# Patient Record
Sex: Female | Born: 1970 | Race: Black or African American | Hispanic: No | Smoking: Never smoker
Health system: Southern US, Community
[De-identification: ages and names within clinical notes are randomized; demographics above are authoritative.]

## PROBLEM LIST (undated history)

## (undated) DIAGNOSIS — Z889 Allergy status to unspecified drugs, medicaments and biological substances status: Secondary | ICD-10-CM

## (undated) DIAGNOSIS — F4310 Post-traumatic stress disorder, unspecified: Secondary | ICD-10-CM

## (undated) DIAGNOSIS — G709 Myoneural disorder, unspecified: Secondary | ICD-10-CM

## (undated) DIAGNOSIS — E785 Hyperlipidemia, unspecified: Secondary | ICD-10-CM

## (undated) DIAGNOSIS — D259 Leiomyoma of uterus, unspecified: Secondary | ICD-10-CM

## (undated) DIAGNOSIS — R011 Cardiac murmur, unspecified: Secondary | ICD-10-CM

## (undated) DIAGNOSIS — G43909 Migraine, unspecified, not intractable, without status migrainosus: Secondary | ICD-10-CM

## (undated) DIAGNOSIS — R569 Unspecified convulsions: Secondary | ICD-10-CM

## (undated) DIAGNOSIS — S322XXA Fracture of coccyx, initial encounter for closed fracture: Secondary | ICD-10-CM

## (undated) DIAGNOSIS — F329 Major depressive disorder, single episode, unspecified: Secondary | ICD-10-CM

## (undated) DIAGNOSIS — F419 Anxiety disorder, unspecified: Secondary | ICD-10-CM

## (undated) DIAGNOSIS — D649 Anemia, unspecified: Secondary | ICD-10-CM

## (undated) DIAGNOSIS — F32A Depression, unspecified: Secondary | ICD-10-CM

## (undated) DIAGNOSIS — N938 Other specified abnormal uterine and vaginal bleeding: Secondary | ICD-10-CM

## (undated) DIAGNOSIS — M549 Dorsalgia, unspecified: Secondary | ICD-10-CM

## (undated) HISTORY — DX: Cardiac murmur, unspecified: R01.1

## (undated) HISTORY — PX: OTHER SURGICAL HISTORY: SHX169

## (undated) HISTORY — DX: Other specified abnormal uterine and vaginal bleeding: N93.8

## (undated) HISTORY — PX: TUBAL LIGATION: SHX77

## (undated) HISTORY — PX: LAPAROTOMY: SHX154

## (undated) HISTORY — DX: Migraine, unspecified, not intractable, without status migrainosus: G43.909

## (undated) HISTORY — DX: Hyperlipidemia, unspecified: E78.5

## (undated) HISTORY — PX: WISDOM TOOTH EXTRACTION: SHX21

## (undated) HISTORY — PX: HERNIA REPAIR: SHX51

## (undated) HISTORY — DX: Leiomyoma of uterus, unspecified: D25.9

## (undated) HISTORY — DX: Depression, unspecified: F32.A

## (undated) HISTORY — PX: CHOLECYSTECTOMY: SHX55

## (undated) HISTORY — DX: Anemia, unspecified: D64.9

## (undated) HISTORY — DX: Major depressive disorder, single episode, unspecified: F32.9

---

## 2002-04-16 DIAGNOSIS — R569 Unspecified convulsions: Secondary | ICD-10-CM

## 2002-04-16 HISTORY — DX: Unspecified convulsions: R56.9

## 2006-10-25 ENCOUNTER — Emergency Department (HOSPITAL_COMMUNITY): Admission: EM | Admit: 2006-10-25 | Discharge: 2006-10-25 | Payer: Self-pay | Admitting: Emergency Medicine

## 2007-01-07 ENCOUNTER — Emergency Department (HOSPITAL_COMMUNITY): Admission: EM | Admit: 2007-01-07 | Discharge: 2007-01-07 | Payer: Self-pay | Admitting: Emergency Medicine

## 2007-01-20 ENCOUNTER — Emergency Department (HOSPITAL_COMMUNITY): Admission: EM | Admit: 2007-01-20 | Discharge: 2007-01-20 | Payer: Self-pay | Admitting: Emergency Medicine

## 2007-02-01 ENCOUNTER — Emergency Department (HOSPITAL_COMMUNITY): Admission: EM | Admit: 2007-02-01 | Discharge: 2007-02-01 | Payer: Self-pay | Admitting: Emergency Medicine

## 2007-05-24 ENCOUNTER — Emergency Department (HOSPITAL_COMMUNITY): Admission: EM | Admit: 2007-05-24 | Discharge: 2007-05-25 | Payer: Self-pay | Admitting: Emergency Medicine

## 2007-05-25 ENCOUNTER — Emergency Department (HOSPITAL_COMMUNITY): Admission: EM | Admit: 2007-05-25 | Discharge: 2007-05-25 | Payer: Self-pay | Admitting: Family Medicine

## 2007-05-31 ENCOUNTER — Emergency Department (HOSPITAL_COMMUNITY): Admission: EM | Admit: 2007-05-31 | Discharge: 2007-05-31 | Payer: Self-pay | Admitting: Emergency Medicine

## 2008-10-22 ENCOUNTER — Emergency Department (HOSPITAL_COMMUNITY): Admission: EM | Admit: 2008-10-22 | Discharge: 2008-10-22 | Payer: Self-pay | Admitting: Emergency Medicine

## 2009-01-17 ENCOUNTER — Emergency Department (HOSPITAL_COMMUNITY): Admission: EM | Admit: 2009-01-17 | Discharge: 2009-01-17 | Payer: Self-pay | Admitting: Family Medicine

## 2009-02-27 ENCOUNTER — Emergency Department (HOSPITAL_COMMUNITY): Admission: EM | Admit: 2009-02-27 | Discharge: 2009-02-27 | Payer: Self-pay | Admitting: Emergency Medicine

## 2009-03-02 ENCOUNTER — Encounter: Admission: RE | Admit: 2009-03-02 | Discharge: 2009-03-02 | Payer: Self-pay | Admitting: Emergency Medicine

## 2009-05-25 ENCOUNTER — Encounter (INDEPENDENT_AMBULATORY_CARE_PROVIDER_SITE_OTHER): Payer: Self-pay | Admitting: Family Medicine

## 2009-05-25 ENCOUNTER — Ambulatory Visit: Payer: Self-pay | Admitting: Internal Medicine

## 2009-05-25 LAB — CONVERTED CEMR LAB
ALT: 11 units/L (ref 0–35)
AST: 14 units/L (ref 0–37)
Albumin: 4.5 g/dL (ref 3.5–5.2)
Alkaline Phosphatase: 51 units/L (ref 39–117)
BUN: 5 mg/dL — ABNORMAL LOW (ref 6–23)
Basophils Absolute: 0 10*3/uL (ref 0.0–0.1)
Basophils Relative: 0 % (ref 0–1)
CO2: 20 meq/L (ref 19–32)
Calcium: 9.4 mg/dL (ref 8.4–10.5)
Chloride: 104 meq/L (ref 96–112)
Cholesterol: 221 mg/dL — ABNORMAL HIGH (ref 0–200)
Creatinine, Ser: 0.72 mg/dL (ref 0.40–1.20)
Eosinophils Absolute: 0 10*3/uL (ref 0.0–0.7)
Eosinophils Relative: 1 % (ref 0–5)
Glucose, Bld: 87 mg/dL (ref 70–99)
HCT: 36.3 % (ref 36.0–46.0)
HDL: 45 mg/dL (ref >39)
Hemoglobin: 11.8 g/dL — ABNORMAL LOW (ref 12.0–15.0)
LDL Cholesterol: 158 mg/dL — ABNORMAL HIGH (ref 0–99)
Lymphocytes Relative: 39 % (ref 12–46)
Lymphs Abs: 1.6 10*3/uL (ref 0.7–4.0)
MCHC: 32.5 g/dL (ref 30.0–36.0)
MCV: 85.8 fL (ref 78.0–100.0)
Monocytes Absolute: 0.3 10*3/uL (ref 0.1–1.0)
Monocytes Relative: 6 % (ref 3–12)
Neutro Abs: 2.1 10*3/uL (ref 1.7–7.7)
Neutrophils Relative %: 54 % (ref 43–77)
Platelets: 293 10*3/uL (ref 150–400)
Potassium: 4.2 meq/L (ref 3.5–5.3)
RBC: 4.23 M/uL (ref 3.87–5.11)
RDW: 13.5 % (ref 11.5–15.5)
Sodium: 139 meq/L (ref 135–145)
Total Bilirubin: 0.4 mg/dL (ref 0.3–1.2)
Total CHOL/HDL Ratio: 4.9
Total Protein: 7.3 g/dL (ref 6.0–8.3)
Triglycerides: 92 mg/dL (ref <150)
VLDL: 18 mg/dL (ref 0–40)
Vit D, 25-Hydroxy: 12 ng/mL — ABNORMAL LOW (ref 30–89)
WBC: 4 10*3/uL (ref 4.0–10.5)

## 2009-06-14 ENCOUNTER — Ambulatory Visit: Payer: Self-pay | Admitting: Internal Medicine

## 2009-07-29 ENCOUNTER — Encounter (INDEPENDENT_AMBULATORY_CARE_PROVIDER_SITE_OTHER): Payer: Self-pay | Admitting: Family Medicine

## 2009-07-29 ENCOUNTER — Other Ambulatory Visit: Admission: RE | Admit: 2009-07-29 | Discharge: 2009-07-29 | Payer: Self-pay | Admitting: Family Medicine

## 2009-07-29 ENCOUNTER — Ambulatory Visit: Payer: Self-pay | Admitting: Internal Medicine

## 2009-07-29 LAB — CONVERTED CEMR LAB
Ferritin: 46 ng/mL (ref 10–291)
Iron: 121 ug/dL (ref 42–145)
Saturation Ratios: 27 % (ref 20–55)
TIBC: 441 ug/dL (ref 250–470)
TSH: 2.104 microintl units/mL (ref 0.350–4.500)
UIBC: 320 ug/dL

## 2009-11-11 ENCOUNTER — Ambulatory Visit: Payer: Self-pay | Admitting: Internal Medicine

## 2010-02-12 ENCOUNTER — Emergency Department (HOSPITAL_COMMUNITY): Admission: EM | Admit: 2010-02-12 | Discharge: 2010-02-12 | Payer: Self-pay | Admitting: Family Medicine

## 2010-03-15 ENCOUNTER — Emergency Department (HOSPITAL_COMMUNITY): Admission: EM | Admit: 2010-03-15 | Discharge: 2010-03-15 | Payer: Self-pay | Admitting: Family Medicine

## 2010-04-05 ENCOUNTER — Emergency Department (HOSPITAL_COMMUNITY)
Admission: EM | Admit: 2010-04-05 | Discharge: 2010-04-05 | Payer: Self-pay | Source: Home / Self Care | Admitting: Emergency Medicine

## 2010-05-02 ENCOUNTER — Emergency Department (HOSPITAL_COMMUNITY)
Admission: EM | Admit: 2010-05-02 | Discharge: 2010-05-02 | Payer: Self-pay | Source: Home / Self Care | Admitting: Emergency Medicine

## 2010-05-03 LAB — POCT URINALYSIS DIPSTICK
Bilirubin Urine: NEGATIVE
Hgb urine dipstick: NEGATIVE
Nitrite: NEGATIVE
Protein, ur: NEGATIVE mg/dL
Specific Gravity, Urine: 1.02 (ref 1.005–1.030)
Urine Glucose, Fasting: NEGATIVE mg/dL
Urobilinogen, UA: 1 mg/dL (ref 0.0–1.0)
pH: 6.5 (ref 5.0–8.0)

## 2010-05-03 LAB — WET PREP, GENITAL
Trich, Wet Prep: NONE SEEN
Yeast Wet Prep HPF POC: NONE SEEN

## 2010-05-03 LAB — POCT PREGNANCY, URINE: Preg Test, Ur: NEGATIVE

## 2010-05-07 ENCOUNTER — Encounter: Payer: Self-pay | Admitting: Emergency Medicine

## 2010-05-08 LAB — GC/CHLAMYDIA PROBE AMP, GENITAL
Chlamydia, DNA Probe: NEGATIVE
GC Probe Amp, Genital: NEGATIVE

## 2010-06-13 ENCOUNTER — Inpatient Hospital Stay (HOSPITAL_COMMUNITY)
Admission: AD | Admit: 2010-06-13 | Discharge: 2010-06-13 | Disposition: A | Discharge status: Home / Self Care | Payer: Medicaid Other | Source: Ambulatory Visit | Attending: Obstetrics & Gynecology | Admitting: Obstetrics & Gynecology

## 2010-06-13 DIAGNOSIS — R109 Unspecified abdominal pain: Secondary | ICD-10-CM | POA: Insufficient documentation

## 2010-06-13 DIAGNOSIS — M545 Low back pain, unspecified: Secondary | ICD-10-CM | POA: Insufficient documentation

## 2010-06-13 LAB — CBC
HCT: 39.1 % (ref 36.0–46.0)
Hemoglobin: 12.6 g/dL (ref 12.0–15.0)
MCH: 27.9 pg (ref 26.0–34.0)
MCHC: 32.2 g/dL (ref 30.0–36.0)
MCV: 86.7 fL (ref 78.0–100.0)
Platelets: 248 10*3/uL (ref 150–400)
RBC: 4.51 MIL/uL (ref 3.87–5.11)
RDW: 13.5 % (ref 11.5–15.5)
WBC: 6.2 10*3/uL (ref 4.0–10.5)

## 2010-06-13 LAB — WET PREP, GENITAL
Clue Cells Wet Prep HPF POC: NONE SEEN
Trich, Wet Prep: NONE SEEN
Yeast Wet Prep HPF POC: NONE SEEN

## 2010-06-13 LAB — ABO/RH: ABO/RH(D): O POS

## 2010-06-13 LAB — URINALYSIS, ROUTINE W REFLEX MICROSCOPIC
Bilirubin Urine: NEGATIVE
Hgb urine dipstick: NEGATIVE
Ketones, ur: NEGATIVE mg/dL
Nitrite: NEGATIVE
Protein, ur: NEGATIVE mg/dL
Specific Gravity, Urine: 1.02 (ref 1.005–1.030)
Urine Glucose, Fasting: NEGATIVE mg/dL
Urobilinogen, UA: 0.2 mg/dL (ref 0.0–1.0)
pH: 8 (ref 5.0–8.0)

## 2010-06-13 LAB — HCG, QUANTITATIVE, PREGNANCY: hCG, Beta Chain, Quant, S: <2 m[IU]/mL (ref <5)

## 2010-06-13 LAB — POCT PREGNANCY, URINE: Preg Test, Ur: NEGATIVE

## 2010-06-14 LAB — GC/CHLAMYDIA PROBE AMP, GENITAL
Chlamydia, DNA Probe: NEGATIVE
GC Probe Amp, Genital: NEGATIVE

## 2010-06-26 ENCOUNTER — Inpatient Hospital Stay (HOSPITAL_COMMUNITY): Payer: Medicaid Other

## 2010-06-26 ENCOUNTER — Inpatient Hospital Stay (HOSPITAL_COMMUNITY)
Admission: AD | Admit: 2010-06-26 | Discharge: 2010-06-26 | Disposition: A | Discharge status: Home / Self Care | Payer: Medicaid Other | Source: Ambulatory Visit | Attending: Family Medicine | Admitting: Family Medicine

## 2010-06-26 DIAGNOSIS — N390 Urinary tract infection, site not specified: Secondary | ICD-10-CM | POA: Insufficient documentation

## 2010-06-26 DIAGNOSIS — N938 Other specified abnormal uterine and vaginal bleeding: Secondary | ICD-10-CM

## 2010-06-26 DIAGNOSIS — N838 Other noninflammatory disorders of ovary, fallopian tube and broad ligament: Secondary | ICD-10-CM | POA: Insufficient documentation

## 2010-06-26 DIAGNOSIS — N949 Unspecified condition associated with female genital organs and menstrual cycle: Secondary | ICD-10-CM

## 2010-06-26 LAB — URINALYSIS, ROUTINE W REFLEX MICROSCOPIC
Bilirubin Urine: NEGATIVE
Bilirubin Urine: NEGATIVE
Glucose, UA: 100 mg/dL — AB
Glucose, UA: NEGATIVE mg/dL
Ketones, ur: 15 mg/dL — AB
Ketones, ur: NEGATIVE mg/dL
Leukocytes, UA: NEGATIVE
Nitrite: NEGATIVE
Nitrite: POSITIVE — AB
Protein, ur: >300 mg/dL — AB
Protein, ur: NEGATIVE mg/dL
Specific Gravity, Urine: 1.011 (ref 1.005–1.030)
Specific Gravity, Urine: 1.015 (ref 1.005–1.030)
Urobilinogen, UA: 0.2 mg/dL (ref 0.0–1.0)
Urobilinogen, UA: 4 mg/dL — ABNORMAL HIGH (ref 0.0–1.0)
pH: 7 (ref 5.0–8.0)
pH: 7.5 (ref 5.0–8.0)

## 2010-06-26 LAB — URINE MICROSCOPIC-ADD ON

## 2010-06-26 LAB — HCG, QUANTITATIVE, PREGNANCY: hCG, Beta Chain, Quant, S: <2 m[IU]/mL (ref <5)

## 2010-06-26 LAB — POCT PREGNANCY, URINE
Preg Test, Ur: NEGATIVE
Preg Test, Ur: NEGATIVE

## 2010-06-27 LAB — POCT URINALYSIS DIPSTICK
Bilirubin Urine: NEGATIVE
Glucose, UA: NEGATIVE mg/dL
Ketones, ur: NEGATIVE mg/dL
Nitrite: NEGATIVE
Protein, ur: NEGATIVE mg/dL
Specific Gravity, Urine: 1.02 (ref 1.005–1.030)
Urobilinogen, UA: 0.2 mg/dL (ref 0.0–1.0)
pH: 5.5 (ref 5.0–8.0)

## 2010-06-27 LAB — GC/CHLAMYDIA PROBE AMP, GENITAL
Chlamydia, DNA Probe: NEGATIVE
GC Probe Amp, Genital: NEGATIVE

## 2010-06-27 LAB — WET PREP, GENITAL
Clue Cells Wet Prep HPF POC: NONE SEEN
Trich, Wet Prep: NONE SEEN
Yeast Wet Prep HPF POC: NONE SEEN

## 2010-06-27 LAB — POCT PREGNANCY, URINE: Preg Test, Ur: NEGATIVE

## 2010-07-19 ENCOUNTER — Encounter: Payer: Medicaid Other | Admitting: Obstetrics and Gynecology

## 2010-07-19 LAB — POCT PREGNANCY, URINE: Preg Test, Ur: NEGATIVE

## 2010-07-23 LAB — POCT URINALYSIS DIP (DEVICE)
Bilirubin Urine: NEGATIVE
Glucose, UA: NEGATIVE mg/dL
Ketones, ur: NEGATIVE mg/dL
Nitrite: NEGATIVE
Protein, ur: NEGATIVE mg/dL
Specific Gravity, Urine: 1.01 (ref 1.005–1.030)
Urobilinogen, UA: 0.2 mg/dL (ref 0.0–1.0)
pH: 6 (ref 5.0–8.0)

## 2010-07-23 LAB — URINE CULTURE: Colony Count: >=100000

## 2010-08-03 ENCOUNTER — Encounter: Payer: Medicaid Other | Admitting: Obstetrics and Gynecology

## 2010-09-21 ENCOUNTER — Encounter: Payer: Medicaid Other | Admitting: Physician Assistant

## 2010-10-30 ENCOUNTER — Inpatient Hospital Stay (INDEPENDENT_AMBULATORY_CARE_PROVIDER_SITE_OTHER)
Admission: RE | Admit: 2010-10-30 | Discharge: 2010-10-30 | Disposition: A | Discharge status: Home / Self Care | Payer: Medicaid Other | Source: Ambulatory Visit | Attending: Family Medicine | Admitting: Family Medicine

## 2010-10-30 DIAGNOSIS — N83209 Unspecified ovarian cyst, unspecified side: Secondary | ICD-10-CM

## 2010-10-30 LAB — POCT URINALYSIS DIP (DEVICE)
Bilirubin Urine: NEGATIVE
Glucose, UA: NEGATIVE mg/dL
Hgb urine dipstick: NEGATIVE
Ketones, ur: NEGATIVE mg/dL
Leukocytes, UA: NEGATIVE
Nitrite: NEGATIVE
Protein, ur: NEGATIVE mg/dL
Specific Gravity, Urine: 1.02 (ref 1.005–1.030)
Urobilinogen, UA: 1 mg/dL (ref 0.0–1.0)
pH: 6.5 (ref 5.0–8.0)

## 2010-10-30 LAB — POCT PREGNANCY, URINE: Preg Test, Ur: NEGATIVE

## 2010-12-01 ENCOUNTER — Encounter: Payer: Self-pay | Admitting: Obstetrics & Gynecology

## 2010-12-01 ENCOUNTER — Ambulatory Visit (INDEPENDENT_AMBULATORY_CARE_PROVIDER_SITE_OTHER): Payer: Medicaid Other | Admitting: Obstetrics & Gynecology

## 2010-12-01 VITALS — BP 124/80 | HR 91 | Temp 98.4°F | Ht 71.5 in | Wt 186.2 lb

## 2010-12-01 DIAGNOSIS — N949 Unspecified condition associated with female genital organs and menstrual cycle: Secondary | ICD-10-CM

## 2010-12-01 DIAGNOSIS — N83299 Other ovarian cyst, unspecified side: Secondary | ICD-10-CM

## 2010-12-01 DIAGNOSIS — N83209 Unspecified ovarian cyst, unspecified side: Secondary | ICD-10-CM

## 2010-12-01 DIAGNOSIS — N938 Other specified abnormal uterine and vaginal bleeding: Secondary | ICD-10-CM

## 2010-12-01 DIAGNOSIS — R102 Pelvic and perineal pain: Secondary | ICD-10-CM | POA: Insufficient documentation

## 2010-12-01 HISTORY — DX: Other specified abnormal uterine and vaginal bleeding: N93.8

## 2010-12-01 LAB — CBC
HCT: 37.5 % (ref 36.0–46.0)
Hemoglobin: 12.6 g/dL (ref 12.0–15.0)
MCH: 28.7 pg (ref 26.0–34.0)
MCHC: 33.6 g/dL (ref 30.0–36.0)
MCV: 85.4 fL (ref 78.0–100.0)
Platelets: 265 10*3/uL (ref 150–400)
RBC: 4.39 MIL/uL (ref 3.87–5.11)
RDW: 13.7 % (ref 11.5–15.5)
WBC: 6.2 10*3/uL (ref 4.0–10.5)

## 2010-12-01 MED ORDER — IBUPROFEN 600 MG PO TABS: 600.0000 mg | ORAL_TABLET | Freq: Four times a day (QID) | ORAL | Status: AC | PRN

## 2010-12-01 NOTE — Patient Instructions (Signed)
It was nice to meet you.  I have sent a prescription strength ibuprofen to your pharmacy to help with your pain.  We will schedule you for an [place], and I want you to make an appointment for three weeks for follow up and an endometrial biopsy.

## 2010-12-01 NOTE — Progress Notes (Signed)
Pt does not have list of meds with her. Will call back with name of meds.

## 2010-12-01 NOTE — Progress Notes (Signed)
Subjective:     Christine Vega is an 40 y.o. woman who presents for pelvic pain and irregular menses. She had been bleeding regularly, but with spotting in between her periods. She is now bleeding every 28 days and menses are lasting 5 days.  Dysmenorrhea:severe, occurring throughout menses and throughout cycle.  Pain is worst during cycle. Cyclic symptoms include: bloating, breast tenderness and depression. Current contraception: tubal ligation. History of infertility: no. History of abnormal Pap smear: no.  Patient seen in [facility] in March with pain, had [place] that showed complex cyst.  Pain has continued and she was seen again in [facility].   Patient has history of endometriosis, had endometrial biopsy in 2010, she says it showed no sign of cancer.  She had to vaginal deliveries, the first NSVD (22 years ago), second one was forceps assisted vaginal delivery (9 years ago).   Menstrual History: OB History    Grav Para Term Preterm Abortions TAB SAB Ect Mult Living   2 2 2  0 0 0 0 0 0 2       Patient's last menstrual period was 11/30/2010.    The following portions of the patient's history were reviewed and updated as appropriate: allergies, current medications, past family history, past medical history, past social history, past surgical history and problem list.  Review of Systems Pertinent items are noted in HPI.    Objective:    BP 124/80  Pulse 91  Temp(Src) 98.4 F (36.9 C) (Oral)  Ht 5' 11.5" (1.816 m)  Wt 186 lb 3.2 oz (84.46 kg)  BMI 25.61 kg/m2  LMP 11/30/2010  General:   alert, cooperative and no distress  Skin:    normal  Neck:  no adenopathy, supple, symmetrical, trachea midline and thyroid not enlarged, symmetric, no tenderness/mass/nodules  Abdomen:  soft, non-tender; bowel sounds normal; no masses,  no organomegaly  Pelvic:   cervix normal in appearance, external genitalia normal, no adnexal masses or tenderness, no cervical motion tenderness, rectovaginal septum normal,  uterus normal size, shape, and consistency, vagina normal without discharge and moderate blood in vaginal vault, coming from os.      Assessment:    dysfunctional uterine bleeding    Plan:    Pelvic ultrasound.  Will check CBC and CA 125.  Follow up in 3 weeks for endometrial biopsy. Will attempt to get records from [place], hospital where patient had tubal ligation and endometrial biopsy.

## 2010-12-02 LAB — CA 125: CA 125: 23.6 U/mL (ref 0.0–30.2)

## 2010-12-04 ENCOUNTER — Telehealth: Payer: Self-pay | Admitting: *Deleted

## 2010-12-04 NOTE — Telephone Encounter (Signed)
[facility] called to get PA # for [place]: [id number] .Marland KitchenLoralee Pacas [place]

## 2010-12-05 ENCOUNTER — Ambulatory Visit (HOSPITAL_COMMUNITY): Admission: RE | Admit: 2010-12-05 | Payer: Medicaid Other | Source: Ambulatory Visit

## 2010-12-11 ENCOUNTER — Ambulatory Visit (HOSPITAL_COMMUNITY): Admission: RE | Admit: 2010-12-11 | Payer: Medicaid Other | Source: Ambulatory Visit

## 2010-12-14 ENCOUNTER — Ambulatory Visit (HOSPITAL_COMMUNITY)
Admission: RE | Admit: 2010-12-14 | Discharge: 2010-12-14 | Disposition: A | Discharge status: Home / Self Care | Payer: Medicaid Other | Source: Ambulatory Visit | Attending: Obstetrics & Gynecology | Admitting: Obstetrics & Gynecology

## 2010-12-14 DIAGNOSIS — R102 Pelvic and perineal pain: Secondary | ICD-10-CM

## 2010-12-14 DIAGNOSIS — N949 Unspecified condition associated with female genital organs and menstrual cycle: Secondary | ICD-10-CM | POA: Insufficient documentation

## 2010-12-14 DIAGNOSIS — N938 Other specified abnormal uterine and vaginal bleeding: Secondary | ICD-10-CM | POA: Insufficient documentation

## 2010-12-27 ENCOUNTER — Other Ambulatory Visit: Payer: Self-pay | Admitting: Family Medicine

## 2010-12-27 ENCOUNTER — Ambulatory Visit
Admission: RE | Admit: 2010-12-27 | Discharge: 2010-12-27 | Disposition: A | Discharge status: Home / Self Care | Payer: Medicaid Other | Source: Ambulatory Visit | Attending: Family Medicine | Admitting: Family Medicine

## 2010-12-27 DIAGNOSIS — M549 Dorsalgia, unspecified: Secondary | ICD-10-CM

## 2011-01-05 LAB — POCT PREGNANCY, URINE
Operator id: 239701
Preg Test, Ur: NEGATIVE

## 2011-01-05 LAB — WET PREP, GENITAL
Clue Cells Wet Prep HPF POC: NONE SEEN
Trich, Wet Prep: NONE SEEN
WBC, Wet Prep HPF POC: NONE SEEN
Yeast Wet Prep HPF POC: NONE SEEN

## 2011-01-05 LAB — POCT URINALYSIS DIP (DEVICE)
Bilirubin Urine: NEGATIVE
Glucose, UA: NEGATIVE
Hgb urine dipstick: NEGATIVE
Ketones, ur: NEGATIVE
Nitrite: NEGATIVE
Operator id: 239701
Protein, ur: NEGATIVE
Specific Gravity, Urine: 1.025
Urobilinogen, UA: 1
pH: 6

## 2011-01-05 LAB — GC/CHLAMYDIA PROBE AMP, GENITAL
Chlamydia, DNA Probe: NEGATIVE
GC Probe Amp, Genital: NEGATIVE

## 2011-01-24 ENCOUNTER — Ambulatory Visit: Payer: Medicaid Other | Admitting: Obstetrics & Gynecology

## 2011-01-24 ENCOUNTER — Telehealth: Payer: Self-pay | Admitting: Obstetrics and Gynecology

## 2011-01-24 ENCOUNTER — Telehealth: Payer: Self-pay | Admitting: *Deleted

## 2011-01-24 NOTE — Telephone Encounter (Signed)
Patient called to cancel appointment because of no ride. Request if she can get result over the phone instead. Per Dr. Natale Milch reported to patient that ultrasound found cyst and that she needs further testing (endometrial biopsy and MRI). Patient states she will try again to find a ride to come here today for her appointment, and if not she will call back to re-schedule.

## 2011-01-24 NOTE — Telephone Encounter (Signed)
Pt left message stating that she wanted to come for her appt today but was unable. She has re-scheduled to 03/15/11. She is requesting for the manager to see if she can get an appt sooner than 11/29. She states she really needs to come in sooner. Message will be routed to Mayra Neer- clinic manager.

## 2011-01-30 LAB — WET PREP, GENITAL
Clue Cells Wet Prep HPF POC: NONE SEEN
Trich, Wet Prep: NONE SEEN
WBC, Wet Prep HPF POC: NONE SEEN
Yeast Wet Prep HPF POC: NONE SEEN

## 2011-01-30 LAB — GC/CHLAMYDIA PROBE AMP, GENITAL
Chlamydia, DNA Probe: NEGATIVE
GC Probe Amp, Genital: NEGATIVE

## 2011-01-30 LAB — URINALYSIS, ROUTINE W REFLEX MICROSCOPIC
Glucose, UA: NEGATIVE
Ketones, ur: 15 — AB
Nitrite: NEGATIVE
Protein, ur: >300 — AB
Specific Gravity, Urine: 1.028
Urobilinogen, UA: 1
pH: 6

## 2011-01-30 LAB — URINE MICROSCOPIC-ADD ON

## 2011-01-30 LAB — POCT PREGNANCY, URINE
Operator id: 161631
Preg Test, Ur: NEGATIVE

## 2011-01-30 LAB — RPR: RPR Ser Ql: NONREACTIVE

## 2011-02-02 ENCOUNTER — Ambulatory Visit: Payer: Self-pay | Admitting: Obstetrics and Gynecology

## 2011-02-06 ENCOUNTER — Other Ambulatory Visit: Payer: Self-pay | Admitting: *Deleted

## 2011-02-06 ENCOUNTER — Other Ambulatory Visit: Payer: Self-pay | Admitting: Family Medicine

## 2011-02-06 DIAGNOSIS — Z1231 Encounter for screening mammogram for malignant neoplasm of breast: Secondary | ICD-10-CM

## 2011-02-26 ENCOUNTER — Ambulatory Visit
Admission: RE | Admit: 2011-02-26 | Discharge: 2011-02-26 | Disposition: A | Discharge status: Home / Self Care | Payer: Medicaid Other | Source: Ambulatory Visit | Attending: *Deleted | Admitting: *Deleted

## 2011-02-26 DIAGNOSIS — Z1231 Encounter for screening mammogram for malignant neoplasm of breast: Secondary | ICD-10-CM

## 2011-03-13 ENCOUNTER — Ambulatory Visit: Payer: Medicaid Other | Attending: Anesthesiology

## 2011-03-13 DIAGNOSIS — R5381 Other malaise: Secondary | ICD-10-CM | POA: Insufficient documentation

## 2011-03-13 DIAGNOSIS — M2560 Stiffness of unspecified joint, not elsewhere classified: Secondary | ICD-10-CM | POA: Insufficient documentation

## 2011-03-13 DIAGNOSIS — M2550 Pain in unspecified joint: Secondary | ICD-10-CM | POA: Insufficient documentation

## 2011-03-13 DIAGNOSIS — IMO0001 Reserved for inherently not codable concepts without codable children: Secondary | ICD-10-CM | POA: Insufficient documentation

## 2011-03-15 ENCOUNTER — Ambulatory Visit: Payer: Medicaid Other | Admitting: Obstetrics & Gynecology

## 2011-03-15 ENCOUNTER — Ambulatory Visit: Payer: Self-pay | Admitting: Obstetrics & Gynecology

## 2011-03-21 ENCOUNTER — Ambulatory Visit: Payer: Medicaid Other | Attending: Anesthesiology | Admitting: Physical Therapy

## 2011-03-21 DIAGNOSIS — R5381 Other malaise: Secondary | ICD-10-CM | POA: Insufficient documentation

## 2011-03-21 DIAGNOSIS — M2560 Stiffness of unspecified joint, not elsewhere classified: Secondary | ICD-10-CM | POA: Insufficient documentation

## 2011-03-21 DIAGNOSIS — IMO0001 Reserved for inherently not codable concepts without codable children: Secondary | ICD-10-CM | POA: Insufficient documentation

## 2011-03-21 DIAGNOSIS — M2550 Pain in unspecified joint: Secondary | ICD-10-CM | POA: Insufficient documentation

## 2011-03-26 ENCOUNTER — Ambulatory Visit: Payer: Medicaid Other | Admitting: Physical Therapy

## 2011-03-28 ENCOUNTER — Encounter: Payer: Medicaid Other | Admitting: Physical Therapy

## 2011-04-04 ENCOUNTER — Ambulatory Visit: Payer: Medicaid Other | Admitting: Rehabilitative and Restorative Service Providers"

## 2011-04-09 ENCOUNTER — Ambulatory Visit: Payer: Medicaid Other | Admitting: Physical Therapy

## 2011-04-13 ENCOUNTER — Ambulatory Visit (INDEPENDENT_AMBULATORY_CARE_PROVIDER_SITE_OTHER): Payer: Medicaid Other | Admitting: Obstetrics & Gynecology

## 2011-04-13 ENCOUNTER — Encounter: Payer: Self-pay | Admitting: Obstetrics & Gynecology

## 2011-04-13 VITALS — BP 120/84 | HR 99 | Temp 98.9°F | Ht 71.5 in | Wt 198.5 lb

## 2011-04-13 DIAGNOSIS — Z23 Encounter for immunization: Secondary | ICD-10-CM

## 2011-04-13 DIAGNOSIS — B373 Candidiasis of vulva and vagina: Secondary | ICD-10-CM

## 2011-04-13 DIAGNOSIS — B3731 Acute candidiasis of vulva and vagina: Secondary | ICD-10-CM

## 2011-04-13 MED ORDER — INFLUENZA VIRUS VACC SPLIT PF IM SUSP
0.5000 mL | Freq: Once | INTRAMUSCULAR | Status: AC
  Administered 2011-04-13: 0.5 mL via INTRAMUSCULAR

## 2011-04-13 MED ORDER — FLUCONAZOLE 100 MG PO TABS: 150.0000 mg | ORAL_TABLET | Freq: Once | ORAL | Status: AC

## 2011-04-13 NOTE — Progress Notes (Signed)
Subjective:    Patient ID: Christine Vega, female    DOB: 05-02-1970, 40 y.o.   MRN: [id number]  HPI Patient's last menstrual period was 03/23/2011. [id number] The patient comes today in followup after an ultrasound was repeated in August. She had an ultrasound in March that showed a right ovarian cystic lesion about 5 cm. Ultrasound in August 30 confirmed a complex right ovarian cyst 6.1 x 5.2 cm which is suspicious for a dermoid. Patient has occasional pain but her menstrual cycles are regular and not heavy or bothersome. Ultrasound showed a normal trilaminar endometrium. Offered laparoscopy and removal of the right ovarian cyst. She understands this might require removal of the ovary. The risk of a laparotomy was also discussed. Anesthesia complications and the risks of bleeding infection and abdominal organ damage was also discussed her questions were answered. She would like to have this procedure scheduled. Past Medical History  Diagnosis Date  . Migraines   . Anemia   . Depression   . Endometriosis   . Hyperlipidemia    Past Surgical History  Procedure Date  . Cholecystectomy   . Hernia repair   . Tubal ligation    laparoscopy performed and it was the city [place] in 2010 with a diagnosis of endometriosis. She states that she received no additional therapy after surgery. Do not have records of that procedure. Current Outpatient Prescriptions on File Prior to Visit  Medication Sig Dispense Refill  . divalproex (DEPAKOTE) 500 MG 24 hr tablet Take 500 mg by mouth 2 (two) times daily.         No current facility-administered medications on file prior to visit.   Allergies  Allergen Reactions  . Zithromax (Azithromycin) Itching   History   Social History  . Marital Status: Legally Separated    Spouse Name: N/A    Number of Children: N/A  . Years of Education: N/A   Occupational History  . Not on file.   Social History Main Topics  . Smoking status: Never Smoker   .  Smokeless tobacco: Never Used  . Alcohol Use: No  . Drug Use: No  . Sexually Active: Yes    Birth Control/ Protection: Surgical   Other Topics Concern  . Not on file   Social History Narrative  . No narrative on file   No family history on file.       Review of Systems No vaginal bleeding vaginal discharge or pain reported today.    Objective:   Physical Exam  Filed Vitals:   04/13/11 1024  Height: 5' 11.5" (1.816 m)  Weight: 198 lb 8 oz (90.039 kg)   Filed Vitals:   04/13/11 1024  BP: 120/84  Pulse: 99  Temp: 98.9 F (37.2 C)  TempSrc: Oral  Height: 5' 11.5" (1.816 m)  Weight: 198 lb 8 oz (90.039 kg)   No acute distress pleasant affect Physical exam was deferred today.   Pap smear in April 2011 was normal     *RADIOLOGY REPORT*  Clinical Data: Dysfunctional uterine bleeding and pelvic pain. LMP  11/29/2010  TRANSVAGINAL ULTRASOUND OF PELVIS  Technique: Transvaginal ultrasound examination of the pelvis was  performed including evaluation of the uterus, ovaries, adnexal  regions, and pelvic cul-de-sac.  Comparison: 06/26/2010  Findings:  Uterus: The uterus demonstrates a sagittal length of 10.9 cm, an  AP depth of 5.5 cm and a transverse width of 6.1 cm. A homogeneous  myometrium is noted.  Endometrium: Measures 1.1 cm in diameter and  has a tri- layered  appearance compatible with a periovulatory endometrial stripe and  corresponding with the patient's given LMP of 11/29/2010  Right ovary: The right ovary measures 3.1 x 2.4 by 3.5 cm and  contains a dominant follicle. Directly adjacent to and contiguous  with the right ovary is a complex cystic lesion measuring 6.1 x 5.2  x 6.0 cm. This contains echogenic soft tissue and linear internal  echoes. No evidence for associated intralesional flow is seen.  This is similar in appearance to the prior exam but the overall  size is slightly larger. The appearance is suspicious for but not  confirmatory for an  ovarian dermoid as it is not clearly ovarian  and may be extraovarian.  Left ovary: Has a normal appearance measuring 3.5 x 2.1 x 2.5 cm.  Other Findings: A small amount of simple free fluid is identified  adjacent to the right ovary  IMPRESSION:  Persistent complex right adnexal mass adjacentto the right ovary.  This may be separate from the right ovary or exophytic from the  right ovary. Imaging characteristics are suspicious for a dermoid  but there has been some interval increase in size and as this  appearance is not confirmatory for that diagnosis, further  evaluation with pelvic MRI with contrast is recommended.  Original Report Authenticated By: Bertha Stakes, M.D.       Assessment & Plan:  Complex right ovarian cyst consistent with a possible dermoid cyst. CA 125 performed in August was normal. She'll be scheduled for diagnostic laparoscopy and removal of right ovarian cyst possible right oophorectomy. Scheryl Darter MD 04/05/2011

## 2011-04-13 NOTE — Patient Instructions (Signed)
Ovarian Cyst The ovaries are small organs that are on each side of the uterus. The ovaries are the organs that produce the female hormones, estrogen and progesterone. An ovarian cyst is a sac filled with fluid that can vary in its size. It is normal for a small cyst to form in women who are in the childbearing age and who have menstrual periods. This type of cyst is called a follicle cyst that becomes an ovulation cyst (corpus luteum cyst) after it produces the women's egg. It later goes away on its own if the woman does not become pregnant. There are other kinds of ovarian cysts that may cause problems and may need to be treated. The most serious problem is a cyst with cancer. It should be noted that menopausal women who have an ovarian cyst are at a higher risk of it being a cancer cyst. They should be evaluated very quickly, thoroughly and followed closely. This is especially true in menopausal women because of the high rate of ovarian cancer in women in menopause. CAUSES AND TYPES OF OVARIAN CYSTS:  FUNCTIONAL CYST: The follicle/corpus luteum cyst is a functional cyst that occurs every month during ovulation with the menstrual cycle. They go away with the next menstrual cycle if the woman does not get pregnant. Usually, there are no symptoms with a functional cyst.   ENDOMETRIOMA CYST: This cyst develops from the lining of the uterus tissue. This cyst gets in or on the ovary. It grows every month from the bleeding during the menstrual period. It is also called a "chocolate cyst" because it becomes filled with blood that turns brown. This cyst can cause pain in the lower abdomen during intercourse and with your menstrual period.   CYSTADENOMA CYST: This cyst develops from the cells on the outside of the ovary. They usually are not cancerous. They can get very big and cause lower abdomen pain and pain with intercourse. This type of cyst can twist on itself, cut off its blood supply and cause severe pain.  It also can easily rupture and cause a lot of pain.   DERMOID CYST: This type of cyst is sometimes found in both ovaries. They are found to have different kinds of body tissue in the cyst. The tissue includes skin, teeth, hair, and/or cartilage. They usually do not have symptoms unless they get very big. Dermoid cysts are rarely cancerous.   POLYCYSTIC OVARY: This is a rare condition with hormone problems that produces many small cysts on both ovaries. The cysts are follicle-like cysts that never produce an egg and become a corpus luteum. It can cause an increase in body weight, infertility, acne, increase in body and facial hair and lack of menstrual periods or rare menstrual periods. Many women with this problem develop type 2 diabetes. The exact cause of this problem is unknown. A polycystic ovary is rarely cancerous.   THECA LUTEIN CYST: Occurs when too much hormone (human chorionic gonadotropin) is produced and over-stimulates the ovaries to produce an egg. They are frequently seen when doctors stimulate the ovaries for invitro-fertilization (test tube babies).   LUTEOMA CYST: This cyst is seen during pregnancy. Rarely it can cause an obstruction to the birth canal during labor and delivery. They usually go away after delivery.  SYMPTOMS   Pelvic pain or pressure.   Pain during sexual intercourse.   Increasing girth (swelling) of the abdomen.   Abnormal menstrual periods.   Increasing pain with menstrual periods.   You stop having   menstrual periods and you are not pregnant.  DIAGNOSIS  The diagnosis can be made during:  Routine or annual pelvic examination (common).   Ultrasound.   X-ray of the pelvis.   CT Scan.   MRI.   Blood tests.  TREATMENT   Treatment may only be to follow the cyst monthly for 2 to 3 months with your caregiver. Many go away on their own, especially functional cysts.   May be aspirated (drained) with a long needle with ultrasound, or by laparoscopy  (inserting a tube into the pelvis through a small incision).   The whole cyst can be removed by laparoscopy.   Sometimes the cyst may need to be removed through an incision in the lower abdomen.   Hormone treatment is sometimes used to help dissolve certain cysts.   Birth control pills are sometimes used to help dissolve certain cysts.  HOME CARE INSTRUCTIONS  Follow your caregiver's advice regarding:  Medicine.   Follow up visits to evaluate and treat the cyst.   You may need to come back or make an appointment with another caregiver, to find the exact cause of your cyst, if your caregiver is not a gynecologist.   Get your yearly and recommended pelvic examinations and Pap tests.   Let your caregiver know if you have had an ovarian cyst in the past.  SEEK MEDICAL CARE IF:   Your periods are late, irregular, they stop, or are painful.   Your stomach (abdomen) or pelvic pain does not go away.   Your stomach becomes larger or swollen.   You have pressure on your bladder or trouble emptying your bladder completely.   You have painful sexual intercourse.   You have feelings of fullness, pressure, or discomfort in your stomach.   You lose weight for no apparent reason.   You feel generally ill.   You become constipated.   You lose your appetite.   You develop acne.   You have an increase in body and facial hair.   You are gaining weight, without changing your exercise and eating habits.   You think you are pregnant.  SEEK IMMEDIATE MEDICAL CARE IF:   You have increasing abdominal pain.   You feel sick to your stomach (nausea) and/or vomit.   You develop a fever that comes on suddenly.   You develop abdominal pain during a bowel movement.   Your menstrual periods become heavier than usual.  Document Released: 04/02/2005 Document Revised: 12/13/2010 Document Reviewed: 02/03/2009 [employer] Patient Information 2012 [employer]. 

## 2011-04-13 NOTE — Progress Notes (Signed)
Addended by: Lynnell Dike on: 04/13/2011 11:47 AM   Modules accepted: Orders

## 2011-04-13 NOTE — Progress Notes (Signed)
Flu info given to pt

## 2011-04-18 ENCOUNTER — Other Ambulatory Visit: Payer: Self-pay | Admitting: Obstetrics & Gynecology

## 2011-05-04 NOTE — Patient Instructions (Signed)
   Your procedure is scheduled on: Wednesday, Jan 30th  Enter through the [employer] of [facility] at: 8am Pick up the phone at the desk and dial [phone] and inform us of your arrival.  Please call this number if you have any problems the morning of surgery: [phone]  Remember: Do not eat food after midnight: Tuesday Do not drink clear liquids after: Tuesday Take these medicines the morning of surgery with a SIP OF WATER:  Do not wear jewelry, make-up, or FINGER nail polish Do not wear lotions, powders, perfumes or deodorant. Do not shave 48 hours prior to surgery. Do not bring valuables to the hospital.  Leave suitcase in the car. After Surgery it may be brought to your room. For patients being admitted to the hospital, checkout time is 11:00am the day of discharge.  Patients discharged on the day of surgery will not be allowed to drive home.     Remember to use your hibiclens as instructed.Please shower with 1/2 bottle the evening before your surgery and the other 1/2 bottle the morning of surgery.

## 2011-05-06 ENCOUNTER — Encounter (HOSPITAL_COMMUNITY): Payer: Self-pay | Admitting: Pharmacist

## 2011-05-07 ENCOUNTER — Inpatient Hospital Stay (HOSPITAL_COMMUNITY): Admission: RE | Admit: 2011-05-07 | Discharge: 2011-05-07 | Payer: Medicaid Other | Source: Ambulatory Visit

## 2011-05-07 ENCOUNTER — Other Ambulatory Visit: Payer: Self-pay | Admitting: Obstetrics & Gynecology

## 2011-05-14 ENCOUNTER — Encounter (HOSPITAL_COMMUNITY): Payer: Self-pay

## 2011-05-14 ENCOUNTER — Inpatient Hospital Stay (HOSPITAL_COMMUNITY): Admission: RE | Admit: 2011-05-14 | Discharge: 2011-05-14 | Payer: Medicaid Other | Source: Ambulatory Visit

## 2011-05-14 ENCOUNTER — Encounter (HOSPITAL_COMMUNITY)
Admission: RE | Admit: 2011-05-14 | Discharge: 2011-05-14 | Disposition: A | Discharge status: Home / Self Care | Payer: Medicaid Other | Source: Ambulatory Visit | Attending: Obstetrics & Gynecology | Admitting: Obstetrics & Gynecology

## 2011-05-14 HISTORY — DX: Myoneural disorder, unspecified: G70.9

## 2011-05-14 HISTORY — DX: Unspecified convulsions: R56.9

## 2011-05-14 HISTORY — DX: Anxiety disorder, unspecified: F41.9

## 2011-05-14 HISTORY — DX: Cardiac murmur, unspecified: R01.1

## 2011-05-14 HISTORY — DX: Dorsalgia, unspecified: M54.9

## 2011-05-14 HISTORY — DX: Allergy status to unspecified drugs, medicaments and biological substances: Z88.9

## 2011-05-14 LAB — CBC
HCT: 36.2 % (ref 36.0–46.0)
Hemoglobin: 11.6 g/dL — ABNORMAL LOW (ref 12.0–15.0)
MCH: 27.6 pg (ref 26.0–34.0)
MCHC: 32 g/dL (ref 30.0–36.0)
MCV: 86.2 fL (ref 78.0–100.0)
Platelets: 240 10*3/uL (ref 150–400)
RBC: 4.2 MIL/uL (ref 3.87–5.11)
RDW: 13.4 % (ref 11.5–15.5)
WBC: 6.7 10*3/uL (ref 4.0–10.5)

## 2011-05-14 LAB — SURGICAL PCR SCREEN
MRSA, PCR: NEGATIVE
Staphylococcus aureus: NEGATIVE

## 2011-05-14 NOTE — Patient Instructions (Signed)
   Your procedure is scheduled on: Wednesday, Jan 30th  Enter through the Main Entrance of [facility] at: 8am Pick up the phone at the desk and dial [phone] and inform us of your arrival.  Please call this number if you have any problems the morning of surgery: [phone]  Remember: Do not eat food after midnight: Tuesday Do not drink clear liquids after: Tuesday Take these medicines the morning of surgery with a SIP OF WATER:  Do not wear jewelry, make-up, or FINGER nail polish Do not wear lotions, powders, perfumes or deodorant. Do not shave 48 hours prior to surgery. Do not bring valuables to the hospital.  Leave suitcase in the car. After Surgery it may be brought to your room. For patients being admitted to the hospital, checkout time is 11:00am the day of discharge.  Patients discharged on the day of surgery will not be allowed to drive home.     Remember to use your hibiclens as instructed.Please shower with 1/2 bottle the evening before your surgery and the other 1/2 bottle the morning of surgery.   

## 2011-05-14 NOTE — Patient Instructions (Signed)
   Your procedure is scheduled on: Wednesday, Jan 30th   Enter through the [employer] of [facility] at: 8am Pick up the phone at the desk and dial [phone] and inform us of your arrival.  Please call this number if you have any problems the morning of surgery: [phone]  Remember: Do not eat food after midnight: Tuesday Do not drink clear liquids after:  Tuesday Take these medicines the morning of surgery with a SIP OF WATER: All meds except Vitamin E  Do not wear jewelry, make-up, or FINGER nail polish Do not wear lotions, powders, perfumes or deodorant. Do not shave 48 hours prior to surgery. Do not bring valuables to the hospital.  Patients discharged on the day of surgery will not be allowed to drive home.   Home with Boyfriend Dion Body    Remember to use your hibiclens as instructed.Please shower with 1/2 bottle the evening before your surgery and the other 1/2 bottle the morning of surgery.

## 2011-05-16 ENCOUNTER — Encounter (HOSPITAL_COMMUNITY)
Admission: RE | Disposition: A | Discharge status: Home / Self Care | Payer: Self-pay | Source: Ambulatory Visit | Attending: Obstetrics & Gynecology

## 2011-05-16 ENCOUNTER — Encounter (HOSPITAL_COMMUNITY): Payer: Self-pay | Admitting: *Deleted

## 2011-05-16 ENCOUNTER — Encounter (HOSPITAL_COMMUNITY): Payer: Self-pay | Admitting: Anesthesiology

## 2011-05-16 ENCOUNTER — Ambulatory Visit (HOSPITAL_COMMUNITY)
Admission: RE | Admit: 2011-05-16 | Discharge: 2011-05-16 | Disposition: A | Discharge status: Home / Self Care | Payer: Medicaid Other | Source: Ambulatory Visit | Attending: Obstetrics & Gynecology | Admitting: Obstetrics & Gynecology

## 2011-05-16 ENCOUNTER — Ambulatory Visit (HOSPITAL_COMMUNITY): Payer: Medicaid Other | Admitting: Anesthesiology

## 2011-05-16 DIAGNOSIS — Z01812 Encounter for preprocedural laboratory examination: Secondary | ICD-10-CM | POA: Insufficient documentation

## 2011-05-16 DIAGNOSIS — N83209 Unspecified ovarian cyst, unspecified side: Secondary | ICD-10-CM | POA: Insufficient documentation

## 2011-05-16 DIAGNOSIS — Z5309 Procedure and treatment not carried out because of other contraindication: Secondary | ICD-10-CM | POA: Insufficient documentation

## 2011-05-16 DIAGNOSIS — Z01818 Encounter for other preprocedural examination: Secondary | ICD-10-CM | POA: Insufficient documentation

## 2011-05-16 HISTORY — PX: LAPAROSCOPY: SHX197

## 2011-05-16 LAB — PREGNANCY, URINE: Preg Test, Ur: NEGATIVE

## 2011-05-16 SURGERY — Surgical Case
Anesthesia: *Unknown

## 2011-05-16 SURGERY — LAPAROSCOPY OPERATIVE
Anesthesia: General | Site: Abdomen | Laterality: Right

## 2011-05-16 MED ORDER — LIDOCAINE HCL (CARDIAC) 20 MG/ML IV SOLN
INTRAVENOUS | Status: AC
  Filled 2011-05-16: qty 5

## 2011-05-16 MED ORDER — ONDANSETRON HCL 4 MG/2ML IJ SOLN
INTRAMUSCULAR | Status: AC
  Filled 2011-05-16: qty 2

## 2011-05-16 MED ORDER — MIDAZOLAM HCL 2 MG/2ML IJ SOLN
INTRAMUSCULAR | Status: AC
  Filled 2011-05-16: qty 2

## 2011-05-16 MED ORDER — FENTANYL CITRATE 0.05 MG/ML IJ SOLN
INTRAMUSCULAR | Status: AC
  Filled 2011-05-16: qty 5

## 2011-05-16 MED ORDER — LACTATED RINGERS IV SOLN: INTRAVENOUS | Status: DC

## 2011-05-16 MED ORDER — BUPIVACAINE HCL (PF) 0.25 % IJ SOLN
INTRAMUSCULAR | Status: AC
  Filled 2011-05-16: qty 30

## 2011-05-16 MED ORDER — DEXAMETHASONE SODIUM PHOSPHATE 10 MG/ML IJ SOLN
INTRAMUSCULAR | Status: AC
  Filled 2011-05-16: qty 1

## 2011-05-16 MED ORDER — ROCURONIUM BROMIDE 50 MG/5ML IV SOLN
INTRAVENOUS | Status: AC
  Filled 2011-05-16: qty 1

## 2011-05-16 MED ORDER — PROPOFOL 10 MG/ML IV EMUL
INTRAVENOUS | Status: AC
  Filled 2011-05-16: qty 20

## 2011-05-16 MED ORDER — LACTATED RINGERS IV SOLN
INTRAVENOUS | Status: DC
  Administered 2011-05-16: 09:00:00 via INTRAVENOUS

## 2011-05-16 SURGICAL SUPPLY — 26 items
CABLE HIGH FREQUENCY MONO STRZ (ELECTRODE) IMPLANT
CATH ROBINSON RED A/P 16FR (CATHETERS) IMPLANT
CHLORAPREP W/TINT 26ML (MISCELLANEOUS) IMPLANT
CLOTH BEACON ORANGE TIMEOUT ST (SAFETY) IMPLANT
DERMABOND ADVANCED (GAUZE/BANDAGES/DRESSINGS)
DERMABOND ADVANCED .7 DNX12 (GAUZE/BANDAGES/DRESSINGS) IMPLANT
GLOVE BIO SURGEON STRL SZ 6.5 (GLOVE) IMPLANT
GLOVE BIO SURGEON STRL SZ7 (GLOVE) IMPLANT
GLOVE BIOGEL PI IND STRL 7.0 (GLOVE) IMPLANT
GLOVE BIOGEL PI INDICATOR 7.0 (GLOVE)
GOWN PREVENTION PLUS LG XLONG (DISPOSABLE) IMPLANT
NEEDLE INSUFFLATION 14GA 120MM (NEEDLE) IMPLANT
NS IRRIG 1000ML POUR BTL (IV SOLUTION) IMPLANT
PACK LAPAROSCOPY BASIN (CUSTOM PROCEDURE TRAY) IMPLANT
SCALPEL HARMONIC ACE (MISCELLANEOUS) IMPLANT
SET IRRIG TUBING LAPAROSCOPIC (IRRIGATION / IRRIGATOR) IMPLANT
STRIP CLOSURE SKIN 1/2X4 (GAUZE/BANDAGES/DRESSINGS) IMPLANT
SUT VICRYL 0 UR6 27IN ABS (SUTURE) IMPLANT
SUT VICRYL 4-0 PS2 18IN ABS (SUTURE) IMPLANT
TOWEL OR 17X24 6PK STRL BLUE (TOWEL DISPOSABLE) IMPLANT
TRAY FOLEY CATH 14FR (SET/KITS/TRAYS/PACK) IMPLANT
TROCAR BALLN 12MMX100 BLUNT (TROCAR) IMPLANT
TROCAR XCEL NON-BLD 5MMX100MML (ENDOMECHANICALS) IMPLANT
TROCAR Z-THREAD BLADED 11X100M (TROCAR) IMPLANT
WARMER LAPAROSCOPE (MISCELLANEOUS) IMPLANT
WATER STERILE IRR 1000ML POUR (IV SOLUTION) IMPLANT

## 2011-05-16 NOTE — Anesthesia Preprocedure Evaluation (Addendum)
Anesthesia Evaluation  Patient identified by MRN, date of birth, ID band Patient awake    Reviewed: Allergy & Precautions, H&P , Patient's Chart, lab work & pertinent test results, reviewed documented beta blocker date and time   History of Anesthesia Complications Negative for: history of anesthetic complications  Airway Mallampati: III TM Distance: >3 FB Neck ROM: full    Dental No notable dental hx.    Pulmonary neg pulmonary ROS,  clear to auscultation  Pulmonary exam normal       Cardiovascular Exercise Tolerance: Good neg cardio ROS + Valvular Problems/Murmurs regular Normal    Neuro/Psych  Headaches, Seizures -,  PSYCHIATRIC DISORDERS  Neuromuscular disease Negative Neurological ROS  Negative Psych ROS   GI/Hepatic negative GI ROS, Neg liver ROS,   Endo/Other  Negative Endocrine ROS  Renal/GU negative Renal ROS     Musculoskeletal   Abdominal   Peds  Hematology negative hematology ROS (+)   Anesthesia Other Findings Stress test potentially on Feb 8th Exercise intolerance... Potentially neuromuscular but could be Cardiovascular  Reproductive/Obstetrics negative OB ROS                         Anesthesia Physical Anesthesia Plan  ASA: III  Anesthesia Plan: General ETT   Post-op Pain Management:    Induction:   Airway Management Planned:   Additional Equipment:   Intra-op Plan:   Post-operative Plan:   Informed Consent: I have reviewed the patients History and Physical, chart, labs and discussed the procedure including the risks, benefits and alternatives for the proposed anesthesia with the patient or authorized representative who has indicated his/her understanding and acceptance.   Dental Advisory Given  Plan Discussed with: CRNA and Surgeon  Anesthesia Plan Comments:         Anesthesia Quick Evaluation

## 2011-05-16 NOTE — H&P (Addendum)
Patient ID: Christine Vega, female DOB: [birth year], 41 y.o. MRN: [id number]  HPI Patient's last menstrual period was 04/20/2011.  [id number]  The patient comes today in followup after an ultrasound was repeated in August. She had an ultrasound in March that showed a right ovarian cystic lesion about 5 cm. Ultrasound in August 30 confirmed a complex right ovarian cyst 6.1 x 5.2 cm which is suspicious for a dermoid. Patient has occasional pain but her menstrual cycles are regular and not heavy or bothersome. Ultrasound showed a normal trilaminar endometrium. Offered laparoscopy and removal of the right ovarian cyst. She understands this might require removal of the ovary. The risk of a laparotomy was also discussed. Anesthesia complications and the risks of bleeding infection and abdominal organ damage was also discussed her questions were answered. She would like to have this procedure scheduled.  Past Medical History   Diagnosis  Date   .  Migraines    .  Anemia    .  Depression    .  Endometriosis    .  Hyperlipidemia     Past Surgical History   Procedure  Date   .  Cholecystectomy    .  Hernia repair    .  Tubal ligation    laparoscopy performed in [place], [place] in 2010 with a diagnosis of endometriosis. She states that she received no additional therapy after surgery. Do not have records of that procedure.  Current Outpatient Prescriptions on File Prior to Visit   Medication  Sig  Dispense  Refill   .  divalproex (DEPAKOTE) 500 MG 24 hr tablet  Take 500 mg by mouth 2 (two) times daily.      No current facility-administered medications on file prior to visit.    Allergies   Allergen  Reactions   .  Zithromax (Azithromycin)  Itching    History    Social History   .  Marital Status:  Legally Separated     Spouse Name:  N/A     Number of Children:  N/A   .  Years of Education:  N/A    Occupational History   .  Not on file.    Social History Main Topics   .   Smoking status:  Never Smoker   .  Smokeless tobacco:  Never Used   .  Alcohol Use:  No   .  Drug Use:  No   .  Sexually Active:  Yes     Birth Control/ Protection:  Surgical    Other Topics  Concern   .  Not on file    Social History Narrative   .  No narrative on file    No family history on file.  Review of Systems  No vaginal bleeding vaginal discharge or pain reported today.  Filed Vitals:   05/16/11 0845  BP: 117/76  Pulse: 95  Temp: 98.2 F (36.8 C)  TempSrc: Oral  Resp: 16  Height: 5' 11.5" (1.816 m)  Weight: 94.348 kg (208 lb)  SpO2: 99%    No acute distress pleasant affect  Chest clear Heart RRR Abdomen not tender, non distended, no mass, transverse keloid scar at umbilicus from hernia repair Pap smear in April 2011 was normal  Extremity no swelling, tenderness  *RADIOLOGY REPORT*  Clinical Data: Dysfunctional uterine bleeding and pelvic pain. LMP  11/29/2010  TRANSVAGINAL ULTRASOUND OF PELVIS  Technique: Transvaginal ultrasound examination of the pelvis was  performed including evaluation of the uterus, ovaries,  adnexal  regions, and pelvic cul-de-sac.  Comparison: 06/26/2010  Findings:  Uterus: The uterus demonstrates a sagittal length of 10.9 cm, an  AP depth of 5.5 cm and a transverse width of 6.1 cm. A homogeneous  myometrium is noted.  Endometrium: Measures 1.1 cm in diameter and has a tri- layered  appearance compatible with a periovulatory endometrial stripe and  corresponding with the patient's given LMP of 11/29/2010  Right ovary: The right ovary measures 3.1 x 2.4 by 3.5 cm and  contains a dominant follicle. Directly adjacent to and contiguous  with the right ovary is a complex cystic lesion measuring 6.1 x 5.2  x 6.0 cm. This contains echogenic soft tissue and linear internal  echoes. No evidence for associated intralesional flow is seen.  This is similar in appearance to the prior exam but the overall  size is slightly larger. The  appearance is suspicious for but not  confirmatory for an ovarian dermoid as it is not clearly ovarian  and may be extraovarian.  Left ovary: Has a normal appearance measuring 3.5 x 2.1 x 2.5 cm.  Other Findings: A small amount of simple free fluid is identified  adjacent to the right ovary  IMPRESSION:  Persistent complex right adnexal mass adjacentto the right ovary.  This may be separate from the right ovary or exophytic from the  right ovary. Imaging characteristics are suspicious for a dermoid  but there has been some interval increase in size and as this  appearance is not confirmatory for that diagnosis, further  evaluation with pelvic MRI with contrast is recommended.  Original Report Authenticated By: Bertha Stakes, M.D.       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 CA 125 performed in August was normal. She'll be scheduled for diagnostic laparoscopy and removal of right ovarian cyst possible right oophorectomy.  Scheryl Darter MD  1/302013  The patient relates that she is being evaluated for exercise intolerance by a cardiologist, and she is to have a nuclear medicine stress test on 05/25/11. We cannot find any information on her current state of evaluation. We will cancel today awaiting the completion of her cardiac testing. The patient understands and agrees. Dr. Sheral Apley agrees. Angeleena Dueitt 05/16/11

## 2011-05-17 ENCOUNTER — Encounter (HOSPITAL_COMMUNITY): Payer: Self-pay | Admitting: Obstetrics & Gynecology

## 2011-05-17 NOTE — Anesthesia Postprocedure Evaluation (Signed)
Case was cancelled to allow for further cardiac testing.

## 2011-05-18 ENCOUNTER — Emergency Department (HOSPITAL_COMMUNITY)
Admission: EM | Admit: 2011-05-18 | Discharge: 2011-05-18 | Disposition: A | Discharge status: Home / Self Care | Payer: Medicaid Other | Attending: Emergency Medicine | Admitting: Emergency Medicine

## 2011-05-18 ENCOUNTER — Emergency Department (HOSPITAL_COMMUNITY): Payer: Medicaid Other

## 2011-05-18 DIAGNOSIS — N83209 Unspecified ovarian cyst, unspecified side: Secondary | ICD-10-CM | POA: Insufficient documentation

## 2011-05-18 DIAGNOSIS — R102 Pelvic and perineal pain: Secondary | ICD-10-CM

## 2011-05-18 DIAGNOSIS — R011 Cardiac murmur, unspecified: Secondary | ICD-10-CM | POA: Insufficient documentation

## 2011-05-18 DIAGNOSIS — R10814 Left lower quadrant abdominal tenderness: Secondary | ICD-10-CM | POA: Insufficient documentation

## 2011-05-18 DIAGNOSIS — N949 Unspecified condition associated with female genital organs and menstrual cycle: Secondary | ICD-10-CM | POA: Insufficient documentation

## 2011-05-18 DIAGNOSIS — N83299 Other ovarian cyst, unspecified side: Secondary | ICD-10-CM

## 2011-05-18 DIAGNOSIS — N938 Other specified abnormal uterine and vaginal bleeding: Secondary | ICD-10-CM | POA: Insufficient documentation

## 2011-05-18 LAB — CBC
HCT: 36.4 % (ref 36.0–46.0)
Hemoglobin: 12.5 g/dL (ref 12.0–15.0)
MCH: 28.9 pg (ref 26.0–34.0)
MCHC: 34.3 g/dL (ref 30.0–36.0)
MCV: 84.3 fL (ref 78.0–100.0)
Platelets: 278 10*3/uL (ref 150–400)
RBC: 4.32 MIL/uL (ref 3.87–5.11)
RDW: 13 % (ref 11.5–15.5)
WBC: 6.9 10*3/uL (ref 4.0–10.5)

## 2011-05-18 LAB — COMPREHENSIVE METABOLIC PANEL
ALT: 11 U/L (ref 0–35)
AST: 13 U/L (ref 0–37)
Albumin: 3.9 g/dL (ref 3.5–5.2)
Alkaline Phosphatase: 62 U/L (ref 39–117)
BUN: 7 mg/dL (ref 6–23)
CO2: 25 mEq/L (ref 19–32)
Calcium: 9.4 mg/dL (ref 8.4–10.5)
Chloride: 101 mEq/L (ref 96–112)
Creatinine, Ser: 0.67 mg/dL (ref 0.50–1.10)
GFR calc Af Amer: >90 mL/min (ref >90)
GFR calc non Af Amer: >90 mL/min (ref >90)
Glucose, Bld: 101 mg/dL — ABNORMAL HIGH (ref 70–99)
Potassium: 4.1 mEq/L (ref 3.5–5.1)
Sodium: 134 mEq/L — ABNORMAL LOW (ref 135–145)
Total Bilirubin: 0.2 mg/dL — ABNORMAL LOW (ref 0.3–1.2)
Total Protein: 7.7 g/dL (ref 6.0–8.3)

## 2011-05-18 LAB — URINALYSIS, ROUTINE W REFLEX MICROSCOPIC
Bilirubin Urine: NEGATIVE
Glucose, UA: NEGATIVE mg/dL
Ketones, ur: NEGATIVE mg/dL
Leukocytes, UA: NEGATIVE
Nitrite: NEGATIVE
Protein, ur: NEGATIVE mg/dL
Specific Gravity, Urine: 1.017 (ref 1.005–1.030)
Urobilinogen, UA: 0.2 mg/dL (ref 0.0–1.0)
pH: 7.5 (ref 5.0–8.0)

## 2011-05-18 LAB — DIFFERENTIAL
Basophils Absolute: 0 10*3/uL (ref 0.0–0.1)
Basophils Relative: 0 % (ref 0–1)
Eosinophils Absolute: 0.1 10*3/uL (ref 0.0–0.7)
Eosinophils Relative: 1 % (ref 0–5)
Lymphocytes Relative: 14 % (ref 12–46)
Lymphs Abs: 1 10*3/uL (ref 0.7–4.0)
Monocytes Absolute: 0.3 10*3/uL (ref 0.1–1.0)
Monocytes Relative: 5 % (ref 3–12)
Neutro Abs: 5.5 10*3/uL (ref 1.7–7.7)
Neutrophils Relative %: 80 % — ABNORMAL HIGH (ref 43–77)

## 2011-05-18 LAB — WET PREP, GENITAL
Clue Cells Wet Prep HPF POC: NONE SEEN
Trich, Wet Prep: NONE SEEN
WBC, Wet Prep HPF POC: NONE SEEN
Yeast Wet Prep HPF POC: NONE SEEN

## 2011-05-18 LAB — LIPASE, BLOOD: Lipase: 17 U/L (ref 11–59)

## 2011-05-18 LAB — PREGNANCY, URINE: Preg Test, Ur: NEGATIVE

## 2011-05-18 LAB — URINE MICROSCOPIC-ADD ON

## 2011-05-18 MED ORDER — KETOROLAC TROMETHAMINE 30 MG/ML IJ SOLN
30.0000 mg | Freq: Once | INTRAMUSCULAR | Status: AC
  Administered 2011-05-18: 30 mg via INTRAVENOUS
  Filled 2011-05-18: qty 1

## 2011-05-18 MED ORDER — ONDANSETRON HCL 4 MG/2ML IJ SOLN
4.0000 mg | Freq: Once | INTRAMUSCULAR | Status: AC
  Administered 2011-05-18: 4 mg via INTRAVENOUS
  Filled 2011-05-18: qty 2

## 2011-05-18 MED ORDER — ONDANSETRON HCL 4 MG PO TABS: 4.0000 mg | ORAL_TABLET | Freq: Four times a day (QID) | ORAL | Status: AC

## 2011-05-18 MED ORDER — SODIUM CHLORIDE 0.9 % IV BOLUS (SEPSIS)
1000.0000 mL | Freq: Once | INTRAVENOUS | Status: AC
  Administered 2011-05-18: 1000 mL via INTRAVENOUS

## 2011-05-18 MED ORDER — OXYCODONE-ACETAMINOPHEN 5-325 MG PO TABS: 2.0000 | ORAL_TABLET | ORAL | Status: AC | PRN

## 2011-05-18 MED ORDER — HYDROMORPHONE HCL PF 1 MG/ML IJ SOLN
1.0000 mg | Freq: Once | INTRAMUSCULAR | Status: AC
  Administered 2011-05-18: 1 mg via INTRAVENOUS
  Filled 2011-05-18: qty 1

## 2011-05-18 NOTE — ED Notes (Signed)
Pelvic cart set up 

## 2011-05-18 NOTE — ED Notes (Signed)
Abd pain w/ n/v x 24 hrs.  Pain constant to LLQ, worse w/movement and palaption w/o radiation.  Abd soft, tender. EMS VS 142/82, 96.  Family w/her at b/s.

## 2011-05-18 NOTE — ED Notes (Signed)
[id number]<AV> Expected date:<BR> Expected time:12:19 PM<BR> Means of arrival:<BR> Comments:<BR> M41 - 40yoF NV abd pain 24 hrs

## 2011-05-18 NOTE — ED Notes (Signed)
Patient transported to Ultrasound via stretcher 

## 2011-05-18 NOTE — ED Provider Notes (Signed)
History     CSN: [id number]  Arrival date & time 05/18/11  1224   First MD Initiated Contact with Patient 05/18/11 1232      Chief Complaint  Patient presents with  . Abdominal Pain  . Emesis    "yellow stuff x 24 hrs"    (Consider location/radiation/quality/duration/timing/severity/associated sxs/prior treatment) HPI  41 year old female with history of endometriosis and history of anxiety is presenting to the ED with chief complaints of abdominal pain. Patient states yesterday she experiencing pain to the left lower quadrant that lasted for a few hours. The pain is described as a stabbing sensation, nonradiating with no other associated symptoms. She woke up this morning around 5 AM with the same pain and now has been constant. Patient state pain felt different from anything she felt in the past. She also complained of bouts of yellow emesis. She denies fever, chest pain, shortness of breath, back pain, dysuria, vaginal discharge. She denies loss of appetite. She denies blood per rectum. Her last bowel movement was this morning and it was normal. Patient mentioned that she just started her menstruation this morning as well, however this pain is different from her normal menstrual cramping. She describes over-the-counter ibuprofen without relief. She denies any recent trauma. Patient has history of cholecystectomy.   Past Medical History  Diagnosis Date  . Depression   . Endometriosis   . Hyperlipidemia   . Heart murmur     no problems  . Anxiety   . History of seasonal allergies   . Anemia     history   . Seizures 2004    x 1; unknown source  . Back pain     rotates to left hip and tailbone  . 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 Migraines     tx w/depakote/verapamil per pt    Past Surgical History  Procedure Date  . Cholecystectomy   . Hernia repair   . Tubal ligation   . Svd     x 2  . Wisdom tooth extraction   . Laparotomy    fro endometriosis/adhesions  . Laparoscopy 05/16/2011    Procedure: LAPAROSCOPY OPERATIVE;  Surgeon: Scheryl Darter, MD;  Location: [facility];  Service: Gynecology;  Laterality: N/A;  poss oophorectomy  . Ovarian cyst removal 05/16/2011    Procedure: OVARIAN CYSTECTOMY;  Surgeon: Scheryl Darter, MD;  Location: [facility];  Service: Gynecology;  Laterality: Right;    No family history on file.  History  Substance Use Topics  . Smoking status: Never Smoker   . Smokeless tobacco: Never Used  . Alcohol Use: No    OB History    Grav Para Term Preterm Abortions TAB SAB Ect Mult Living   2 2 2  0 0 0 0 0 0 2      Review of Systems  All other systems reviewed and are negative.    Allergies  Zithromax  Home Medications   Current Outpatient Rx  Name Route Sig Dispense Refill  . BUPROPION HCL ER (XL) 300 MG PO TB24 Oral Take 300 mg by mouth daily.    Marland Kitchen DIVALPROEX SODIUM ER 500 MG PO TB24 Oral Take 500 mg by mouth 2 (two) times daily.      Marland Kitchen LORATADINE 10 MG PO TABS Oral Take 10 mg by mouth daily.    Marland Kitchen LORAZEPAM 1 MG PO TABS Oral Take 0.5 mg by mouth 4 (four) times daily. For anxiety.    Marland Kitchen  OXYCODONE HCL 5 MG PO TABS Oral Take 10 mg by mouth 4 (four) times daily.    . SERTRALINE HCL 100 MG PO TABS Oral Take 150 mg by mouth daily.    Marland Kitchen TIZANIDINE HCL 4 MG PO TABS Oral Take 4 mg by mouth 2 (two) times daily.    Marland Kitchen VERAPAMIL HCL ER 120 MG PO TBCR Oral Take 120 mg by mouth at bedtime.    Marland Kitchen VITAMIN E 100 UNITS PO CAPS Oral Take 100 Units by mouth daily.      BP 132/77  Pulse 86  Temp(Src) 98.5 F (36.9 C) (Oral)  Resp 20  SpO2 100%  LMP 05/18/2011  Physical Exam  Nursing note and vitals reviewed. Constitutional: She appears well-developed and well-nourished. No distress.  HENT:  Head: Normocephalic and atraumatic.  Eyes: Conjunctivae are normal.  Neck: Normal range of motion. Neck supple.  Cardiovascular: Normal rate and regular rhythm.   Murmur heard. Pulmonary/Chest: Effort normal and  breath sounds normal. No respiratory distress. She has no wheezes. She has no rales. She exhibits no tenderness.  Abdominal: Soft. She exhibits no mass. There is tenderness in the left lower quadrant. There is no rebound and no guarding.    Genitourinary: Uterus normal. There is no rash, tenderness or lesion on the right labia. There is no rash, tenderness or lesion on the left labia. Cervix exhibits no motion tenderness and no discharge. Right adnexum displays no mass and no tenderness. Left adnexum displays no mass and no tenderness. There is bleeding around the vagina. No erythema or tenderness around the vagina. No foreign body around the vagina. No vaginal discharge found.  Lymphadenopathy:       Right: No inguinal adenopathy present.       Left: No inguinal adenopathy present.    ED Course  Procedures (including critical care time)  Labs Reviewed - No data to display No results found.   No diagnosis found.  Results for orders placed during the hospital encounter of 05/18/11  CBC      Component Value Range   WBC 6.9  4.0 - 10.5 (K/uL)   RBC 4.32  3.87 - 5.11 (MIL/uL)   Hemoglobin 12.5  12.0 - 15.0 (g/dL)   HCT 16.1  09.6 - 04.5 (%)   MCV 84.3  78.0 - 100.0 (fL)   MCH 28.9  26.0 - 34.0 (pg)   MCHC 34.3  30.0 - 36.0 (g/dL)   RDW 40.9  81.1 - 91.4 (%)   Platelets 278  150 - 400 (K/uL)  DIFFERENTIAL      Component Value Range   Neutrophils Relative 80 (*) 43 - 77 (%)   Lymphocytes Relative 14  12 - 46 (%)   Monocytes Relative 5  3 - 12 (%)   Eosinophils Relative 1  0 - 5 (%)   Basophils Relative 0  0 - 1 (%)   Neutro Abs 5.5  1.7 - 7.7 (K/uL)   Lymphs Abs 1.0  0.7 - 4.0 (K/uL)   Monocytes Absolute 0.3  0.1 - 1.0 (K/uL)   Eosinophils Absolute 0.1  0.0 - 0.7 (K/uL)   Basophils Absolute 0.0  0.0 - 0.1 (K/uL)   WBC Morphology ATYPICAL LYMPHOCYTES     Smear Review PLATELET COUNT CONFIRMED BY SMEAR    COMPREHENSIVE METABOLIC PANEL      Component Value Range   Sodium 134 (*)  135 - 145 (mEq/L)   Potassium 4.1  3.5 - 5.1 (mEq/L)   Chloride 101  96 - 112 (mEq/L)   CO2 25  19 - 32 (mEq/L)   Glucose, Bld 101 (*) 70 - 99 (mg/dL)   BUN 7  6 - 23 (mg/dL)   Creatinine, Ser 5.28  0.50 - 1.10 (mg/dL)   Calcium 9.4  8.4 - 41.3 (mg/dL)   Total Protein 7.7  6.0 - 8.3 (g/dL)   Albumin 3.9  3.5 - 5.2 (g/dL)   AST 13  0 - 37 (U/L)   ALT 11  0 - 35 (U/L)   Alkaline Phosphatase 62  39 - 117 (U/L)   Total Bilirubin 0.2 (*) 0.3 - 1.2 (mg/dL)   GFR calc non Af Amer >90  >90 (mL/min)   GFR calc Af Amer >90  >90 (mL/min)  LIPASE, BLOOD      Component Value Range   Lipase 17  11 - 59 (U/L)  URINALYSIS, ROUTINE W REFLEX MICROSCOPIC      Component Value Range   Color, Urine YELLOW  YELLOW    APPearance CLEAR  CLEAR    Specific Gravity, Urine 1.017  1.005 - 1.030    pH 7.5  5.0 - 8.0    Glucose, UA NEGATIVE  NEGATIVE (mg/dL)   Hgb urine dipstick SMALL (*) NEGATIVE    Bilirubin Urine NEGATIVE  NEGATIVE    Ketones, ur NEGATIVE  NEGATIVE (mg/dL)   Protein, ur NEGATIVE  NEGATIVE (mg/dL)   Urobilinogen, UA 0.2  0.0 - 1.0 (mg/dL)   Nitrite NEGATIVE  NEGATIVE    Leukocytes, UA NEGATIVE  NEGATIVE   PREGNANCY, URINE      Component Value Range   Preg Test, Ur NEGATIVE  NEGATIVE   WET PREP, GENITAL      Component Value Range   Yeast Wet Prep HPF POC NONE SEEN  NONE SEEN    Trich, Wet Prep NONE SEEN  NONE SEEN    Clue Cells Wet Prep HPF POC NONE SEEN  NONE SEEN    WBC, Wet Prep HPF POC NONE SEEN  NONE SEEN   URINE MICROSCOPIC-ADD ON      Component Value Range   Squamous Epithelial / LPF FEW (*) RARE    RBC / HPF 0-2  <3 (RBC/hpf)   US Transvaginal Non-ob  05/18/2011  *RADIOLOGY REPORT*  Clinical Data:  Left pelvic pain.  Clinical suspicion for ovarian torsion. Known right ovarian mass.  TRANSABDOMINAL AND TRANSVAGINAL ULTRASOUND OF PELVIS DOPPLER ULTRASOUND OF OVARIES  Technique:  Both transabdominal and transvaginal ultrasound examinations of the pelvis were performed.  Transabdominal technique was performed for global imaging of the pelvis including uterus, ovaries, adnexal regions, and pelvic cul-de-sac.  It was necessary to proceed with endovaginal exam following the transabdominal exam to visualize the endometrium, ovaries, and right adnexal mass.  Color and duplex Doppler ultrasound was utilized to evaluate blood flow to the ovaries.  Comparison:  12/14/2010  Findings:  Uterus:  9.8 x 6.0 x 5.6 cm.  No fibroids or other uterine masses are identified.  Endometrium:  8 mm double layer thickness measured transvaginally. No focal lesion visualized.  Right ovary: A complex cystic mass is again seen in the right adnexa.  This contains numerous internal fine septations and hyperechoic areas, although no internal blood flow is seen on color Doppler ultrasound.  This measures 7.2 x 6.1 by 7.5 cm, and is mildly increased in size compared to prior exam.  Color Doppler ultrasound does show blood flow in the peripheral wall of this effusion, which may represent ovarian tissue.  Left  ovary:   Normal appearance/no adnexal mass  Pulsed Doppler evaluation demonstrates normal low-resistance arterial and venous waveforms in the left ovary and the peripheral wall of the right adnexal cystic mass.  Other:  No free fluid.  IMPRESSION:  1.  Mild increase in size of 7 cm complex cystic right adnexal mass, suspicious for cystic ovarian neoplasm.  Recommend surgical evaluation, or further characterization by pelvic MRI without and with contrast. 2.  No sonographic evidence for ovarian torsion. 3.  Normal appearance of uterus and left ovary.  No free fluid.  Original Report Authenticated By: Danae Orleans, M.D.   US Pelvis Complete  05/18/2011  *RADIOLOGY REPORT*  Clinical Data:  Left pelvic pain.  Clinical suspicion for ovarian torsion. Known right ovarian mass.  TRANSABDOMINAL AND TRANSVAGINAL ULTRASOUND OF PELVIS DOPPLER ULTRASOUND OF OVARIES  Technique:  Both transabdominal and transvaginal  ultrasound examinations of the pelvis were performed. Transabdominal technique was performed for global imaging of the pelvis including uterus, ovaries, adnexal regions, and pelvic cul-de-sac.  It was necessary to proceed with endovaginal exam following the transabdominal exam to visualize the endometrium, ovaries, and right adnexal mass.  Color and duplex Doppler ultrasound was utilized to evaluate blood flow to the ovaries.  Comparison:  12/14/2010  Findings:  Uterus:  9.8 x 6.0 x 5.6 cm.  No fibroids or other uterine masses are identified.  Endometrium:  8 mm double layer thickness measured transvaginally. No focal lesion visualized.  Right ovary: A complex cystic mass is again seen in the right adnexa.  This contains numerous internal fine septations and hyperechoic areas, although no internal blood flow is seen on color Doppler ultrasound.  This measures 7.2 x 6.1 by 7.5 cm, and is mildly increased in size compared to prior exam.  Color Doppler ultrasound does show blood flow in the peripheral wall of this effusion, which may represent ovarian tissue.  Left ovary:   Normal appearance/no adnexal mass  Pulsed Doppler evaluation demonstrates normal low-resistance arterial and venous waveforms in the left ovary and the peripheral wall of the right adnexal cystic mass.  Other:  No free fluid.  IMPRESSION:  1.  Mild increase in size of 7 cm complex cystic right adnexal mass, suspicious for cystic ovarian neoplasm.  Recommend surgical evaluation, or further characterization by pelvic MRI without and with contrast. 2.  No sonographic evidence for ovarian torsion. 3.  Normal appearance of uterus and left ovary.  No free fluid.  Original Report Authenticated By: Danae Orleans, M.D.   [place] Art/ven Flow Abd Pelv Doppler  05/18/2011  *RADIOLOGY REPORT*  Clinical Data:  Left pelvic pain.  Clinical suspicion for ovarian torsion. Known right ovarian mass.  TRANSABDOMINAL AND TRANSVAGINAL ULTRASOUND OF PELVIS DOPPLER ULTRASOUND  OF OVARIES  Technique:  Both transabdominal and transvaginal ultrasound examinations of the pelvis were performed. Transabdominal technique was performed for global imaging of the pelvis including uterus, ovaries, adnexal regions, and pelvic cul-de-sac.  It was necessary to proceed with endovaginal exam following the transabdominal exam to visualize the endometrium, ovaries, and right adnexal mass.  Color and duplex Doppler ultrasound was utilized to evaluate blood flow to the ovaries.  Comparison:  12/14/2010  Findings:  Uterus:  9.8 x 6.0 x 5.6 cm.  No fibroids or other uterine masses are identified.  Endometrium:  8 mm double layer thickness measured transvaginally. No focal lesion visualized.  Right ovary: A complex cystic mass is again seen in the right adnexa.  This contains numerous internal fine septations  and hyperechoic areas, although no internal blood flow is seen on color Doppler ultrasound.  This measures 7.2 x 6.1 by 7.5 cm, and is mildly increased in size compared to prior exam.  Color Doppler ultrasound does show blood flow in the peripheral wall of this effusion, which may represent ovarian tissue.  Left ovary:   Normal appearance/no adnexal mass  Pulsed Doppler evaluation demonstrates normal low-resistance arterial and venous waveforms in the left ovary and the peripheral wall of the right adnexal cystic mass.  Other:  No free fluid.  IMPRESSION:  1.  Mild increase in size of 7 cm complex cystic right adnexal mass, suspicious for cystic ovarian neoplasm.  Recommend surgical evaluation, or further characterization by pelvic MRI without and with contrast. 2.  No sonographic evidence for ovarian torsion. 3.  Normal appearance of uterus and left ovary.  No free fluid.  Original Report Authenticated By: Danae Orleans, M.D.      MDM  Patient states the pain is improved significantly after pain medication. She only has mild nausea without vomiting. CBC, CMP, lipase, urine pregnancy, urinalysis all  within normal limits. Ultrasounds of the pelvis reveal a mild increase in size of 7 cm complex cystic right adnexal mass which is suspicious for cystic ovarian neoplasm. I explained the results of the patient. Patient is aware, and states she is scheduled to have surgery with Dr. Brayton Layman. She is currently undergoing preoperative preparation and anticipate to have the procedure done soon. I will discharge patient with pain medication and antinausea medication, along with strict followup instructions if the symptoms worsen. I do not believe a CT scan is warranted at this time, as patient has no blood per rectum and no evidence of infection.  Patient agrees with plan.        Fayrene Helper, PA-C 05/18/11 1700

## 2011-05-18 NOTE — ED Notes (Signed)
Pt refused in/out cath due to pain.  Pt to obtain clean catch urine.  PA Laveda Norman notified of pt discomfort.

## 2011-05-19 LAB — GC/CHLAMYDIA PROBE AMP, GENITAL
Chlamydia, DNA Probe: NEGATIVE
GC Probe Amp, Genital: NEGATIVE

## 2011-05-24 ENCOUNTER — Encounter (HOSPITAL_COMMUNITY): Payer: Self-pay | Admitting: *Deleted

## 2011-05-25 NOTE — ED Provider Notes (Signed)
Medical screening examination/treatment/procedure(s) were performed by non-physician practitioner and as supervising physician I was immediately available for consultation/collaboration.  Aldrich Lloyd, MD 05/25/11 0106 

## 2011-06-13 ENCOUNTER — Telehealth: Payer: Self-pay | Admitting: *Deleted

## 2011-06-13 NOTE — Telephone Encounter (Signed)
Spoke w/pt and informed her that I have received her message. I will speak to Dr. Debroah Loop later today and inform him that she is ready for surgery as soon as it can be scheduled.  Pt voiced understanding.

## 2011-06-13 NOTE — Telephone Encounter (Signed)
Pt left message stating that she has been cleared for surgery by her cardiologist. She would like the surgery re-scheduled.

## 2011-06-24 ENCOUNTER — Emergency Department (INDEPENDENT_AMBULATORY_CARE_PROVIDER_SITE_OTHER)
Admission: EM | Admit: 2011-06-24 | Discharge: 2011-06-24 | Disposition: A | Discharge status: Home / Self Care | Payer: Medicaid Other | Source: Home / Self Care | Attending: Family Medicine | Admitting: Family Medicine

## 2011-06-24 ENCOUNTER — Encounter (HOSPITAL_COMMUNITY): Payer: Self-pay | Admitting: *Deleted

## 2011-06-24 DIAGNOSIS — S161XXA Strain of muscle, fascia and tendon at neck level, initial encounter: Secondary | ICD-10-CM

## 2011-06-24 DIAGNOSIS — S139XXA Sprain of joints and ligaments of unspecified parts of neck, initial encounter: Secondary | ICD-10-CM

## 2011-06-24 NOTE — ED Notes (Signed)
CO BEING KNOCKED ONTO BACK YESTERDAY EVENING, HAS STARTED HAVING NECK PAIN STARTING THIS AM, HX OF 3RD VERTEBRAE BREAK, CAN TURN HEAD BUT STATES BACK OF NECK IS PAINFUL

## 2011-06-24 NOTE — Discharge Instructions (Signed)
Heat to neck for soreness , use your medicine as needed, see your doctor if further problems

## 2011-06-24 NOTE — ED Provider Notes (Signed)
History     CSN: [id number]  Arrival date & time 06/24/11  1402   First MD Initiated Contact with Patient 06/24/11 1411      Chief Complaint  Patient presents with  . Neck Pain    (Consider location/radiation/quality/duration/timing/severity/associated sxs/prior treatment) Patient is a 41 y.o. female presenting with neck injury. The history is provided by the patient.  Neck Injury This is a new problem. The current episode started yesterday (knocked down yest by boys playing football onto back concerned about neck.). The problem has not changed since onset.Pertinent negatives include no chest pain and no abdominal pain.    Past Medical History  Diagnosis Date  . Depression   . Endometriosis   . Hyperlipidemia   . Heart murmur     no problems  . Anxiety   . History of seasonal allergies   . Anemia     history   . Seizures 2004    x 1; unknown source  . Back pain     rotates to left hip and tailbone  . 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 Migraines     tx w/depakote/verapamil per pt    Past Surgical History  Procedure Date  . Cholecystectomy   . Hernia repair   . Tubal ligation   . Svd     x 2  . Wisdom tooth extraction   . Laparotomy     fro endometriosis/adhesions  . Laparoscopy 05/16/2011    Procedure: LAPAROSCOPY OPERATIVE;  Surgeon: Scheryl Darter, MD;  Location: [facility];  Service: Gynecology;  Laterality: N/A;  poss oophorectomy  . Ovarian cyst removal 05/16/2011    Procedure: OVARIAN CYSTECTOMY;  Surgeon: Scheryl Darter, MD;  Location: [facility];  Service: Gynecology;  Laterality: Right;    History reviewed. No pertinent family history.  History  Substance Use Topics  . Smoking status: Never Smoker   . Smokeless tobacco: Never Used  . Alcohol Use: No    OB History    Grav Para Term Preterm Abortions TAB SAB Ect Mult Living   2 2 2  0 0 0 0 0 0 2      Review of Systems  Constitutional: Negative.   HENT: Positive  for neck pain.   Cardiovascular: Negative for chest pain.  Gastrointestinal: Negative for abdominal pain.  Musculoskeletal: Negative for back pain and gait problem.  Neurological: Negative.     Allergies  Zithromax  Home Medications   Current Outpatient Rx  Name Route Sig Dispense Refill  . BUPROPION HCL ER (XL) 300 MG PO TB24 Oral Take 300 mg by mouth daily.    Marland Kitchen DIVALPROEX SODIUM ER 500 MG PO TB24 Oral Take 500 mg by mouth 2 (two) times daily.      . IBUPROFEN 200 MG PO TABS Oral Take 400 mg by mouth every 8 (eight) hours as needed. For pain.    Marland Kitchen LORATADINE 10 MG PO TABS Oral Take 10 mg by mouth daily.    Marland Kitchen LORAZEPAM 1 MG PO TABS Oral Take 1.5 mg by mouth 4 (four) times daily. For anxiety.    . SERTRALINE HCL 100 MG PO TABS Oral Take 150 mg by mouth daily.    Marland Kitchen TIZANIDINE HCL 4 MG PO TABS Oral Take 4 mg by mouth 2 (two) times daily.    Marland Kitchen VERAPAMIL HCL ER 120 MG PO TBCR Oral Take 120 mg by mouth at bedtime.    Marland Kitchen VITAMIN E 100  UNITS PO CAPS Oral Take 100 Units by mouth daily.      BP 128/69  Pulse 88  Temp(Src) 98.6 F (37 C) (Oral)  Resp 16  SpO2 100%  LMP 06/19/2011  Physical Exam  Nursing note and vitals reviewed. Constitutional: She is oriented to person, place, and time. She appears well-developed and well-nourished.  HENT:  Head: Normocephalic and atraumatic.  Eyes: Pupils are equal, round, and reactive to light.  Neck: Normal range of motion. Neck supple.  Musculoskeletal: Normal range of motion.  Lymphadenopathy:    She has no cervical adenopathy.  Neurological: She is alert and oriented to person, place, and time.  Skin: Skin is warm and dry.    ED Course  Procedures (including critical care time)  Labs Reviewed - No data to display No results found.   1. Neck muscle strain       MDM          Linna Hoff, MD 06/24/11 1525

## 2011-07-10 ENCOUNTER — Other Ambulatory Visit: Payer: Self-pay | Admitting: Obstetrics & Gynecology

## 2011-08-02 ENCOUNTER — Encounter (HOSPITAL_COMMUNITY): Payer: Self-pay

## 2011-08-02 ENCOUNTER — Encounter (HOSPITAL_COMMUNITY)
Admission: RE | Admit: 2011-08-02 | Discharge: 2011-08-02 | Disposition: A | Discharge status: Home / Self Care | Payer: Medicaid Other | Source: Ambulatory Visit | Attending: Obstetrics & Gynecology | Admitting: Obstetrics & Gynecology

## 2011-08-02 ENCOUNTER — Other Ambulatory Visit (HOSPITAL_COMMUNITY): Payer: Medicaid Other

## 2011-08-02 HISTORY — DX: Fracture of coccyx, initial encounter for closed fracture: S32.2XXA

## 2011-08-02 HISTORY — DX: Post-traumatic stress disorder, unspecified: F43.10

## 2011-08-02 LAB — SURGICAL PCR SCREEN
MRSA, PCR: NEGATIVE
Staphylococcus aureus: NEGATIVE

## 2011-08-02 LAB — CBC
HCT: 39.5 % (ref 36.0–46.0)
Hemoglobin: 12.8 g/dL (ref 12.0–15.0)
MCH: 28.1 pg (ref 26.0–34.0)
MCHC: 32.4 g/dL (ref 30.0–36.0)
MCV: 86.6 fL (ref 78.0–100.0)
Platelets: 255 10*3/uL (ref 150–400)
RBC: 4.56 MIL/uL (ref 3.87–5.11)
RDW: 14 % (ref 11.5–15.5)
WBC: 5.4 10*3/uL (ref 4.0–10.5)

## 2011-08-02 NOTE — Patient Instructions (Addendum)
20 Christine Vega  08/02/2011   Your procedure is scheduled on:  08/06/11  Enter through the Main Entrance of [facility] at 830 AM.  Pick up the phone at the desk and dial [phone].   Call this number if you have problems the morning of surgery: [phone]   Remember:   Do not eat food:After Midnight.  Do not drink clear liquids: After Midnight.  Take these medicines the morning of surgery with A SIP OF WATER: None per patient request   Do not wear jewelry, make-up or nail polish.  Do not wear lotions, powders, or perfumes. You may wear deodorant.  Do not shave 48 hours prior to surgery.  Do not bring valuables to the hospital.  Contacts, dentures or bridgework may not be worn into surgery.  Leave suitcase in the car. After surgery it may be brought to your room.  For patients admitted to the hospital, checkout time is 11:00 AM the day of discharge.   Patients discharged the day of surgery will not be allowed to drive home.  Name and phone number of your driver:   Special Instructions: CHG Shower Use Special Wash: 1/2 bottle night before surgery and 1/2 bottle morning of surgery.   Please read over the following fact sheets that you were given: MRSA Information

## 2011-08-02 NOTE — Pre-Procedure Instructions (Signed)
Patient refuses to take any medications the AM of surgery, thinks it would be "over medicating"  Patton Salles RN

## 2011-08-02 NOTE — Pre-Procedure Instructions (Signed)
Patients cardiac clearance reviewed and accepted by Dr. Arby Barrette. No orders given.  Cameron, R.N.

## 2011-08-06 ENCOUNTER — Encounter (HOSPITAL_COMMUNITY)
Admission: RE | Disposition: A | Discharge status: Home / Self Care | Payer: Self-pay | Source: Ambulatory Visit | Attending: Obstetrics & Gynecology

## 2011-08-06 ENCOUNTER — Encounter (HOSPITAL_COMMUNITY): Payer: Self-pay | Admitting: Anesthesiology

## 2011-08-06 ENCOUNTER — Inpatient Hospital Stay (HOSPITAL_COMMUNITY): Payer: Medicaid Other | Admitting: Anesthesiology

## 2011-08-06 ENCOUNTER — Ambulatory Visit (HOSPITAL_COMMUNITY)
Admission: RE | Admit: 2011-08-06 | Discharge: 2011-08-06 | Disposition: A | Discharge status: Home / Self Care | Payer: Medicaid Other | Source: Ambulatory Visit | Attending: Obstetrics & Gynecology | Admitting: Obstetrics & Gynecology

## 2011-08-06 ENCOUNTER — Encounter (HOSPITAL_COMMUNITY): Payer: Self-pay | Admitting: *Deleted

## 2011-08-06 DIAGNOSIS — D3910 Neoplasm of uncertain behavior of unspecified ovary: Secondary | ICD-10-CM

## 2011-08-06 DIAGNOSIS — D279 Benign neoplasm of unspecified ovary: Secondary | ICD-10-CM | POA: Insufficient documentation

## 2011-08-06 DIAGNOSIS — N83209 Unspecified ovarian cyst, unspecified side: Secondary | ICD-10-CM

## 2011-08-06 DIAGNOSIS — Z01818 Encounter for other preprocedural examination: Secondary | ICD-10-CM | POA: Insufficient documentation

## 2011-08-06 DIAGNOSIS — Z01812 Encounter for preprocedural laboratory examination: Secondary | ICD-10-CM | POA: Insufficient documentation

## 2011-08-06 HISTORY — PX: LAPAROSCOPY: SHX197

## 2011-08-06 HISTORY — PX: SALPINGOOPHORECTOMY: SHX82

## 2011-08-06 LAB — PREGNANCY, URINE: Preg Test, Ur: NEGATIVE

## 2011-08-06 SURGERY — LAPAROSCOPY OPERATIVE
Anesthesia: General | Site: Abdomen | Laterality: Right | Wound class: Clean Contaminated

## 2011-08-06 MED ORDER — LACTATED RINGERS IV SOLN
INTRAVENOUS | Status: DC
  Administered 2011-08-06 (×4): via INTRAVENOUS

## 2011-08-06 MED ORDER — METOCLOPRAMIDE HCL 5 MG/ML IJ SOLN: 10.0000 mg | Freq: Once | INTRAMUSCULAR | Status: DC | PRN

## 2011-08-06 MED ORDER — LACTATED RINGERS IV SOLN: INTRAVENOUS | Status: DC

## 2011-08-06 MED ORDER — KETOROLAC TROMETHAMINE 30 MG/ML IJ SOLN
INTRAMUSCULAR | Status: DC | PRN
  Administered 2011-08-06: 30 mg via INTRAVENOUS

## 2011-08-06 MED ORDER — LACTATED RINGERS IR SOLN
Status: DC | PRN
  Administered 2011-08-06: 3000 mL

## 2011-08-06 MED ORDER — KETOROLAC TROMETHAMINE 30 MG/ML IJ SOLN
INTRAMUSCULAR | Status: AC
  Filled 2011-08-06: qty 1

## 2011-08-06 MED ORDER — FENTANYL CITRATE 0.05 MG/ML IJ SOLN
INTRAMUSCULAR | Status: AC
  Filled 2011-08-06: qty 2

## 2011-08-06 MED ORDER — FENTANYL CITRATE 0.05 MG/ML IJ SOLN
INTRAMUSCULAR | Status: AC
Start: 2011-08-06 — End: 2011-08-06
  Filled 2011-08-06: qty 5

## 2011-08-06 MED ORDER — DEXAMETHASONE SODIUM PHOSPHATE 4 MG/ML IJ SOLN
INTRAMUSCULAR | Status: DC | PRN
  Administered 2011-08-06: 8 mg via INTRAVENOUS

## 2011-08-06 MED ORDER — GLYCOPYRROLATE 0.2 MG/ML IJ SOLN
INTRAMUSCULAR | Status: DC | PRN
  Administered 2011-08-06: .8 mg via INTRAVENOUS

## 2011-08-06 MED ORDER — HYDROMORPHONE HCL PF 1 MG/ML IJ SOLN: 0.2500 mg | INTRAMUSCULAR | Status: DC | PRN

## 2011-08-06 MED ORDER — MIDAZOLAM HCL 5 MG/5ML IJ SOLN
INTRAMUSCULAR | Status: DC | PRN
  Administered 2011-08-06: 2 mg via INTRAVENOUS

## 2011-08-06 MED ORDER — LIDOCAINE HCL (CARDIAC) 20 MG/ML IV SOLN
INTRAVENOUS | Status: AC
  Filled 2011-08-06: qty 5

## 2011-08-06 MED ORDER — ROCURONIUM BROMIDE 50 MG/5ML IV SOLN
INTRAVENOUS | Status: AC
  Filled 2011-08-06: qty 1

## 2011-08-06 MED ORDER — ONDANSETRON HCL 4 MG/2ML IJ SOLN
INTRAMUSCULAR | Status: DC | PRN
  Administered 2011-08-06: 4 mg via INTRAVENOUS

## 2011-08-06 MED ORDER — FENTANYL CITRATE 0.05 MG/ML IJ SOLN
INTRAMUSCULAR | Status: AC
  Filled 2011-08-06: qty 5

## 2011-08-06 MED ORDER — BUPIVACAINE HCL (PF) 0.25 % IJ SOLN
INTRAMUSCULAR | Status: DC | PRN
  Administered 2011-08-06: 10 mL

## 2011-08-06 MED ORDER — NEOSTIGMINE METHYLSULFATE 1 MG/ML IJ SOLN
INTRAMUSCULAR | Status: AC
  Filled 2011-08-06: qty 10

## 2011-08-06 MED ORDER — PROPOFOL 10 MG/ML IV EMUL
INTRAVENOUS | Status: DC | PRN
  Administered 2011-08-06: 200 mg via INTRAVENOUS

## 2011-08-06 MED ORDER — GLYCOPYRROLATE 0.2 MG/ML IJ SOLN
INTRAMUSCULAR | Status: AC
  Filled 2011-08-06: qty 2

## 2011-08-06 MED ORDER — DEXAMETHASONE SODIUM PHOSPHATE 10 MG/ML IJ SOLN
INTRAMUSCULAR | Status: AC
  Filled 2011-08-06: qty 1

## 2011-08-06 MED ORDER — OXYCODONE-ACETAMINOPHEN 5-325 MG PO TABS: 1.0000 | ORAL_TABLET | ORAL | Status: AC | PRN

## 2011-08-06 MED ORDER — ONDANSETRON HCL 4 MG/2ML IJ SOLN
INTRAMUSCULAR | Status: AC
  Filled 2011-08-06: qty 2

## 2011-08-06 MED ORDER — MIDAZOLAM HCL 2 MG/2ML IJ SOLN
INTRAMUSCULAR | Status: AC
  Filled 2011-08-06: qty 2

## 2011-08-06 MED ORDER — MEPERIDINE HCL 25 MG/ML IJ SOLN: 6.2500 mg | INTRAMUSCULAR | Status: DC | PRN

## 2011-08-06 MED ORDER — BUPIVACAINE HCL (PF) 0.25 % IJ SOLN
INTRAMUSCULAR | Status: AC
  Filled 2011-08-06: qty 30

## 2011-08-06 MED ORDER — PROPOFOL 10 MG/ML IV EMUL
INTRAVENOUS | Status: AC
  Filled 2011-08-06: qty 20

## 2011-08-06 MED ORDER — FENTANYL CITRATE 0.05 MG/ML IJ SOLN
INTRAMUSCULAR | Status: DC | PRN
  Administered 2011-08-06 (×2): 50 ug via INTRAVENOUS
  Administered 2011-08-06 (×3): 100 ug via INTRAVENOUS
  Administered 2011-08-06: 50 ug via INTRAVENOUS
  Administered 2011-08-06: 100 ug via INTRAVENOUS

## 2011-08-06 MED ORDER — LIDOCAINE HCL (CARDIAC) 20 MG/ML IV SOLN
INTRAVENOUS | Status: DC | PRN
  Administered 2011-08-06: 80 mg via INTRAVENOUS

## 2011-08-06 MED ORDER — NEOSTIGMINE METHYLSULFATE 1 MG/ML IJ SOLN
INTRAMUSCULAR | Status: DC | PRN
  Administered 2011-08-06: 2 mg via INTRAVENOUS

## 2011-08-06 MED ORDER — ROCURONIUM BROMIDE 100 MG/10ML IV SOLN
INTRAVENOUS | Status: DC | PRN
  Administered 2011-08-06: 50 mg via INTRAVENOUS

## 2011-08-06 SURGICAL SUPPLY — 28 items
CABLE HIGH FREQUENCY MONO STRZ (ELECTRODE) IMPLANT
CATH ROBINSON RED A/P 16FR (CATHETERS) IMPLANT
CHLORAPREP W/TINT 26ML (MISCELLANEOUS) ×3 IMPLANT
CLOTH BEACON ORANGE TIMEOUT ST (SAFETY) ×3 IMPLANT
DERMABOND ADVANCED (GAUZE/BANDAGES/DRESSINGS) ×1
DERMABOND ADVANCED .7 DNX12 (GAUZE/BANDAGES/DRESSINGS) ×2 IMPLANT
GLOVE BIO SURGEON STRL SZ 6.5 (GLOVE) ×3 IMPLANT
GLOVE BIO SURGEON STRL SZ7 (GLOVE) ×3 IMPLANT
GLOVE BIOGEL PI IND STRL 7.0 (GLOVE) ×4 IMPLANT
GLOVE BIOGEL PI INDICATOR 7.0 (GLOVE) ×2
GOWN PREVENTION PLUS LG XLONG (DISPOSABLE) ×6 IMPLANT
NEEDLE INSUFFLATION 14GA 120MM (NEEDLE) ×3 IMPLANT
NS IRRIG 1000ML POUR BTL (IV SOLUTION) ×3 IMPLANT
PACK LAPAROSCOPY BASIN (CUSTOM PROCEDURE TRAY) ×3 IMPLANT
POUCH SPECIMEN RETRIEVAL 10MM (ENDOMECHANICALS) ×6 IMPLANT
PROTECTOR NERVE ULNAR (MISCELLANEOUS) ×3 IMPLANT
SCALPEL HARMONIC ACE (MISCELLANEOUS) ×3 IMPLANT
SET IRRIG TUBING LAPAROSCOPIC (IRRIGATION / IRRIGATOR) ×3 IMPLANT
STRIP CLOSURE SKIN 1/2X4 (GAUZE/BANDAGES/DRESSINGS) ×3 IMPLANT
SUT VICRYL 0 UR6 27IN ABS (SUTURE) ×3 IMPLANT
SUT VICRYL 4-0 PS2 18IN ABS (SUTURE) ×6 IMPLANT
TOWEL OR 17X24 6PK STRL BLUE (TOWEL DISPOSABLE) ×6 IMPLANT
TRAY FOLEY CATH 14FR (SET/KITS/TRAYS/PACK) ×3 IMPLANT
TROCAR BALLN 12MMX100 BLUNT (TROCAR) ×3 IMPLANT
TROCAR XCEL NON-BLD 5MMX100MML (ENDOMECHANICALS) ×3 IMPLANT
TROCAR Z-THREAD BLADED 11X100M (TROCAR) ×3 IMPLANT
WARMER LAPAROSCOPE (MISCELLANEOUS) ×3 IMPLANT
WATER STERILE IRR 1000ML POUR (IV SOLUTION) ×3 IMPLANT

## 2011-08-06 NOTE — Anesthesia Procedure Notes (Signed)
Procedure Name: Intubation Date/Time: 08/06/2011 10:18 AM Performed by: Kendal Hymen Pre-anesthesia Checklist: Emergency Drugs available, Suction available, Timeout performed, Patient identified and Patient being monitored Patient Re-evaluated:Patient Re-evaluated prior to inductionOxygen Delivery Method: Circle system utilized Preoxygenation: Pre-oxygenation with 100% oxygen Intubation Type: IV induction Ventilation: Mask ventilation without difficulty Laryngoscope Size: Miller and 3 Grade View: Grade I Tube type: Oral Tube size: 7.0 mm Number of attempts: 1 Airway Equipment and Method: Stylet Placement Confirmation: ETT inserted through vocal cords under direct vision,  positive ETCO2 and breath sounds checked- equal and bilateral Secured at: 22 cm Tube secured with: Tape Dental Injury: Teeth and Oropharynx as per pre-operative assessment  Comments: upper front gold cap intact after intubation

## 2011-08-06 NOTE — Anesthesia Preprocedure Evaluation (Addendum)
Anesthesia Evaluation  Patient identified by MRN, date of birth, ID band Patient awake    Reviewed: Allergy & Precautions, H&P , NPO status , Patient's Chart, lab work & pertinent test results  Airway Mallampati: III TM Distance: >3 FB Neck ROM: Full    Dental No notable dental hx. (+) Teeth Intact and Caps,    Pulmonary  breath sounds clear to auscultation  Pulmonary exam normal       Cardiovascular hypertension, Pt. on medications + Valvular Problems/Murmurs MR Rhythm:Regular Rate:Normal     Neuro/Psych  Headaches, Seizures -, Well Controlled,  Anxiety Depression PTSD Neuromuscular disease    GI/Hepatic negative GI ROS, Neg liver ROS,   Endo/Other  Hyperlipidemia  Renal/GU negative Renal ROS     Musculoskeletal negative musculoskeletal ROS (+)   Abdominal   Peds  Hematology  (+) Blood dyscrasia, anemia ,   Anesthesia Other Findings   Reproductive/Obstetrics Complex Right Ovarian Cyst                         Anesthesia Physical Anesthesia Plan  ASA: II  Anesthesia Plan: General   Post-op Pain Management:    Induction: Intravenous  Airway Management Planned: Oral ETT  Additional Equipment:   Intra-op Plan:   Post-operative Plan: Extubation in OR  Informed Consent: I have reviewed the patients History and Physical, chart, labs and discussed the procedure including the risks, benefits and alternatives for the proposed anesthesia with the patient or authorized representative who has indicated his/her understanding and acceptance.   Dental advisory given  Plan Discussed with: Anesthesiologist, CRNA and Surgeon  Anesthesia Plan Comments:         Anesthesia Quick Evaluation

## 2011-08-06 NOTE — H&P (Signed)
Patient ID: Christine Vega, female DOB: 10-Mar-1971, 41 y.o. MRN: [id number]   HPI Patient's last menstrual period was 07/16/2011.    [id number]   The patient comes today in followup after an ultrasound was repeated in August. She had an ultrasound in March that showed a right ovarian cystic lesion about 5 cm. Ultrasound in August 30 confirmed a complex right ovarian cyst 6.1 x 5.2 cm which is suspicious for a dermoid. [place] 05/18/11 showed mild increase in size to 7 cm. Patient has occasional pain but her menstrual cycles are regular and not heavy or bothersome. Ultrasound showed a normal trilaminar endometrium. Offered laparoscopy and removal of the right ovarian cyst. She understands this might require removal of the ovary. The risk of a laparotomy was also discussed. Anesthesia complications and the risks of bleeding infection and abdominal organ damage was also discussed her questions were answered. This procedure is scheduled today. She was cleared by cardiology after evaluation for exercise intolerance. Past Medical History    Diagnosis   Date    .   Migraines      .   Anemia      .   Depression      .   Endometriosis      .   Hyperlipidemia          Past Surgical History    Procedure   Date    .   Cholecystectomy      .   Hernia repair      .   Tubal ligation       laparoscopy performed in [place], [place] in 2010 with a diagnosis of endometriosis. She states that she received no additional therapy after surgery. Do not have records of that procedure.   Current Outpatient Prescriptions on File Prior to Visit    Medication   Sig   Dispense   Refill    .   divalproex (DEPAKOTE) 500 MG 24 hr tablet   Take 500 mg by mouth 2 (two) times daily.            No current facility-administered medications on file prior to visit.        Allergies    Allergen   Reactions    .   Zithromax (Azithromycin)   Itching        History        Social History    .   Marital Status:    Legally Separated        Spouse Name:   N/A        Number of Children:   N/A    .   Years of Education:   N/A        Occupational History    .   Not on file.        Social History Main Topics    .   Smoking status:   Never Smoker    .   Smokeless tobacco:   Never Used    .   Alcohol Use:   No    .   Drug Use:   No    .   Sexually Active:   Yes        Birth Control/ Protection:   Surgical        Other Topics   Concern    .   Not on file        Social History Narrative    .  No narrative on file       No family history on file.   Review of Systems   No vaginal bleeding vaginal discharge or pain reported today.    Filed Vitals:   08/06/11 0834  BP: 129/83  Pulse: 104  Temp: 98.2 F (36.8 C)  TempSrc: Oral  Resp: 18  SpO2: 100%     No acute distress pleasant affect   Chest clear Heart RRR Abdomen not tender, non distended, no mass, transverse keloid scar at umbilicus from hernia repair Pap smear in April 2011 was normal   Extremity no swelling, tenderness   *RADIOLOGY REPORT*    Clinical Data: Dysfunctional uterine bleeding and pelvic pain. LMP   11/29/2010   TRANSVAGINAL ULTRASOUND OF PELVIS   Technique: Transvaginal ultrasound examination of the pelvis was   performed including evaluation of the uterus, ovaries, adnexal   regions, and pelvic cul-de-sac.   Comparison: 06/26/2010   Findings:   Uterus: The uterus demonstrates a sagittal length of 10.9 cm, an   AP depth of 5.5 cm and a transverse width of 6.1 cm. A homogeneous   myometrium is noted.   Endometrium: Measures 1.1 cm in diameter and has a tri- layered   appearance compatible with a periovulatory endometrial stripe and   corresponding with the patient's given LMP of 11/29/2010   Right ovary: The right ovary measures 3.1 x 2.4 by 3.5 cm and   contains a dominant follicle. Directly adjacent to and contiguous   with the right ovary is a complex cystic lesion measuring 6.1 x 5.2   x 6.0 cm. This  contains echogenic soft tissue and linear internal   echoes. No evidence for associated intralesional flow is seen.   This is similar in appearance to the prior exam but the overall   size is slightly larger. The appearance is suspicious for but not   confirmatory for an ovarian dermoid as it is not clearly ovarian   and may be extraovarian.   Left ovary: Has a normal appearance measuring 3.5 x 2.1 x 2.5 cm.   Other Findings: A small amount of simple free fluid is identified   adjacent to the right ovary   IMPRESSION:   Persistent complex right adnexal mass adjacentto the right ovary.   This may be separate from the right ovary or exophytic from the   right ovary. Imaging characteristics are suspicious for a dermoid   but there has been some interval increase in size and as this   appearance is not confirmatory for that diagnosis, further   evaluation with pelvic MRI with contrast is recommended.   Original Report Authenticated By: Bertha Stakes, M.D.    *RADIOLOGY REPORT*  Clinical Data: Left pelvic pain. Clinical suspicion for ovarian  torsion. Known right ovarian mass.  TRANSABDOMINAL AND TRANSVAGINAL ULTRASOUND OF PELVIS  DOPPLER ULTRASOUND OF OVARIES  Technique: Both transabdominal and transvaginal ultrasound  examinations of the pelvis were performed. Transabdominal technique  was performed for global imaging of the pelvis including uterus,  ovaries, adnexal regions, and pelvic cul-de-sac.  It was necessary to proceed with endovaginal exam following the  transabdominal exam to visualize the endometrium, ovaries, and  right adnexal mass.  Color and duplex Doppler ultrasound was utilized to evaluate blood  flow to the ovaries.  Comparison: 12/14/2010  Findings:  Uterus: 9.8 x 6.0 x 5.6 cm. No fibroids or other uterine masses  are identified.  Endometrium: 8 mm double layer thickness measured transvaginally.  No focal  lesion visualized.  Right ovary: A complex cystic mass  is again seen in the right  adnexa. This contains numerous internal fine septations and  hyperechoic areas, although no internal blood flow is seen on color  Doppler ultrasound. This measures 7.2 x 6.1 by 7.5 cm, and is  mildly increased in size compared to prior exam. Color Doppler  ultrasound does show blood flow in the peripheral wall of this  effusion, which may represent ovarian tissue.  Left ovary: Normal appearance/no adnexal mass  Pulsed Doppler evaluation demonstrates normal low-resistance  arterial and venous waveforms in the left ovary and the peripheral  wall of the right adnexal cystic mass.  Other: No free fluid.  IMPRESSION:  1. Mild increase in size of 7 cm complex cystic right adnexal  mass, suspicious for cystic ovarian neoplasm. Recommend surgical  evaluation, or further characterization by pelvic MRI without and  with contrast.  2. No sonographic evidence for ovarian torsion.  3. Normal appearance of uterus and left ovary. No free fluid.  Original Report Authenticated By: Danae Orleans, M.D.       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 CA 125 performed in August was normal. She is scheduled for diagnostic laparoscopy and removal of right ovarian cyst possible right oophorectomy.   Scheryl Darter MD   08/06/2011

## 2011-08-06 NOTE — Anesthesia Postprocedure Evaluation (Signed)
Anesthesia Post Note  Patient: Christine Vega  Procedure(s) Performed: Procedure(s) (LRB): LAPAROSCOPY OPERATIVE (N/A) SALPINGO OOPHERECTOMY (Right)  Anesthesia type: General  Patient location: PACU  Post pain: Pain level controlled  Post assessment: Post-op Vital signs reviewed  Last Vitals:  Filed Vitals:   08/06/11 1310  BP: 116/60  Pulse: 113  Temp: 36.9 C  Resp: 17    Post vital signs: Reviewed  Level of consciousness: sedated  Complications: No apparent anesthesia complicationsfj

## 2011-08-06 NOTE — Transfer of Care (Signed)
Immediate Anesthesia Transfer of Care Note  Patient: Christine Vega  Procedure(s) Performed: Procedure(s) (LRB): LAPAROSCOPY OPERATIVE (N/A) SALPINGO OOPHERECTOMY (Right)  Patient Location: PACU  Anesthesia Type: General  Level of Consciousness: awake, sedated and patient cooperative  Airway & Oxygen Therapy: Patient Spontanous Breathing and Patient connected to nasal cannula oxygen  Post-op Assessment: Report given to PACU RN and Post -op Vital signs reviewed and stable  Post vital signs: Reviewed and stable  Complications: No apparent anesthesia complications

## 2011-08-06 NOTE — Op Note (Signed)
Procedure: Laparoscopy, right salpingo-oophorectomy Preoperative diagnosis: Complex right ovarian cyst, possible dermoid Postoperative diagnosis: Same Surgeon: Dr. Scheryl Darter Assistant: Myrene Galas Anesthesia: Gen. endotracheal Blood loss: Less than 100 mL Specimen: Right ovary and fallopian tube with large dermoid cyst Complications: None Drains: None Counts: Correct  Patient gave written consent for laparoscopy and right ovarian cystectomy with possible oophorectomy due to a large complex ovarian cyst. Patient identification was confirmed and she was brought to the operating room and general anesthesia was induced. She was placed in dorsal lithotomy position. The perineum and vagina and abdomen were sterilely prepped and draped. A Hulka tenaculum was placed on the cervix. A Foley catheter was placed. #11 blade was used to make a Vertical midline incision about 1/2 cm long above the site of the previous umbilical hernia repair. The fascia and peritoneum were entered sharply. 0 Vicryl sutures were placed in the fascia and the Hassan trocar was placed. CO2 was insufflated. Laparoscope was inserted with video camera and use. There were adhesions of small bowel to anterior abdominal wall. There were also some omental adhesions in the right upper quadrant. Adhesions were rather dense and still had a clear window to the the pelvis we did not do lysis of adhesions area patient was placed in Trendelenburg position. Uterus appeared normal. Left tube and ovary appeared normal. There right ovary appeared to be about 10 x 6 cm in total. The wall was smooth. In were no adhesions of the ovary. Second punctures were placed with a 5 mm port and left lower quadrant and a 10 mm port in the right lower quadrant. The harmonic scalpel was used to cut across the utero-ovarian ligament proximal fallopian tube and the round ligament on the right side. The ovarian mass was dissected away from the sidewall. This is size  the mass was initially difficult to visualize the infundibulopelvic ligament well. The right ureter was seen in a normal course and not in the operative field. Once the infundibulopelvic ligament was visualized this was opened and cauterized in cut. Specimen was detached from all pedicles. Good hemostasis was seen. The long laparoscopic needle was used to drain the 200 mL of cloudy L. of fluid. The mass was then opened which revealed a cavity containing hair and sebaceous material. The Endopouch was placed through the port in the right lower quadrant and the specimen was placed. The specimen was removed through the port in the right lower quadrant. It was necessary to take the specimen out in pieces to allow to pass through the opening. The specimen was sent to pathology. The pedicles right sidewall were inspected and were hemostatic. The pelvis was copiously irrigated with approximately 3 L of saline and this was suctioned. The port in the right lower quadrant was removed and the port was closed 0 Vicryl suture in the fascia. This was done under direct laparoscopic visualization. Pneumoperitoneum was released and the others was removed. The fascia the umbilicus was closed by tying the Vicryl sutures that were present. 4-0 Vicryl was used to close the skin at the umbilicus and in the right lower quadrant and Dermabond was applied. The Hulka tenaculum was removed. Patient tolerated the procedure well without complications. She was brought in stable condition to recover. A Foley catheter was removed in the OR.  Scheryl Darter 08/06/2011 1:07 PM

## 2011-08-06 NOTE — Discharge Instructions (Signed)
Diagnostic Laparoscopy Laparoscopy is a surgical procedure. It is used to diagnose and treat diseases inside the belly(abdomen). It is usually a brief, common, and relatively simple procedure. The laparoscopeis a thin, lighted, pencil-sized instrument. It is like a telescope. It is inserted into your abdomen through a small cut (incision). Your caregiver can look at the organs inside your body through this instrument. He or she can see if there is anything abnormal. Laparoscopy can be done either in a hospital or outpatient clinic. You may be given a mild sedative to help you relax before the procedure. Once in the operating room, you will be given a drug to make you sleep (general anesthesia). Laparoscopy usually lasts less than 1 hour. After the procedure, you will be monitored in a recovery area until you are stable and doing well. Once you are home, it will take 2 to 3 days to fully recover. RISKS AND COMPLICATIONS  Laparoscopy has relatively few risks. Your caregiver will discuss the risks with you before the procedure. Some problems that can occur include:  Infection.   Bleeding.   Damage to other organs.   Anesthetic side effects.  PROCEDURE Once you receive anesthesia, your surgeon inflates the abdomen with a harmless gas (carbon dioxide). This makes the organs easier to see. The laparoscope is inserted into the abdomen through a small incision. This allows your surgeon to see into the abdomen. Other small instruments are also inserted into the abdomen through other small openings. Many surgeons attach a video camera to the laparoscope to enlarge the view. During a diagnostic laparoscopy, the surgeon may be looking for inflammation, infection, or cancer. Your surgeon may take tissue samples(biopsies). The samples are sent to a specialist in looking at cells and tissue samples (pathologist). The pathologist examines them under a microscope. Biopsies can help to diagnose or confirm a  disease. AFTER THE PROCEDURE   The gas is released from inside the abdomen.   The incisions are closed with stitches (sutures). Because these incisions are small (usually less than 1/2 inch), there is usually minimal discomfort after the procedure. There may be some mild discomfort in the throat. This is from the tube placed in the throat while you were sleeping. You may have some mild abdominal discomfort. There may also be discomfort from the instrument placement incisions in the abdomen.   The recovery time is shortened as long as there are no complications.   You will rest in a recovery room until stable and doing well. As long as there are no complications, you may be allowed to go home.  FINDING OUT THE RESULTS OF YOUR TEST Not all test results are available during your visit. If your test results are not back during the visit, make an appointment with your caregiver to find out the results. Do not assume everything is normal if you have not heard from your caregiver or the medical facility. It is important for you to follow up on all of your test results. HOME CARE INSTRUCTIONS   Take all medicines as directed.   Only take over-the-counter or prescription medicines for pain, discomfort, or fever as directed by your caregiver.   Resume daily activities as directed.   Showers are preferred over baths.   You may resume sexual activities in 1 week or as directed.   Do not drive while taking narcotics.  SEEK MEDICAL CARE IF:   There is increasing abdominal pain.   There is new pain in the shoulders (shoulder strap areas).     You feel lightheaded or faint.   You have the chills.   You or your child has an oral temperature above 102 F (38.9 C).   There is pus-like (purulent) drainage from any of the wounds.   You are unable to pass gas or have a bowel movement.   You feel sick to your stomach (nauseous) or throw up (vomit).  MAKE SURE YOU:   Understand these instructions.    Will watch your condition.   Will get help right away if you are not doing well or get worse.  Document Released: 07/09/2000 Document Revised: 03/22/2011 Document Reviewed: 04/02/2007 [facility] Patient Information 2012 [place], [place].DISCHARGE INSTRUCTIONS: Laparoscopy  The following instructions have been prepared to help you care for yourself upon your return home today.  Wound care: Marland Kitchen Do not get the incision wet for the first 24 hours. The incision should be kept clean and dry. . The Band-Aids or dressings may be removed the day after surgery. . Should the incision become sore, red, and swollen after the first week, check with your doctor.  Personal hygiene: . Shower the day after your procedure.  Activity and limitations: . Do NOT drive or operate any equipment today. . Do NOT lift anything more than 15 pounds for 2-3 weeks after surgery. . Do NOT rest in bed all day. . Walking is encouraged. Walk each day, starting slowly with 5-minute walks 3 or 4 times a day. Slowly increase the length of your walks. . Walk up and down stairs slowly. . Do NOT do strenuous activities, such as golfing, playing tennis, bowling, running, biking, weight lifting, gardening, mowing, or vacuuming for 2-4 weeks. Ask your doctor when it is okay to start.  Diet: Eat a light meal as desired this evening. You may resume your usual diet tomorrow.  Return to work: This is dependent on the type of work you do. For the most part you can return to a desk job within a week of surgery. If you are more active at work, please discuss this with your doctor.  What to expect after your surgery: You may have a slight burning sensation when you urinate on the first day. You may have a very small amount of blood in the urine. Expect to have a small amount of vaginal discharge/light bleeding for 1-2 weeks. It is not unusual to have abdominal soreness and bruising for up to 2 weeks. You may be tired and need more rest for  about 1 week. You may experience shoulder pain for 24-72 hours. Lying flat in bed may relieve it.  Call your doctor for any of the following: . Develop a fever of 100.4 or greater . Inability to urinate 6 hours after discharge from hospital . Severe pain not relieved by pain medications . Persistent of heavy bleeding at incision site . Redness or swelling around incision site after a week . Increasing nausea or vomiting  Patient Signature________________________________________ Nurse Signature_________________________________________ DISCHARGE INSTRUCTIONS: Laparoscopy  The following instructions have been prepared to help you care for yourself upon your return home today.  Wound care: Marland Kitchen Do not get the incision wet for the first 24 hours. The incision should be kept clean and dry. . The Band-Aids or dressings may be removed the day after surgery. . Should the incision become sore, red, and swollen after the first week, check with your doctor.  Personal hygiene: . Shower the day after your procedure.  Activity and limitations: . Do NOT drive or operate any equipment  today. . Do NOT lift anything more than 15 pounds for 2-3 weeks after surgery. . Do NOT rest in bed all day. . Walking is encouraged. Walk each day, starting slowly with 5-minute walks 3 or 4 times a day. Slowly increase the length of your walks. . Walk up and down stairs slowly. . Do NOT do strenuous activities, such as golfing, playing tennis, bowling, running, biking, weight lifting, gardening, mowing, or vacuuming for 2-4 weeks. Ask your doctor when it is okay to start.  Diet: Eat a light meal as desired this evening. You may resume your usual diet tomorrow.  Return to work: This is dependent on the type of work you do. For the most part you can return to a desk job within a week of surgery. If you are more active at work, please discuss this with your doctor.  What to expect after your surgery: You may have a slight  burning sensation when you urinate on the first day. You may have a very small amount of blood in the urine. Expect to have a small amount of vaginal discharge/light bleeding for 1-2 weeks. It is not unusual to have abdominal soreness and bruising for up to 2 weeks. You may be tired and need more rest for about 1 week. You may experience shoulder pain for 24-72 hours. Lying flat in bed may relieve it.  Call your doctor for any of the following: . Develop a fever of 100.4 or greater . Inability to urinate 6 hours after discharge from hospital . Severe pain not relieved by pain medications . Persistent of heavy bleeding at incision site . Redness or swelling around incision site after a week . Increasing nausea or vomiting  Patient Signature________________________________________ Nurse Signature_________________________________________

## 2011-08-07 ENCOUNTER — Encounter (HOSPITAL_COMMUNITY): Payer: Self-pay | Admitting: Obstetrics & Gynecology

## 2011-08-20 ENCOUNTER — Encounter: Payer: Self-pay | Admitting: Obstetrics & Gynecology

## 2011-08-30 ENCOUNTER — Ambulatory Visit: Payer: Medicaid Other | Admitting: Obstetrics & Gynecology

## 2011-09-20 ENCOUNTER — Ambulatory Visit: Payer: Medicaid Other | Admitting: Obstetrics & Gynecology

## 2011-09-24 ENCOUNTER — Ambulatory Visit: Payer: Medicaid Other | Admitting: Obstetrics & Gynecology

## 2011-10-21 IMAGING — US US PELV - US TRANSVAGINAL
1 series · 13 of 25 positions shown · non-contrast
Comparison: None

CLINICAL DATA: Bleeding, pelvic pain.  Negative urine pregnancy
test.  History of endometriosis, tubal ligation, endometrial
resection.  LMP 06/24/2010.

TRANSABDOMINAL AND TRANSVAGINAL ULTRASOUND OF PELVIS
TECHNIQUE: Both transabdominal and transvaginal ultrasound
examinations of the pelvis were performed. Transabdominal technique
was performed for global imaging of the pelvis including uterus,
ovaries, adnexal regions, and pelvic cul-de-sac.
It was necessary to proceed with endovaginal exam following the
transabdominal exam to visualize the uterine and adnexal mass
detail.

[Series 1: us pelvis complete + us transvaginal non-ob · 74 acquisitions, 13 frames shown]
[im 1/74]
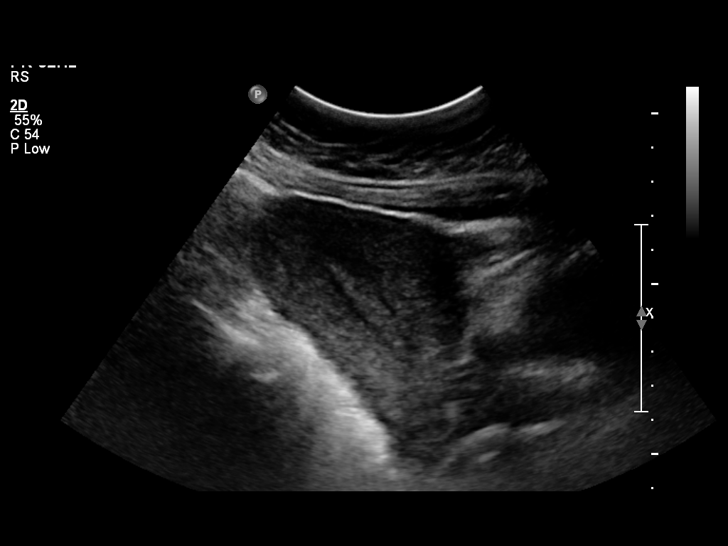
[im 7/74]
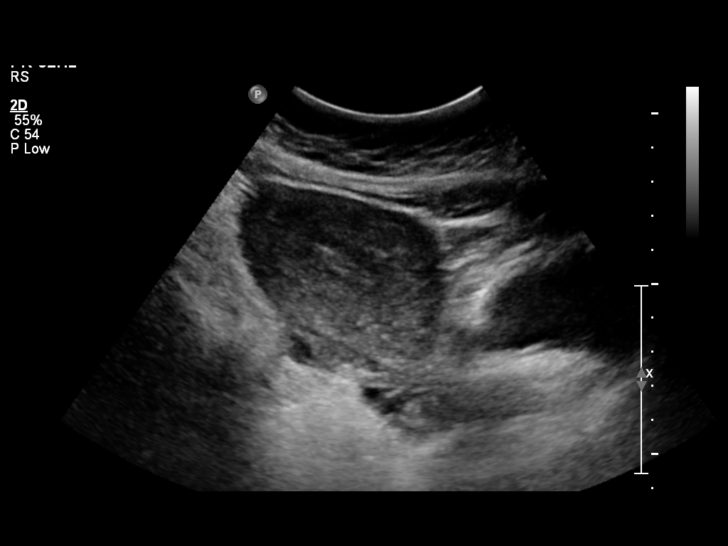
[im 13/74]
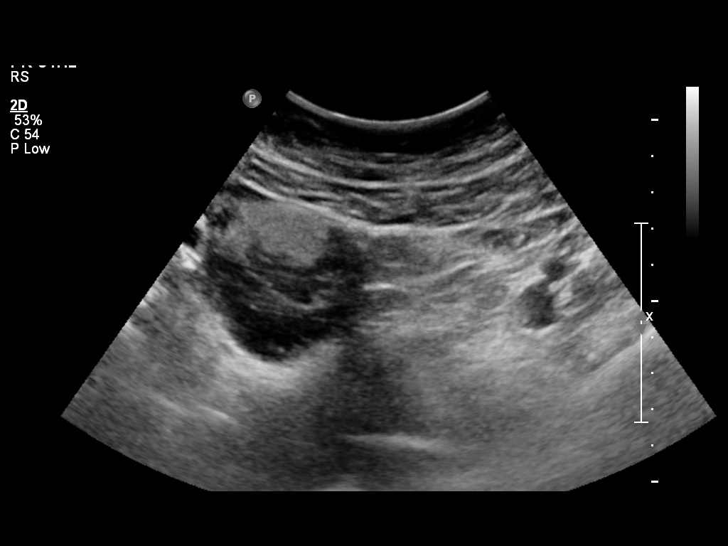
[im 19/74]
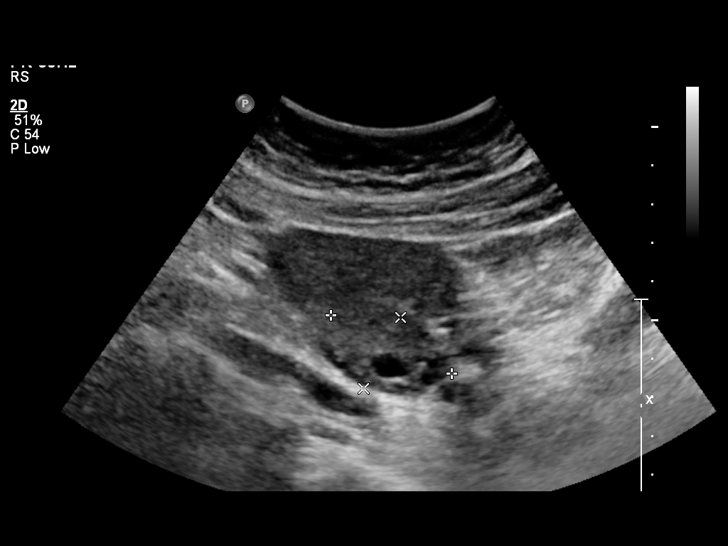
[im 25/74]
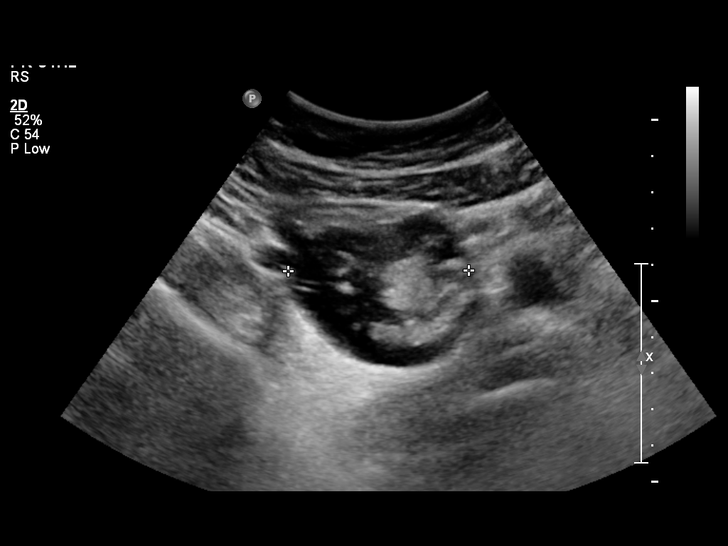
[im 31/74]
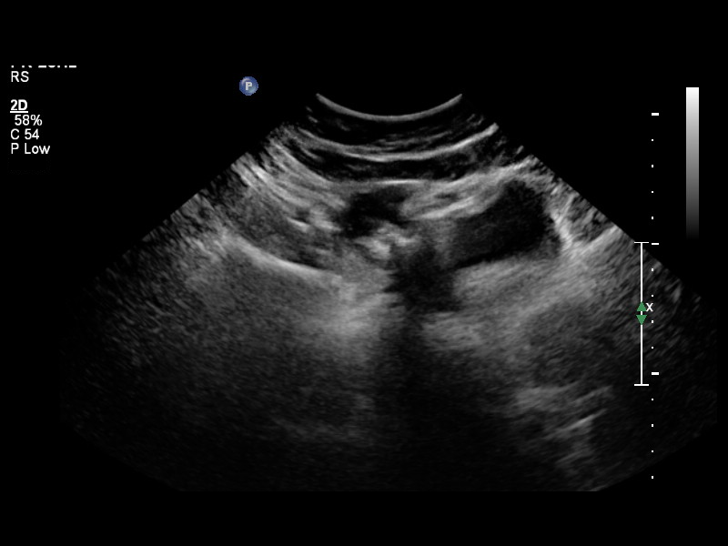
[im 37/74]
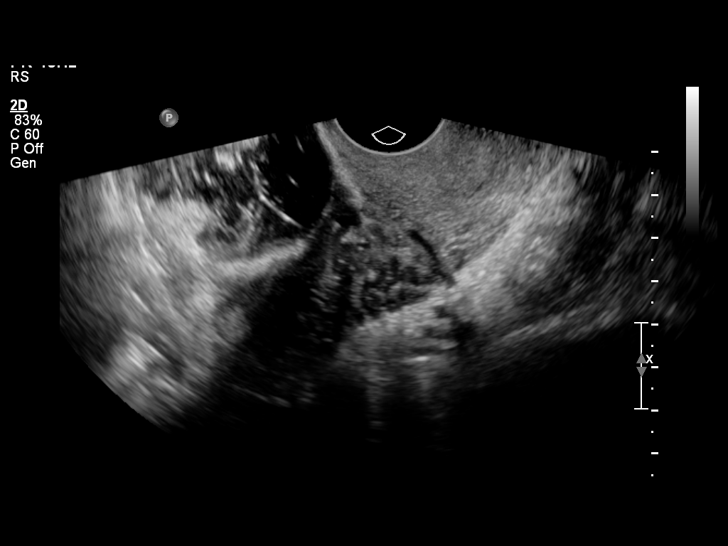
[im 43/74]
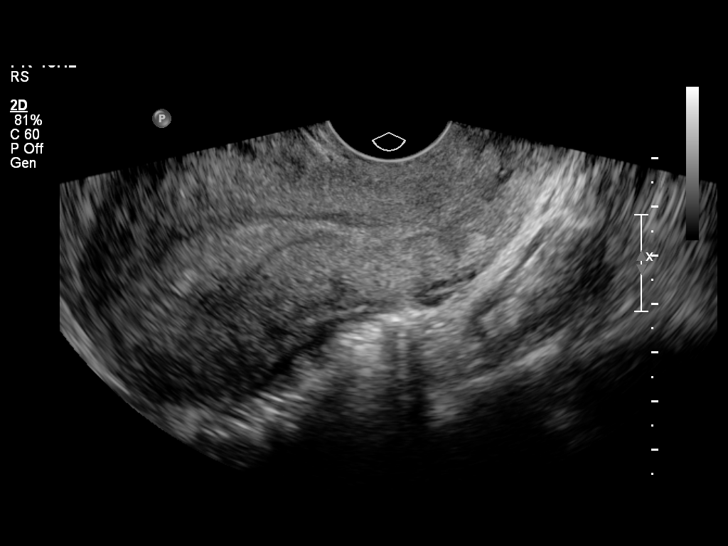
[im 49/74]
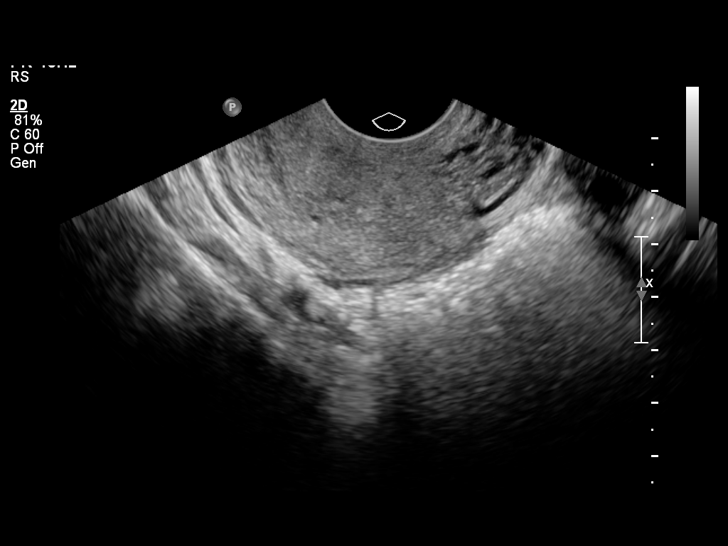
[im 55/74]
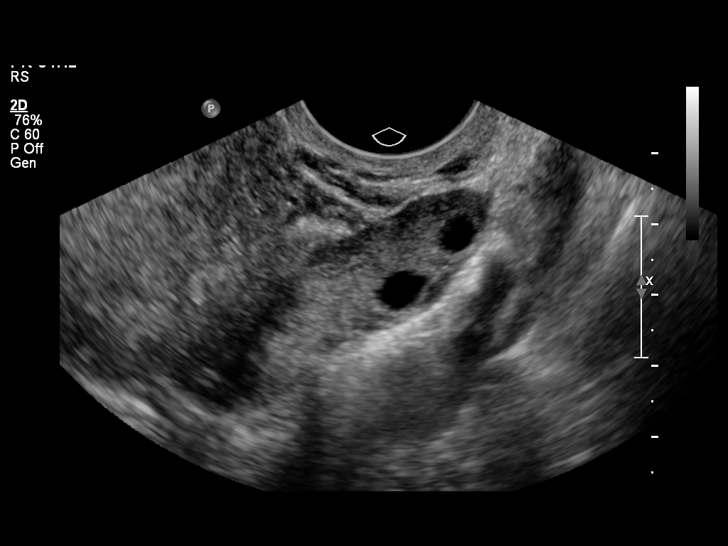
[im 61/74]
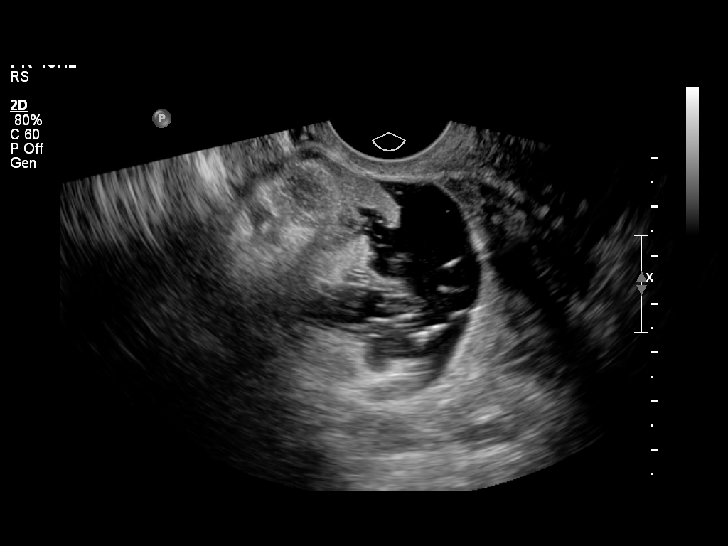
[im 67/74]
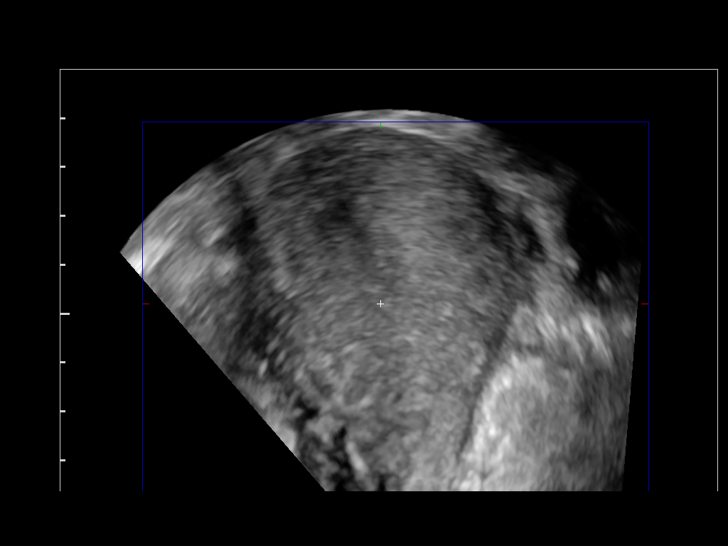
[im 74/74]
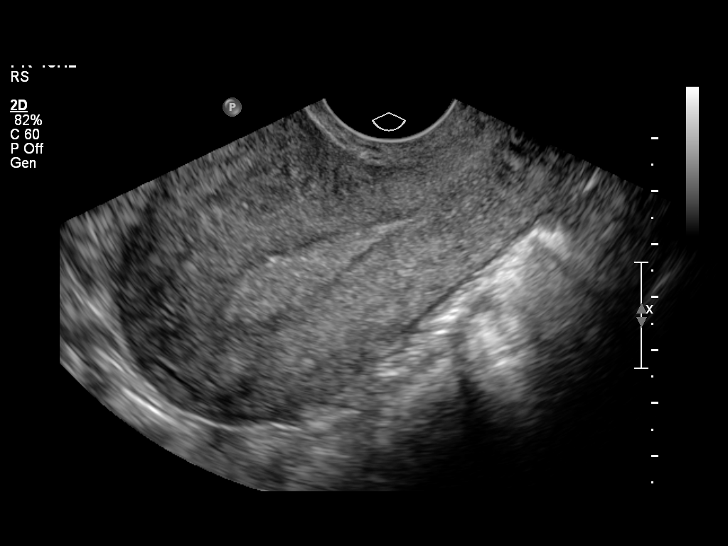

[13 of 25 positions shown; findings below may reference images not displayed]

FINDINGS: Uterus:  Measures 10.0 x 5.6 x 6.4.  No fibroids or other uterine
masses identified.

Endometrium:  Measures 11 mm, within normal limits.

Right ovary:   Within the right adnexa, there is a complex mass not
distinguishable from the ovary measuring 5.4 x 4.6 x 5 cm.  This
contains internal echoes and hyperechoic material.

Left ovary:  4.2 x 1.4 x 2.1 cm.  Normal appearance/no adnexal
mass.

Other findings:  There is no free pelvic fluid.
IMPRESSION: 1.  Normal-appearing uterus and left ovary.
2.  Complex right adnexal cystic mass.  Differential diagnosis
includes dermoid, benign or malignant ovarian neoplasm.  Tubal
ovarian abscess or endometrioma felt to be much less likely.Further
evaluation with ovarian protocol pelvic MRI with and without
contrastis recommended.

## 2011-10-22 ENCOUNTER — Ambulatory Visit: Payer: Medicaid Other | Admitting: Obstetrics & Gynecology

## 2011-11-08 ENCOUNTER — Ambulatory Visit (INDEPENDENT_AMBULATORY_CARE_PROVIDER_SITE_OTHER): Payer: Self-pay | Admitting: Obstetrics & Gynecology

## 2011-11-08 VITALS — BP 115/76 | HR 93 | Temp 98.3°F | Ht 71.5 in | Wt 203.0 lb

## 2011-11-08 DIAGNOSIS — Z09 Encounter for follow-up examination after completed treatment for conditions other than malignant neoplasm: Secondary | ICD-10-CM

## 2011-11-08 NOTE — Patient Instructions (Addendum)
Mammography Mammography is an X-ray of the breasts to look for changes that are not normal. The X-ray image is called a mammogram. This procedure can screen for breast cancer, can detect cancer early, and can diagnose cancer.  LET YOUR CAREGIVER KNOW ABOUT:  Breast implants.   Previous breast disease, biopsy, or surgery.   If you are breastfeeding.   Medicines taken, including vitamins, herbs, eyedrops, over-the-counter medicines, and creams.   Use of steroids (by mouth or creams).   Possibility of pregnancy, if this applies.  RISKS AND COMPLICATIONS  Exposure to radiation, but at very low levels.   The results may be misinterpreted.   The results may not be accurate.   Mammography may lead to further tests.   Mammography may not catch certain cancers.  BEFORE THE PROCEDURE  Schedule your test about 7 days after your menstrual period. This is when your breasts are the least tender and have signs of hormone changes.   If you have had a mammography done at a different facility in the past, get the mammogram X-rays or have them sent to your current exam facility in order to compare them.   Wash your breasts and under your arms the day of the test.   Do not wear deodorants, perfumes, or powders anywhere on your body.   Wear clothes that you can change in and out of easily.  PROCEDURE Relax as much as possible during the test. Any discomfort during the test will be very brief. The test should take less than 30 minutes. The following will happen:  You will undress from the waist up and put on a gown.   You will stand in front of the X-ray machine.   Each breast will be placed between 2 plastic or glass plates. The plates will compress your breast for a few seconds.   X-rays will be taken from different angles of the breast.  AFTER THE PROCEDURE  The mammogram will be examined.   Depending on the quality of the images, you may need to repeat certain parts of the test.    Ask when your test results will be ready. Make sure you get your test results.   You may resume normal activities.  Document Released: 03/30/2000 Document Revised: 03/22/2011 Document Reviewed: 01/21/2011 [employer] Patient Information 2012 [employer]. 

## 2011-11-08 NOTE — Progress Notes (Signed)
Subjective:    Patient ID: Christine Vega, female    DOB: 08/03/1970, 41 y.o.   MRN: [id number]  HPI Patient's last menstrual period was 10/26/2011. [id number] Patient comes for followup after laparoscopic left salpingo-oophorectomy for a dermoid cyst. She has regular mammograms. Last Pap test was April 2011 and was normal and she's never had any abnormal Pap test. She sees a cardiologist and she has a primary care physician. Past Medical History  Diagnosis Date  . Depression   . Endometriosis   . Hyperlipidemia   . Heart murmur     no problems  . Anxiety   . History of seasonal allergies   . Anemia     history   . Seizures 2004    x 1; unknown source  . Back pain     rotates to left hip and tailbone  . 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 Migraines     tx w/depakote/verapamil per pt  . Post traumatic stress disorder (PTSD)   . Fractured coccyx    Past Surgical History  Procedure Date  . Cholecystectomy   . Hernia repair   . Tubal ligation   . Svd     x 2  . Wisdom tooth extraction   . Laparotomy     fro endometriosis/adhesions  . Laparoscopy 05/16/2011    Procedure: LAPAROSCOPY OPERATIVE;  Surgeon: Scheryl Darter, MD;  Location: [facility];  Service: Gynecology;  Laterality: N/A;  poss oophorectomy  . Laparoscopy 08/06/2011    Procedure: LAPAROSCOPY OPERATIVE;  Surgeon: Adam Phenix, MD;  Location: [facility];  Service: Gynecology;  Laterality: N/A;  . Salpingoophorectomy 08/06/2011    Procedure: SALPINGO OOPHERECTOMY;  Surgeon: Adam Phenix, MD;  Location: [facility];  Service: Gynecology;  Laterality: Right;   Current Outpatient Prescriptions on File Prior to Visit  Medication Sig Dispense Refill  . buPROPion (WELLBUTRIN XL) 300 MG 24 hr tablet Take 300 mg by mouth daily.      . Butalbital-APAP-Caffeine (DOLGIC PLUS PO) Take 2 tablets by mouth daily as needed. For migraine      . diclofenac (VOLTAREN) 75 MG EC tablet Take 75 mg by mouth 2  (two) times daily.      . divalproex (DEPAKOTE) 500 MG 24 hr tablet Take 500 mg by mouth 2 (two) times daily.        . fluticasone (FLONASE) 50 MCG/ACT nasal spray Place 2 sprays into the nose daily. Each nostril      . loratadine (CLARITIN) 10 MG tablet Take 10 mg by mouth daily.      Marland Kitchen LORazepam (ATIVAN) 1 MG tablet Take 1.5 mg by mouth 4 (four) times daily. For anxiety.      . sertraline (ZOLOFT) 100 MG tablet Take 150 mg by mouth daily.      Marland Kitchen tiZANidine (ZANAFLEX) 4 MG tablet Take 4 mg by mouth 2 (two) times daily.      . verapamil (CALAN-SR) 120 MG CR tablet Take 120 mg by mouth at bedtime.      . Vitamin D, Ergocalciferol, (DRISDOL) 50000 UNITS CAPS Take 50,000 Units by mouth every 7 (seven) days. Takes on Monday      . vitamin E 100 UNIT capsule Take 100 Units by mouth daily.       Allergies  Allergen Reactions  . Zithromax (Azithromycin) Itching   Reflux medication recently prescribed   Review of Systems  Cardiovascular: Negative for chest pain.  Gastrointestinal:  Positive for nausea (heartburn).  Genitourinary: Negative for vaginal bleeding and pelvic pain.       Objective:   Physical Exam Filed Vitals:   11/08/11 1614  BP: 115/76  Pulse: 93  Temp: 98.3 F (36.8 C)   No acute distress Abdomen: Incisions are well-healed, soft nontender no masses. Pelvic exam was deferred.       Assessment & Plan:  Patient is doing well postoperatively from her laparoscopic surgery. Her recommend she follow up with her primary care physician and consider referral to gastroenterologist do to her reflux and nausea symptoms. She has had peptic ulcers in the past. She'll return next brain for her yearly exam Pap test. She could have yearly mammograms.   Dr. Scheryl Darter 11/08/2011 4:55 PM

## 2012-02-15 ENCOUNTER — Emergency Department (HOSPITAL_COMMUNITY)
Admission: EM | Admit: 2012-02-15 | Discharge: 2012-02-15 | Disposition: A | Discharge status: Home / Self Care | Payer: Medicaid Other | Attending: Emergency Medicine | Admitting: Emergency Medicine

## 2012-02-15 ENCOUNTER — Encounter (HOSPITAL_COMMUNITY): Payer: Self-pay | Admitting: *Deleted

## 2012-02-15 DIAGNOSIS — Z791 Long term (current) use of non-steroidal anti-inflammatories (NSAID): Secondary | ICD-10-CM | POA: Insufficient documentation

## 2012-02-15 DIAGNOSIS — Z862 Personal history of diseases of the blood and blood-forming organs and certain disorders involving the immune mechanism: Secondary | ICD-10-CM | POA: Insufficient documentation

## 2012-02-15 DIAGNOSIS — N809 Endometriosis, unspecified: Secondary | ICD-10-CM | POA: Insufficient documentation

## 2012-02-15 DIAGNOSIS — F411 Generalized anxiety disorder: Secondary | ICD-10-CM | POA: Insufficient documentation

## 2012-02-15 DIAGNOSIS — E785 Hyperlipidemia, unspecified: Secondary | ICD-10-CM | POA: Insufficient documentation

## 2012-02-15 DIAGNOSIS — F4310 Post-traumatic stress disorder, unspecified: Secondary | ICD-10-CM | POA: Insufficient documentation

## 2012-02-15 DIAGNOSIS — Y9289 Other specified places as the place of occurrence of the external cause: Secondary | ICD-10-CM | POA: Insufficient documentation

## 2012-02-15 DIAGNOSIS — G709 Myoneural disorder, unspecified: Secondary | ICD-10-CM | POA: Insufficient documentation

## 2012-02-15 DIAGNOSIS — F329 Major depressive disorder, single episode, unspecified: Secondary | ICD-10-CM | POA: Insufficient documentation

## 2012-02-15 DIAGNOSIS — X19XXXA Contact with other heat and hot substances, initial encounter: Secondary | ICD-10-CM | POA: Insufficient documentation

## 2012-02-15 DIAGNOSIS — F3289 Other specified depressive episodes: Secondary | ICD-10-CM | POA: Insufficient documentation

## 2012-02-15 DIAGNOSIS — Y939 Activity, unspecified: Secondary | ICD-10-CM | POA: Insufficient documentation

## 2012-02-15 DIAGNOSIS — T22199A Burn of first degree of multiple sites of unspecified shoulder and upper limb, except wrist and hand, initial encounter: Secondary | ICD-10-CM | POA: Insufficient documentation

## 2012-02-15 DIAGNOSIS — Z8781 Personal history of (healed) traumatic fracture: Secondary | ICD-10-CM | POA: Insufficient documentation
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
## 2012-02-17 NOTE — ED Provider Notes (Signed)
Medical screening examination/treatment/procedure(s) were performed by non-physician practitioner and as supervising physician I was immediately available for consultation/collaboration.  Hurman Horn, MD 02/17/12 [phone]

## 2012-02-26 ENCOUNTER — Other Ambulatory Visit: Payer: Self-pay | Admitting: Internal Medicine

## 2012-02-26 DIAGNOSIS — Z1231 Encounter for screening mammogram for malignant neoplasm of breast: Secondary | ICD-10-CM

## 2012-03-12 ENCOUNTER — Ambulatory Visit: Payer: Medicaid Other

## 2012-03-18 ENCOUNTER — Ambulatory Visit: Payer: Medicaid Other

## 2012-03-21 ENCOUNTER — Ambulatory Visit
Admission: RE | Admit: 2012-03-21 | Discharge: 2012-03-21 | Disposition: A | Discharge status: Home / Self Care | Payer: Medicaid Other | Source: Ambulatory Visit | Attending: Internal Medicine | Admitting: Internal Medicine

## 2012-03-21 DIAGNOSIS — Z1231 Encounter for screening mammogram for malignant neoplasm of breast: Secondary | ICD-10-CM

## 2012-06-03 ENCOUNTER — Other Ambulatory Visit: Payer: Self-pay | Admitting: Orthopedic Surgery

## 2012-06-24 ENCOUNTER — Ambulatory Visit (HOSPITAL_COMMUNITY)
Admission: RE | Admit: 2012-06-24 | Payer: No Typology Code available for payment source | Source: Ambulatory Visit | Admitting: Orthopedic Surgery

## 2012-06-24 ENCOUNTER — Encounter (HOSPITAL_COMMUNITY): Admission: RE | Payer: Self-pay | Source: Ambulatory Visit

## 2012-06-24 SURGERY — CARPAL TUNNEL RELEASE
Anesthesia: General | Laterality: Right

## 2013-03-06 ENCOUNTER — Other Ambulatory Visit: Payer: Self-pay

## 2013-03-06 DIAGNOSIS — Z1231 Encounter for screening mammogram for malignant neoplasm of breast: Secondary | ICD-10-CM

## 2013-04-07 ENCOUNTER — Ambulatory Visit: Payer: Medicaid Other

## 2013-04-14 ENCOUNTER — Ambulatory Visit
Admission: RE | Admit: 2013-04-14 | Discharge: 2013-04-14 | Disposition: A | Discharge status: Home / Self Care | Payer: Medicaid Other | Source: Ambulatory Visit

## 2013-04-14 DIAGNOSIS — Z1231 Encounter for screening mammogram for malignant neoplasm of breast: Secondary | ICD-10-CM

## 2013-06-24 DIAGNOSIS — G43009 Migraine without aura, not intractable, without status migrainosus: Secondary | ICD-10-CM | POA: Insufficient documentation

## 2013-06-24 DIAGNOSIS — G43711 Chronic migraine without aura, intractable, with status migrainosus: Secondary | ICD-10-CM | POA: Insufficient documentation

## 2013-10-01 ENCOUNTER — Ambulatory Visit: Payer: Medicaid Other | Admitting: Obstetrics & Gynecology

## 2013-10-31 ENCOUNTER — Encounter (HOSPITAL_COMMUNITY): Payer: Self-pay | Admitting: Emergency Medicine

## 2013-10-31 ENCOUNTER — Emergency Department (INDEPENDENT_AMBULATORY_CARE_PROVIDER_SITE_OTHER): Payer: Medicaid Other

## 2013-10-31 ENCOUNTER — Emergency Department (INDEPENDENT_AMBULATORY_CARE_PROVIDER_SITE_OTHER)
Admission: EM | Admit: 2013-10-31 | Discharge: 2013-10-31 | Disposition: A | Discharge status: Home / Self Care | Payer: Medicaid Other | Source: Home / Self Care | Attending: Family Medicine | Admitting: Family Medicine

## 2013-10-31 DIAGNOSIS — J40 Bronchitis, not specified as acute or chronic: Secondary | ICD-10-CM

## 2013-10-31 MED ORDER — PREDNISONE 10 MG PO TABS: 30.0000 mg | ORAL_TABLET | Freq: Every day | ORAL | Status: DC

## 2013-10-31 MED ORDER — IPRATROPIUM BROMIDE 0.06 % NA SOLN: 2.0000 | Freq: Four times a day (QID) | NASAL | Status: DC

## 2013-10-31 NOTE — Discharge Instructions (Signed)
Thank you for coming in today. Call or go to the emergency room if you get worse, have trouble breathing, have chest pains, or palpitations.    Bronchitis Bronchitis is inflammation of the airways that extend from the windpipe into the lungs (bronchi). The inflammation often causes mucus to develop, which leads to a cough. If the inflammation becomes severe, it may cause shortness of breath. CAUSES  Bronchitis may be caused by:   Viral infections.   Bacteria.   Cigarette smoke.   Allergens, pollutants, and other irritants.  SIGNS AND SYMPTOMS  The most common symptom of bronchitis is a frequent cough that produces mucus. Other symptoms include:  Fever.   Body aches.   Chest congestion.   Chills.   Shortness of breath.   Sore throat.  DIAGNOSIS  Bronchitis is usually diagnosed through a medical history and physical exam. Tests, such as chest X-rays, are sometimes done to rule out other conditions.  TREATMENT  You may need to avoid contact with whatever caused the problem (smoking, for example). Medicines are sometimes needed. These may include:  Antibiotics. These may be prescribed if the condition is caused by bacteria.  Cough suppressants. These may be prescribed for relief of cough symptoms.   Inhaled medicines. These may be prescribed to help open your airways and make it easier for you to breathe.   Steroid medicines. These may be prescribed for those with recurrent (chronic) bronchitis. HOME CARE INSTRUCTIONS  Get plenty of rest.   Drink enough fluids to keep your urine clear or pale yellow (unless you have a medical condition that requires fluid restriction). Increasing fluids may help thin your secretions and will prevent dehydration.   Only take over-the-counter or prescription medicines as directed by your health care provider.  Only take antibiotics as directed. Make sure you finish them even if you start to feel better.  Avoid secondhand  smoke, irritating chemicals, and strong fumes. These will make bronchitis worse. If you are a smoker, quit smoking. Consider using nicotine gum or skin patches to help control withdrawal symptoms. Quitting smoking will help your lungs heal faster.   Put a cool-mist humidifier in your bedroom at night to moisten the air. This may help loosen mucus. Change the water in the humidifier daily. You can also run the hot water in your shower and sit in the bathroom with the door closed for 5-10 minutes.   Follow up with your health care provider as directed.   Wash your hands frequently to avoid catching bronchitis again or spreading an infection to others.  SEEK MEDICAL CARE IF: Your symptoms do not improve after 1 week of treatment.  SEEK IMMEDIATE MEDICAL CARE IF:  Your fever increases.  You have chills.   You have chest pain.   You have worsening shortness of breath.   You have bloody sputum.  You faint.  You have lightheadedness.  You have a severe headache.   You vomit repeatedly. MAKE SURE YOU:   Understand these instructions.  Will watch your condition.  Will get help right away if you are not doing well or get worse. Document Released: 04/02/2005 Document Revised: 01/21/2013 Document Reviewed: 11/25/2012 [facility] Patient Information 2015 [place], [place]. This information is not intended to replace advice given to you by your health care provider. Make sure you discuss any questions you have with your health care provider.

## 2013-10-31 NOTE — ED Notes (Signed)
Pt  Reports  Symptoms  Of  Cough  /  Congested   sorethroat       With  Symptoms     X  6  Days            Body  Aches  Present  As  Well  -

## 2013-10-31 NOTE — ED Provider Notes (Signed)
Christine Vega is a 43 y.o. female who presents to Urgent Care today for cough. Patient is a 6 day history of cough congestion chills or sores headache and nasal discharge. Patient notes a subjective fever but denies any nausea vomiting or diarrhea. She has not tried any medications yet. She additionally notes body aches. She is quite bothered by her symptoms.  Past Medical History  Diagnosis Date  . Depression   . Endometriosis   . Hyperlipidemia   . Heart murmur     no problems  . Anxiety   . History of seasonal allergies   . Anemia     history   . Seizures 2004    x 1; unknown source  . Back pain     rotates to left hip and tailbone  . 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 Migraines     tx w/depakote/verapamil per pt  . Post traumatic stress disorder (PTSD)   . Fractured coccyx    History  Substance Use Topics  . Smoking status: Never Smoker   . Smokeless tobacco: Never Used  . Alcohol Use: No   ROS as above Medications: No current facility-administered medications for this encounter.   Current Outpatient Prescriptions  Medication Sig Dispense Refill  . buPROPion (WELLBUTRIN XL) 300 MG 24 hr tablet Take 300 mg by mouth daily.      . Butalbital-APAP-Caffeine (DOLGIC PLUS PO) Take 2 tablets by mouth daily as needed. For migraine      . diclofenac (VOLTAREN) 75 MG EC tablet Take 75 mg by mouth 2 (two) times daily.      . divalproex (DEPAKOTE) 500 MG 24 hr tablet Take 500 mg by mouth 2 (two) times daily.        . fluticasone (FLONASE) 50 MCG/ACT nasal spray Place 2 sprays into the nose daily. Each nostril      . ipratropium (ATROVENT) 0.06 % nasal spray Place 2 sprays into both nostrils 4 (four) times daily.  15 mL  1  . loratadine (CLARITIN) 10 MG tablet Take 10 mg by mouth daily.      Marland Kitchen LORazepam (ATIVAN) 1 MG tablet Take 1.5 mg by mouth 4 (four) times daily. For anxiety.      Marland Kitchen oxyCODONE (OXY IR/ROXICODONE) 5 MG immediate release  tablet Take 5 mg by mouth every 4 (four) hours as needed. For pain      . predniSONE (DELTASONE) 10 MG tablet Take 3 tablets (30 mg total) by mouth daily.  15 tablet  0  . sertraline (ZOLOFT) 100 MG tablet Take 150 mg by mouth daily.      Marland Kitchen tiZANidine (ZANAFLEX) 4 MG tablet Take 4 mg by mouth 2 (two) times daily.      . verapamil (CALAN-SR) 120 MG CR tablet Take 120 mg by mouth at bedtime.      . Vitamin D, Ergocalciferol, (DRISDOL) 50000 UNITS CAPS Take 50,000 Units by mouth every 7 (seven) days. Takes on Monday      . vitamin E 100 UNIT capsule Take 100 Units by mouth daily.        Exam:  BP 118/77  Pulse 87  Temp(Src) 98.5 F (36.9 C) (Oral)  Resp 20  SpO2 99%  LMP 10/14/2013 Gen: Well NAD HEENT: EOMI,  MMM posterior pharynx with cobblestoning. Tympanic membranes are normal appearing bilaterally. Lungs: Normal work of breathing. Slight crackles right lung. Heart: RRR no MRG Abd: NABS, Soft. Nontender, Nondistended  Exts: Brisk capillary refill, warm and well perfused.   No results found for this or any previous visit (from the past 24 hour(s)). Dg Chest 2 View  10/31/2013   CLINICAL DATA:  Cough for 1 week.  Crackles of right lung base.  EXAM: CHEST  2 VIEW  COMPARISON:  None.  FINDINGS: Right hemidiaphragm elevation. Midline trachea. Normal heart size and mediastinal contours. No pleural effusion or pneumothorax. Minimal exclusion of the left costophrenic angle from the frontal radiograph. Clear lungs.  IMPRESSION: No acute cardiopulmonary disease.   Electronically Signed   By: Abigail Miyamoto M.D.   On: 10/31/2013 17:15    Assessment and Plan: 43 y.o. female with bronchitis. Likely viral source. Plan to treat with prednisone Atrovent nasal spray. Use home oxycodone for cough as needed.  Discussed warning signs or symptoms. Please see discharge instructions. Patient expresses understanding.   This note was created using Systems analyst. Any transcription errors  are unintended.    Gregor Hams, MD 10/31/13 [phone]

## 2013-11-05 ENCOUNTER — Ambulatory Visit: Payer: Medicaid Other | Admitting: Obstetrics & Gynecology

## 2013-12-01 ENCOUNTER — Ambulatory Visit: Payer: Medicaid Other | Admitting: Obstetrics & Gynecology

## 2013-12-08 ENCOUNTER — Ambulatory Visit: Payer: Medicaid Other | Admitting: Obstetrics & Gynecology

## 2013-12-22 ENCOUNTER — Ambulatory Visit: Payer: Medicaid Other | Admitting: Obstetrics & Gynecology

## 2013-12-28 ENCOUNTER — Ambulatory Visit: Payer: Medicaid Other | Admitting: Obstetrics and Gynecology

## 2014-01-18 ENCOUNTER — Ambulatory Visit: Payer: Medicaid Other | Admitting: Family Medicine

## 2014-01-27 ENCOUNTER — Ambulatory Visit: Payer: Medicaid Other | Admitting: Advanced Practice Midwife

## 2014-02-02 ENCOUNTER — Ambulatory Visit: Payer: Medicaid Other | Admitting: Family Medicine

## 2014-02-15 ENCOUNTER — Ambulatory Visit: Payer: Medicaid Other | Admitting: Obstetrics & Gynecology

## 2014-02-15 ENCOUNTER — Encounter (HOSPITAL_COMMUNITY): Payer: Self-pay | Admitting: Emergency Medicine

## 2014-02-23 ENCOUNTER — Ambulatory Visit: Payer: Medicaid Other | Admitting: Obstetrics and Gynecology

## 2014-02-24 ENCOUNTER — Other Ambulatory Visit (HOSPITAL_COMMUNITY)
Admission: RE | Admit: 2014-02-24 | Discharge: 2014-02-24 | Disposition: A | Discharge status: Home / Self Care | Payer: Medicaid Other | Source: Ambulatory Visit | Attending: Obstetrics & Gynecology | Admitting: Obstetrics & Gynecology

## 2014-02-24 ENCOUNTER — Ambulatory Visit (INDEPENDENT_AMBULATORY_CARE_PROVIDER_SITE_OTHER): Payer: Medicaid Other | Admitting: Obstetrics & Gynecology

## 2014-02-24 ENCOUNTER — Encounter: Payer: Self-pay | Admitting: Obstetrics & Gynecology

## 2014-02-24 VITALS — BP 131/90 | HR 118 | Ht 71.5 in | Wt 197.6 lb

## 2014-02-24 DIAGNOSIS — Z1151 Encounter for screening for human papillomavirus (HPV): Secondary | ICD-10-CM | POA: Insufficient documentation

## 2014-02-24 DIAGNOSIS — R87810 Cervical high risk human papillomavirus (HPV) DNA test positive: Secondary | ICD-10-CM | POA: Diagnosis not present

## 2014-02-24 DIAGNOSIS — Z1231 Encounter for screening mammogram for malignant neoplasm of breast: Secondary | ICD-10-CM

## 2014-02-24 DIAGNOSIS — Z01419 Encounter for gynecological examination (general) (routine) without abnormal findings: Secondary | ICD-10-CM | POA: Diagnosis not present

## 2014-02-24 DIAGNOSIS — Z0000 Encounter for general adult medical examination without abnormal findings: Secondary | ICD-10-CM | POA: Diagnosis not present

## 2014-02-24 DIAGNOSIS — Z113 Encounter for screening for infections with a predominantly sexual mode of transmission: Secondary | ICD-10-CM | POA: Insufficient documentation

## 2014-02-24 DIAGNOSIS — N760 Acute vaginitis: Secondary | ICD-10-CM | POA: Insufficient documentation

## 2014-02-24 DIAGNOSIS — Z01411 Encounter for gynecological examination (general) (routine) with abnormal findings: Secondary | ICD-10-CM | POA: Diagnosis not present

## 2014-02-24 DIAGNOSIS — Z124 Encounter for screening for malignant neoplasm of cervix: Secondary | ICD-10-CM
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
## 2014-02-25 LAB — RPR

## 2014-02-25 LAB — HEPATITIS C ANTIBODY: HCV Ab: NEGATIVE

## 2014-02-25 LAB — HEPATITIS B SURFACE ANTIGEN: Hepatitis B Surface Ag: NEGATIVE

## 2014-02-25 LAB — HIV ANTIBODY (ROUTINE TESTING W REFLEX): HIV 1&2 Ab, 4th Generation: NONREACTIVE

## 2014-03-01 DIAGNOSIS — R87810 Cervical high risk human papillomavirus (HPV) DNA test positive: Secondary | ICD-10-CM | POA: Insufficient documentation

## 2014-03-01 LAB — CYTOLOGY - PAP

## 2014-03-01 NOTE — Patient Instructions (Signed)
Return to clinic for any scheduled appointments or for any gynecologic concerns as needed.   

## 2014-03-01 NOTE — Addendum Note (Signed)
Addended by: Verita Schneiders A on: 03/01/2014 12:04 PM   Modules accepted: Orders

## 2014-03-03 ENCOUNTER — Telehealth: Payer: Self-pay | Admitting: *Deleted

## 2014-03-03 NOTE — Telephone Encounter (Signed)
Reassured patient regarding her test results with her pap smear.

## 2014-03-17 ENCOUNTER — Encounter: Payer: Self-pay | Admitting: Obstetrics & Gynecology

## 2014-03-17 ENCOUNTER — Ambulatory Visit (INDEPENDENT_AMBULATORY_CARE_PROVIDER_SITE_OTHER): Payer: Medicaid Other | Admitting: Obstetrics & Gynecology

## 2014-03-17 VITALS — BP 116/76 | HR 100 | Wt 194.0 lb

## 2014-03-17 DIAGNOSIS — L298 Other pruritus: Secondary | ICD-10-CM

## 2014-03-17 DIAGNOSIS — N760 Acute vaginitis: Secondary | ICD-10-CM

## 2014-03-17 DIAGNOSIS — B9689 Other specified bacterial agents as the cause of diseases classified elsewhere: Secondary | ICD-10-CM

## 2014-03-17 DIAGNOSIS — N898 Other specified noninflammatory disorders of vagina: Secondary | ICD-10-CM

## 2014-03-17 DIAGNOSIS — R239 Unspecified skin changes: Secondary | ICD-10-CM

## 2014-03-17 DIAGNOSIS — R238 Other skin changes: Secondary | ICD-10-CM

## 2014-03-17 MED ORDER — FLUCONAZOLE 150 MG PO TABS: 150.0000 mg | ORAL_TABLET | Freq: Once | ORAL | Status: DC

## 2014-03-17 MED ORDER — METRONIDAZOLE 500 MG PO TABS: 500.0000 mg | ORAL_TABLET | Freq: Two times a day (BID) | ORAL | Status: AC

## 2014-03-17 NOTE — Patient Instructions (Signed)
Return to clinic for any scheduled appointments or for any gynecologic concerns as needed.  Bacterial Vaginosis Bacterial vaginosis is a vaginal infection that occurs when the normal balance of bacteria in the vagina is disrupted. It results from an overgrowth of certain bacteria. This is the most common vaginal infection in women of childbearing age. Treatment is important to prevent complications, especially in pregnant women, as it can cause a premature delivery. CAUSES  Bacterial vaginosis is caused by an increase in harmful bacteria that are normally present in smaller amounts in the vagina. Several different kinds of bacteria can cause bacterial vaginosis. However, the reason that the condition develops is not fully understood. RISK FACTORS Certain activities or behaviors can put you at an increased risk of developing bacterial vaginosis, including:  Having a new sex partner or multiple sex partners.  Douching.  Using an intrauterine device (IUD) for contraception. Women do not get bacterial vaginosis from toilet seats, bedding, swimming pools, or contact with objects around them. SIGNS AND SYMPTOMS  Some women with bacterial vaginosis have no signs or symptoms. Common symptoms include:  Grey vaginal discharge.  A fishlike odor with discharge, especially after sexual intercourse.  Itching or burning of the vagina and vulva.  Burning or pain with urination. DIAGNOSIS  Your health care provider will take a medical history and examine the vagina for signs of bacterial vaginosis. A sample of vaginal fluid may be taken. Your health care provider will look at this sample under a microscope to check for bacteria and abnormal cells. A vaginal pH test may also be done.  TREATMENT  Bacterial vaginosis may be treated with antibiotic medicines. These may be given in the form of a pill or a vaginal cream. A second round of antibiotics may be prescribed if the condition comes back after treatment.   HOME CARE INSTRUCTIONS   Only take over-the-counter or prescription medicines as directed by your health care provider.  If antibiotic medicine was prescribed, take it as directed. Make sure you finish it even if you start to feel better.  Do not have sex until treatment is completed.  Tell all sexual partners that you have a vaginal infection. They should see their health care provider and be treated if they have problems, such as a mild rash or itching.  Practice safe sex by using condoms and only having one sex partner. SEEK MEDICAL CARE IF:   Your symptoms are not improving after 3 days of treatment.  You have increased discharge or pain.  You have a fever. MAKE SURE YOU:   Understand these instructions.  Will watch your condition.  Will get help right away if you are not doing well or get worse. FOR MORE INFORMATION  Centers for Disease Control and Prevention, Division of STD Prevention: [employer] American Sexual Health Association (ASHA): www.ashastd.org  Document Released: 04/02/2005 Document Revised: 01/21/2013 Document Reviewed: 11/12/2012 [facility] Patient Information 2015 [place], [place]. This information is not intended to replace advice given to you by your health care provider. Make sure you discuss any questions you have with your health care provider.   Vaginitis Vaginitis is an inflammation of the vagina. It is most often caused by a change in the normal balance of the bacteria and yeast that live in the vagina. This change in balance causes an overgrowth of certain bacteria or yeast, which causes the inflammation. There are different types of vaginitis, but the most common types are:  Bacterial vaginosis.  Yeast infection (candidiasis).  Trichomoniasis vaginitis.  This is a sexually transmitted infection (STI).  Viral vaginitis.  Atropic vaginitis.  Allergic vaginitis. CAUSES  The cause depends on the type of vaginitis. Vaginitis can be caused  by:  Bacteria (bacterial vaginosis).  Yeast (yeast infection).  A parasite (trichomoniasis vaginitis)  A virus (viral vaginitis).  Low hormone levels (atrophic vaginitis). Low hormone levels can occur during pregnancy, breastfeeding, or after menopause.  Irritants, such as bubble baths, scented tampons, and feminine sprays (allergic vaginitis). Other factors can change the normal balance of the yeast and bacteria that live in the vagina. These include:  Antibiotic medicines.  Poor hygiene.  Diaphragms, vaginal sponges, spermicides, birth control pills, and intrauterine devices (IUD).  Sexual intercourse.  Infection.  Uncontrolled diabetes.  A weakened immune system. SYMPTOMS  Symptoms can vary depending on the cause of the vaginitis. Common symptoms include:  Abnormal vaginal discharge.  The discharge is white, gray, or yellow with bacterial vaginosis.  The discharge is thick, white, and cheesy with a yeast infection.  The discharge is frothy and yellow or greenish with trichomoniasis.  A bad vaginal odor.  The odor is fishy with bacterial vaginosis.  Vaginal itching, pain, or swelling.  Painful intercourse.  Pain or burning when urinating. Sometimes, there are no symptoms. TREATMENT  Treatment will vary depending on the type of infection.   Bacterial vaginosis and trichomoniasis are often treated with antibiotic creams or pills.  Yeast infections are often treated with antifungal medicines, such as vaginal creams or suppositories.  Viral vaginitis has no cure, but symptoms can be treated with medicines that relieve discomfort. Your sexual partner should be treated as well.  Atrophic vaginitis may be treated with an estrogen cream, pill, suppository, or vaginal ring. If vaginal dryness occurs, lubricants and moisturizing creams may help. You may be told to avoid scented soaps, sprays, or douches.  Allergic vaginitis treatment involves quitting the use of  the product that is causing the problem. Vaginal creams can be used to treat the symptoms. HOME CARE INSTRUCTIONS   Take all medicines as directed by your caregiver.  Keep your genital area clean and dry. Avoid soap and only rinse the area with water.  Avoid douching. It can remove the healthy bacteria in the vagina.  Do not use tampons or have sexual intercourse until your vaginitis has been treated. Use sanitary pads while you have vaginitis.  Wipe from front to back. This avoids the spread of bacteria from the rectum to the vagina.  Let air reach your genital area.  Wear cotton underwear to decrease moisture buildup.  Avoid wearing underwear while you sleep until your vaginitis is gone.  Avoid tight pants and underwear or nylons without a cotton panel.  Take off wet clothing (especially bathing suits) as soon as possible.  Use mild, non-scented products. Avoid using irritants, such as:  Scented feminine sprays.  Fabric softeners.  Scented detergents.  Scented tampons.  Scented soaps or bubble baths.  Practice safe sex and use condoms. Condoms may prevent the spread of trichomoniasis and viral vaginitis. SEEK MEDICAL CARE IF:   You have abdominal pain.  You have a fever or persistent symptoms for more than 2-3 days.  You have a fever and your symptoms suddenly get worse. Document Released: 01/28/2007 Document Revised: 12/26/2011 Document Reviewed: 09/13/2011 [facility] Patient Information 2015 [place], [place]. This information is not intended to replace advice given to you by your health care provider. Make sure you discuss any questions you have with your health care provider.

## 2014-03-17 NOTE — Progress Notes (Signed)
   CLINIC ENCOUNTER NOTE  History:  43 y.o. [id number] here today for evaluation of vaginal itching for a few weeks. Took antibiotics for strep throat recently.  Of note, patient had a recent pap on 02/24/14 with normal cytology, negative Trich, GC and Chlam but positive HRHPV. Negative HPV 16/18. The plan is to repeat cotesting in one year. When patient was informed of this result, she was very anxious about being HPV positive and has called several times reporting a myriad of symptoms (vulvar itching, oral tingling, diffuse skin spots/tingling sensations, other systemic symptoms) that she attributes to her HPV positive status.   The following portions of the patient's history were reviewed and updated as appropriate: allergies, current medications, past family history, past medical history, past social history, past surgical history and problem list.  Normal mammogram on 04/14/2013.   Review of Systems:  Pertinent items are noted in HPI.  Objective:  Physical Exam BP 116/76 mmHg  Pulse 100  Wt 194 lb (87.998 kg)  LMP 03/08/2014 Gen: NAD Abd: Soft, nontender and nondistended Pelvic: Normal appearing external genitalia; normal appearing vaginal mucosa and cervix.  Yellow, malodorous discharge seen, wet prep obtained.  Small uterus, no other palpable masses, no uterine or adnexal tenderness  Assessment & Plan:  Likely bacterial vaginosis, Metronidazole prescribed.  Also prescribed reflex Diflucan.  Will follow up wet prep and manage accordingly. Reassured about her HPV positive status, emphasized that she was negative for HPV 16/18 which is associated most with cervical neoplasia. Other symptoms are not related to HPV.   Dermatology referral done as per patient request for her skin complaints. Routine preventative health maintenance measures emphasized.   Verita Schneiders, MD, [place] Attending [place] for [employer], [place]

## 2014-03-18 LAB — WET PREP, GENITAL
Clue Cells Wet Prep HPF POC: NONE SEEN
Trich, Wet Prep: NONE SEEN
WBC, Wet Prep HPF POC: NONE SEEN
Yeast Wet Prep HPF POC: NONE SEEN

## 2014-03-22 ENCOUNTER — Encounter (HOSPITAL_COMMUNITY): Payer: Self-pay

## 2014-03-22 ENCOUNTER — Emergency Department (HOSPITAL_COMMUNITY)
Admission: EM | Admit: 2014-03-22 | Discharge: 2014-03-22 | Disposition: A | Discharge status: Home / Self Care | Payer: Medicaid Other | Source: Home / Self Care | Attending: Family Medicine | Admitting: Family Medicine

## 2014-03-22 DIAGNOSIS — J069 Acute upper respiratory infection, unspecified: Secondary | ICD-10-CM

## 2014-03-22 MED ORDER — IPRATROPIUM BROMIDE 0.06 % NA SOLN: 2.0000 | Freq: Four times a day (QID) | NASAL | Status: DC

## 2014-03-22 NOTE — ED Notes (Signed)
C/o ST, URI type symptoms x couple of days, has not taken any medication for this problem

## 2014-03-22 NOTE — ED Provider Notes (Signed)
CSN: [id number]     Arrival date & time 03/22/14  1856 History   First MD Initiated Contact with Patient 03/22/14 1925     Chief Complaint  Patient presents with  . URI   (Consider location/radiation/quality/duration/timing/severity/associated sxs/prior Treatment) Patient is a 43 y.o. female presenting with URI. The history is provided by the patient.  URI Presenting symptoms: congestion, cough and rhinorrhea   Presenting symptoms: no fever   Severity:  Mild Onset quality:  Gradual Duration:  2 days Progression:  Worsening Chronicity:  New Associated symptoms: no wheezing   Risk factors: sick contacts   Risk factors comment:  Daughter also sick.   Past Medical History  Diagnosis Date  . Depression   . Endometriosis   . Hyperlipidemia   . Heart murmur     no problems  . Anxiety   . History of seasonal allergies   . Anemia     history   . Seizures 2004    x 1; unknown source  . Back pain     rotates to left hip and tailbone  . 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 Migraines     tx w/depakote/verapamil per pt  . Post traumatic stress disorder (PTSD)   . Fractured coccyx   . Murmur, heart   . Migraine    Past Surgical History  Procedure Laterality Date  . Cholecystectomy    . Hernia repair    . Tubal ligation    . Svd      x 2  . Wisdom tooth extraction    . Laparotomy      fro endometriosis/adhesions  . Laparoscopy  05/16/2011    Procedure: LAPAROSCOPY OPERATIVE;  Surgeon: Emeterio Reeve, MD;  Location: [facility];  Service: Gynecology;  Laterality: N/A;  poss oophorectomy  . Laparoscopy  08/06/2011    Procedure: LAPAROSCOPY OPERATIVE;  Surgeon: Woodroe Mode, MD;  Location: [facility];  Service: Gynecology;  Laterality: N/A;  . Salpingoophorectomy  08/06/2011    Procedure: SALPINGO OOPHERECTOMY;  Surgeon: Woodroe Mode, MD;  Location: [facility];  Service: Gynecology;  Laterality: Right;   History reviewed. No pertinent family  history. History  Substance Use Topics  . Smoking status: Never Smoker   . Smokeless tobacco: Never Used  . Alcohol Use: No   OB History    Gravida Para Term Preterm AB TAB SAB Ectopic Multiple Living   2 2 2  0 0 0 0 0 0 2     Review of Systems  Constitutional: Negative.  Negative for fever.  HENT: Positive for congestion, postnasal drip and rhinorrhea.   Respiratory: Positive for cough. Negative for shortness of breath and wheezing.   Gastrointestinal: Negative.     Allergies  Zithromax  Home Medications   Prior to Admission medications   Medication Sig Start Date End Date Taking? Authorizing Provider  buPROPion (WELLBUTRIN XL) 300 MG 24 hr tablet Take 300 mg by mouth daily.    Historical Provider, MD  diclofenac (VOLTAREN) 75 MG EC tablet Take 75 mg by mouth 2 (two) times daily.    Historical Provider, MD  divalproex (DEPAKOTE) 500 MG 24 hr tablet Take 500 mg by mouth 2 (two) times daily.      Historical Provider, MD  fluconazole (DIFLUCAN) 150 MG tablet Take 1 tablet (150 mg total) by mouth once. Can take additional dose three days later if symptoms persist 03/17/14   Osborne Oman, MD  fluticasone (  FLONASE) 50 MCG/ACT nasal spray Place 2 sprays into the nose daily. Each nostril    Historical Provider, MD  ipratropium (ATROVENT) 0.06 % nasal spray Place 2 sprays into both nostrils 4 (four) times daily. 03/22/14   Billy Fischer, MD  loratadine (CLARITIN) 10 MG tablet Take 10 mg by mouth daily.    Historical Provider, MD  LORazepam (ATIVAN) 1 MG tablet Take 1.5 mg by mouth 4 (four) times daily. For anxiety.    Historical Provider, MD  metroNIDAZOLE (FLAGYL) 500 MG tablet Take 1 tablet (500 mg total) by mouth 2 (two) times daily. 03/17/14 03/24/14  Osborne Oman, MD  oxyCODONE (OXY IR/ROXICODONE) 5 MG immediate release tablet Take 5 mg by mouth every 4 (four) hours as needed. For pain    Historical Provider, MD  predniSONE (DELTASONE) 10 MG tablet Take 3 tablets (30 mg total) by  mouth daily. 10/31/13   Gregor Hams, MD  sertraline (ZOLOFT) 100 MG tablet Take 150 mg by mouth daily.    Historical Provider, MD  tiZANidine (ZANAFLEX) 4 MG tablet Take 4 mg by mouth 2 (two) times daily.    Historical Provider, MD  verapamil (CALAN-SR) 120 MG CR tablet Take 120 mg by mouth at bedtime.    Historical Provider, MD  Vitamin D, Ergocalciferol, (DRISDOL) 50000 UNITS CAPS Take 50,000 Units by mouth every 7 (seven) days. Takes on Monday    Historical Provider, MD  vitamin E 100 UNIT capsule Take 100 Units by mouth daily.    Historical Provider, MD   BP 146/87 mmHg  Pulse 114  Temp(Src) 98.5 F (36.9 C) (Oral)  Resp 18  SpO2 99%  LMP 03/08/2014 Physical Exam  Constitutional: She is oriented to person, place, and time. She appears well-developed and well-nourished. No distress.  HENT:  Right Ear: External ear normal.  Left Ear: External ear normal.  Nose: Nose normal.  Mouth/Throat: Oropharynx is clear and moist.  Eyes: Pupils are equal, round, and reactive to light.  Neck: Normal range of motion. Neck supple.  Cardiovascular: Normal rate and normal heart sounds.   Pulmonary/Chest: Effort normal and breath sounds normal.  Lymphadenopathy:    She has no cervical adenopathy.  Neurological: She is alert and oriented to person, place, and time.  Skin: Skin is warm and dry.  Nursing note and vitals reviewed.   ED Course  Procedures (including critical care time) Labs Review Labs Reviewed - No data to display  Imaging Review No results found.   MDM   1. URI (upper respiratory infection)       Billy Fischer, MD 03/22/14 2034

## 2014-03-29 ENCOUNTER — Emergency Department (HOSPITAL_COMMUNITY)
Admission: EM | Admit: 2014-03-29 | Discharge: 2014-03-29 | Discharge status: Against Medical Advice | Payer: Medicaid Other | Attending: Emergency Medicine | Admitting: Emergency Medicine

## 2014-03-29 ENCOUNTER — Encounter (HOSPITAL_COMMUNITY): Payer: Self-pay | Admitting: Emergency Medicine

## 2014-03-29 DIAGNOSIS — R011 Cardiac murmur, unspecified: Secondary | ICD-10-CM | POA: Diagnosis not present

## 2014-03-29 DIAGNOSIS — K1379 Other lesions of oral mucosa: Secondary | ICD-10-CM | POA: Insufficient documentation

## 2014-03-29 NOTE — ED Notes (Signed)
Patient told registration and sec first that she was not going to be seen.  Patient left.

## 2014-03-29 NOTE — ED Notes (Signed)
Called patient 2 times without response.  Notified by registration that the patient was in [place] with her daughter.

## 2014-03-29 NOTE — ED Notes (Signed)
Patient with mouth lesions on the roof of mouth and on gums.  Patient doesn't know what they are.  Patient states that her mouth burns and itches.

## 2014-04-06 ENCOUNTER — Encounter (HOSPITAL_COMMUNITY): Payer: Self-pay | Admitting: Cardiology

## 2014-04-06 ENCOUNTER — Emergency Department (HOSPITAL_COMMUNITY)
Admission: EM | Admit: 2014-04-06 | Discharge: 2014-04-06 | Disposition: A | Discharge status: Home / Self Care | Payer: Medicaid Other | Attending: Emergency Medicine | Admitting: Emergency Medicine

## 2014-04-06 DIAGNOSIS — Z8781 Personal history of (healed) traumatic fracture: Secondary | ICD-10-CM | POA: Insufficient documentation

## 2014-04-06 DIAGNOSIS — E785 Hyperlipidemia, unspecified: Secondary | ICD-10-CM | POA: Insufficient documentation

## 2014-04-06 DIAGNOSIS — R011 Cardiac murmur, unspecified: Secondary | ICD-10-CM | POA: Insufficient documentation

## 2014-04-06 DIAGNOSIS — F419 Anxiety disorder, unspecified: Secondary | ICD-10-CM | POA: Insufficient documentation

## 2014-04-06 DIAGNOSIS — Z791 Long term (current) use of non-steroidal anti-inflammatories (NSAID): Secondary | ICD-10-CM | POA: Diagnosis not present

## 2014-04-06 DIAGNOSIS — Z7952 Long term (current) use of systemic steroids: Secondary | ICD-10-CM | POA: Insufficient documentation

## 2014-04-06 DIAGNOSIS — F329 Major depressive disorder, single episode, unspecified: Secondary | ICD-10-CM | POA: Insufficient documentation

## 2014-04-06 DIAGNOSIS — Z79899 Other long term (current) drug therapy: Secondary | ICD-10-CM | POA: Insufficient documentation

## 2014-04-06 DIAGNOSIS — G43909 Migraine, unspecified, not intractable, without status migrainosus: Secondary | ICD-10-CM | POA: Diagnosis not present

## 2014-04-06 DIAGNOSIS — K1370 Unspecified lesions of oral mucosa: Secondary | ICD-10-CM | POA: Diagnosis not present

## 2014-04-06 DIAGNOSIS — Z7951 Long term (current) use of inhaled steroids: Secondary | ICD-10-CM | POA: Insufficient documentation

## 2014-04-06 DIAGNOSIS — R21 Rash and other nonspecific skin eruption: Secondary | ICD-10-CM

## 2014-04-06 DIAGNOSIS — Z862 Personal history of diseases of the blood and blood-forming organs and certain disorders involving the immune mechanism: Secondary | ICD-10-CM | POA: Diagnosis not present

## 2014-04-06 MED ORDER — HYDROCORTISONE 1 % EX CREA: 1.0000 "application " | TOPICAL_CREAM | Freq: Two times a day (BID) | CUTANEOUS | Status: DC

## 2014-04-06 MED ORDER — DIPHENHYDRAMINE HCL 25 MG PO TABS: 25.0000 mg | ORAL_TABLET | Freq: Four times a day (QID) | ORAL | Status: DC

## 2014-04-06 NOTE — Discharge Instructions (Signed)
Call for a follow up appointment with a Family or Primary Care Provider.  Call for a follow-up appointment with a dermatologist. Avoid scratching or itching the rash. Return if Symptoms worsen.   Take medication as prescribed.

## 2014-04-06 NOTE — ED Notes (Signed)
Pt reports a rash that she has had for the past couple of weeks. Reports that she tried to see a dermatologist today but they would not take medicaid.

## 2014-04-06 NOTE — ED Provider Notes (Signed)
CSN: [id number]     Arrival date & time 04/06/14  1707 History  This chart was scribed for non-physician practitioner, Harvie Heck, PA-C, working with Orpah Greek, MD, by Ian Bushman, ED Scribe. This patient was seen in room TR10C/TR10C and the patient's care was started at 7:30 PM.   Chief Complaint  Patient presents with  . Rash     (Consider location/radiation/quality/duration/timing/severity/associated sxs/prior Treatment) HPI Comments: Christine Vega is a 43 y.o. female who presents to the Emergency Department complaining of rashes that started a couple weeks ago on her legs, lower back as well as itching inside her upper lip.  She notes that the spots are getting worse and feels like a burning itchy sensation.  She states that they initially started on her legs. She describes the lesions as "brown lesions" and hyperpigmented lesions which she is not scratching at all.  She notes that no other person in the household has the rash and she has no animals. She states she had a fever and runny nose a couple of days ago. She has not taken any benadryl for her symptoms nor has she applied any cream for relief. She denies any blistering.  She went to a dermatologist today but states they gave her the wrong information so she has not seen a doctor for the rashes.  No treatment prior to arrival.  The history is provided by the patient. No language interpreter was used.    Past Medical History  Diagnosis Date  . Depression   . Endometriosis   . Hyperlipidemia   . Heart murmur     no problems  . Anxiety   . History of seasonal allergies   . Anemia     history   . Seizures 2004    x 1; unknown source  . Back pain     rotates to left hip and tailbone  . 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 Migraines     tx w/depakote/verapamil per pt  . Post traumatic stress disorder (PTSD)   . Fractured coccyx   . Murmur, heart   . Migraine     Past Surgical History  Procedure Laterality Date  . Cholecystectomy    . Hernia repair    . Tubal ligation    . Svd      x 2  . Wisdom tooth extraction    . Laparotomy      fro endometriosis/adhesions  . Laparoscopy  05/16/2011    Procedure: LAPAROSCOPY OPERATIVE;  Surgeon: Emeterio Reeve, MD;  Location: [facility];  Service: Gynecology;  Laterality: N/A;  poss oophorectomy  . Laparoscopy  08/06/2011    Procedure: LAPAROSCOPY OPERATIVE;  Surgeon: Woodroe Mode, MD;  Location: [facility];  Service: Gynecology;  Laterality: N/A;  . Salpingoophorectomy  08/06/2011    Procedure: SALPINGO OOPHERECTOMY;  Surgeon: Woodroe Mode, MD;  Location: [facility];  Service: Gynecology;  Laterality: Right;   History reviewed. No pertinent family history. History  Substance Use Topics  . Smoking status: Never Smoker   . Smokeless tobacco: Never Used  . Alcohol Use: No   OB History    Gravida Para Term Preterm AB TAB SAB Ectopic Multiple Living   2 2 2  0 0 0 0 0 0 2     Review of Systems  Constitutional: Negative for fever.  Skin: Positive for color change and rash.      Allergies  Zithromax  Home  Medications   Prior to Admission medications   Medication Sig Start Date End Date Taking? Authorizing Provider  buPROPion (WELLBUTRIN XL) 300 MG 24 hr tablet Take 300 mg by mouth daily.    Historical Provider, MD  diclofenac (VOLTAREN) 75 MG EC tablet Take 75 mg by mouth 2 (two) times daily.    Historical Provider, MD  divalproex (DEPAKOTE) 500 MG 24 hr tablet Take 500 mg by mouth 2 (two) times daily.      Historical Provider, MD  fluconazole (DIFLUCAN) 150 MG tablet Take 1 tablet (150 mg total) by mouth once. Can take additional dose three days later if symptoms persist 03/17/14   Osborne Oman, MD  fluticasone (FLONASE) 50 MCG/ACT nasal spray Place 2 sprays into the nose daily. Each nostril    Historical Provider, MD  ipratropium (ATROVENT) 0.06 % nasal spray Place 2 sprays into both nostrils 4 (four)  times daily. 03/22/14   Billy Fischer, MD  loratadine (CLARITIN) 10 MG tablet Take 10 mg by mouth daily.    Historical Provider, MD  LORazepam (ATIVAN) 1 MG tablet Take 1.5 mg by mouth 4 (four) times daily. For anxiety.    Historical Provider, MD  oxyCODONE (OXY IR/ROXICODONE) 5 MG immediate release tablet Take 5 mg by mouth every 4 (four) hours as needed. For pain    Historical Provider, MD  predniSONE (DELTASONE) 10 MG tablet Take 3 tablets (30 mg total) by mouth daily. 10/31/13   Gregor Hams, MD  sertraline (ZOLOFT) 100 MG tablet Take 150 mg by mouth daily.    Historical Provider, MD  tiZANidine (ZANAFLEX) 4 MG tablet Take 4 mg by mouth 2 (two) times daily.    Historical Provider, MD  verapamil (CALAN-SR) 120 MG CR tablet Take 120 mg by mouth at bedtime.    Historical Provider, MD  Vitamin D, Ergocalciferol, (DRISDOL) 50000 UNITS CAPS Take 50,000 Units by mouth every 7 (seven) days. Takes on Monday    Historical Provider, MD  vitamin E 100 UNIT capsule Take 100 Units by mouth daily.    Historical Provider, MD   BP 142/89 mmHg  Pulse 90  Temp(Src) 98.6 F (37 C) (Oral)  Resp 18  Wt 190 lb (86.183 kg)  SpO2 99%  LMP 03/08/2014 Physical Exam  Constitutional: She is oriented to person, place, and time. She appears well-developed and well-nourished. No distress.  HENT:  Head: Normocephalic and atraumatic.  Mouth/Throat: Oral lesions present.  Upper left with lesion, tiny small vesicles no erythemic base. No other buccal mucosa lesions seen.  Eyes: Conjunctivae and EOM are normal.  Neck: Neck supple.  Pulmonary/Chest: Effort normal. No respiratory distress.  Musculoskeletal: Normal range of motion.  Neurological: She is alert and oriented to person, place, and time.  Skin: Skin is warm and dry.  Patient points to flat hyperpigmented skin areas and hypo-pigmented skin areas no surrounding erythema.  Psychiatric: She has a normal mood and affect. Her behavior is normal.  Nursing note and  vitals reviewed.   ED Course  Procedures (including critical care time) Labs Review Labs Reviewed - No data to display  Imaging Review No results found.   EKG Interpretation None      MDM   Final diagnoses:  Rash  Oral mucosal lesion   Patient points to multiple skin lesions, consistent with previous scarring, freckles, and other hyperpigmented areas. When asked about a lesion on the patient's back, the patient points out her entire back no obvious lesions identified. Patient also  has oral lesions with small vesicles, slightly abnormal from a herpetic lesion. Questional cold sore. Patient is on medication that interacts with herpes medication, will refer to PCP for further evaluation and treatment. Plan to treat for symptomatic rash, cortisone, Benadryl and dermatology follow-up.   I personally performed the services described in this documentation, which was scribed in my presence. The recorded information has been reviewed and is accurate.   Harvie Heck, PA-C 04/06/14 2013  Orpah Greek, MD 04/06/14 [phone]

## 2014-04-14 ENCOUNTER — Emergency Department (INDEPENDENT_AMBULATORY_CARE_PROVIDER_SITE_OTHER)
Admission: EM | Admit: 2014-04-14 | Discharge: 2014-04-14 | Disposition: A | Discharge status: Home / Self Care | Payer: Medicaid Other | Source: Home / Self Care | Attending: Family Medicine | Admitting: Family Medicine

## 2014-04-14 ENCOUNTER — Encounter (HOSPITAL_COMMUNITY): Payer: Self-pay | Admitting: *Deleted

## 2014-04-14 DIAGNOSIS — R21 Rash and other nonspecific skin eruption: Secondary | ICD-10-CM

## 2014-04-14 LAB — GLUCOSE, CAPILLARY: Glucose-Capillary: 88 mg/dL (ref 70–99)

## 2014-04-14 NOTE — ED Provider Notes (Signed)
CSN: [id number]     Arrival date & time 04/14/14  1651 History   First MD Initiated Contact with Patient 04/14/14 1725     Chief Complaint  Patient presents with  . Rash   (Consider location/radiation/quality/duration/timing/severity/associated sxs/prior Treatment) HPI           43 year old female presents for evaluation of possible rash or itching on her legs. She says her legs have been itching for 2 weeks, she has spots on her legs where she says they are burning from the inside out, and also she has a couple spots on her feet. She has scheduled a appointment with a dermatologist tomorrow morning but she felt like she had to come here this evening to see if there is anything that can be done. No pain, swelling. No systemic symptoms. She was seen for this problem in the emergency department one week ago and was told to use hydrocortisone cream and Benadryl, this has not helped.   Past Medical History  Diagnosis Date  . Depression   . Endometriosis   . Hyperlipidemia   . Heart murmur     no problems  . Anxiety   . History of seasonal allergies   . Anemia     history   . Seizures 2004    x 1; unknown source  . Back pain     rotates to left hip and tailbone  . 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 Migraines     tx w/depakote/verapamil per pt  . Post traumatic stress disorder (PTSD)   . Fractured coccyx   . Murmur, heart   . Migraine    Past Surgical History  Procedure Laterality Date  . Cholecystectomy    . Hernia repair    . Tubal ligation    . Svd      x 2  . Wisdom tooth extraction    . Laparotomy      fro endometriosis/adhesions  . Laparoscopy  05/16/2011    Procedure: LAPAROSCOPY OPERATIVE;  Surgeon: Emeterio Reeve, MD;  Location: [facility];  Service: Gynecology;  Laterality: N/A;  poss oophorectomy  . Laparoscopy  08/06/2011    Procedure: LAPAROSCOPY OPERATIVE;  Surgeon: Woodroe Mode, MD;  Location: [facility];  Service: Gynecology;   Laterality: N/A;  . Salpingoophorectomy  08/06/2011    Procedure: SALPINGO OOPHERECTOMY;  Surgeon: Woodroe Mode, MD;  Location: [facility];  Service: Gynecology;  Laterality: Right;   History reviewed. No pertinent family history. History  Substance Use Topics  . Smoking status: Never Smoker   . Smokeless tobacco: Never Used  . Alcohol Use: No   OB History    Gravida Para Term Preterm AB TAB SAB Ectopic Multiple Living   2 2 2  0 0 0 0 0 0 2     Review of Systems  Skin: Positive for rash (See history of present illness).  All other systems reviewed and are negative.   Allergies  Zithromax  Home Medications   Prior to Admission medications   Medication Sig Start Date End Date Taking? Authorizing Provider  buPROPion (WELLBUTRIN XL) 300 MG 24 hr tablet Take 300 mg by mouth daily.    Historical Provider, MD  diclofenac (VOLTAREN) 75 MG EC tablet Take 75 mg by mouth 2 (two) times daily.    Historical Provider, MD  diphenhydrAMINE (BENADRYL) 25 MG tablet Take 1 tablet (25 mg total) by mouth every 6 (six) hours. 04/06/14  Harvie Heck, PA-C  divalproex (DEPAKOTE) 500 MG 24 hr tablet Take 500 mg by mouth 2 (two) times daily.      Historical Provider, MD  fluconazole (DIFLUCAN) 150 MG tablet Take 1 tablet (150 mg total) by mouth once. Can take additional dose three days later if symptoms persist 03/17/14   Osborne Oman, MD  fluticasone (FLONASE) 50 MCG/ACT nasal spray Place 2 sprays into the nose daily. Each nostril    Historical Provider, MD  hydrocortisone cream 1 % Apply 1 application topically 2 (two) times daily. Do not apply to face 04/06/14   Harvie Heck, PA-C  ipratropium (ATROVENT) 0.06 % nasal spray Place 2 sprays into both nostrils 4 (four) times daily. 03/22/14   Billy Fischer, MD  loratadine (CLARITIN) 10 MG tablet Take 10 mg by mouth daily.    Historical Provider, MD  LORazepam (ATIVAN) 1 MG tablet Take 1.5 mg by mouth 4 (four) times daily. For anxiety.    Historical  Provider, MD  oxyCODONE (OXY IR/ROXICODONE) 5 MG immediate release tablet Take 5 mg by mouth every 4 (four) hours as needed. For pain    Historical Provider, MD  predniSONE (DELTASONE) 10 MG tablet Take 3 tablets (30 mg total) by mouth daily. 10/31/13   Gregor Hams, MD  sertraline (ZOLOFT) 100 MG tablet Take 150 mg by mouth daily.    Historical Provider, MD  tiZANidine (ZANAFLEX) 4 MG tablet Take 4 mg by mouth 2 (two) times daily.    Historical Provider, MD  verapamil (CALAN-SR) 120 MG CR tablet Take 120 mg by mouth at bedtime.    Historical Provider, MD  Vitamin D, Ergocalciferol, (DRISDOL) 50000 UNITS CAPS Take 50,000 Units by mouth every 7 (seven) days. Takes on Monday    Historical Provider, MD  vitamin E 100 UNIT capsule Take 100 Units by mouth daily.    Historical Provider, MD   BP 125/81 mmHg  Pulse 91  Temp(Src) 98.4 F (36.9 C) (Oral)  Resp 18  SpO2 97%  LMP 03/19/2014 (Exact Date) Physical Exam  Constitutional: She is oriented to person, place, and time. Vital signs are normal. She appears well-developed and well-nourished. No distress.  HENT:  Head: Normocephalic and atraumatic.  Pulmonary/Chest: Effort normal. No respiratory distress.  Neurological: She is alert and oriented to person, place, and time. She has normal strength. Coordination normal.  Skin: Skin is warm and dry. Rash (2 small scars on either shin) noted. She is not diaphoretic.  Psychiatric: She has a normal mood and affect. Judgment normal.  Nursing note and vitals reviewed.   ED Course  Procedures (including critical care time) Labs Review Labs Reviewed  GLUCOSE, CAPILLARY    Imaging Review No results found.   MDM   1. Rash and nonspecific skin eruption    Patient is requesting CBG, CBG is 88. No rash noted. No abnormalities on her physical exam. I cannot appreciate the problem she is describing. She should follow-up with dermatology tomorrow as previously scheduled if she deems it  necessary.      Liam Graham, PA-C 04/14/14 [phone]

## 2014-04-14 NOTE — Discharge Instructions (Signed)

## 2014-04-14 NOTE — ED Notes (Signed)
Pt   Reports    A  Rash  On  Her  Leg s   X   sev  Weeks         She  Has  Been taking  Benadryl    For  The  Symptoms        She  Reports  Was  Seen     In    Er   approx  1  Week ago for  Similar  Symptoms         She  Reports  She  Saw  ent  Dr     Earlier  Today     -   She      Is  Sitting  Upright           On  Exam table speaking in  Complete  sentances  And  Appears  In no  Severe  Acute  Distress

## 2014-04-19 ENCOUNTER — Telehealth: Payer: Self-pay | Admitting: *Deleted

## 2014-04-19 DIAGNOSIS — N760 Acute vaginitis: Principal | ICD-10-CM

## 2014-04-19 DIAGNOSIS — B9689 Other specified bacterial agents as the cause of diseases classified elsewhere: Secondary | ICD-10-CM

## 2014-04-19 MED ORDER — METRONIDAZOLE 500 MG PO TABS: 500.0000 mg | ORAL_TABLET | Freq: Two times a day (BID) | ORAL | Status: DC

## 2014-04-19 NOTE — Telephone Encounter (Signed)
Patient called and is having symptoms of bacterial vaginosis.  I have sent in Flagyl to her pharmacy.

## 2014-04-23 ENCOUNTER — Ambulatory Visit (HOSPITAL_COMMUNITY): Payer: Medicaid Other

## 2014-04-30 ENCOUNTER — Ambulatory Visit (HOSPITAL_COMMUNITY): Payer: Medicaid Other

## 2014-05-03 ENCOUNTER — Ambulatory Visit (HOSPITAL_COMMUNITY): Payer: Medicaid Other | Attending: Obstetrics & Gynecology

## 2014-05-06 ENCOUNTER — Ambulatory Visit (HOSPITAL_COMMUNITY): Payer: Medicaid Other

## 2014-05-12 ENCOUNTER — Encounter: Payer: Self-pay | Admitting: Obstetrics and Gynecology

## 2014-05-12 ENCOUNTER — Ambulatory Visit (INDEPENDENT_AMBULATORY_CARE_PROVIDER_SITE_OTHER): Payer: Medicaid Other | Admitting: Obstetrics and Gynecology

## 2014-05-12 VITALS — BP 126/83 | HR 89 | Ht 71.0 in | Wt 194.0 lb

## 2014-05-12 DIAGNOSIS — N760 Acute vaginitis: Secondary | ICD-10-CM

## 2014-05-12 NOTE — Progress Notes (Signed)
Patient ID: Christine Vega, female   DOB: 1970/08/12, 44 y.o.   MRN: [id number] 44 yo G2P2 presenting today for the evaluation of vaginitis. She reports vaginal irritation for the past 2 weeks intermittently. She denies the presence of a vaginal discharge but reports vulva pruritis with an occasional odor. She recently changed soap to aveno body gel. She was previously using [employer].   GENERAL: Well-developed, well-nourished female in no acute distress.  PELVIC: Normal external female genitalia. Vagina is pink and rugated.  Normal discharge. Normal appearing cervix. Uterus is normal in size. No adnexal mass or tenderness. EXTREMITIES: No cyanosis, clubbing, or edema, 2+ distal pulses.  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 Patient is very anxious about these results and very concerned that she is contagious. She limits her interactions with people and attributes any new physical findings such as a sore throat, clogged facial pores, or pruritic skin to an HPV infection. After a lot of education and reassurance, the patient left feeling better

## 2014-05-12 NOTE — Addendum Note (Signed)
Addended by: Lin Landsman C on: 05/12/2014 02:01 PM   Modules accepted: Orders

## 2014-05-13 ENCOUNTER — Ambulatory Visit (HOSPITAL_COMMUNITY): Payer: Medicaid Other

## 2014-05-13 LAB — WET PREP, GENITAL
Trich, Wet Prep: NONE SEEN
Yeast Wet Prep HPF POC: NONE SEEN

## 2014-05-13 LAB — GC/CHLAMYDIA PROBE AMP
CT Probe RNA: NEGATIVE
GC Probe RNA: NEGATIVE

## 2014-05-14 ENCOUNTER — Ambulatory Visit: Payer: Medicaid Other | Admitting: Family Medicine

## 2014-05-14 MED ORDER — METRONIDAZOLE 500 MG PO TABS: 500.0000 mg | ORAL_TABLET | Freq: Two times a day (BID) | ORAL | Status: AC

## 2014-05-14 NOTE — Addendum Note (Signed)
Addended by: Mora Bellman on: 05/14/2014 08:08 AM   Modules accepted: Orders

## 2014-05-16 ENCOUNTER — Emergency Department (HOSPITAL_COMMUNITY): Payer: Medicaid Other

## 2014-05-16 ENCOUNTER — Emergency Department (HOSPITAL_COMMUNITY)
Admission: EM | Admit: 2014-05-16 | Discharge: 2014-05-16 | Disposition: A | Discharge status: Home / Self Care | Payer: Medicaid Other | Attending: Emergency Medicine | Admitting: Emergency Medicine

## 2014-05-16 ENCOUNTER — Encounter (HOSPITAL_COMMUNITY): Payer: Self-pay | Admitting: Emergency Medicine

## 2014-05-16 DIAGNOSIS — Z862 Personal history of diseases of the blood and blood-forming organs and certain disorders involving the immune mechanism: Secondary | ICD-10-CM | POA: Insufficient documentation

## 2014-05-16 DIAGNOSIS — Z79899 Other long term (current) drug therapy: Secondary | ICD-10-CM | POA: Insufficient documentation

## 2014-05-16 DIAGNOSIS — M791 Myalgia, unspecified site: Secondary | ICD-10-CM

## 2014-05-16 DIAGNOSIS — Z880 Allergy status to penicillin: Secondary | ICD-10-CM | POA: Insufficient documentation

## 2014-05-16 DIAGNOSIS — G40909 Epilepsy, unspecified, not intractable, without status epilepticus: Secondary | ICD-10-CM | POA: Diagnosis not present

## 2014-05-16 DIAGNOSIS — Z792 Long term (current) use of antibiotics: Secondary | ICD-10-CM | POA: Insufficient documentation

## 2014-05-16 DIAGNOSIS — Z8639 Personal history of other endocrine, nutritional and metabolic disease: Secondary | ICD-10-CM | POA: Diagnosis not present

## 2014-05-16 DIAGNOSIS — F419 Anxiety disorder, unspecified: Secondary | ICD-10-CM | POA: Diagnosis not present

## 2014-05-16 DIAGNOSIS — G43909 Migraine, unspecified, not intractable, without status migrainosus: Secondary | ICD-10-CM | POA: Insufficient documentation

## 2014-05-16 DIAGNOSIS — R509 Fever, unspecified: Secondary | ICD-10-CM | POA: Diagnosis present

## 2014-05-16 DIAGNOSIS — R5383 Other fatigue: Secondary | ICD-10-CM | POA: Insufficient documentation

## 2014-05-16 DIAGNOSIS — Z8742 Personal history of other diseases of the female genital tract: Secondary | ICD-10-CM | POA: Insufficient documentation

## 2014-05-16 LAB — CBC WITH DIFFERENTIAL/PLATELET
Basophils Absolute: 0 10*3/uL (ref 0.0–0.1)
Basophils Relative: 0 % (ref 0–1)
Eosinophils Absolute: 0.2 10*3/uL (ref 0.0–0.7)
Eosinophils Relative: 3 % (ref 0–5)
HCT: 34.7 % — ABNORMAL LOW (ref 36.0–46.0)
Hemoglobin: 11.1 g/dL — ABNORMAL LOW (ref 12.0–15.0)
Lymphocytes Relative: 27 % (ref 12–46)
Lymphs Abs: 2.1 10*3/uL (ref 0.7–4.0)
MCH: 26 pg (ref 26.0–34.0)
MCHC: 32 g/dL (ref 30.0–36.0)
MCV: 81.3 fL (ref 78.0–100.0)
Monocytes Absolute: 0.6 10*3/uL (ref 0.1–1.0)
Monocytes Relative: 8 % (ref 3–12)
Neutro Abs: 4.8 10*3/uL (ref 1.7–7.7)
Neutrophils Relative %: 62 % (ref 43–77)
Platelets: 336 10*3/uL (ref 150–400)
RBC: 4.27 MIL/uL (ref 3.87–5.11)
RDW: 15.2 % (ref 11.5–15.5)
WBC: 7.7 10*3/uL (ref 4.0–10.5)

## 2014-05-16 LAB — COMPREHENSIVE METABOLIC PANEL
ALT: 13 U/L (ref 0–35)
AST: 16 U/L (ref 0–37)
Albumin: 4.1 g/dL (ref 3.5–5.2)
Alkaline Phosphatase: 52 U/L (ref 39–117)
Anion gap: 7 (ref 5–15)
BUN: 7 mg/dL (ref 6–23)
CO2: 27 mmol/L (ref 19–32)
Calcium: 9.5 mg/dL (ref 8.4–10.5)
Chloride: 103 mmol/L (ref 96–112)
Creatinine, Ser: 0.73 mg/dL (ref 0.50–1.10)
GFR calc Af Amer: >90 mL/min (ref >90)
GFR calc non Af Amer: >90 mL/min (ref >90)
Glucose, Bld: 101 mg/dL — ABNORMAL HIGH (ref 70–99)
Potassium: 4.2 mmol/L (ref 3.5–5.1)
Sodium: 137 mmol/L (ref 135–145)
Total Bilirubin: 0.2 mg/dL — ABNORMAL LOW (ref 0.3–1.2)
Total Protein: 7.4 g/dL (ref 6.0–8.3)

## 2014-05-16 LAB — TSH: TSH: 1.422 u[IU]/mL (ref 0.350–4.500)

## 2014-05-16 NOTE — ED Notes (Signed)
Md at bedside

## 2014-05-16 NOTE — Discharge Instructions (Signed)

## 2014-05-16 NOTE — ED Notes (Signed)
Pt in XRAY 

## 2014-05-16 NOTE — ED Provider Notes (Signed)
CSN: [id number]     Arrival date & time 05/16/14  1742 History   First MD Initiated Contact with Patient 05/16/14 1747     Chief Complaint  Patient presents with  . multiple complaints     HPI Pt has been having trouble since November.  She feels weak and dizzy.  She feels like she has to hold on to things when she walks.    She has had a bad taste in her mouth.  She has experienced some headaches.  She also has had some chronic skin issues where it is darker than usual and she it is itchy.  She is tired of this.  She has seen her doctor and does not have any answers.  She has also had some intermittent pain in her back and her doctor scheduled her for a CXR.  She is concerned that she has not had any blood tests.  She is concerned that her doctor is not evaluating her properly. Past Medical History  Diagnosis Date  . Depression   . Endometriosis   . Hyperlipidemia   . Heart murmur     no problems  . Anxiety   . History of seasonal allergies   . Anemia     history   . Seizures 2004    x 1; unknown source  . Back pain     rotates to left hip and tailbone  . 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 Migraines     tx w/depakote/verapamil per pt  . Post traumatic stress disorder (PTSD)   . Fractured coccyx   . Murmur, heart   . Migraine    Past Surgical History  Procedure Laterality Date  . Cholecystectomy    . Hernia repair    . Tubal ligation    . Svd      x 2  . Wisdom tooth extraction    . Laparotomy      fro endometriosis/adhesions  . Laparoscopy  05/16/2011    Procedure: LAPAROSCOPY OPERATIVE;  Surgeon: Emeterio Reeve, MD;  Location: [facility];  Service: Gynecology;  Laterality: N/A;  poss oophorectomy  . Laparoscopy  08/06/2011    Procedure: LAPAROSCOPY OPERATIVE;  Surgeon: Woodroe Mode, MD;  Location: [facility];  Service: Gynecology;  Laterality: N/A;  . Salpingoophorectomy  08/06/2011    Procedure: SALPINGO OOPHERECTOMY;  Surgeon:  Woodroe Mode, MD;  Location: [facility];  Service: Gynecology;  Laterality: Right;   No family history on file. History  Substance Use Topics  . Smoking status: Never Smoker   . Smokeless tobacco: Never Used  . Alcohol Use: No   OB History    Gravida Para Term Preterm AB TAB SAB Ectopic Multiple Living   2 2 2  0 0 0 0 0 0 2     Review of Systems  Constitutional: Positive for fever.  Respiratory: Negative for shortness of breath.   Cardiovascular: Negative for chest pain.  Gastrointestinal: Negative for abdominal pain.  Endocrine: Negative for polydipsia and polyuria.  Genitourinary: Negative for dysuria.  Musculoskeletal: Negative for neck pain.  All other systems reviewed and are negative.     Allergies  Penicillins; Prednisone; and Zithromax  Home Medications   Prior to Admission medications   Medication Sig Start Date End Date Taking? Authorizing Provider  buPROPion (WELLBUTRIN XL) 300 MG 24 hr tablet Take 300 mg by mouth daily as needed (depression).    Yes Historical Provider, MD  cholecalciferol (VITAMIN D) 1000 UNITS tablet Take 1,000 Units by mouth daily.   Yes Historical Provider, MD  divalproex (DEPAKOTE) 500 MG 24 hr tablet Take 500 mg by mouth 2 (two) times daily.     Yes Historical Provider, MD  fluticasone (FLONASE) 50 MCG/ACT nasal spray Place 2 sprays into the nose daily as needed for allergies (allergies). Each nostril   Yes Historical Provider, MD  hydrocortisone cream 1 % Apply 1 application topically 2 (two) times daily. Do not apply to face Patient taking differently: Apply 1 application topically 2 (two) times daily as needed for itching (itching). Do not apply to face 04/06/14  Yes Harvie Heck, PA-C  ipratropium (ATROVENT) 0.06 % nasal spray Place 2 sprays into both nostrils 4 (four) times daily. Patient taking differently: Place 2 sprays into both nostrils 4 (four) times daily as needed (allergies).  03/22/14  Yes Billy Fischer, MD  loratadine  (CLARITIN) 10 MG tablet Take 10 mg by mouth daily as needed for allergies (allergies).    Yes Historical Provider, MD  oxyCODONE (OXY IR/ROXICODONE) 5 MG immediate release tablet Take 5 mg by mouth every 4 (four) hours as needed. For pain   Yes Historical Provider, MD  predniSONE (DELTASONE) 10 MG tablet Take 3 tablets (30 mg total) by mouth daily. 10/31/13  Yes Gregor Hams, MD  sertraline (ZOLOFT) 100 MG tablet Take 150 mg by mouth daily.   Yes Historical Provider, MD  tiZANidine (ZANAFLEX) 4 MG tablet Take 4 mg by mouth every 6 (six) hours as needed for muscle spasms (muscle spasms).    Yes Historical Provider, MD  verapamil (CALAN-SR) 120 MG CR tablet Take 120 mg by mouth at bedtime.   Yes Historical Provider, MD  vitamin E 100 UNIT capsule Take 100 Units by mouth daily.   Yes Historical Provider, MD  diphenhydrAMINE (BENADRYL) 25 MG tablet Take 1 tablet (25 mg total) by mouth every 6 (six) hours. Patient not taking: Reported on 05/16/2014 04/06/14   Harvie Heck, PA-C  fluconazole (DIFLUCAN) 150 MG tablet Take 1 tablet (150 mg total) by mouth once. Can take additional dose three days later if symptoms persist Patient not taking: Reported on 05/16/2014 03/17/14   Osborne Oman, MD  LORazepam (ATIVAN) 1 MG tablet Take 1.5 mg by mouth every 6 (six) hours as needed for anxiety. For anxiety.    Historical Provider, MD  metroNIDAZOLE (FLAGYL) 500 MG tablet Take 1 tablet (500 mg total) by mouth 2 (two) times daily. Patient not taking: Reported on 05/16/2014 04/19/14   Emily Filbert, MD  metroNIDAZOLE (FLAGYL) 500 MG tablet Take 1 tablet (500 mg total) by mouth 2 (two) times daily. 05/14/14 05/21/14  Peggy Constant, MD   BP 98/75 mmHg  Pulse 84  Temp(Src) 98 F (36.7 C) (Oral)  Resp 18  SpO2 100%  LMP 04/18/2014 Physical Exam  Constitutional: She appears well-developed and well-nourished. No distress.  HENT:  Head: Normocephalic and atraumatic.  Right Ear: External ear normal.  Left Ear: External  ear normal.  Eyes: Conjunctivae are normal. Right eye exhibits no discharge. Left eye exhibits no discharge. No scleral icterus.  Neck: Neck supple. No tracheal deviation present.  Cardiovascular: Normal rate, regular rhythm and intact distal pulses.   Pulmonary/Chest: Effort normal and breath sounds normal. No stridor. No respiratory distress. She has no wheezes. She has no rales.  Abdominal: Soft. Bowel sounds are normal. She exhibits no distension. There is no tenderness. There is no rebound and no  guarding.  Musculoskeletal: She exhibits no edema or tenderness.  Neurological: She is alert. She has normal strength. No cranial nerve deficit (no facial droop, extraocular movements intact, no slurred speech) or sensory deficit. She exhibits normal muscle tone. She displays no seizure activity. Coordination normal.  Skin: Skin is warm and dry. No rash noted.  Psychiatric: She has a normal mood and affect.  Nursing note and vitals reviewed.   ED Course  Procedures (including critical care time) Labs Review Labs Reviewed  CBC WITH DIFFERENTIAL/PLATELET - Abnormal; Notable for the following:    Hemoglobin 11.1 (*)    HCT 34.7 (*)    All other components within normal limits  COMPREHENSIVE METABOLIC PANEL - Abnormal; Notable for the following:    Glucose, Bld 101 (*)    Total Bilirubin 0.2 (*)    All other components within normal limits  TSH  T4, FREE    Imaging Review Dg Chest 2 View  05/16/2014   CLINICAL DATA:  Acute onset of chest pain. Chronic weakness, nausea and skin discoloration. Initial encounter.  EXAM: CHEST  2 VIEW  COMPARISON:  Chest radiograph performed 10/31/2013  FINDINGS: The lungs are well-aerated and clear. There is no evidence of focal opacification, pleural effusion or pneumothorax.  The heart is normal in size; the mediastinal contour is within normal limits. No acute osseous abnormalities are seen. Clips are noted within the right upper quadrant, reflecting prior  cholecystectomy.  IMPRESSION: No acute cardiopulmonary process seen.   Electronically Signed   By: Garald Balding M.D.   On: 05/16/2014 18:41     MDM   Final diagnoses:  Other fatigue  Myalgia    I don't have a clear etiology for the patient's symptoms. Laboratory tests are unremarkable. Thyroid panel was sent off those results are not available. Recommended patient follow up with her primary doctor or if she is unhappy consider seeing another doctor for a second opinion and to follow up on the thyroid results.    Dorie Rank, MD 05/16/14 2046

## 2014-05-16 NOTE — ED Notes (Signed)
Bed: WA09 Expected date: 05/16/14 Expected time: 5:45 PM Means of arrival: Ambulance Comments: EMS

## 2014-05-16 NOTE — ED Notes (Signed)
Pt from home via [facility] c/o multiple complaints (ongoing rash, weakness x 3 months, nausea, and bad taste in her mouth). Reports being seen at her PCP and dermatology repeatedly.

## 2014-05-17 ENCOUNTER — Telehealth: Payer: Self-pay | Admitting: *Deleted

## 2014-05-17 LAB — T4, FREE: Free T4: 0.82 ng/dL (ref 0.80–1.80)

## 2014-05-17 NOTE — Telephone Encounter (Signed)
-----   Message from Mora Bellman, MD sent at 05/14/2014  8:05 AM EST ----- Please inform patient of positive BV. Flagyl has been e-prescribed. Please inform the patient that this is a common infection and is not sexually transmitted and has nothing to do with HPV  Thanks  Vickii Chafe

## 2014-05-17 NOTE — Telephone Encounter (Signed)
Pt aware.

## 2014-05-18 ENCOUNTER — Telehealth (HOSPITAL_BASED_OUTPATIENT_CLINIC_OR_DEPARTMENT_OTHER): Payer: Self-pay | Admitting: Emergency Medicine

## 2014-05-18 ENCOUNTER — Ambulatory Visit (HOSPITAL_COMMUNITY): Payer: Medicaid Other

## 2014-05-21 ENCOUNTER — Ambulatory Visit (HOSPITAL_COMMUNITY): Payer: Medicaid Other

## 2014-05-25 ENCOUNTER — Ambulatory Visit (HOSPITAL_COMMUNITY): Payer: Medicaid Other

## 2014-05-28 ENCOUNTER — Other Ambulatory Visit: Payer: Self-pay | Admitting: Obstetrics & Gynecology

## 2014-06-02 ENCOUNTER — Other Ambulatory Visit (HOSPITAL_COMMUNITY): Payer: Self-pay | Admitting: Internal Medicine

## 2014-06-02 DIAGNOSIS — Z1231 Encounter for screening mammogram for malignant neoplasm of breast: Secondary | ICD-10-CM

## 2014-06-08 ENCOUNTER — Ambulatory Visit (HOSPITAL_COMMUNITY): Payer: Medicaid Other

## 2014-06-09 ENCOUNTER — Emergency Department (INDEPENDENT_AMBULATORY_CARE_PROVIDER_SITE_OTHER)
Admission: EM | Admit: 2014-06-09 | Discharge: 2014-06-09 | Disposition: A | Discharge status: Home / Self Care | Payer: Medicaid Other | Source: Home / Self Care | Attending: Emergency Medicine | Admitting: Emergency Medicine

## 2014-06-09 ENCOUNTER — Encounter (HOSPITAL_COMMUNITY): Payer: Self-pay | Admitting: Emergency Medicine

## 2014-06-09 DIAGNOSIS — J069 Acute upper respiratory infection, unspecified: Secondary | ICD-10-CM

## 2014-06-09 DIAGNOSIS — J310 Chronic rhinitis: Secondary | ICD-10-CM

## 2014-06-09 DIAGNOSIS — J329 Chronic sinusitis, unspecified: Secondary | ICD-10-CM

## 2014-06-09 NOTE — ED Notes (Signed)
Pt states that she has had a productive cough and sinus pain for 2 weeks.

## 2014-06-09 NOTE — Discharge Instructions (Signed)
Upper Respiratory Infection, Adult Nyquil or Theraflu for symptoms, may cause drowsiness An upper respiratory infection (URI) is also sometimes known as the common cold. The upper respiratory tract includes the nose, sinuses, throat, trachea, and bronchi. Bronchi are the airways leading to the lungs. Most people improve within 1 week, but symptoms can last up to 2 weeks. A residual cough may last even longer.  CAUSES Many different viruses can infect the tissues lining the upper respiratory tract. The tissues become irritated and inflamed and often become very moist. Mucus production is also common. A cold is contagious. You can easily spread the virus to others by oral contact. This includes kissing, sharing a glass, coughing, or sneezing. Touching your mouth or nose and then touching a surface, which is then touched by another person, can also spread the virus. SYMPTOMS  Symptoms typically develop 1 to 3 days after you come in contact with a cold virus. Symptoms vary from person to person. They may include:  Runny nose.  Sneezing.  Nasal congestion.  Sinus irritation.  Sore throat.  Loss of voice (laryngitis).  Cough.  Fatigue.  Muscle aches.  Loss of appetite.  Headache.  Low-grade fever. DIAGNOSIS  You might diagnose your own cold based on familiar symptoms, since most people get a cold 2 to 3 times a year. Your caregiver can confirm this based on your exam. Most importantly, your caregiver can check that your symptoms are not due to another disease such as strep throat, sinusitis, pneumonia, asthma, or epiglottitis. Blood tests, throat tests, and X-rays are not necessary to diagnose a common cold, but they may sometimes be helpful in excluding other more serious diseases. Your caregiver will decide if any further tests are required. RISKS AND COMPLICATIONS  You may be at risk for a more severe case of the common cold if you smoke cigarettes, have chronic heart disease (such as  heart failure) or lung disease (such as asthma), or if you have a weakened immune system. The very [place] and very old are also at risk for more serious infections. Bacterial sinusitis, middle ear infections, and bacterial pneumonia can complicate the common cold. The common cold can worsen asthma and chronic obstructive pulmonary disease (COPD). Sometimes, these complications can require emergency medical care and may be life-threatening. PREVENTION  The best way to protect against getting a cold is to practice good hygiene. Avoid oral or hand contact with people with cold symptoms. Wash your hands often if contact occurs. There is no clear evidence that vitamin C, vitamin E, echinacea, or exercise reduces the chance of developing a cold. However, it is always recommended to get plenty of rest and practice good nutrition. TREATMENT  Treatment is directed at relieving symptoms. There is no cure. Antibiotics are not effective, because the infection is caused by a virus, not by bacteria. Treatment may include:  Increased fluid intake. Sports drinks offer valuable electrolytes, sugars, and fluids.  Breathing heated mist or steam (vaporizer or shower).  Eating chicken soup or other clear broths, and maintaining good nutrition.  Getting plenty of rest.  Using gargles or lozenges for comfort.  Controlling fevers with ibuprofen or acetaminophen as directed by your caregiver.  Increasing usage of your inhaler if you have asthma. Zinc gel and zinc lozenges, taken in the first 24 hours of the common cold, can shorten the duration and lessen the severity of symptoms. Pain medicines may help with fever, muscle aches, and throat pain. A variety of non-prescription medicines are available  to treat congestion and runny nose. Your caregiver can make recommendations and may suggest nasal or lung inhalers for other symptoms.  HOME CARE INSTRUCTIONS   Only take over-the-counter or prescription medicines for pain,  discomfort, or fever as directed by your caregiver.  Use a warm mist humidifier or inhale steam from a shower to increase air moisture. This may keep secretions moist and make it easier to breathe.  Drink enough water and fluids to keep your urine clear or pale yellow.  Rest as needed.  Return to work when your temperature has returned to normal or as your caregiver advises. You may need to stay home longer to avoid infecting others. You can also use a face mask and careful hand washing to prevent spread of the virus. SEEK MEDICAL CARE IF:   After the first few days, you feel you are getting worse rather than better.  You need your caregiver's advice about medicines to control symptoms.  You develop chills, worsening shortness of breath, or brown or red sputum. These may be signs of pneumonia.  You develop yellow or brown nasal discharge or pain in the face, especially when you bend forward. These may be signs of sinusitis.  You develop a fever, swollen neck glands, pain with swallowing, or white areas in the back of your throat. These may be signs of strep throat. SEEK IMMEDIATE MEDICAL CARE IF:   You have a fever.  You develop severe or persistent headache, ear pain, sinus pain, or chest pain.  You develop wheezing, a prolonged cough, cough up blood, or have a change in your usual mucus (if you have chronic lung disease).  You develop sore muscles or a stiff neck. Document Released: 09/26/2000 Document Revised: 06/25/2011 Document Reviewed: 07/08/2013 [facility] Patient Information 2015 [place], [place]. This information is not intended to replace advice given to you by your health care provider. Make sure you discuss any questions you have with your health care provider.

## 2014-06-09 NOTE — ED Provider Notes (Signed)
CSN: [id number]     Arrival date & time 06/09/14  1433 History   First MD Initiated Contact with Patient 06/09/14 1641     Chief Complaint  Patient presents with  . Cough   (Consider location/radiation/quality/duration/timing/severity/associated sxs/prior Treatment) HPI Comments: 44 year old female complaining of head and chest congestion, cough, PND that is worse at night was trying to sleep. She has had no sore throat, chest pain, earache or shortness of breath. She states no medications for symptoms.  Patient is a 44 y.o. female presenting with cough.  Cough Associated symptoms: rhinorrhea   Associated symptoms: no chills, no fever, no rash, no shortness of breath and no sore throat     Past Medical History  Diagnosis Date  . Depression   . Endometriosis   . Hyperlipidemia   . Heart murmur     no problems  . Anxiety   . History of seasonal allergies   . Anemia     history   . Seizures 2004    x 1; unknown source  . Back pain     rotates to left hip and tailbone  . 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 Migraines     tx w/depakote/verapamil per pt  . Post traumatic stress disorder (PTSD)   . Fractured coccyx   . Murmur, heart   . Migraine    Past Surgical History  Procedure Laterality Date  . Cholecystectomy    . Hernia repair    . Tubal ligation    . Svd      x 2  . Wisdom tooth extraction    . Laparotomy      fro endometriosis/adhesions  . Laparoscopy  05/16/2011    Procedure: LAPAROSCOPY OPERATIVE;  Surgeon: Emeterio Reeve, MD;  Location: [facility];  Service: Gynecology;  Laterality: N/A;  poss oophorectomy  . Laparoscopy  08/06/2011    Procedure: LAPAROSCOPY OPERATIVE;  Surgeon: Woodroe Mode, MD;  Location: [facility];  Service: Gynecology;  Laterality: N/A;  . Salpingoophorectomy  08/06/2011    Procedure: SALPINGO OOPHERECTOMY;  Surgeon: Woodroe Mode, MD;  Location: [facility];  Service: Gynecology;  Laterality: Right;   History  reviewed. No pertinent family history. History  Substance Use Topics  . Smoking status: Never Smoker   . Smokeless tobacco: Never Used  . Alcohol Use: No   OB History    Gravida Para Term Preterm AB TAB SAB Ectopic Multiple Living   2 2 2  0 0 0 0 0 0 2     Review of Systems  Constitutional: Positive for activity change. Negative for fever, chills, appetite change and fatigue.  HENT: Positive for congestion, postnasal drip and rhinorrhea. Negative for facial swelling and sore throat.   Eyes: Negative.   Respiratory: Positive for cough. Negative for shortness of breath.   Cardiovascular: Negative.   Gastrointestinal: Negative.   Genitourinary: Negative.   Musculoskeletal: Negative for neck pain and neck stiffness.  Skin: Negative for pallor and rash.  Neurological: Negative.     Allergies  Penicillins; Prednisone; and Zithromax  Home Medications   Prior to Admission medications   Medication Sig Start Date End Date Taking? Authorizing Provider  buPROPion (WELLBUTRIN XL) 300 MG 24 hr tablet Take 300 mg by mouth daily as needed (depression).     Historical Provider, MD  cholecalciferol (VITAMIN D) 1000 UNITS tablet Take 1,000 Units by mouth daily.    Historical Provider, MD  diphenhydrAMINE (BENADRYL) 25  MG tablet Take 1 tablet (25 mg total) by mouth every 6 (six) hours. Patient not taking: Reported on 05/16/2014 04/06/14   Harvie Heck, PA-C  divalproex (DEPAKOTE) 500 MG 24 hr tablet Take 500 mg by mouth 2 (two) times daily.      Historical Provider, MD  fluconazole (DIFLUCAN) 150 MG tablet TAKE 1 TABLET BY MOUTH ONCE; MAY TAKE ADDITIONAL DOSE 3 DAYS LATER IF SYMPTOMS PERSIST 06/01/14   Osborne Oman, MD  fluticasone (FLONASE) 50 MCG/ACT nasal spray Place 2 sprays into the nose daily as needed for allergies (allergies). Each nostril    Historical Provider, MD  hydrocortisone cream 1 % Apply 1 application topically 2 (two) times daily. Do not apply to face Patient taking  differently: Apply 1 application topically 2 (two) times daily as needed for itching (itching). Do not apply to face 04/06/14   Harvie Heck, PA-C  ipratropium (ATROVENT) 0.06 % nasal spray Place 2 sprays into both nostrils 4 (four) times daily. Patient taking differently: Place 2 sprays into both nostrils 4 (four) times daily as needed (allergies).  03/22/14   Billy Fischer, MD  loratadine (CLARITIN) 10 MG tablet Take 10 mg by mouth daily as needed for allergies (allergies).     Historical Provider, MD  LORazepam (ATIVAN) 1 MG tablet Take 1.5 mg by mouth every 6 (six) hours as needed for anxiety. For anxiety.    Historical Provider, MD  metroNIDAZOLE (FLAGYL) 500 MG tablet Take 1 tablet (500 mg total) by mouth 2 (two) times daily. Patient not taking: Reported on 05/16/2014 04/19/14   Emily Filbert, MD  oxyCODONE (OXY IR/ROXICODONE) 5 MG immediate release tablet Take 5 mg by mouth every 4 (four) hours as needed. For pain    Historical Provider, MD  predniSONE (DELTASONE) 10 MG tablet Take 3 tablets (30 mg total) by mouth daily. 10/31/13   Gregor Hams, MD  sertraline (ZOLOFT) 100 MG tablet Take 150 mg by mouth daily.    Historical Provider, MD  tiZANidine (ZANAFLEX) 4 MG tablet Take 4 mg by mouth every 6 (six) hours as needed for muscle spasms (muscle spasms).     Historical Provider, MD  verapamil (CALAN-SR) 120 MG CR tablet Take 120 mg by mouth at bedtime.    Historical Provider, MD  vitamin E 100 UNIT capsule Take 100 Units by mouth daily.    Historical Provider, MD   BP 122/69 mmHg  Pulse 89  Temp(Src) 97.8 F (36.6 C) (Oral)  Resp 16  SpO2 100%  LMP 05/17/2014 Physical Exam  Constitutional: She is oriented to person, place, and time. She appears well-developed and well-nourished. No distress.  HENT:  Mouth/Throat: No oropharyngeal exudate.  Bilateral TMs are normal. Oropharynx with minor erythema, cobblestoning and clear PND.  Eyes: Conjunctivae and EOM are normal.  Neck: Normal range of  motion. Neck supple.  Cardiovascular: Normal rate, regular rhythm and normal heart sounds.   Pulmonary/Chest: Effort normal and breath sounds normal. No respiratory distress.  Musculoskeletal: Normal range of motion. She exhibits no edema.  Lymphadenopathy:    She has no cervical adenopathy.  Neurological: She is alert and oriented to person, place, and time.  Skin: Skin is warm and dry. No rash noted.  Psychiatric: She has a normal mood and affect.  Nursing note and vitals reviewed.   ED Course  Procedures (including critical care time) Labs Review Labs Reviewed - No data to display  Imaging Review No results found.   MDM   1.  Rhinosinusitis   2. URI (upper respiratory infection)    Nyquil or Theraflu for symptoms, may cause drowsiness      Janne Napoleon, NP 06/09/14 1659

## 2014-06-11 ENCOUNTER — Ambulatory Visit (HOSPITAL_COMMUNITY)
Admission: RE | Admit: 2014-06-11 | Discharge: 2014-06-11 | Disposition: A | Discharge status: Home / Self Care | Payer: Medicaid Other | Source: Ambulatory Visit | Attending: Internal Medicine | Admitting: Internal Medicine

## 2014-06-11 ENCOUNTER — Ambulatory Visit (HOSPITAL_COMMUNITY): Payer: Medicaid Other

## 2014-06-11 DIAGNOSIS — Z1231 Encounter for screening mammogram for malignant neoplasm of breast: Secondary | ICD-10-CM | POA: Diagnosis present

## 2014-07-05 ENCOUNTER — Ambulatory Visit: Payer: Medicaid Other | Admitting: Family Medicine

## 2014-07-07 ENCOUNTER — Encounter: Payer: Self-pay | Admitting: Obstetrics and Gynecology

## 2014-07-07 ENCOUNTER — Ambulatory Visit (INDEPENDENT_AMBULATORY_CARE_PROVIDER_SITE_OTHER): Payer: Medicaid Other | Admitting: Obstetrics and Gynecology

## 2014-07-07 VITALS — BP 113/72 | HR 101 | Ht 71.5 in | Wt 197.0 lb

## 2014-07-07 DIAGNOSIS — R3915 Urgency of urination: Secondary | ICD-10-CM

## 2014-07-07 DIAGNOSIS — R102 Pelvic and perineal pain: Secondary | ICD-10-CM

## 2014-07-07 DIAGNOSIS — R1032 Left lower quadrant pain: Secondary | ICD-10-CM | POA: Diagnosis not present

## 2014-07-07 DIAGNOSIS — R350 Frequency of micturition: Secondary | ICD-10-CM

## 2014-07-07 DIAGNOSIS — N912 Amenorrhea, unspecified: Secondary | ICD-10-CM

## 2014-07-07 LAB — POCT URINALYSIS DIPSTICK
Bilirubin, UA: NEGATIVE
Blood, UA: NEGATIVE
Glucose, UA: NEGATIVE
Ketones, UA: NEGATIVE
Leukocytes, UA: NEGATIVE
Nitrite, UA: NEGATIVE
Protein, UA: NEGATIVE
Spec Grav, UA: 1.015
Urobilinogen, UA: NEGATIVE
pH, UA: 5

## 2014-07-07 LAB — POCT URINE PREGNANCY: Preg Test, Ur: NEGATIVE

## 2014-07-07 NOTE — Addendum Note (Signed)
Addended by: Erik Obey on: 07/07/2014 01:24 PM   Modules accepted: Orders

## 2014-07-07 NOTE — Progress Notes (Signed)
Patient ID: Christine Vega, female   DOB: [birth year], 44 y.o.   MRN: [id number] 44 yo G2P2 with LMP 05/17/2014 presenting today for evaluation of left lower quadrant discomfort. Patient reports a nagging pain in her left lower quadrant for the past month. She states it feels similar to the discomfort she experienced in 2013 with her dermoid cyst. She has not had a period in March and is concerned. Patient has had a BTL. Patient also reports experiencing urinary frequency and urgency  Past Medical History  Diagnosis Date  . Depression   . Endometriosis   . Hyperlipidemia   . Heart murmur     no problems  . Anxiety   . History of seasonal allergies   . Anemia     history   . Seizures 2004    x 1; unknown source  . Back pain     rotates to left hip and tailbone  . 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 Migraines     tx w/depakote/verapamil per pt  . Post traumatic stress disorder (PTSD)   . Fractured coccyx   . Murmur, heart   . Migraine    Past Surgical History  Procedure Laterality Date  . Cholecystectomy    . Hernia repair    . Tubal ligation    . Svd      x 2  . Wisdom tooth extraction    . Laparotomy      fro endometriosis/adhesions  . Laparoscopy  05/16/2011    Procedure: LAPAROSCOPY OPERATIVE;  Surgeon: Emeterio Reeve, MD;  Location: [facility];  Service: Gynecology;  Laterality: N/A;  poss oophorectomy  . Laparoscopy  08/06/2011    Procedure: LAPAROSCOPY OPERATIVE;  Surgeon: Woodroe Mode, MD;  Location: [facility];  Service: Gynecology;  Laterality: N/A;  . Salpingoophorectomy  08/06/2011    Procedure: SALPINGO OOPHERECTOMY;  Surgeon: Woodroe Mode, MD;  Location: [facility];  Service: Gynecology;  Laterality: Right;   No family history on file. History  Substance Use Topics  . Smoking status: Never Smoker   . Smokeless tobacco: Never Used  . Alcohol Use: No   GENERAL: Well-developed, well-nourished female in no acute distress.  ABDOMEN:  Soft, nontender, nondistended. No organomegaly. PELVIC: Normal external female genitalia. Vagina is pink and rugated.  Normal discharge. Normal appearing cervix. Uterus is normal in size. No adnexal mass or tenderness. EXTREMITIES: No cyanosis, clubbing, or edema, 2+ distal pulses.  UA- neg  A/P 44 yo with LLQ pain - Will check a urine culture - Pelvic ultrasound ordered - patient advised to keep a menstrual calendar - Patient will be contacted with any abnormal results - RTC prn

## 2014-07-08 LAB — CULTURE, URINE COMPREHENSIVE
Colony Count: NO GROWTH
Organism ID, Bacteria: NO GROWTH

## 2014-07-16 ENCOUNTER — Ambulatory Visit (HOSPITAL_COMMUNITY)
Admission: RE | Admit: 2014-07-16 | Discharge: 2014-07-16 | Disposition: A | Discharge status: Home / Self Care | Payer: Medicaid Other | Source: Ambulatory Visit | Attending: Obstetrics and Gynecology | Admitting: Obstetrics and Gynecology

## 2014-07-16 DIAGNOSIS — R102 Pelvic and perineal pain: Secondary | ICD-10-CM

## 2014-07-27 ENCOUNTER — Other Ambulatory Visit: Payer: Self-pay | Admitting: Obstetrics & Gynecology

## 2014-08-09 ENCOUNTER — Ambulatory Visit: Payer: Medicaid Other | Admitting: Obstetrics and Gynecology

## 2014-08-17 ENCOUNTER — Ambulatory Visit: Payer: Medicaid Other | Admitting: Obstetrics & Gynecology

## 2014-09-22 ENCOUNTER — Encounter: Payer: Self-pay | Admitting: Obstetrics & Gynecology

## 2014-09-22 ENCOUNTER — Ambulatory Visit (INDEPENDENT_AMBULATORY_CARE_PROVIDER_SITE_OTHER): Payer: Medicaid Other | Admitting: Obstetrics & Gynecology

## 2014-09-22 ENCOUNTER — Ambulatory Visit: Payer: Medicaid Other | Admitting: Obstetrics & Gynecology

## 2014-09-22 VITALS — BP 115/74 | HR 88 | Ht 71.5 in | Wt 205.0 lb

## 2014-09-22 DIAGNOSIS — A499 Bacterial infection, unspecified: Secondary | ICD-10-CM | POA: Diagnosis not present

## 2014-09-22 DIAGNOSIS — N760 Acute vaginitis: Secondary | ICD-10-CM | POA: Diagnosis not present

## 2014-09-22 DIAGNOSIS — B9689 Other specified bacterial agents as the cause of diseases classified elsewhere: Secondary | ICD-10-CM

## 2014-09-22 NOTE — Patient Instructions (Signed)
Return to clinic for any scheduled appointments or for any gynecologic concerns as needed.   

## 2014-09-22 NOTE — Progress Notes (Signed)
CLINIC ENCOUNTER NOTE  History:  44 y.o. [id number] here today for evaluation of vaginitis. Reports vulvovaginal irritation x 2 weeks, with associated discharge.  No fevers, abdominal pain or irregular bleeding.   Past Medical History  Diagnosis Date  . Depression   . Endometriosis   . Hyperlipidemia   . Heart murmur     no problems  . Anxiety   . History of seasonal allergies   . Anemia     history   . Seizures 2004    x 1; unknown source  . Back pain     rotates to left hip and tailbone  . 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 Migraines     tx w/depakote/verapamil per pt  . Post traumatic stress disorder (PTSD)   . Fractured coccyx   . Murmur, heart   . Migraine     Past Surgical History  Procedure Laterality Date  . Cholecystectomy    . Hernia repair    . Tubal ligation    . Svd      x 2  . Wisdom tooth extraction    . Laparotomy      for endometriosis/adhesions  . Laparoscopy  05/16/2011    Procedure: LAPAROSCOPY OPERATIVE;  Surgeon: Emeterio Reeve, MD;  Location: [facility];  Service: Gynecology;  Laterality: N/A;  poss oophorectomy  . Laparoscopy  08/06/2011    Procedure: LAPAROSCOPY OPERATIVE;  Surgeon: Woodroe Mode, MD;  Location: [facility];  Service: Gynecology;  Laterality: N/A;  . Salpingoophorectomy  08/06/2011    Procedure: SALPINGO OOPHERECTOMY;  Surgeon: Woodroe Mode, MD;  Location: [facility];  Service: Gynecology;  Laterality: Right;    The following portions of the patient's history were reviewed and updated as appropriate: allergies, current medications, past family history, past medical history, past social history, past surgical history and problem list.   Health Maintenance:  Normal pap and positive HRHPV, negative HPV 16, 18/45 on 02/24/14; plan to repeat cotesting in one year.  Normal mammogram on 06/14/14.    Review of Systems:  Pertinent items are noted in HPI. Comprehensive review of systems was otherwise  negative.  Objective:  Physical Exam BP 115/74 mmHg  Pulse 88  Ht 5' 11.5" (1.816 m)  Wt 205 lb (92.987 kg)  BMI 28.20 kg/m2  LMP 08/29/2014 (Exact Date) CONSTITUTIONAL: Well-developed, well-nourished female in no acute distress.  HENT:  Normocephalic, atraumatic, External right and left ear normal. Oropharynx is clear and moist EYES: Conjunctivae and EOM are normal. Pupils are equal, round, and reactive to light. No scleral icterus.  NECK: Normal range of motion, supple, no masses SKIN: Skin is warm and dry. No rash noted. Not diaphoretic. No erythema. No pallor. [place]: Alert and oriented to person, place, and time. Normal reflexes, muscle tone coordination. No cranial nerve deficit noted. PSYCHIATRIC: Normal mood and affect. Normal behavior. Normal judgment and thought content. CARDIOVASCULAR: Normal heart rate noted RESPIRATORY: Effort and breath sounds normal, no problems with respiration noted ABDOMEN: Soft, no distention noted.  No tenderness, rebound or guarding.  PELVIC: Normal appearing external genitalia; normal appearing vaginal mucosa and cervix.  Scant thin white discharge noted, wet prep done MUSCULOSKELETAL: Normal range of motion. No edema and no tenderness.   Assessment & Plan:  Will follow up wet prep results and manage accordingly. Sample of Hylafem (boric acid) was given to patient in the meantime to help with vaginal pH.  Proper vulvar hygiene emphasized: discussed  avoidance of perfumed soaps, detergents, lotions and any type of douches; in addition to wearing cotton underwear and no underwear at night.  Also recommended cleaning front to back, voiding and cleaning up after intercourse.  Routine preventative health maintenance measures emphasized. Please refer to After Visit Summary for other counseling recommendations.    Verita Schneiders, MD, [facility] for [employer], [place]

## 2014-09-22 NOTE — Addendum Note (Signed)
Addended by: Lin Landsman C on: 09/22/2014 04:14 PM   Modules accepted: Orders

## 2014-09-22 NOTE — Progress Notes (Signed)
Vaginal irritation 

## 2014-09-23 ENCOUNTER — Encounter: Payer: Self-pay | Admitting: Obstetrics & Gynecology

## 2014-09-23 LAB — WET PREP, GENITAL
Trich, Wet Prep: NONE SEEN
Yeast Wet Prep HPF POC: NONE SEEN

## 2014-09-23 MED ORDER — METRONIDAZOLE 500 MG PO TABS: 500.0000 mg | ORAL_TABLET | Freq: Two times a day (BID) | ORAL | Status: DC

## 2014-09-23 NOTE — Addendum Note (Signed)
Addended by: Verita Schneiders A on: 09/23/2014 01:07 PM   Modules accepted: Orders

## 2014-10-22 ENCOUNTER — Other Ambulatory Visit: Payer: Self-pay | Admitting: Obstetrics & Gynecology

## 2015-01-21 ENCOUNTER — Other Ambulatory Visit: Payer: Self-pay | Admitting: Obstetrics & Gynecology

## 2015-03-14 ENCOUNTER — Ambulatory Visit: Payer: Medicaid Other | Admitting: Obstetrics & Gynecology

## 2015-03-17 ENCOUNTER — Ambulatory Visit (INDEPENDENT_AMBULATORY_CARE_PROVIDER_SITE_OTHER): Payer: Medicaid Other | Admitting: Obstetrics and Gynecology

## 2015-03-17 ENCOUNTER — Other Ambulatory Visit (HOSPITAL_COMMUNITY)
Admission: RE | Admit: 2015-03-17 | Discharge: 2015-03-17 | Disposition: A | Discharge status: Home / Self Care | Payer: Medicaid Other | Source: Ambulatory Visit | Attending: Obstetrics and Gynecology | Admitting: Obstetrics and Gynecology

## 2015-03-17 ENCOUNTER — Encounter: Payer: Self-pay | Admitting: Obstetrics and Gynecology

## 2015-03-17 ENCOUNTER — Other Ambulatory Visit: Payer: Self-pay | Admitting: Obstetrics and Gynecology

## 2015-03-17 VITALS — BP 120/79 | HR 93 | Wt 204.0 lb

## 2015-03-17 DIAGNOSIS — Z01419 Encounter for gynecological examination (general) (routine) without abnormal findings: Secondary | ICD-10-CM | POA: Diagnosis present

## 2015-03-17 DIAGNOSIS — Z0000 Encounter for general adult medical examination without abnormal findings: Secondary | ICD-10-CM

## 2015-03-17 DIAGNOSIS — Z1151 Encounter for screening for human papillomavirus (HPV): Secondary | ICD-10-CM | POA: Diagnosis present

## 2015-03-17 DIAGNOSIS — D259 Leiomyoma of uterus, unspecified: Secondary | ICD-10-CM

## 2015-03-17 NOTE — Progress Notes (Signed)
Subjective:     Christine Vega is a 44 y.o. female G2P2 who is here for a comprehensive physical exam. The patient reports no problems. She is sexually active using condoms for STD prevention and BTL for contraception. She reports monthly menstrual cycles of 7 days which are heavy in flow for the first 3-4 days. She reports some dysmenorrhea but the increase in flow is now bothering her  Past Medical History  Diagnosis Date  . Depression   . Endometriosis   . Hyperlipidemia   . Heart murmur     no problems  . Anxiety   . History of seasonal allergies   . Anemia     history   . Seizures ([place]) 2004    x 1; unknown source  . Back pain     rotates to left hip and tailbone  . Neuromuscular disorder ([place])     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, walks/balance ok  . Migraines     tx w/depakote/verapamil per pt  . Post traumatic stress disorder (PTSD)   . Fractured coccyx ([place])   . Murmur, heart   . Migraine    Past Surgical History  Procedure Laterality Date  . Cholecystectomy    . Hernia repair    . Tubal ligation    . Svd      x 2  . Wisdom tooth extraction    . Laparotomy      for endometriosis/adhesions  . Laparoscopy  05/16/2011    Procedure: LAPAROSCOPY OPERATIVE;  Surgeon: Emeterio Reeve, MD;  Location: [facility];  Service: Gynecology;  Laterality: N/A;  poss oophorectomy  . Laparoscopy  08/06/2011    Procedure: LAPAROSCOPY OPERATIVE;  Surgeon: Woodroe Mode, MD;  Location: [facility];  Service: Gynecology;  Laterality: N/A;  . Salpingoophorectomy  08/06/2011    Procedure: SALPINGO OOPHERECTOMY;  Surgeon: Woodroe Mode, MD;  Location: [facility];  Service: Gynecology;  Laterality: Right;   History reviewed. No pertinent family history.   Social History   Social History  . Marital Status: Legally Separated    Spouse Name: N/A  . Number of Children: N/A  . Years of Education: N/A   Occupational History  . Not on file.   Social History Main Topics  . Smoking status: Never Smoker    . Smokeless tobacco: Never Used  . Alcohol Use: No  . Drug Use: No  . Sexual Activity: Not Currently    Birth Control/ Protection: Surgical   Other Topics Concern  . Not on file   Social History Narrative   Health Maintenance  Topic Date Due  . Samul Dada  09/24/1987  . INFLUENZA VACCINE  11/15/2014  . PAP SMEAR  02/24/2017  . HIV Screening  Completed       Review of Systems Pertinent items are noted in HPI.   Objective:      GENERAL: Well-developed, well-nourished female in no acute distress.  HEENT: Normocephalic, atraumatic. Sclerae anicteric.  NECK: Supple. Normal thyroid.  LUNGS: Clear to auscultation bilaterally.  HEART: Regular rate and rhythm. BREASTS: Symmetric in size. No palpable masses or lymphadenopathy, skin changes, or nipple drainage. ABDOMEN: Soft, nontender, nondistended. No organomegaly. PELVIC: Normal external female genitalia. Vagina is pink and rugated.  Normal discharge. Normal appearing cervix. Uterus is normal in size. No adnexal mass or tenderness. EXTREMITIES: No cyanosis, clubbing, or edema, 2+ distal pulses.    Assessment:    Healthy female exam.      Plan:   Pap smear collected  Discussed medical management of her menorrhagia with contraception such as Mirena IUD. Patient is interested in [place]. Will provide referral to IR for evaluation Patient due for mammogram in February Patient will be contacted with any abnormal results  See After Visit Summary for Counseling Recommendations

## 2015-03-18 LAB — CYTOLOGY - PAP

## 2015-03-21 ENCOUNTER — Encounter: Payer: Self-pay | Admitting: *Deleted

## 2015-03-24 ENCOUNTER — Other Ambulatory Visit: Payer: Medicaid Other

## 2015-04-21 ENCOUNTER — Other Ambulatory Visit: Payer: Medicaid Other

## 2015-05-03 ENCOUNTER — Telehealth: Payer: Self-pay | Admitting: *Deleted

## 2015-05-03 DIAGNOSIS — N898 Other specified noninflammatory disorders of vagina: Secondary | ICD-10-CM

## 2015-05-03 MED ORDER — FLUCONAZOLE 150 MG PO TABS: 150.0000 mg | ORAL_TABLET | Freq: Once | ORAL | Status: DC

## 2015-05-03 NOTE — Telephone Encounter (Signed)
-----   Message from Francia Greaves sent at 05/03/2015  8:32 AM EST ----- Regarding: Rx Request Contact: [phone] Would like something called in for a yeast infection

## 2015-05-03 NOTE — Telephone Encounter (Signed)
Spoke to pt, c/o a discharge that is consistent with yeast.  Sent rx for Diflucan to pharmacy, instructed on medication use and if symptoms persist after treatment pt to schedule appt.

## 2015-05-04 ENCOUNTER — Other Ambulatory Visit: Payer: Self-pay

## 2015-05-04 DIAGNOSIS — Z1231 Encounter for screening mammogram for malignant neoplasm of breast: Secondary | ICD-10-CM

## 2015-05-27 ENCOUNTER — Other Ambulatory Visit: Payer: Self-pay | Admitting: Obstetrics & Gynecology

## 2015-06-13 ENCOUNTER — Telehealth: Payer: Self-pay | Admitting: *Deleted

## 2015-06-13 DIAGNOSIS — B9689 Other specified bacterial agents as the cause of diseases classified elsewhere: Secondary | ICD-10-CM

## 2015-06-13 DIAGNOSIS — N760 Acute vaginitis: Principal | ICD-10-CM

## 2015-06-13 MED ORDER — METRONIDAZOLE 500 MG PO TABS: 500.0000 mg | ORAL_TABLET | Freq: Two times a day (BID) | ORAL | Status: DC

## 2015-06-13 NOTE — Telephone Encounter (Signed)
Pt states she switched her body soap and now is experiencing a fishy vaginal discharge.  Sent rx for Flagyl to pharmacy and instructed pt on medication use.

## 2015-06-13 NOTE — Telephone Encounter (Signed)
-----   Message from Francia Greaves sent at 06/13/2015  1:02 PM EST ----- Regarding: Rx Request Contact: [phone] Wants something for BV

## 2015-06-14 ENCOUNTER — Ambulatory Visit: Payer: Medicaid Other

## 2015-06-24 ENCOUNTER — Ambulatory Visit: Payer: Medicaid Other

## 2015-07-01 ENCOUNTER — Ambulatory Visit: Payer: Medicaid Other

## 2015-07-05 ENCOUNTER — Other Ambulatory Visit: Payer: Medicaid Other

## 2015-07-06 ENCOUNTER — Ambulatory Visit: Payer: Medicaid Other

## 2015-07-07 ENCOUNTER — Other Ambulatory Visit (HOSPITAL_COMMUNITY): Payer: Self-pay | Admitting: Interventional Radiology

## 2015-07-07 ENCOUNTER — Ambulatory Visit
Admission: RE | Admit: 2015-07-07 | Discharge: 2015-07-07 | Disposition: A | Discharge status: Home / Self Care | Payer: Medicaid Other | Source: Ambulatory Visit | Attending: Obstetrics and Gynecology | Admitting: Obstetrics and Gynecology

## 2015-07-07 DIAGNOSIS — D259 Leiomyoma of uterus, unspecified: Secondary | ICD-10-CM

## 2015-07-07 NOTE — Consult Note (Signed)
Chief Complaint: Patient was seen in consultation today for  Chief Complaint  Patient presents with  . Advice Only    Consult for [place]     at the request of Constant,Peggy  Referring Physician(s): Constant,Peggy  History of Present Illness: Christine Vega is a 45 y.o. female with a chief complaint of menorrhagia. She presents at the kind request of Dr. Mora Bellman.  Mr. Deguire has struggled with menorrhagia and a diagnosis of uterine fibroids since 2015. She notes that over the last several years her menstrual cycles have become progressively heavier with passage of larger and larger clots. Currently her cycles are regular occurring every 28 days and last for approximately 7 days. The first 3 or 4 days are particularly heavy with passage of clots. She has to wear super absorbent overnight pads and changes them every 30-60 minutes. She has been documented with anemia. Her last hemoglobin was 11.1. She currently takes iron 325 mg daily which seems to help her symptoms of fatigue.  She has not tried any prior therapies for fibroids. Her last Pap smear was performed this past December and was negative. She has a more remote past history of endometrial biopsy which is also reportedly negative. In April 2016 a transvaginal ultrasound identified a 3.2 cm right-sided uterine fibroid.  She denies current symptoms of vaginal discharge, dysuria, constipation or dyspareunia. She is family complete and has had a prior bilateral tubal ligation. Past Medical History  Diagnosis Date  . Depression   . Endometriosis   . Hyperlipidemia   . Heart murmur     no problems  . Anxiety   . History of seasonal allergies   . Anemia     history   . Seizures ([place]) 2004    x 1; unknown source  . Back pain     rotates to left hip and tailbone  . Neuromuscular disorder ([place])     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, walks/balance ok  . Migraines     tx w/depakote/verapamil per pt  . Post traumatic stress  disorder (PTSD)   . Fractured coccyx ([place])   . Murmur, heart   . Migraine     Past Surgical History  Procedure Laterality Date  . Cholecystectomy    . Hernia repair    . Tubal ligation    . Svd      x 2  . Wisdom tooth extraction    . Laparotomy      for endometriosis/adhesions  . Laparoscopy  05/16/2011    Procedure: LAPAROSCOPY OPERATIVE;  Surgeon: Emeterio Reeve, MD;  Location: [facility];  Service: Gynecology;  Laterality: N/A;  poss oophorectomy  . Laparoscopy  08/06/2011    Procedure: LAPAROSCOPY OPERATIVE;  Surgeon: Woodroe Mode, MD;  Location: [facility];  Service: Gynecology;  Laterality: N/A;  . Salpingoophorectomy  08/06/2011    Procedure: SALPINGO OOPHERECTOMY;  Surgeon: Woodroe Mode, MD;  Location: [facility];  Service: Gynecology;  Laterality: Right;    Allergies: Penicillins; Prednisone; and Zithromax  Medications: Prior to Admission medications   Medication Sig Start Date End Date Taking? Authorizing Provider  buPROPion (WELLBUTRIN XL) 300 MG 24 hr tablet Take 300 mg by mouth daily as needed (depression).    Yes Historical Provider, MD  cholecalciferol (VITAMIN D) 1000 UNITS tablet Take 1,000 Units by mouth daily.   Yes Historical Provider, MD  diphenhydrAMINE (BENADRYL) 25 MG tablet Take 1 tablet (25 mg total) by mouth every 6 (six) hours. 04/06/14  Yes Harvie Heck, PA-C  divalproex (DEPAKOTE) 500 MG 24 hr tablet Take 500 mg by mouth 2 (two) times daily.     Yes Historical Provider, MD  ferrous sulfate 325 (65 FE) MG tablet Take 325 mg by mouth daily with breakfast.   Yes Historical Provider, MD  fluconazole (DIFLUCAN) 150 MG tablet Take 1 tablet (150 mg total) by mouth once. Can take additional dose three days later if symptoms persist 05/03/15  Yes Ugonna A Anyanwu, MD  fluticasone (FLONASE) 50 MCG/ACT nasal spray Place 2 sprays into the nose daily as needed for allergies (allergies). Each nostril   Yes Historical Provider, MD  hydrocortisone cream 1 % Apply 1 application  topically 2 (two) times daily. Do not apply to face Patient taking differently: Apply 1 application topically 2 (two) times daily as needed for itching (itching). Do not apply to face 04/06/14  Yes Harvie Heck, PA-C  ipratropium (ATROVENT) 0.06 % nasal spray Place 2 sprays into both nostrils 4 (four) times daily. Patient taking differently: Place 2 sprays into both nostrils 4 (four) times daily as needed (allergies).  03/22/14  Yes Billy Fischer, MD  loratadine (CLARITIN) 10 MG tablet Take 10 mg by mouth daily as needed for allergies (allergies).    Yes Historical Provider, MD  LORazepam (ATIVAN) 1 MG tablet Take 1.5 mg by mouth every 6 (six) hours as needed for anxiety. For anxiety.   Yes Historical Provider, MD  magnesium oxide (MAGOX 400) 400 (241.3 MG) MG tablet Take 800 mg by mouth. 01/27/15  Yes Historical Provider, MD  nortriptyline (PAMELOR) 50 MG capsule Take 100 mg by mouth. 01/27/15  Yes Historical Provider, MD  oxyCODONE (OXY IR/ROXICODONE) 5 MG immediate release tablet Take 5 mg by mouth every 4 (four) hours as needed. For pain   Yes Historical Provider, MD  promethazine (PROMETHEGAN) 50 MG suppository Place 50 mg rectally. Reported on 07/07/2015 01/27/15  Yes Historical Provider, MD  sertraline (ZOLOFT) 100 MG tablet Take 150 mg by mouth daily.   Yes Historical Provider, MD  tiZANidine (ZANAFLEX) 4 MG tablet Take 4 mg by mouth every 6 (six) hours as needed for muscle spasms (muscle spasms).    Yes Historical Provider, MD  verapamil (CALAN-SR) 120 MG CR tablet Take 120 mg by mouth at bedtime.   Yes Historical Provider, MD  vitamin E 100 UNIT capsule Take 100 Units by mouth daily.   Yes Historical Provider, MD  ZOLMitriptan 2.5 MG SOLN Place 1 spray into the nose. 01/27/15  Yes Historical Provider, MD  fluconazole (DIFLUCAN) 150 MG tablet TAKE 1 TABLET BY MOUTH ONCE; MAY TAKE ADDITIONAL DOSE 3 DAYS LATER IF SYMPTOMS PERSIST. 05/30/15   Osborne Oman, MD  metroNIDAZOLE (FLAGYL) 500 MG  tablet Take 1 tablet (500 mg total) by mouth 2 (two) times daily. Patient not taking: Reported on 07/07/2015 09/23/14   Osborne Oman, MD  metroNIDAZOLE (FLAGYL) 500 MG tablet Take 1 tablet (500 mg total) by mouth 2 (two) times daily. Patient not taking: Reported on 07/07/2015 06/13/15   Mora Bellman, MD  predniSONE (DELTASONE) 10 MG tablet Take 3 tablets (30 mg total) by mouth daily. Patient not taking: Reported on 07/07/2015 10/31/13   Gregor Hams, MD     No family history on file.  Social History   Social History  . Marital Status: Legally Separated    Spouse Name: N/A  . Number of Children: N/A  . Years of Education: N/A   Social History Main Topics  .  Smoking status: Never Smoker   . Smokeless tobacco: Never Used  . Alcohol Use: No  . Drug Use: No  . Sexual Activity: Not Currently    Birth Control/ Protection: Surgical   Other Topics Concern  . Not on file   Social History Narrative   Review of Systems: A 12 point ROS discussed and pertinent positives are indicated in the HPI above.  All other systems are negative.  Review of Systems  Vital Signs: BP 115/72 mmHg  Pulse 90  Temp(Src) 98.9 F (37.2 C) (Oral)  Resp 15  Ht 5' 11.5" (1.816 m)  Wt 209 lb (94.802 kg)  BMI 28.75 kg/m2  SpO2 99%  LMP 06/29/2015 (Exact Date)  Physical Exam  Constitutional: She is oriented to person, place, and time. She appears well-developed and well-nourished. No distress.  HENT:  Head: Normocephalic and atraumatic.  Eyes: No scleral icterus.  Neck: No thyromegaly present.  Cardiovascular: Normal rate, regular rhythm and normal heart sounds.   Pulses:      Dorsalis pedis pulses are 2+ on the right side, and 2+ on the left side.  Pulmonary/Chest: Effort normal and breath sounds normal.  Abdominal: Soft. Bowel sounds are normal. She exhibits no distension and no mass. There is no tenderness.  Neurological: She is alert and oriented to person, place, and time.  Skin: Skin is warm  and dry.  Psychiatric: She has a normal mood and affect. Her behavior is normal.  Nursing note and vitals reviewed.   Imaging: No results found.  Labs:  CBC: No results for input(s): WBC, HGB, HCT, PLT in the last 8760 hours.  COAGS: No results for input(s): INR, APTT in the last 8760 hours.  BMP: No results for input(s): NA, K, CL, CO2, GLUCOSE, BUN, CALCIUM, CREATININE, GFRNONAA, GFRAA in the last 8760 hours.  Invalid input(s): CMP  LIVER FUNCTION TESTS: No results for input(s): BILITOT, AST, ALT, ALKPHOS, PROT, ALBUMIN in the last 8760 hours.  TUMOR MARKERS: No results for input(s): AFPTM, CEA, CA199, CHROMGRNA in the last 8760 hours.  Assessment and Plan:  45 year old female with symptomatic uterine fibroids. We spent time discussing the natural history of uterine fibroids and the various treatment options including hormonal therapy, endometrial ablation, uterine artery embolization and hysterectomy.  At this time she is most interested in uterine artery embolization and I feel she would be a good candidate. We discussed the potential risks including arterial access site hematoma, arterial injury, uterine infarction, post procedural infection, early menopause and treatment failure. Time was given for questions.  1.) Please schedule for preoperative planning MRI of the pelvis with and without gadolinium contrast.  2.) Barring any unexpected findings we will then schedule her for uterine artery embolization.  She would like to have the procedure done before her next cycle if possible.  Thank you for this interesting consult.  I greatly enjoyed meeting Christine Vega and look forward to participating in their care.  A copy of this report was sent to the requesting provider on this date.  Electronically Signed: Jacqulynn Cadet 07/07/2015, 4:14 PM   I spent a total of  40 Minutes in face to face in clinical consultation, greater than 50% of which was  counseling/coordinating care for symptomatic uterine fibroids.

## 2015-07-08 ENCOUNTER — Ambulatory Visit
Admission: RE | Admit: 2015-07-08 | Discharge: 2015-07-08 | Disposition: A | Discharge status: Home / Self Care | Payer: Medicaid Other | Source: Ambulatory Visit

## 2015-07-08 DIAGNOSIS — Z1231 Encounter for screening mammogram for malignant neoplasm of breast: Secondary | ICD-10-CM

## 2015-07-13 ENCOUNTER — Other Ambulatory Visit: Payer: Self-pay

## 2015-07-13 DIAGNOSIS — Z1231 Encounter for screening mammogram for malignant neoplasm of breast: Secondary | ICD-10-CM

## 2015-07-19 ENCOUNTER — Ambulatory Visit (HOSPITAL_COMMUNITY): Admission: RE | Admit: 2015-07-19 | Payer: Medicaid Other | Source: Ambulatory Visit

## 2015-07-22 ENCOUNTER — Ambulatory Visit (HOSPITAL_COMMUNITY): Payer: Medicaid Other

## 2015-08-04 ENCOUNTER — Encounter (HOSPITAL_COMMUNITY): Payer: Self-pay | Admitting: Emergency Medicine

## 2015-08-04 ENCOUNTER — Ambulatory Visit (HOSPITAL_COMMUNITY)
Admission: EM | Admit: 2015-08-04 | Discharge: 2015-08-04 | Disposition: A | Discharge status: Home / Self Care | Payer: Medicaid Other | Attending: Family Medicine | Admitting: Family Medicine

## 2015-08-04 DIAGNOSIS — G43009 Migraine without aura, not intractable, without status migrainosus: Secondary | ICD-10-CM | POA: Diagnosis not present

## 2015-08-04 MED ORDER — ONDANSETRON 4 MG PO TBDP
4.0000 mg | ORAL_TABLET | Freq: Once | ORAL | Status: AC
  Administered 2015-08-04: 4 mg via ORAL

## 2015-08-04 MED ORDER — NAPROXEN 375 MG PO TABS: 375.0000 mg | ORAL_TABLET | Freq: Two times a day (BID) | ORAL | Status: DC

## 2015-08-04 MED ORDER — ONDANSETRON 4 MG PO TBDP
ORAL_TABLET | ORAL | Status: AC
  Filled 2015-08-04: qty 1

## 2015-08-04 MED ORDER — KETOROLAC TROMETHAMINE 60 MG/2ML IM SOLN
60.0000 mg | Freq: Once | INTRAMUSCULAR | Status: AC
  Administered 2015-08-04: 60 mg via INTRAMUSCULAR

## 2015-08-04 MED ORDER — KETOROLAC TROMETHAMINE 60 MG/2ML IM SOLN
INTRAMUSCULAR | Status: AC
  Filled 2015-08-04: qty 2

## 2015-08-04 NOTE — ED Provider Notes (Signed)
CSN: [id number]     Arrival date & time 08/04/15  1338 History   First MD Initiated Contact with Patient 08/04/15 1516     Chief Complaint  Patient presents with  . Migraine   (Consider location/radiation/quality/duration/timing/severity/associated sxs/prior Treatment) Patient is a 45 y.o. female presenting with migraines. The history is provided by the patient.  Migraine This is a chronic problem. The current episode started 6 to 12 hours ago. The problem occurs constantly. The problem has not changed since onset.Associated symptoms include headaches. The symptoms are aggravated by exertion. Nothing relieves the symptoms. She has tried nothing for the symptoms.    Past Medical History  Diagnosis Date  . Depression   . Endometriosis   . Hyperlipidemia   . Heart murmur     no problems  . Anxiety   . History of seasonal allergies   . Anemia     history   . Seizures ([place]) 2004    x 1; unknown source  . Back pain     rotates to left hip and tailbone  . 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 Migraines     tx w/depakote/verapamil per pt  . Post traumatic stress disorder (PTSD)   . Fractured coccyx ([place])   . Murmur, heart   . Migraine    Past Surgical History  Procedure Laterality Date  . Cholecystectomy    . Hernia repair    . Tubal ligation    . Svd      x 2  . Wisdom tooth extraction    . Laparotomy      for endometriosis/adhesions  . Laparoscopy  05/16/2011    Procedure: LAPAROSCOPY OPERATIVE;  Surgeon: Emeterio Reeve, MD;  Location: [facility];  Service: Gynecology;  Laterality: N/A;  poss oophorectomy  . Laparoscopy  08/06/2011    Procedure: LAPAROSCOPY OPERATIVE;  Surgeon: Woodroe Mode, MD;  Location: [facility];  Service: Gynecology;  Laterality: N/A;  . Salpingoophorectomy  08/06/2011    Procedure: SALPINGO OOPHERECTOMY;  Surgeon: Woodroe Mode, MD;  Location: [facility];  Service: Gynecology;  Laterality: Right;   No family history on  file. Social History  Substance Use Topics  . Smoking status: Never Smoker   . Smokeless tobacco: Never Used  . Alcohol Use: No   OB History    Gravida Para Term Preterm AB TAB SAB Ectopic Multiple Living   2 2 2  0 0 0 0 0 0 2     Review of Systems  Constitutional: Negative.   Eyes: Negative.   Respiratory: Negative.   Cardiovascular: Negative.   Endocrine: Negative.   Genitourinary: Negative.   Musculoskeletal: Negative.   Skin: Negative.   Neurological: Positive for headaches.  Hematological: Negative.   Psychiatric/Behavioral: Negative.     Allergies  Penicillins; Prednisone; and Zithromax  Home Medications   Prior to Admission medications   Medication Sig Start Date End Date Taking? Authorizing Provider  ZOLMitriptan 2.5 MG SOLN Place 1 spray into the nose. 01/27/15  Yes Historical Provider, MD  buPROPion (WELLBUTRIN XL) 300 MG 24 hr tablet Take 300 mg by mouth daily as needed (depression).     Historical Provider, MD  cholecalciferol (VITAMIN D) 1000 UNITS tablet Take 1,000 Units by mouth daily.    Historical Provider, MD  diphenhydrAMINE (BENADRYL) 25 MG tablet Take 1 tablet (25 mg total) by mouth every 6 (six) hours. 04/06/14   Harvie Heck, PA-C  divalproex (DEPAKOTE) 500  MG 24 hr tablet Take 500 mg by mouth 2 (two) times daily.      Historical Provider, MD  ferrous sulfate 325 (65 FE) MG tablet Take 325 mg by mouth daily with breakfast.    Historical Provider, MD  fluconazole (DIFLUCAN) 150 MG tablet Take 1 tablet (150 mg total) by mouth once. Can take additional dose three days later if symptoms persist 05/03/15   Osborne Oman, MD  fluconazole (DIFLUCAN) 150 MG tablet TAKE 1 TABLET BY MOUTH ONCE; MAY TAKE ADDITIONAL DOSE 3 DAYS LATER IF SYMPTOMS PERSIST. 05/30/15   Osborne Oman, MD  fluticasone (FLONASE) 50 MCG/ACT nasal spray Place 2 sprays into the nose daily as needed for allergies (allergies). Each nostril    Historical Provider, MD  hydrocortisone cream  1 % Apply 1 application topically 2 (two) times daily. Do not apply to face Patient taking differently: Apply 1 application topically 2 (two) times daily as needed for itching (itching). Do not apply to face 04/06/14   Harvie Heck, PA-C  ipratropium (ATROVENT) 0.06 % nasal spray Place 2 sprays into both nostrils 4 (four) times daily. Patient taking differently: Place 2 sprays into both nostrils 4 (four) times daily as needed (allergies).  03/22/14   Billy Fischer, MD  loratadine (CLARITIN) 10 MG tablet Take 10 mg by mouth daily as needed for allergies (allergies).     Historical Provider, MD  LORazepam (ATIVAN) 1 MG tablet Take 1.5 mg by mouth every 6 (six) hours as needed for anxiety. For anxiety.    Historical Provider, MD  magnesium oxide (MAGOX 400) 400 (241.3 MG) MG tablet Take 800 mg by mouth. 01/27/15   Historical Provider, MD  metroNIDAZOLE (FLAGYL) 500 MG tablet Take 1 tablet (500 mg total) by mouth 2 (two) times daily. Patient not taking: Reported on 07/07/2015 09/23/14   Osborne Oman, MD  metroNIDAZOLE (FLAGYL) 500 MG tablet Take 1 tablet (500 mg total) by mouth 2 (two) times daily. Patient not taking: Reported on 07/07/2015 06/13/15   Mora Bellman, MD  nortriptyline (PAMELOR) 50 MG capsule Take 100 mg by mouth. 01/27/15   Historical Provider, MD  oxyCODONE (OXY IR/ROXICODONE) 5 MG immediate release tablet Take 5 mg by mouth every 4 (four) hours as needed. For pain    Historical Provider, MD  predniSONE (DELTASONE) 10 MG tablet Take 3 tablets (30 mg total) by mouth daily. Patient not taking: Reported on 07/07/2015 10/31/13   Gregor Hams, MD  promethazine (PROMETHEGAN) 50 MG suppository Place 50 mg rectally. Reported on 07/07/2015 01/27/15   Historical Provider, MD  sertraline (ZOLOFT) 100 MG tablet Take 150 mg by mouth daily.    Historical Provider, MD  tiZANidine (ZANAFLEX) 4 MG tablet Take 4 mg by mouth every 6 (six) hours as needed for muscle spasms (muscle spasms).     Historical  Provider, MD  verapamil (CALAN-SR) 120 MG CR tablet Take 120 mg by mouth at bedtime.    Historical Provider, MD  vitamin E 100 UNIT capsule Take 100 Units by mouth daily.    Historical Provider, MD   Meds Ordered and Administered this Visit  Medications - No data to display  BP 129/98 mmHg  Pulse 78  Temp(Src) 98 F (36.7 C) (Oral)  Resp 16  SpO2 100%  LMP 07/28/2015 No data found.   Physical Exam  Constitutional: She is oriented to person, place, and time. She appears well-developed and well-nourished.  HENT:  Head: Normocephalic and atraumatic.  Right Ear: External  ear normal.  Left Ear: External ear normal.  Mouth/Throat: Oropharynx is clear and moist.  Eyes: Conjunctivae and EOM are normal. Pupils are equal, round, and reactive to light.  Neck: Normal range of motion. Neck supple.  Cardiovascular: Normal rate, regular rhythm and normal heart sounds.   Pulmonary/Chest: Effort normal and breath sounds normal.  Abdominal: Soft. Bowel sounds are normal.  Neurological: She is alert and oriented to person, place, and time.  Skin: Skin is warm and dry.  Psychiatric: She has a normal mood and affect.    ED Course  Procedures (including critical care time)  Labs Review Labs Reviewed - No data to display  Imaging Review No results found.   Visual Acuity Review  Right Eye Distance:   Left Eye Distance:   Bilateral Distance:    Right Eye Near:   Left Eye Near:    Bilateral Near:         MDM  Migraine - Toradol 60mg  IM now.  Naprosyn 375mg  one po bid x 10 days #20.  Take zomig when you get home as rx'd for abortive tx of headache.  Take nausea medicine at home.   Lysbeth Penner, FNP 08/04/15 1536

## 2015-08-04 NOTE — Discharge Instructions (Signed)

## 2015-08-04 NOTE — ED Notes (Signed)
PT reports a migraine headache that started at 4am. PT reports blurred vision and nausea with headache. PT reports a history of migraines. PT was supposed to see a specialist at [facility] today for migraines. Pt could not make that appointment due to pain, vision difficulties, and nausea.

## 2015-09-29 ENCOUNTER — Other Ambulatory Visit: Payer: Self-pay | Admitting: Obstetrics & Gynecology

## 2015-10-27 ENCOUNTER — Telehealth: Payer: Self-pay | Admitting: *Deleted

## 2015-10-27 DIAGNOSIS — N760 Acute vaginitis: Principal | ICD-10-CM

## 2015-10-27 DIAGNOSIS — B9689 Other specified bacterial agents as the cause of diseases classified elsewhere: Secondary | ICD-10-CM

## 2015-10-27 MED ORDER — METRONIDAZOLE 500 MG PO TABS: 500.0000 mg | ORAL_TABLET | Freq: Two times a day (BID) | ORAL | Status: DC

## 2015-10-27 MED ORDER — FLUCONAZOLE 150 MG PO TABS
150.0000 mg | ORAL_TABLET | Freq: Once | ORAL | Status: DC
Start: 2015-10-27 — End: 2016-02-07

## 2015-10-27 NOTE — Telephone Encounter (Signed)
-----   Message from Francia Greaves sent at 10/27/2015 10:54 AM EDT ----- Regarding: Advise Contact: [phone] Thinks she has BV

## 2015-10-27 NOTE — Telephone Encounter (Signed)
Pt has a history of recurrent BV, states she is experiencing discharge that is persistent with BV.  Sent rx for Flagyl per protocol.  Pt states she sometimes develops yeast after taking Flagyl, one dose of Diflucan sent to pharmacy.  Instructed on medication use.

## 2015-11-10 ENCOUNTER — Institutional Professional Consult (permissible substitution): Payer: Medicaid Other | Admitting: Neurology

## 2015-11-10 ENCOUNTER — Telehealth: Payer: Self-pay

## 2015-11-10 IMAGING — US US PELVIS COMPLETE
1 series · 13 of 25 positions shown · non-contrast
Comparison: 05/18/2011

CLINICAL DATA: Pelvic pain. Right oophorectomy 6092 for ovarian
dermoid.

EXAM:
TRANSABDOMINAL AND TRANSVAGINAL ULTRASOUND OF PELVIS
TECHNIQUE: Both transabdominal and transvaginal ultrasound examinations of the
pelvis were performed. Transabdominal technique was performed for
global imaging of the pelvis including uterus, ovaries, adnexal
regions, and pelvic cul-de-sac. It was necessary to proceed with
endovaginal exam following the transabdominal exam to visualize the
endometrium and left ovary.

[Series 1: us pelvis complete · 13 of 80 slices shown]
[im 1/80]
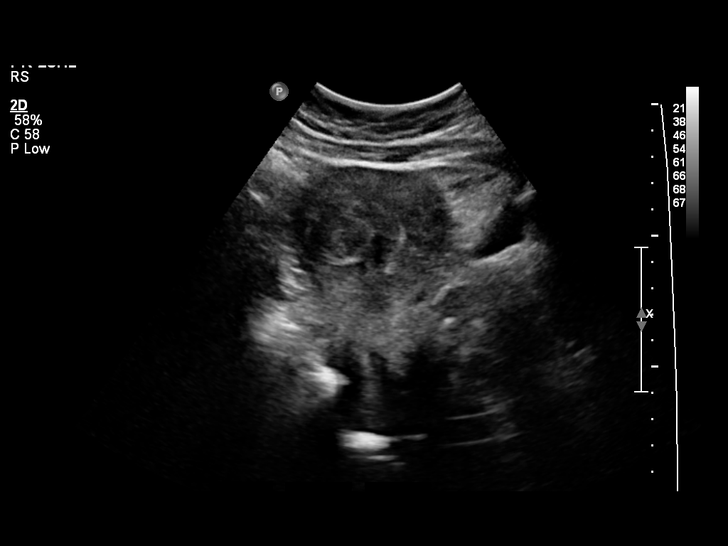
[im 7/80]
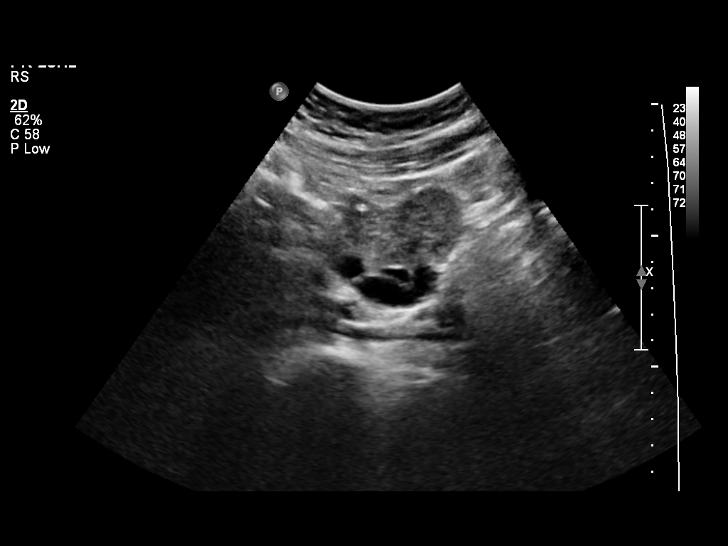
[im 14/80]
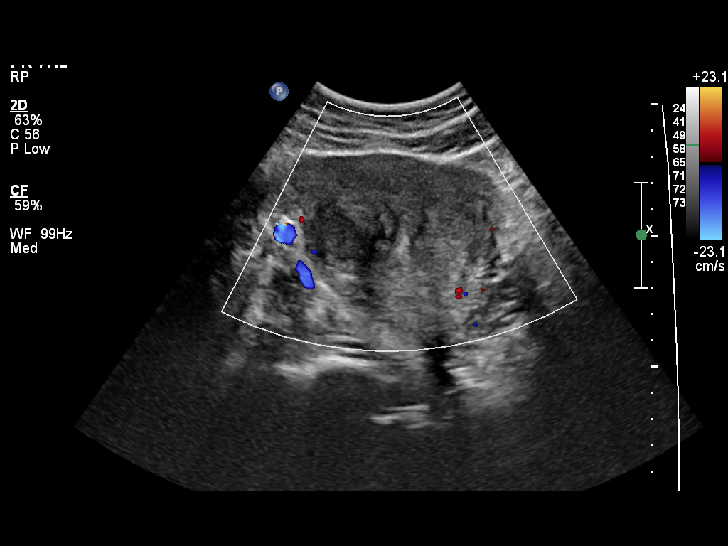
[im 20/80]
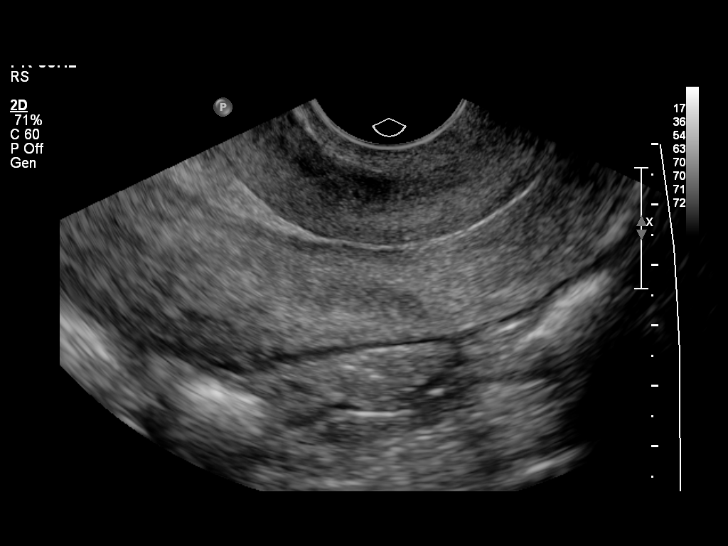
[im 27/80]
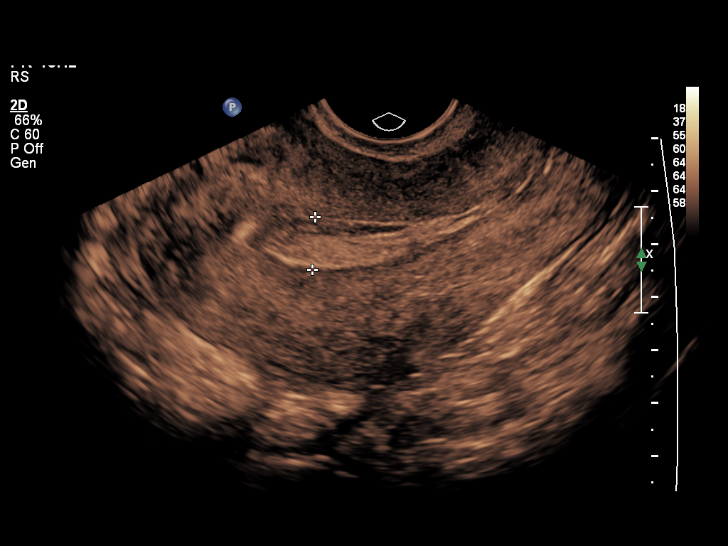
[im 33/80]
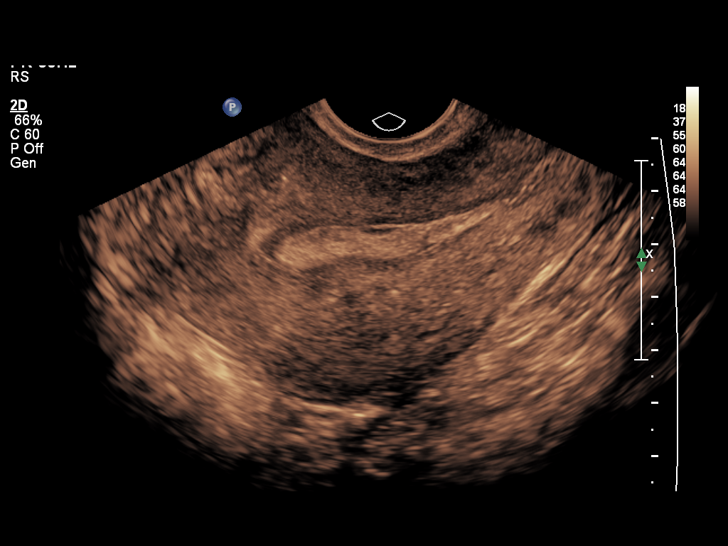
[im 40/80]
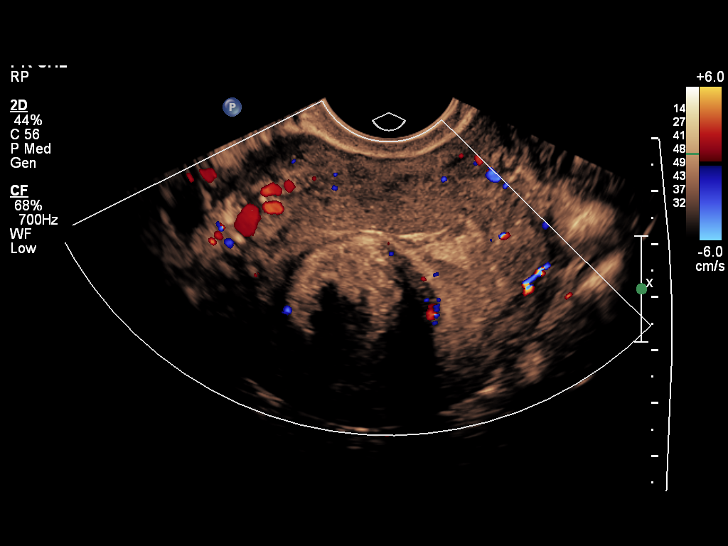
[im 47/80]
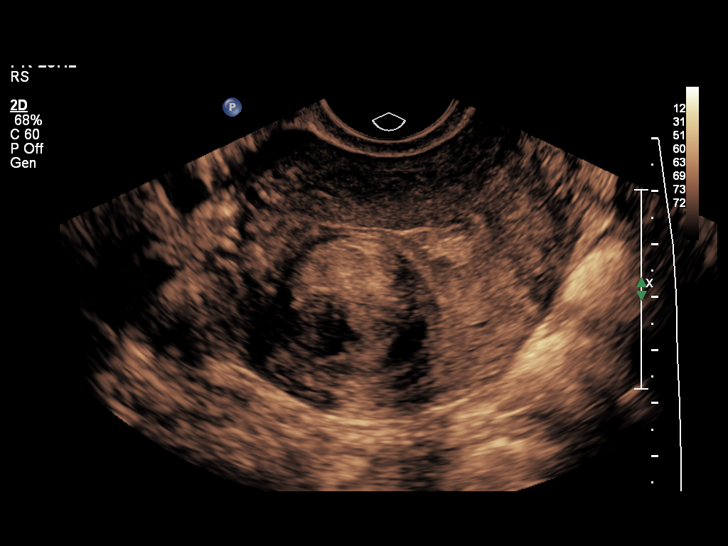
[im 53/80]
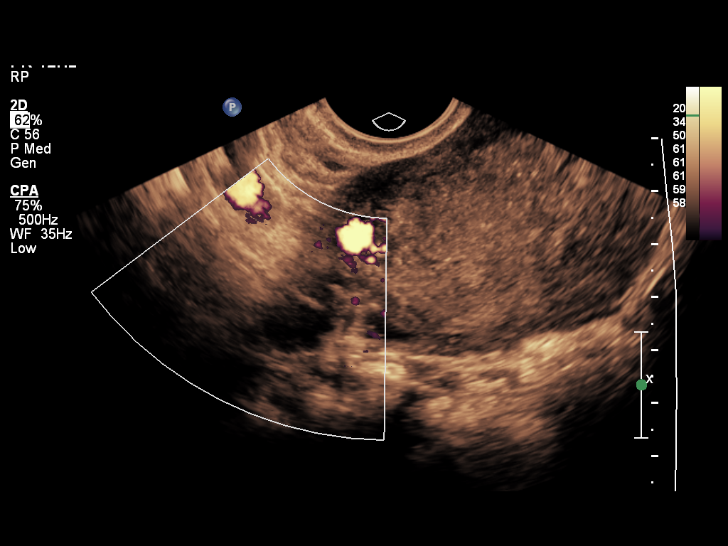
[im 60/80]
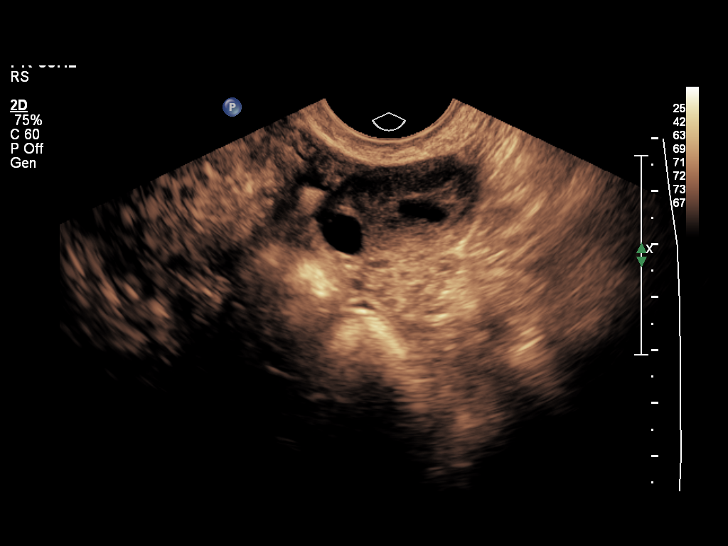
[im 66/80]
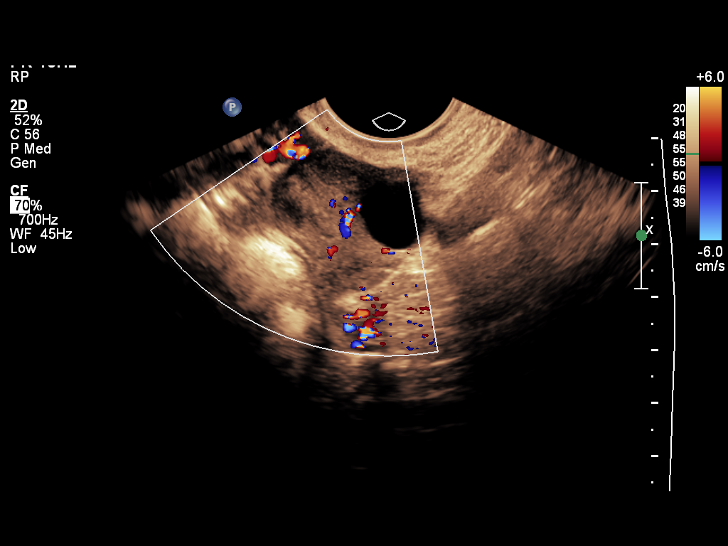
[im 73/80]
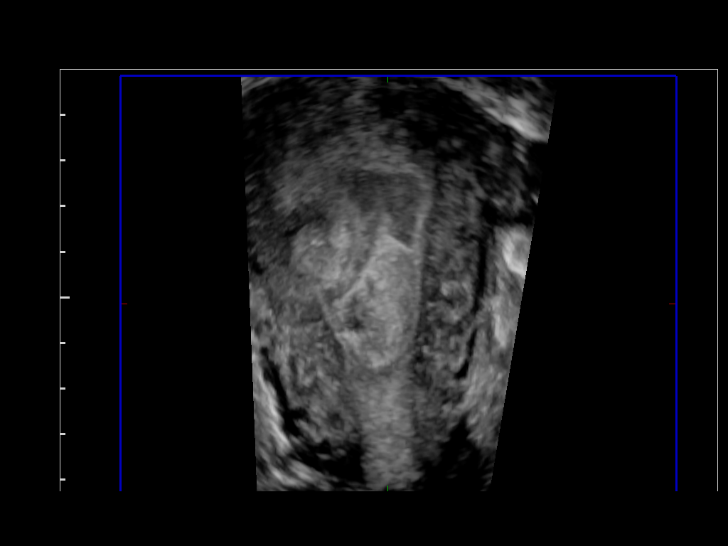
[im 80/80]
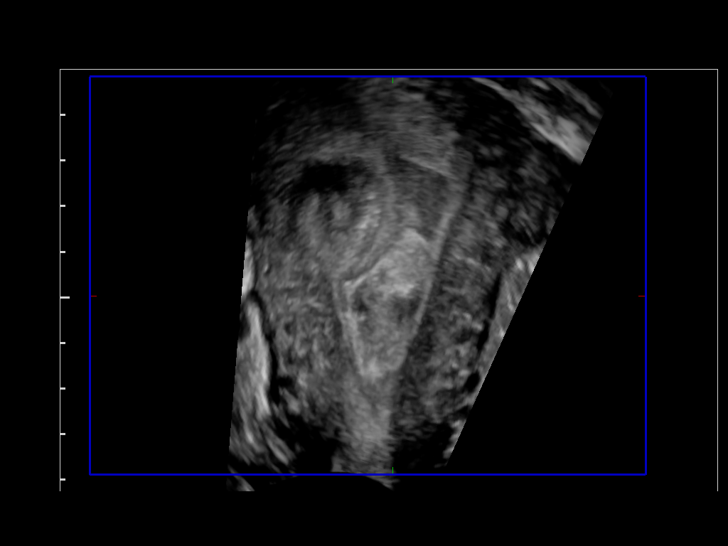

[13 of 25 positions shown; findings below may reference images not displayed]

FINDINGS: Uterus

Measurements: 10.9 x 7.7 x 5.9 cm. Anteverted, anteflexed. Right
lateral uterine body intramural fibroid measures 3.2 x 3.1 x 2.5 cm.

Endometrium

Thickness: 1.0 cm, inhomogeneous in echotexture with mobile internal
echoes likely representing blood or debris. Mild mass effect upon
the endometrial stripe by right lateral uterine fibroid..

Right ovary

Measurements: Ovary is surgically absent. Simple appearing right
paraovarian cyst or lymphangioma measures 1.4 x 1.2 x 0.8 cm..

Left ovary

Measurements: 3.7 x 3.0 x 2.5 cm. Normal appearance/no adnexal mass.

Other findings

No free fluid.
IMPRESSION: Uterine fibroid as above. No acute abnormality or suspicious adnexal
mass identified.

## 2015-11-10 NOTE — Telephone Encounter (Signed)
Pt did not show for their appt with Dr. Dohmeier today.  

## 2015-11-11 ENCOUNTER — Encounter: Payer: Self-pay | Admitting: Neurology

## 2015-12-05 ENCOUNTER — Ambulatory Visit (INDEPENDENT_AMBULATORY_CARE_PROVIDER_SITE_OTHER): Payer: Medicaid Other | Admitting: Neurology

## 2015-12-05 ENCOUNTER — Encounter: Payer: Self-pay | Admitting: Neurology

## 2015-12-05 VITALS — BP 102/78 | HR 100 | Resp 20 | Ht 71.5 in | Wt 201.0 lb

## 2015-12-05 DIAGNOSIS — G4440 Drug-induced headache, not elsewhere classified, not intractable: Secondary | ICD-10-CM | POA: Diagnosis not present

## 2015-12-05 DIAGNOSIS — G43011 Migraine without aura, intractable, with status migrainosus: Secondary | ICD-10-CM | POA: Diagnosis not present

## 2015-12-05 DIAGNOSIS — G44011 Episodic cluster headache, intractable: Secondary | ICD-10-CM | POA: Diagnosis not present

## 2015-12-05 DIAGNOSIS — T3995XA Adverse effect of unspecified nonopioid analgesic, antipyretic and antirheumatic, initial encounter: Secondary | ICD-10-CM

## 2015-12-05 NOTE — Progress Notes (Signed)
SLEEP MEDICINE CLINIC   Provider:  Larey Seat, M D  Referring Provider: Nolene Ebbs, MD Primary Care Physician:  Philis Fendt, MD  Chief Complaint  Patient presents with  . New Patient (Initial Visit)    morning headaches, sometimes snores, never had sleep study    HPI:  Christine Vega is a 45 y.o. female , seen here as a referral  from Dr. Jeanie Cooks for a sleep consultation,  This patient has previously  ( over 3 years ago) been seen at [facility] for migraine, but had a no show 11-10-15.   Christine Vega is referred by her primary cardiologist , Dr. Einar Gip, for sleep evaluation. She has a history of migrainous headaches, but her cardiologist is mainly concerned about nocturnal palpitations, these she has had since October 2014. PVCs and PACs were performed. She has controlled them with propranolol, until May 2017 when she again noted increased palpitations. Palpitations can be related to sleep apnea. She further suffers from a mild, concentric hypertrophy of the left ventricle with a normal ejection fraction at 61%. Left atrial dilation. Very mild mitral regurgitation. She has had intractable migraine without aura and with status migrainosus,  She has iron deficiency anemia and is followed by a hematologist ,  she is taking currently propranolol, verapamil, depakote, sertraline, propafenone, Lyrica. The patient wakes up with headaches in the morning. She has several triggers that can cause a headache including perfumes or other strong smells, chemical smells even body odor and cigarette smoke. She also has identified a tendency to have sinus headaches which can present very similar to migraine headaches, but she feels that she can distinguish them. She suffers form Migraines and clusters. Status migrainosus as they last 3 days.    Sleep habits are as follows:  The patient has recently gone to bed between midnight and 1 AM, sometimes she has trouble falling asleep but most nights she will  fall asleep promptly. Her bedroom is cool, quiet and dark. Most nights she will sleep alone, she sleeps on 2 pillows, she has to sleep on her side because of a coccygeal fracture. She was told she snores, but not frequently, no dream enactment, no choking in sleep, sleep walking , night terrors.  Usually she can sleep through the night until 4 or  5 AM when she has to go and use the bathroom. After her bathroom break she goes back to bed and can initiate sleep again. Usually until 10 or 11 AM. She may sleep up to 8 or 9 hours a night. She wakes up relying on an alarm even after many hours of sleep. Sometimes she may have a dry mouth or headaches as she wakes up. She also describes sharp and stabbing headaches that on occasion wake her out of sleep. She reports vivid dreams. Rarely will she nap in daytime.    Sleep medical history and family sleep history: her children and grandchildren snore.  Social history:  Very remote history of shift work,  Disabled since 2014 , due to headaches.  She avoids.disorder and chocolate because of her migraine headaches, she may drink a soda every other day or less. She also does not daily  drink ice tea and she does not drink coffee- she may consume 1 or 2 caffeine it beverages a week but not daily. No alcohol use, no tobacco use.  Review of Systems: Out of a complete 14 system review, the patient complains of only the following symptoms, and all other reviewed  systems are negative. She feels a depressed over her financial and work situation,  Health situation is stable.  Nothing else endorsed on ROS.  Epworth score 9, Fatigue severity score 54  , depression score 4/15    Social History   Social History  . Marital status: Legally Separated    Spouse name: N/A  . Number of children: N/A  . Years of education: N/A   Occupational History  . Not on file.   Social History Main Topics  . Smoking status: Never Smoker  . Smokeless tobacco: Never Used  . Alcohol  use No  . Drug use: No  . Sexual activity: Not Currently    Birth control/ protection: Surgical   Other Topics Concern  . Not on file   Social History Narrative  . No narrative on file    Family History  Problem Relation Age of Onset  . Heart disease Father     Past Medical History:  Diagnosis Date  . Anemia    history   . Anxiety   . Back pain    rotates to left hip and tailbone  . Depression   . Endometriosis   . Fractured coccyx ([place])   . Heart murmur    no problems  . History of seasonal allergies   . Hyperlipidemia   . Migraine   . Migraines    tx w/depakote/verapamil per pt  . Murmur, heart   . Neuromuscular disorder ([place])    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, walks/balance ok  . Post traumatic stress disorder (PTSD)   . Seizures ([place]) 2004   x 1; unknown source    Past Surgical History:  Procedure Laterality Date  . CHOLECYSTECTOMY    . HERNIA REPAIR    . LAPAROSCOPY  05/16/2011   Procedure: LAPAROSCOPY OPERATIVE;  Surgeon: Emeterio Reeve, MD;  Location: [facility];  Service: Gynecology;  Laterality: N/A;  poss oophorectomy  . LAPAROSCOPY  08/06/2011   Procedure: LAPAROSCOPY OPERATIVE;  Surgeon: Woodroe Mode, MD;  Location: [facility];  Service: Gynecology;  Laterality: N/A;  . LAPAROTOMY     for endometriosis/adhesions  . SALPINGOOPHORECTOMY  08/06/2011   Procedure: SALPINGO OOPHERECTOMY;  Surgeon: Woodroe Mode, MD;  Location: [facility];  Service: Gynecology;  Laterality: Right;  . SVD     x 2  . TUBAL LIGATION    . WISDOM TOOTH EXTRACTION      Current Outpatient Prescriptions  Medication Sig Dispense Refill  . buPROPion (WELLBUTRIN XL) 300 MG 24 hr tablet Take 300 mg by mouth daily as needed (depression).     . cholecalciferol (VITAMIN D) 1000 UNITS tablet Take 1,000 Units by mouth daily.    . diphenhydrAMINE (BENADRYL) 25 MG tablet Take 1 tablet (25 mg total) by mouth every 6 (six) hours. 20 tablet 0  . divalproex (DEPAKOTE) 500 MG 24 hr tablet Take 500 mg by  mouth 2 (two) times daily.      . ferrous sulfate 325 (65 FE) MG tablet Take 325 mg by mouth daily with breakfast.    . fluconazole (DIFLUCAN) 150 MG tablet Take 1 tablet (150 mg total) by mouth once. 1 tablet 0  . fluticasone (FLONASE) 50 MCG/ACT nasal spray Place 2 sprays into the nose daily as needed for allergies (allergies). Each nostril    . hydrocortisone cream 1 % Apply 1 application topically 2 (two) times daily. Do not apply to face (Patient taking differently: Apply 1 application topically 2 (two) times daily as needed  for itching (itching). Do not apply to face) 15 g 1  . ipratropium (ATROVENT) 0.06 % nasal spray Place 2 sprays into both nostrils 4 (four) times daily. (Patient taking differently: Place 2 sprays into both nostrils 4 (four) times daily as needed (allergies). ) 15 mL 1  . loratadine (CLARITIN) 10 MG tablet Take 10 mg by mouth daily as needed for allergies (allergies).     . LORazepam (ATIVAN) 1 MG tablet Take 1.5 mg by mouth every 6 (six) hours as needed for anxiety. For anxiety.    . magnesium oxide (MAGOX 400) 400 (241.3 MG) MG tablet Take 800 mg by mouth.    . metroNIDAZOLE (FLAGYL) 500 MG tablet Take 1 tablet (500 mg total) by mouth 2 (two) times daily. 14 tablet 0  . naproxen (NAPROSYN) 375 MG tablet Take 1 tablet (375 mg total) by mouth 2 (two) times daily. 20 tablet 0  . nortriptyline (PAMELOR) 50 MG capsule Take 100 mg by mouth.    . oxyCODONE (OXY IR/ROXICODONE) 5 MG immediate release tablet Take 5 mg by mouth every 4 (four) hours as needed. For pain    . predniSONE (DELTASONE) 10 MG tablet Take 3 tablets (30 mg total) by mouth daily. 15 tablet 0  . pregabalin (LYRICA) 25 MG capsule Take 25 mg by mouth 3 (three) times daily.    . promethazine (PROMETHEGAN) 50 MG suppository Place 50 mg rectally. Reported on 07/07/2015    . sertraline (ZOLOFT) 100 MG tablet Take 150 mg by mouth daily.    Marland Kitchen tiZANidine (ZANAFLEX) 4 MG tablet Take 4 mg by mouth every 6 (six) hours as  needed for muscle spasms (muscle spasms).     . verapamil (CALAN-SR) 120 MG CR tablet Take 120 mg by mouth at bedtime.    . vitamin E 100 UNIT capsule Take 100 Units by mouth daily.    Marland Kitchen ZOLMitriptan 2.5 MG SOLN Place 1 spray into the nose.     No current facility-administered medications for this visit.     Allergies as of 12/05/2015 - Review Complete 12/05/2015  Allergen Reaction Noted  . Penicillins Hives and Itching 05/16/2014  . Prednisone Hives and Itching 05/16/2014  . Zithromax [azithromycin] Itching 12/01/2010    Vitals: BP 102/78   Pulse 100   Resp 20   Ht 5' 11.5" (1.816 m)   Wt 201 lb (91.2 kg)   BMI 27.64 kg/m  Last Weight:  Wt Readings from Last 1 Encounters:  12/05/15 201 lb (91.2 kg)   [id number] mass index is 27.64 kg/m.     Last Height:   Ht Readings from Last 1 Encounters:  12/05/15 5' 11.5" (1.816 m)    Physical exam:  General: The patient is awake, alert and appears not in acute distress. The patient is well groomed. Head: Normocephalic, atraumatic. Neck is supple. Mallampati 5- invisible uvula   neck circumference:18. Nasal airflow patent , TMJ not evident . Retrognathia is seen.  Cardiovascular:  Regular rate and rhythm, without  murmurs or carotid bruit, and without distended neck veins. Respiratory: Lungs are clear to auscultation. Skin:  Without evidence of edema, or rash Trunk: BMI is 27.65.  The patient's posture ; she leans on her cane.    Neurologic exam : The patient is awake and alert, oriented to place and time.   Memory subjective described as intact.  Attention span & concentration ability appears normal.  Speech is fluent,  without dysarthria, dysphonia or aphasia.  Mood and affect are appropriate.  Cranial nerves: Pupils are equal and briskly reactive to light. Funduscopic exam without evidence of pallor or edema. Extraocular movements  in vertical and horizontal planes intact and without nystagmus. Visual fields by finger  perimetry are intact. Hearing to finger rub intact.   Facial sensation intact to fine touch.  Facial motor strength is symmetric and tongue and uvula move midline. Shoulder shrug was symmetrical.   Motor exam:  Normal tone, muscle bulk and symmetric strength in all extremities. Sensory:  Fine touch, pinprick and vibration were tested in all extremities. Proprioception tested in the upper extremities was normal. Coordination: Rapid alternating movements in the fingers/hands was normal. Finger-to-nose maneuver  normal without evidence of ataxia, dysmetria or tremor. Gait and station: Patient walks without assistive device and is able unassisted to climb up to the exam table. Strength within normal limits.  Stance is stable and normal.   Deep tendon reflexes: in the  upper and lower extremities are symmetric and intact. Babinski maneuver response is downgoing.  The patient was advised of the nature of the diagnosed sleep disorder , the treatment options and risks for general a health and wellness arising from not treating the condition.  I spent more than 45   minutes of face to face time with the patient. Greater than 50% of time was spent in counseling and coordination of care. We have discussed the diagnosis and differential and I answered the patient's questions.     Assessment:  After physical and neurologic examination, review of laboratory studies,  Personal review of imaging studies, reports of other /same  Imaging studies ,  Results of polysomnography/ neurophysiology testing and pre-existing records as far as provided in visit., my assessment is   1) Christine Vega has a history of frequent migraines without aura and with status migrainosus lasting up to 3 days. She has no katamenial pattern but olfactory triggers. In addition she suffers from episodic cluster headaches which can rise out of sleep and wake her up with a sharper almost stabbing sensation usually arising through the eye. Both  headaches can be sleep related either to sleep deprivation but in some cases to sleeping too long. My concern is that she may have obstructive sleep apnea associated with hypercapnia and hypoxemia and that this may lead to both episodic cluster headaches and frequent migraine headaches. For this reason she should be tested in a split-night polysomnography, and attended sleep study in the lab.   Plan:  Treatment plan and additional workup :  Patient will under go Co2 with SPLIT , titrate  O2. if hypoxemia is found,   Rv  after sleep study    Larey Seat MD  12/05/2015   CC: Nolene Ebbs, [address] [place], [place] [id number]

## 2015-12-05 NOTE — Patient Instructions (Signed)
Please remember to try to maintain good sleep hygiene, which means: Keep a regular sleep and wake schedule, try not to exercise or have a meal within 2 hours of your bedtime, try to keep your bedroom conducive for sleep, that is, cool and dark, without light distractors such as an illuminated alarm clock, and refrain from watching TV right before sleep or in the middle of the night and do not keep the TV or radio on during the night. Also, try not to use or play on electronic devices at bedtime, such as your cell phone, tablet PC or laptop. If you like to read at bedtime on an electronic device, try to dim the background light as much as possible. Do not eat in the middle of the night.   We will request a sleep study.    We will look for leg twitching and snoring or sleep apnea.    

## 2015-12-15 ENCOUNTER — Institutional Professional Consult (permissible substitution): Payer: Medicaid Other | Admitting: Obstetrics and Gynecology

## 2015-12-16 ENCOUNTER — Institutional Professional Consult (permissible substitution): Payer: Medicaid Other | Admitting: Obstetrics and Gynecology

## 2015-12-20 ENCOUNTER — Encounter (HOSPITAL_COMMUNITY): Payer: Self-pay | Admitting: Emergency Medicine

## 2015-12-20 ENCOUNTER — Emergency Department (HOSPITAL_COMMUNITY): Payer: Medicaid Other

## 2015-12-20 ENCOUNTER — Emergency Department (HOSPITAL_COMMUNITY)
Admission: EM | Admit: 2015-12-20 | Discharge: 2015-12-20 | Disposition: A | Discharge status: Home / Self Care | Payer: Medicaid Other | Attending: Emergency Medicine | Admitting: Emergency Medicine

## 2015-12-20 DIAGNOSIS — Y929 Unspecified place or not applicable: Secondary | ICD-10-CM | POA: Diagnosis not present

## 2015-12-20 DIAGNOSIS — W19XXXA Unspecified fall, initial encounter: Secondary | ICD-10-CM

## 2015-12-20 DIAGNOSIS — Z79899 Other long term (current) drug therapy: Secondary | ICD-10-CM | POA: Insufficient documentation

## 2015-12-20 DIAGNOSIS — Y9301 Activity, walking, marching and hiking: Secondary | ICD-10-CM | POA: Insufficient documentation

## 2015-12-20 DIAGNOSIS — S39012A Strain of muscle, fascia and tendon of lower back, initial encounter: Secondary | ICD-10-CM | POA: Insufficient documentation

## 2015-12-20 DIAGNOSIS — S43001A Unspecified subluxation of right shoulder joint, initial encounter: Secondary | ICD-10-CM | POA: Diagnosis not present

## 2015-12-20 DIAGNOSIS — S161XXA Strain of muscle, fascia and tendon at neck level, initial encounter: Secondary | ICD-10-CM | POA: Insufficient documentation

## 2015-12-20 DIAGNOSIS — S0990XA Unspecified injury of head, initial encounter: Secondary | ICD-10-CM | POA: Diagnosis present

## 2015-12-20 DIAGNOSIS — Y999 Unspecified external cause status: Secondary | ICD-10-CM | POA: Insufficient documentation

## 2015-12-20 DIAGNOSIS — W0110XA Fall on same level from slipping, tripping and stumbling with subsequent striking against unspecified object, initial encounter: Secondary | ICD-10-CM | POA: Insufficient documentation

## 2015-12-20 DIAGNOSIS — Z7951 Long term (current) use of inhaled steroids: Secondary | ICD-10-CM | POA: Diagnosis not present

## 2015-12-20 MED ORDER — NAPROXEN 500 MG PO TABS: 500.0000 mg | ORAL_TABLET | Freq: Two times a day (BID) | ORAL | 0 refills | Status: DC

## 2015-12-20 NOTE — ED Provider Notes (Signed)
[facility] DEPT Provider Note   CSN: [id number] Arrival date & time: 12/20/15  [id number]     History   Chief Complaint Chief Complaint  Patient presents with  . Fall    neck pain     HPI Christine Vega is a 45 y.o. female.  Patient status post fall yesterday tripped over a mound of dirt near a ditch. Patient fell on her right side. Complain of pain to the right side of her face and head and right neck lower lumbar area and apparently right shoulder partially came out of joint them members were able to get it back in the patient's been hearing kind of a clicking sensation there since. Patient denies any  motor weakness or any numbness. no nausea no vomiting no dizziness. Patient had a previous cervical spine fracture back in 2005. Patient is worried that she reinjured something from the fall yesterday. Patient denies any abdominal pain and shortness of breath.       Past Medical History:  Diagnosis Date  . Anemia    history   . Anxiety   . Back pain    rotates to left hip and tailbone  . Depression   . Endometriosis   . Fractured coccyx ([place])   . Heart murmur    no problems  . History of seasonal allergies   . Hyperlipidemia   . Migraine   . Migraines    tx w/depakote/verapamil per pt  . Murmur, heart   . 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 Post traumatic stress disorder (PTSD)   . Seizures ([place]) 2004   x 1; unknown source    Patient Active Problem List   Diagnosis Date Noted  . 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 Complex ovarian cyst 12/01/2010  . DUB (dysfunctional uterine bleeding) 12/01/2010  . Pain, female pelvic 12/01/2010    Past Surgical History:  Procedure Laterality Date  . CHOLECYSTECTOMY    . HERNIA REPAIR    . LAPAROSCOPY  05/16/2011   Procedure: LAPAROSCOPY OPERATIVE;  Surgeon: Emeterio Reeve, MD;   Location: [facility];  Service: Gynecology;  Laterality: N/A;  poss oophorectomy  . LAPAROSCOPY  08/06/2011   Procedure: LAPAROSCOPY OPERATIVE;  Surgeon: Woodroe Mode, MD;  Location: [facility];  Service: Gynecology;  Laterality: N/A;  . LAPAROTOMY     for endometriosis/adhesions  . SALPINGOOPHORECTOMY  08/06/2011   Procedure: SALPINGO OOPHERECTOMY;  Surgeon: Woodroe Mode, MD;  Location: [facility];  Service: Gynecology;  Laterality: Right;  . SVD     x 2  . TUBAL LIGATION    . WISDOM TOOTH EXTRACTION      OB History    Gravida Para Term Preterm AB Living   2 2 2  0 0 2   SAB TAB Ectopic Multiple Live Births   0 0 0 0         Home Medications    Prior to Admission medications   Medication Sig Start Date End Date Taking? Authorizing Provider  buPROPion (WELLBUTRIN XL) 300 MG 24 hr tablet Take 300 mg by mouth daily.    Yes Historical Provider, MD  cholecalciferol (VITAMIN D) 1000 UNITS tablet Take 1,000 Units by mouth daily.   Yes Historical Provider, MD  diphenhydrAMINE (BENADRYL) 25 MG tablet Take 1 tablet (25 mg total) by mouth every 6 (six) hours. Patient taking differently: Take 25  mg by mouth every 6 (six) hours as needed for allergies.  04/06/14  Yes Harvie Heck, PA-C  divalproex (DEPAKOTE) 500 MG 24 hr tablet Take 500 mg by mouth 2 (two) times daily.     Yes Historical Provider, MD  ferrous sulfate 325 (65 FE) MG tablet Take 325 mg by mouth daily with breakfast.   Yes Historical Provider, MD  fluticasone (FLONASE) 50 MCG/ACT nasal spray Place 2 sprays into the nose every 4 (four) hours as needed for allergies (allergies). Each nostril    Yes Historical Provider, MD  loratadine (CLARITIN) 10 MG tablet Take 10 mg by mouth daily as needed for allergies (allergies).    Yes Historical Provider, MD  naproxen (NAPROSYN) 375 MG tablet Take 1 tablet (375 mg total) by mouth 2 (two) times daily. 08/04/15  Yes Lysbeth Penner, FNP  nortriptyline (PAMELOR) 50 MG capsule Take 100 mg by mouth 2  (two) times daily.  01/27/15  Yes Historical Provider, MD  pregabalin (LYRICA) 25 MG capsule Take 25 mg by mouth 3 (three) times daily.   Yes Historical Provider, MD  promethazine (PROMETHEGAN) 50 MG suppository Place 50 mg rectally every 8 (eight) hours as needed for nausea or vomiting. Reported on 07/07/2015 01/27/15  Yes Historical Provider, MD  sertraline (ZOLOFT) 100 MG tablet Take 150 mg by mouth daily.   Yes Historical Provider, MD  tiZANidine (ZANAFLEX) 4 MG tablet Take 4 mg by mouth every 6 (six) hours as needed for muscle spasms (muscle spasms).    Yes Historical Provider, MD  verapamil (CALAN-SR) 120 MG CR tablet Take 120 mg by mouth daily.    Yes Historical Provider, MD  vitamin E 100 UNIT capsule Take 100 Units by mouth daily.   Yes Historical Provider, MD  ZOLMitriptan 2.5 MG SOLN Place 1 spray into both nostrils daily as needed (migraines, no more thna 2-3 times a week.Marland Kitchen).  01/27/15  Yes Historical Provider, MD  fluconazole (DIFLUCAN) 150 MG tablet Take 1 tablet (150 mg total) by mouth once. Patient not taking: Reported on 12/20/2015 10/27/15   Deirdre C Poe, CNM  hydrocortisone cream 1 % Apply 1 application topically 2 (two) times daily. Do not apply to face Patient not taking: Reported on 12/20/2015 04/06/14   Harvie Heck, PA-C  ipratropium (ATROVENT) 0.06 % nasal spray Place 2 sprays into both nostrils 4 (four) times daily. Patient not taking: Reported on 12/20/2015 03/22/14   Billy Fischer, MD  metroNIDAZOLE (FLAGYL) 500 MG tablet Take 1 tablet (500 mg total) by mouth 2 (two) times daily. Patient not taking: Reported on 12/20/2015 10/27/15   Deirdre C Poe, CNM  naproxen (NAPROSYN) 500 MG tablet Take 1 tablet (500 mg total) by mouth 2 (two) times daily. 12/20/15   Fredia Sorrow, MD  oxyCODONE (OXY IR/ROXICODONE) 5 MG immediate release tablet Take 5 mg by mouth every 4 (four) hours as needed. For pain    Historical Provider, MD  predniSONE (DELTASONE) 10 MG tablet Take 3 tablets (30 mg  total) by mouth daily. Patient not taking: Reported on 12/20/2015 10/31/13   Gregor Hams, MD    Family History Family History  Problem Relation Age of Onset  . Heart disease Father     Social History Social History  Substance Use Topics  . Smoking status: Never Smoker  . Smokeless tobacco: Never Used  . Alcohol use No     Allergies   Penicillins; Prednisone; and Zithromax [azithromycin]   Review of Systems Review of Systems  Constitutional: Negative for fever.  HENT: Negative for congestion.   Eyes: Negative for redness.  Respiratory: Negative for shortness of breath.   Cardiovascular: Negative for chest pain.  Gastrointestinal: Negative for abdominal pain.  Musculoskeletal: Positive for back pain and neck pain.  Skin: Negative for wound.  Neurological: Positive for headaches. Negative for dizziness, weakness and numbness.  Hematological: Does not bruise/bleed easily.  Psychiatric/Behavioral: Negative for confusion.     Physical Exam Updated Vital Signs BP 137/83   Pulse 86   Temp 98.2 F (36.8 C) (Oral)   Resp 16   Ht 5' 11.5" (1.816 m)   Wt 91.2 kg   LMP 11/19/2015   SpO2 99%   BMI 27.64 kg/m   Physical Exam  Constitutional: She is oriented to person, place, and time. She appears well-developed and well-nourished. No distress.  HENT:  Head: Normocephalic and atraumatic.  Mouth/Throat: Oropharynx is clear and moist.  Eyes: EOM are normal. Pupils are equal, round, and reactive to light.  Neck: Normal range of motion. Neck supple.  Cardiovascular: Normal rate, regular rhythm and normal heart sounds.   Pulmonary/Chest: Effort normal and breath sounds normal. No respiratory distress.  Abdominal: Soft. Bowel sounds are normal. There is no tenderness.  Musculoskeletal: She exhibits no deformity.  She was some limited range of motion of the neck with some tenderness to the right paraspinous area. Some midline tenderness to the lumbar area. Good range of motion  of the right shoulder no deformity radial pulse distally is 2+. Hands bilaterally with good cap refill sensation intact. Same for lower extremities. No thoracic back tenderness.  Neurological: She is alert and oriented to person, place, and time. She displays normal reflexes. No cranial nerve deficit. Coordination normal.  Skin: Skin is warm. Capillary refill takes less than 2 seconds.  Nursing note and vitals reviewed.    ED Treatments / Results  Labs (all labs ordered are listed, but only abnormal results are displayed) Labs Reviewed - No data to display  EKG  EKG Interpretation None       Radiology Dg Shoulder Right  Result Date: 12/20/2015 CLINICAL DATA:  Fall. EXAM: RIGHT SHOULDER - 2+ VIEW COMPARISON:  10/31/2013 . FINDINGS: Acromioclavicular and glenohumeral degenerative change. No evidence of fracture or dislocation. IMPRESSION: Degenerative change right shoulder.  No acute abnormality. Electronically Signed   By: Marcello Moores  Register   On: 12/20/2015 10:22   Ct Head Wo Contrast  Result Date: 12/20/2015 CLINICAL DATA:  Status post fall yesterday with with onset of headache and neck pain. Patient reports history of cervical spine fracture in 2005. Initial encounter. EXAM: CT HEAD WITHOUT CONTRAST CT CERVICAL SPINE WITHOUT CONTRAST TECHNIQUE: Multidetector CT imaging of the head and cervical spine was performed following the standard protocol without intravenous contrast. Multiplanar CT image reconstructions of the cervical spine were also generated. COMPARISON:  None. FINDINGS: CT HEAD FINDINGS The brain appears normal without hemorrhage, infarct, mass lesion, mass effect, midline shift or abnormal extra-axial fluid collection. No hydrocephalus or pneumocephalus. The calvarium is intact. Imaged paranasal sinuses and mastoid air cells are clear. CT CERVICAL SPINE FINDINGS No acute or remote cervical spine fracture is identified. Alignment is maintained. Disc bulging with endplate spurring  are seen at C5-6 and C6-7. Facet degenerative disease is noted on the left at C7-T1 and T1-2. Lung apices are clear. IMPRESSION: No acute abnormality head or cervical spine. C5-6 and C6-7 degenerative disc disease. Electronically Signed   By: Inge Rise M.D.   On: 12/20/2015  10:30   Ct Cervical Spine Wo Contrast  Result Date: 12/20/2015 CLINICAL DATA:  Status post fall yesterday with with onset of headache and neck pain. Patient reports history of cervical spine fracture in 2005. Initial encounter. EXAM: CT HEAD WITHOUT CONTRAST CT CERVICAL SPINE WITHOUT CONTRAST TECHNIQUE: Multidetector CT imaging of the head and cervical spine was performed following the standard protocol without intravenous contrast. Multiplanar CT image reconstructions of the cervical spine were also generated. COMPARISON:  None. FINDINGS: CT HEAD FINDINGS The brain appears normal without hemorrhage, infarct, mass lesion, mass effect, midline shift or abnormal extra-axial fluid collection. No hydrocephalus or pneumocephalus. The calvarium is intact. Imaged paranasal sinuses and mastoid air cells are clear. CT CERVICAL SPINE FINDINGS No acute or remote cervical spine fracture is identified. Alignment is maintained. Disc bulging with endplate spurring are seen at C5-6 and C6-7. Facet degenerative disease is noted on the left at C7-T1 and T1-2. Lung apices are clear. IMPRESSION: No acute abnormality head or cervical spine. C5-6 and C6-7 degenerative disc disease. Electronically Signed   By: Inge Rise M.D.   On: 12/20/2015 10:30   Ct Lumbar Spine Wo Contrast  Result Date: 12/20/2015 CLINICAL DATA:  Fall yesterday.  Pain all over. EXAM: CT LUMBAR SPINE WITHOUT CONTRAST TECHNIQUE: Multidetector CT imaging of the lumbar spine was performed without intravenous contrast administration. Multiplanar CT image reconstructions were also generated. COMPARISON:  None. FINDINGS: There is normal alignment. No fracture. Degenerative facet  disease throughout the lumbar spine, most pronounced from L3-4 through L5-S1. Disc spaces are maintained. Early anterior spurring throughout the lumbar spine. IMPRESSION: No acute bony abnormality. Degenerative facet disease. Electronically Signed   By: Rolm Baptise M.D.   On: 12/20/2015 10:19    Procedures Procedures (including critical care time)  Medications Ordered in ED Medications - No data to display   Initial Impression / Assessment and Plan / ED Course  I have reviewed the triage vital signs and the nursing notes.  Pertinent labs & imaging results that were available during my care of the patient were reviewed by me and considered in my medical decision making (see chart for details).  Clinical Course   Patient with a fall fall yesterday. Patient fell walking outside and tripped over a mound of dirt. Landed on the right side of her head face complaining of right neck pain low back pain and right shoulder apparently came partially other joint which is happen before and family members got it back in. Patient has a previous cervical fracture in 2005.  Workup here today CT head neck lumbar area without any acute bony findings. There is some evidence of some degenerative changes. Lane x-ray of the right shoulder shows no dislocation no fractures. There is degenerative changes there as well.  Patient will be treated with Naprosyn and follow-up with her primary care doctor. Work note provided.   Final Clinical Impressions(s) / ED Diagnoses   Final diagnoses:  Fall, initial encounter  Minor head injury, initial encounter  Cervical strain, acute, initial encounter  Lumbar strain, initial encounter  Shoulder subluxation, right, initial encounter    New Prescriptions New Prescriptions   NAPROXEN (NAPROSYN) 500 MG TABLET    Take 1 tablet (500 mg total) by mouth 2 (two) times daily.     Fredia Sorrow, MD 12/20/15 1059

## 2015-12-20 NOTE — ED Triage Notes (Signed)
Pt reports fall yesterday while playing, landed forward , hit face on right side. Co neck pain and pain all over per pt. denies loc . Alert and oriented x 4. St states cervical injury with positive fracture in  2005.

## 2015-12-20 NOTE — ED Notes (Signed)
Bed: WA03 Expected date:  Expected time:  Means of arrival:  Comments: 

## 2015-12-20 NOTE — Discharge Instructions (Addendum)
Workup to include CT of head neck and lumbar spine without any acute bony injuries. X-ray of the right shoulder without any evidence of dislocation or fracture. Recommend taking the Naprosyn as directed. May component follow-up with her regular doctor as needed. Return for any new or worse symptoms.

## 2015-12-28 ENCOUNTER — Emergency Department (HOSPITAL_COMMUNITY): Payer: Medicaid Other

## 2015-12-28 ENCOUNTER — Emergency Department (HOSPITAL_COMMUNITY)
Admission: EM | Admit: 2015-12-28 | Discharge: 2015-12-28 | Disposition: A | Discharge status: Home / Self Care | Payer: Medicaid Other | Attending: Emergency Medicine | Admitting: Emergency Medicine

## 2015-12-28 ENCOUNTER — Encounter (HOSPITAL_COMMUNITY): Payer: Self-pay

## 2015-12-28 DIAGNOSIS — Y92410 Unspecified street and highway as the place of occurrence of the external cause: Secondary | ICD-10-CM | POA: Diagnosis not present

## 2015-12-28 DIAGNOSIS — Y9389 Activity, other specified: Secondary | ICD-10-CM | POA: Insufficient documentation

## 2015-12-28 DIAGNOSIS — M7918 Myalgia, other site: Secondary | ICD-10-CM

## 2015-12-28 DIAGNOSIS — M542 Cervicalgia: Secondary | ICD-10-CM | POA: Insufficient documentation

## 2015-12-28 DIAGNOSIS — Y999 Unspecified external cause status: Secondary | ICD-10-CM | POA: Insufficient documentation

## 2015-12-28 DIAGNOSIS — M25561 Pain in right knee: Secondary | ICD-10-CM | POA: Diagnosis present

## 2015-12-28 DIAGNOSIS — M545 Low back pain: Secondary | ICD-10-CM | POA: Diagnosis not present

## 2015-12-28 MED ORDER — IBUPROFEN 600 MG PO TABS: 600.0000 mg | ORAL_TABLET | Freq: Three times a day (TID) | ORAL | 0 refills | Status: DC | PRN

## 2015-12-28 MED ORDER — CYCLOBENZAPRINE HCL 10 MG PO TABS: 10.0000 mg | ORAL_TABLET | Freq: Three times a day (TID) | ORAL | 0 refills | Status: DC | PRN
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
## 2015-12-28 NOTE — ED Notes (Signed)
Patient transported to X-ray 
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
## 2015-12-28 NOTE — Discharge Instructions (Signed)
Read the information below.  Use the prescribed medication as directed.  Please discuss all new medications with your pharmacist.  You may return to the Emergency Department at any time for worsening condition or any new symptoms that concern you.    °

## 2016-01-04 ENCOUNTER — Ambulatory Visit: Payer: Medicaid Other | Admitting: Neurology

## 2016-01-09 ENCOUNTER — Institutional Professional Consult (permissible substitution): Payer: Medicaid Other | Admitting: Obstetrics and Gynecology

## 2016-01-16 ENCOUNTER — Ambulatory Visit (INDEPENDENT_AMBULATORY_CARE_PROVIDER_SITE_OTHER): Payer: Medicaid Other | Admitting: Obstetrics and Gynecology

## 2016-01-16 ENCOUNTER — Encounter: Payer: Self-pay | Admitting: Obstetrics and Gynecology

## 2016-01-16 VITALS — BP 111/72 | HR 88 | Resp 18 | Wt 198.2 lb

## 2016-01-16 DIAGNOSIS — N938 Other specified abnormal uterine and vaginal bleeding: Secondary | ICD-10-CM | POA: Diagnosis not present

## 2016-01-16 NOTE — Progress Notes (Signed)
45 yo G2P2 here for follow up on fibroid uterus and desire for hysterectomy. She states that she continues to experience dysmenorrhea but her flow has worsened. She has a 7 day period with vaginal bleeding in between. Her flow is heavy with passage of large clots. She is anemic has been prescribed higher doses of iron. She denies chest pain, SOB, lightheadedness/dizziness. Patient was initially interested in [place] but failed to follow up  Past Medical History:  Diagnosis Date  . Anemia    history   . Anxiety   . Back pain    rotates to left hip and tailbone  . Depression   . Endometriosis   . Fractured coccyx ([place])   . Heart murmur    no problems  . History of seasonal allergies   . Hyperlipidemia   . Migraine   . Migraines    tx w/depakote/verapamil per pt  . Murmur, heart   . Neuromuscular disorder ([place])    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, walks/balance ok  . Post traumatic stress disorder (PTSD)   . Seizures ([place]) 2004   x 1; unknown source   Past Surgical History:  Procedure Laterality Date  . CHOLECYSTECTOMY    . HERNIA REPAIR    . LAPAROSCOPY  05/16/2011   Procedure: LAPAROSCOPY OPERATIVE;  Surgeon: Emeterio Reeve, MD;  Location: [facility];  Service: Gynecology;  Laterality: N/A;  poss oophorectomy  . LAPAROSCOPY  08/06/2011   Procedure: LAPAROSCOPY OPERATIVE;  Surgeon: Woodroe Mode, MD;  Location: [facility];  Service: Gynecology;  Laterality: N/A;  . LAPAROTOMY     for endometriosis/adhesions  . SALPINGOOPHORECTOMY  08/06/2011   Procedure: SALPINGO OOPHERECTOMY;  Surgeon: Woodroe Mode, MD;  Location: [facility];  Service: Gynecology;  Laterality: Right;  . SVD     x 2  . TUBAL LIGATION    . WISDOM TOOTH EXTRACTION     Family History  Problem Relation Age of Onset  . Heart disease Father    Social History  Substance Use Topics  . Smoking status: Never Smoker  . Smokeless tobacco: Never Used  . Alcohol use No   ROS See pertinent in HPI  Blood pressure 111/72, pulse 88, resp.  rate 18, weight 198 lb 3.2 oz (89.9 kg), last menstrual period 12/20/2015. GENERAL: Well-developed, well-nourished female in no acute distress.  ABDOMEN: Soft, nontender, nondistended. No organomegaly. PELVIC: Normal external female genitalia. Vagina is pink and rugated.  Normal discharge. Normal appearing cervix with active vaginal bleeding and blood clot in vault. Uterus is 12-weeks in size. No adnexal mass or tenderness. EXTREMITIES: No cyanosis, clubbing, or edema, 2+ distal pulses.  A/P 45 yo with DUB and fibroid uterus - Discussed surgical management with endometrial ablation and hysterectomy. Patient strongly desires hysterectomy. Risks, benefits and alternatives were explained including but not limited to risks of bleeding, infection and damage to adjacent organs. Patient verbalized understanding and all questions were answered. Based on patient's last surgery in 2013, dense adhesions were noted. Discussed abdominal approach for this 12-week size uterus - Endometrial biopsy performed ENDOMETRIAL BIOPSY     The indications for endometrial biopsy were reviewed.   Risks of the biopsy including cramping, bleeding, infection, uterine perforation, inadequate specimen and need for additional procedures  were discussed. The patient states she understands and agrees to undergo procedure today. Consent was signed. Time out was performed. Urine HCG was negative. A sterile speculum was placed in the patient's vagina and the cervix was prepped with Betadine. A single-toothed  tenaculum was placed on the anterior lip of the cervix to stabilize it. The uterine cavity was sounded to a depth of 12 cm using the uterine sound. The 3 mm pipelle was introduced into the endometrial cavity without difficulty, 2 passes were made.  A  moderate amount of tissue was  sent to pathology. The instruments were removed from the patient's vagina. Minimal bleeding from the cervix was noted. The patient tolerated the procedure well.   Routine post-procedure instructions were given to the patient. The patient will follow up in two weeks to review the results and for further management.  - Patient will be scheduled for TAH with bilateral salpingectomy pending biopsy results

## 2016-01-17 ENCOUNTER — Encounter: Payer: Self-pay | Admitting: *Deleted

## 2016-01-20 ENCOUNTER — Encounter (HOSPITAL_COMMUNITY): Payer: Self-pay | Admitting: *Deleted

## 2016-01-20 ENCOUNTER — Telehealth: Payer: Self-pay | Admitting: *Deleted

## 2016-01-20 NOTE — Telephone Encounter (Signed)
Pt called requesting results of bx.  Informed pt of result and is awaiting scheduling for surgery.

## 2016-01-31 ENCOUNTER — Encounter (HOSPITAL_COMMUNITY)
Admission: RE | Admit: 2016-01-31 | Discharge: 2016-01-31 | Disposition: A | Discharge status: Home / Self Care | Payer: Medicaid Other | Source: Ambulatory Visit | Attending: Obstetrics and Gynecology | Admitting: Obstetrics and Gynecology

## 2016-01-31 ENCOUNTER — Inpatient Hospital Stay (HOSPITAL_COMMUNITY): Admission: RE | Admit: 2016-01-31 | Payer: Medicaid Other | Source: Ambulatory Visit

## 2016-01-31 ENCOUNTER — Encounter (HOSPITAL_COMMUNITY): Payer: Self-pay

## 2016-01-31 ENCOUNTER — Other Ambulatory Visit: Payer: Self-pay | Admitting: Obstetrics & Gynecology

## 2016-01-31 DIAGNOSIS — Z01812 Encounter for preprocedural laboratory examination: Secondary | ICD-10-CM | POA: Diagnosis not present

## 2016-01-31 LAB — TYPE AND SCREEN
ABO/RH(D): O POS
Antibody Screen: NEGATIVE

## 2016-01-31 LAB — CBC
HCT: 38.7 % (ref 36.0–46.0)
Hemoglobin: 12.9 g/dL (ref 12.0–15.0)
MCH: 29.6 pg (ref 26.0–34.0)
MCHC: 33.3 g/dL (ref 30.0–36.0)
MCV: 88.8 fL (ref 78.0–100.0)
Platelets: 338 K/uL (ref 150–400)
RBC: 4.36 MIL/uL (ref 3.87–5.11)
RDW: 14.5 % (ref 11.5–15.5)
WBC: 6.3 K/uL (ref 4.0–10.5)

## 2016-01-31 NOTE — Patient Instructions (Addendum)
Your procedure is scheduled on:02/07/16  Enter through the Main Entrance at : 7:30 am Pick up desk phone and dial [phone] and inform us of your arrival.  Please call [phone] if you have any problems the morning of surgery.  Remember: Do not eat food or drink liquids, including water, after midnight:Monday   You may brush your teeth the morning of surgery.  Take these meds the morning of surgery with a sip of water: Depakote, Flonase, Claritin, Nortriptyline, Sertraline, Verapamil  DO NOT wear jewelry, eye make-up, lipstick,body lotion, or dark fingernail polish.  (Polished toes are ok) You may wear deodorant.  If you are to be admitted after surgery, leave suitcase in car until your room has been assigned.  Wear loose fitting, comfortable clothes for your ride home.

## 2016-02-06 NOTE — H&P (Signed)
Christine Vega is an 45 y.o. female G2P2 presenting today for scheduled hysterectomy. Patient with a history of symptomatic fibroid uterus and is now ready for definitive surgical treatment. Patient describes a monthly 7-day period which is heavy in flow, with passage of large clots. She states her flow is so heavy she is often fearful of leaving her home as it often soils her clothes. She also reports some dysmenorrhea which has worsened over the years. She denies CP, SOB, lightheadedness/dizziness  Pertinent Gynecological History: Menses: flow is excessive with use of 10 pads or tampons on heaviest days Bleeding: dysfunctional uterine bleeding Contraception: tubal ligation DES exposure: denies Blood transfusions: none Sexually transmitted diseases: no past history Previous GYN Procedures: [place]  Last mammogram: normal Date: 06/2015 Last pap: normal Date: 03/2015 OB History: G2, P2   Menstrual History: Patient's last menstrual period was 01/13/2016 (exact date).    Past Medical History:  Diagnosis Date  . Anemia    history   . Anxiety   . Back pain    rotates to left hip and tailbone  . Depression   . Endometriosis   . Fractured coccyx ([place])   . Heart murmur    no problems  . History of seasonal allergies   . Hyperlipidemia   . Migraine   . Migraines    tx w/depakote/verapamil per pt  . Murmur, heart   . 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 Post traumatic stress disorder (PTSD)   . Seizures ([place]) 2004   x 1; unknown source    Past Surgical History:  Procedure Laterality Date  . CHOLECYSTECTOMY    . HERNIA REPAIR    . LAPAROSCOPY  05/16/2011   Procedure: LAPAROSCOPY OPERATIVE;  Surgeon: Emeterio Reeve, MD;  Location: [facility];  Service: Gynecology;  Laterality: N/A;  poss oophorectomy  . LAPAROSCOPY  08/06/2011   Procedure: LAPAROSCOPY OPERATIVE;  Surgeon: Woodroe Mode, MD;  Location: [facility];  Service: Gynecology;  Laterality: N/A;   . LAPAROTOMY     for endometriosis/adhesions  . SALPINGOOPHORECTOMY  08/06/2011   Procedure: SALPINGO OOPHERECTOMY;  Surgeon: Woodroe Mode, MD;  Location: [facility];  Service: Gynecology;  Laterality: Right;  . SVD     x 2  . TUBAL LIGATION    . WISDOM TOOTH EXTRACTION      Family History  Problem Relation Age of Onset  . Heart disease Father     Social History:  reports that she has never smoked. She has never used smokeless tobacco. She reports that she does not drink alcohol or use drugs.  Allergies:  Allergies  Allergen Reactions  . Penicillins Hives and Itching    Has patient had a PCN reaction causing immediate rash, facial/tongue/throat swelling, SOB or lightheadedness with hypotension: no Has patient had a PCN reaction causing severe rash involving mucus membranes or skin necrosis: unknown Has patient had a PCN reaction that required hospitalization: no Has patient had a PCN reaction occurring within the last 10 years: no If all of the above answers are "NO", then may proceed with Cephalosporin use.   . Prednisone Hives and Itching  . Zithromax [Azithromycin] Itching    Prescriptions Prior to Admission  Medication Sig Dispense Refill Last Dose  . buPROPion (WELLBUTRIN XL) 300 MG 24 hr tablet Take 300 mg by mouth daily.    02/06/2016 at Unknown time  . cholecalciferol (VITAMIN D) 1000 UNITS tablet Take 1,000 Units by mouth daily.  02/06/2016 at Unknown time  . cyclobenzaprine (FLEXERIL) 10 MG tablet Take 1 tablet (10 mg total) by mouth 3 (three) times daily as needed for muscle spasms (and pain). 15 tablet 0 Past Week at Unknown time  . divalproex (DEPAKOTE) 500 MG 24 hr tablet Take 500 mg by mouth 2 (two) times daily.     02/06/2016 at Unknown time  . ferrous sulfate 325 (65 FE) MG tablet Take 325 mg by mouth daily with breakfast.   02/06/2016 at Unknown time  . fluconazole (DIFLUCAN) 150 MG tablet Take 1 tablet (150 mg total) by mouth once. 1 tablet 0 Past Week at  Unknown time  . fluticasone (FLONASE) 50 MCG/ACT nasal spray Place 2 sprays into the nose every 4 (four) hours as needed for allergies (allergies). Each nostril    02/06/2016 at Unknown time  . hydrocortisone cream 1 % Apply 1 application topically 2 (two) times daily. Do not apply to face 15 g 1 02/06/2016 at Unknown time  . ipratropium (ATROVENT) 0.06 % nasal spray Place 2 sprays into both nostrils 4 (four) times daily. 15 mL 1 02/06/2016 at Unknown time  . loratadine (CLARITIN) 10 MG tablet Take 10 mg by mouth daily as needed for allergies (allergies).    02/06/2016 at Unknown time  . naproxen (NAPROSYN) 500 MG tablet Take 1 tablet (500 mg total) by mouth 2 (two) times daily. 14 tablet 0 Past Week at Unknown time  . nortriptyline (PAMELOR) 50 MG capsule Take 100 mg by mouth 2 (two) times daily.    02/06/2016 at Unknown time  . promethazine (PROMETHEGAN) 50 MG suppository Place 50 mg rectally every 8 (eight) hours as needed for nausea or vomiting. Reported on 07/07/2015   Past Week at Unknown time  . sertraline (ZOLOFT) 100 MG tablet Take 150 mg by mouth daily.   02/06/2016 at Unknown time  . tiZANidine (ZANAFLEX) 4 MG tablet Take 4 mg by mouth every 6 (six) hours as needed for muscle spasms (muscle spasms).    02/06/2016 at Unknown time  . verapamil (CALAN-SR) 120 MG CR tablet Take 120 mg by mouth daily.    02/06/2016 at Unknown time  . vitamin E 100 UNIT capsule Take 100 Units by mouth daily.   02/06/2016 at Unknown time  . ZOLMitriptan 2.5 MG SOLN Place 1 spray into both nostrils daily as needed (migraines, no more thna 2-3 times a week.Marland Kitchen).    02/06/2016 at Unknown time  . fluconazole (DIFLUCAN) 150 MG tablet TAKE 1 TABLET BY MOUTH ONCE; MAY TAKE ADDITIONAL DOSE 3 DAYS LATER IF SYMPTOMS PERSIST 1 tablet 2   . oxyCODONE (OXY IR/ROXICODONE) 5 MG immediate release tablet Take 5 mg by mouth every 4 (four) hours as needed. For pain   Taking    ROS See pertinent in HPI Blood pressure (!) 136/91,  pulse 96, temperature 98.1 F (36.7 C), temperature source Oral, resp. rate (!) 22, last menstrual period 01/13/2016, SpO2 100 %. Physical Exam GENERAL: Well-developed, well-nourished female in no acute distress.  HEENT: Normocephalic, atraumatic. Sclerae anicteric.  NECK: Supple. Normal thyroid.  LUNGS: Clear to auscultation bilaterally.  HEART: Regular rate and rhythm. ABDOMEN: Soft, nontender, nondistended.  PELVIC: Normal external female genitalia. Vagina is pink and rugated.  Normal discharge. Normal appearing cervix. Uterus is 12-weeks in size. No adnexal mass or tenderness. EXTREMITIES: No cyanosis, clubbing, or edema, 2+ distal pulses.  No results found for this or any previous visit (from the past 24 hour(s)).  No results found. 01/16/2016  Endometrial biopsy Negative for hyperplasia or malignancy  Assessment/Plan: 45 yo with symptomatic fibroid uterus here for definitive treatment - Risks, benefits and alternatives were reviewed with the patient, including but not limited to risks of bleeding, infection and damage to adjacent organs. Patient verbalized understanding. Patient with history of several laparoscopies with the last one mentioning pelvic adhesions involving omentum and bowel. Patient with a history of endometriosis and a RSO. Patient consented for abdominal hysterectomy with bilateral salpingectomy.   Khyan Oats 02/07/2016, 8:28 AM

## 2016-02-07 ENCOUNTER — Inpatient Hospital Stay (HOSPITAL_COMMUNITY)
Admission: RE | Admit: 2016-02-07 | Discharge: 2016-02-09 | DRG: 743 | Disposition: A | Discharge status: Home / Self Care | Payer: Medicaid Other | Source: Ambulatory Visit | Attending: Obstetrics and Gynecology | Admitting: Obstetrics and Gynecology

## 2016-02-07 ENCOUNTER — Inpatient Hospital Stay (HOSPITAL_COMMUNITY): Payer: Medicaid Other | Admitting: Anesthesiology

## 2016-02-07 ENCOUNTER — Encounter (HOSPITAL_COMMUNITY): Payer: Self-pay

## 2016-02-07 ENCOUNTER — Encounter (HOSPITAL_COMMUNITY)
Admission: RE | Disposition: A | Discharge status: Home / Self Care | Payer: Self-pay | Source: Ambulatory Visit | Attending: Obstetrics and Gynecology

## 2016-02-07 DIAGNOSIS — N920 Excessive and frequent menstruation with regular cycle: Secondary | ICD-10-CM | POA: Diagnosis present

## 2016-02-07 DIAGNOSIS — Z90710 Acquired absence of both cervix and uterus: Secondary | ICD-10-CM | POA: Diagnosis present

## 2016-02-07 DIAGNOSIS — F418 Other specified anxiety disorders: Secondary | ICD-10-CM | POA: Diagnosis present

## 2016-02-07 DIAGNOSIS — N938 Other specified abnormal uterine and vaginal bleeding: Secondary | ICD-10-CM

## 2016-02-07 DIAGNOSIS — I10 Essential (primary) hypertension: Secondary | ICD-10-CM | POA: Diagnosis present

## 2016-02-07 DIAGNOSIS — D649 Anemia, unspecified: Secondary | ICD-10-CM | POA: Diagnosis present

## 2016-02-07 DIAGNOSIS — D251 Intramural leiomyoma of uterus: Secondary | ICD-10-CM | POA: Diagnosis present

## 2016-02-07 DIAGNOSIS — N946 Dysmenorrhea, unspecified: Secondary | ICD-10-CM | POA: Diagnosis present

## 2016-02-07 DIAGNOSIS — D259 Leiomyoma of uterus, unspecified: Secondary | ICD-10-CM | POA: Diagnosis not present

## 2016-02-07 HISTORY — PX: HYSTERECTOMY ABDOMINAL WITH SALPINGECTOMY: SHX6725

## 2016-02-07 SURGERY — HYSTERECTOMY, TOTAL, ABDOMINAL, WITH SALPINGECTOMY
Anesthesia: General | Site: Abdomen | Laterality: Left

## 2016-02-07 MED ORDER — LACTATED RINGERS IV SOLN: INTRAVENOUS | Status: DC

## 2016-02-07 MED ORDER — DIVALPROEX SODIUM 500 MG PO DR TAB
1000.0000 mg | DELAYED_RELEASE_TABLET | Freq: Two times a day (BID) | ORAL | Status: DC
  Filled 2016-02-07 (×6): qty 2

## 2016-02-07 MED ORDER — SODIUM CHLORIDE 0.9 % IJ SOLN
INTRAMUSCULAR | Status: AC
  Filled 2016-02-07: qty 100

## 2016-02-07 MED ORDER — BUPROPION HCL ER (XL) 150 MG PO TB24
150.0000 mg | ORAL_TABLET | Freq: Every day | ORAL | Status: DC
  Filled 2016-02-07 (×4): qty 1

## 2016-02-07 MED ORDER — ONDANSETRON HCL 4 MG/2ML IJ SOLN
4.0000 mg | Freq: Four times a day (QID) | INTRAMUSCULAR | Status: DC | PRN
  Administered 2016-02-07 (×2): 4 mg via INTRAVENOUS
  Filled 2016-02-07 (×2): qty 2

## 2016-02-07 MED ORDER — BUPIVACAINE HCL (PF) 0.25 % IJ SOLN
INTRAMUSCULAR | Status: AC
  Filled 2016-02-07: qty 30

## 2016-02-07 MED ORDER — LIDOCAINE HCL (CARDIAC) 20 MG/ML IV SOLN
INTRAVENOUS | Status: AC
  Filled 2016-02-07: qty 5

## 2016-02-07 MED ORDER — ONDANSETRON HCL 4 MG/2ML IJ SOLN
INTRAMUSCULAR | Status: AC
  Filled 2016-02-07: qty 2

## 2016-02-07 MED ORDER — MIDAZOLAM HCL 2 MG/2ML IJ SOLN
INTRAMUSCULAR | Status: AC
  Filled 2016-02-07: qty 2

## 2016-02-07 MED ORDER — DEXAMETHASONE SODIUM PHOSPHATE 4 MG/ML IJ SOLN
INTRAMUSCULAR | Status: AC
  Filled 2016-02-07: qty 1

## 2016-02-07 MED ORDER — PROMETHAZINE HCL 25 MG/ML IJ SOLN
25.0000 mg | INTRAMUSCULAR | Status: DC | PRN
  Administered 2016-02-07: 25 mg via INTRAVENOUS
  Filled 2016-02-07 (×2): qty 1

## 2016-02-07 MED ORDER — VASOPRESSIN 20 UNIT/ML IV SOLN
INTRAVENOUS | Status: AC
  Filled 2016-02-07: qty 1

## 2016-02-07 MED ORDER — ONDANSETRON HCL 4 MG/2ML IJ SOLN
INTRAMUSCULAR | Status: DC | PRN
  Administered 2016-02-07: 4 mg via INTRAVENOUS

## 2016-02-07 MED ORDER — HYDROMORPHONE HCL 1 MG/ML IJ SOLN
0.2500 mg | INTRAMUSCULAR | Status: DC | PRN
  Administered 2016-02-07 (×2): 0.5 mg via INTRAVENOUS

## 2016-02-07 MED ORDER — BUPROPION HCL ER (XL) 300 MG PO TB24
300.0000 mg | ORAL_TABLET | Freq: Every day | ORAL | Status: DC
  Filled 2016-02-07 (×2): qty 1

## 2016-02-07 MED ORDER — SCOPOLAMINE 1 MG/3DAYS TD PT72
MEDICATED_PATCH | TRANSDERMAL | Status: AC
  Administered 2016-02-07: 1.5 mg via TRANSDERMAL
  Filled 2016-02-07: qty 1

## 2016-02-07 MED ORDER — SERTRALINE HCL 50 MG PO TABS
150.0000 mg | ORAL_TABLET | Freq: Every day | ORAL | Status: DC
  Filled 2016-02-07 (×4): qty 1

## 2016-02-07 MED ORDER — ROCURONIUM BROMIDE 100 MG/10ML IV SOLN
INTRAVENOUS | Status: DC | PRN
  Administered 2016-02-07: 50 mg via INTRAVENOUS

## 2016-02-07 MED ORDER — HYDROMORPHONE 1 MG/ML IV SOLN
INTRAVENOUS | Status: DC
  Administered 2016-02-07: 12:00:00 via INTRAVENOUS
  Administered 2016-02-07 – 2016-02-08 (×2): 0 mL via INTRAVENOUS
  Administered 2016-02-08: 0.3 mg via INTRAVENOUS
  Filled 2016-02-07: qty 25

## 2016-02-07 MED ORDER — INFLUENZA VAC SPLIT QUAD 0.5 ML IM SUSY
0.5000 mL | PREFILLED_SYRINGE | INTRAMUSCULAR | Status: DC
  Filled 2016-02-07: qty 0.5

## 2016-02-07 MED ORDER — ACETAMINOPHEN 10 MG/ML IV SOLN
1000.0000 mg | Freq: Once | INTRAVENOUS | Status: DC
Start: 2016-02-07 — End: 2016-02-07
  Filled 2016-02-07: qty 100

## 2016-02-07 MED ORDER — FENTANYL CITRATE (PF) 250 MCG/5ML IJ SOLN
INTRAMUSCULAR | Status: AC
  Filled 2016-02-07: qty 5

## 2016-02-07 MED ORDER — LACTATED RINGERS IV SOLN
INTRAVENOUS | Status: DC
  Administered 2016-02-07 (×2): via INTRAVENOUS

## 2016-02-07 MED ORDER — SODIUM CHLORIDE 0.9% FLUSH
9.0000 mL | INTRAVENOUS | Status: DC | PRN
Start: 2016-02-07 — End: 2016-02-08

## 2016-02-07 MED ORDER — HYDROMORPHONE HCL 1 MG/ML IJ SOLN
INTRAMUSCULAR | Status: AC
  Filled 2016-02-07: qty 1

## 2016-02-07 MED ORDER — PROMETHAZINE HCL 25 MG/ML IJ SOLN: 6.2500 mg | INTRAMUSCULAR | Status: DC | PRN

## 2016-02-07 MED ORDER — FENTANYL CITRATE (PF) 100 MCG/2ML IJ SOLN
INTRAMUSCULAR | Status: DC | PRN
  Administered 2016-02-07: 25 ug via INTRAVENOUS
  Administered 2016-02-07 (×3): 50 ug via INTRAVENOUS
  Administered 2016-02-07: 25 ug via INTRAVENOUS

## 2016-02-07 MED ORDER — PROPOFOL 10 MG/ML IV BOLUS
INTRAVENOUS | Status: AC
  Filled 2016-02-07: qty 20

## 2016-02-07 MED ORDER — PROPOFOL 10 MG/ML IV BOLUS
INTRAVENOUS | Status: DC | PRN
  Administered 2016-02-07: 170 mg via INTRAVENOUS

## 2016-02-07 MED ORDER — DIPHENHYDRAMINE HCL 12.5 MG/5ML PO ELIX: 12.5000 mg | ORAL_SOLUTION | Freq: Four times a day (QID) | ORAL | Status: DC | PRN

## 2016-02-07 MED ORDER — HYDROMORPHONE HCL 1 MG/ML IJ SOLN
INTRAMUSCULAR | Status: AC
  Administered 2016-02-07: 0.5 mg via INTRAVENOUS
  Filled 2016-02-07: qty 1

## 2016-02-07 MED ORDER — NORTRIPTYLINE HCL 25 MG PO CAPS
100.0000 mg | ORAL_CAPSULE | Freq: Every day | ORAL | Status: DC
  Filled 2016-02-07 (×3): qty 4

## 2016-02-07 MED ORDER — EPHEDRINE 5 MG/ML INJ
INTRAVENOUS | Status: AC
  Filled 2016-02-07: qty 10

## 2016-02-07 MED ORDER — DIPHENHYDRAMINE HCL 50 MG/ML IJ SOLN: 12.5000 mg | Freq: Four times a day (QID) | INTRAMUSCULAR | Status: DC | PRN

## 2016-02-07 MED ORDER — MEPERIDINE HCL 25 MG/ML IJ SOLN: 6.2500 mg | INTRAMUSCULAR | Status: DC | PRN

## 2016-02-07 MED ORDER — IPRATROPIUM BROMIDE 0.06 % NA SOLN
2.0000 | Freq: Four times a day (QID) | NASAL | Status: DC
  Filled 2016-02-07: qty 15

## 2016-02-07 MED ORDER — SCOPOLAMINE 1 MG/3DAYS TD PT72
1.0000 | MEDICATED_PATCH | Freq: Once | TRANSDERMAL | Status: DC
  Administered 2016-02-07: 1.5 mg via TRANSDERMAL

## 2016-02-07 MED ORDER — CEFAZOLIN SODIUM-DEXTROSE 2-4 GM/100ML-% IV SOLN
2.0000 g | INTRAVENOUS | Status: AC
  Administered 2016-02-07: 2 g via INTRAVENOUS

## 2016-02-07 MED ORDER — LIDOCAINE HCL (CARDIAC) 20 MG/ML IV SOLN
INTRAVENOUS | Status: DC | PRN
  Administered 2016-02-07: 30 mg via INTRAVENOUS

## 2016-02-07 MED ORDER — ACETAMINOPHEN 10 MG/ML IV SOLN
INTRAVENOUS | Status: DC | PRN
  Administered 2016-02-07: 1000 mg via INTRAVENOUS

## 2016-02-07 MED ORDER — VERAPAMIL HCL ER 120 MG PO TBCR
120.0000 mg | EXTENDED_RELEASE_TABLET | Freq: Every day | ORAL | Status: DC
  Filled 2016-02-07 (×4): qty 1

## 2016-02-07 MED ORDER — SUGAMMADEX SODIUM 200 MG/2ML IV SOLN
INTRAVENOUS | Status: DC | PRN
Start: 2016-02-07 — End: 2016-02-07
  Administered 2016-02-07: 174.2 mg via INTRAVENOUS

## 2016-02-07 MED ORDER — DIVALPROEX SODIUM ER 500 MG PO TB24: 500.0000 mg | ORAL_TABLET | Freq: Two times a day (BID) | ORAL | Status: DC

## 2016-02-07 MED ORDER — KETOROLAC TROMETHAMINE 30 MG/ML IJ SOLN
INTRAMUSCULAR | Status: AC
  Filled 2016-02-07: qty 1

## 2016-02-07 MED ORDER — NALOXONE HCL 0.4 MG/ML IJ SOLN: 0.4000 mg | INTRAMUSCULAR | Status: DC | PRN

## 2016-02-07 MED ORDER — MIDAZOLAM HCL 2 MG/2ML IJ SOLN
INTRAMUSCULAR | Status: DC | PRN
  Administered 2016-02-07: 2 mg via INTRAVENOUS

## 2016-02-07 MED ORDER — EPHEDRINE SULFATE 50 MG/ML IJ SOLN
INTRAMUSCULAR | Status: DC | PRN
Start: 2016-02-07 — End: 2016-02-07
  Administered 2016-02-07: 15 mg via INTRAVENOUS

## 2016-02-07 SURGICAL SUPPLY — 35 items
CANISTER SUCT 3000ML (MISCELLANEOUS) ×2 IMPLANT
CLOTH BEACON ORANGE TIMEOUT ST (SAFETY) ×2 IMPLANT
CONT PATH 16OZ SNAP LID 3702 (MISCELLANEOUS) ×2 IMPLANT
DECANTER SPIKE VIAL GLASS SM (MISCELLANEOUS) ×4 IMPLANT
DRAPE CESAREAN BIRTH W POUCH (DRAPES) ×2 IMPLANT
DRAPE WARM FLUID 44X44 (DRAPE) IMPLANT
DRSG OPSITE POSTOP 4X10 (GAUZE/BANDAGES/DRESSINGS) ×2 IMPLANT
DURAPREP 26ML APPLICATOR (WOUND CARE) ×2 IMPLANT
GAUZE SPONGE 4X4 16PLY XRAY LF (GAUZE/BANDAGES/DRESSINGS) ×2 IMPLANT
GLOVE BIOGEL PI IND STRL 6.5 (GLOVE) ×1 IMPLANT
GLOVE BIOGEL PI IND STRL 7.0 (GLOVE) ×3 IMPLANT
GLOVE BIOGEL PI INDICATOR 6.5 (GLOVE) ×1
GLOVE BIOGEL PI INDICATOR 7.0 (GLOVE) ×3
GLOVE SURG SS PI 6.0 STRL IVOR (GLOVE) ×2 IMPLANT
GOWN STRL REUS W/TWL LRG LVL3 (GOWN DISPOSABLE) ×6 IMPLANT
NEEDLE HYPO 22GX1.5 SAFETY (NEEDLE) ×2 IMPLANT
NS IRRIG 1000ML POUR BTL (IV SOLUTION) ×2 IMPLANT
PACK ABDOMINAL GYN (CUSTOM PROCEDURE TRAY) ×2 IMPLANT
PAD ABD 8X7 1/2 STERILE (GAUZE/BANDAGES/DRESSINGS) ×2 IMPLANT
PAD OB MATERNITY 4.3X12.25 (PERSONAL CARE ITEMS) ×2 IMPLANT
PENCIL SMOKE EVAC W/HOLSTER (ELECTROSURGICAL) ×2 IMPLANT
PROTECTOR NERVE ULNAR (MISCELLANEOUS) ×4 IMPLANT
SEPRAFILM MEMBRANE 5X6 (MISCELLANEOUS) IMPLANT
SPONGE GAUZE 4X4 12PLY STER LF (GAUZE/BANDAGES/DRESSINGS) ×2 IMPLANT
SPONGE LAP 18X18 X RAY DECT (DISPOSABLE) ×2 IMPLANT
STRIP CLOSURE SKIN 1/4X4 (GAUZE/BANDAGES/DRESSINGS) ×2 IMPLANT
SUT VIC AB 0 CT1 18XCR BRD8 (SUTURE) ×2 IMPLANT
SUT VIC AB 0 CT1 36 (SUTURE) ×4 IMPLANT
SUT VIC AB 0 CT1 8-18 (SUTURE) ×2
SUT VIC AB 2-0 CT1 (SUTURE) ×2 IMPLANT
SUT VIC AB 4-0 KS 27 (SUTURE) ×2 IMPLANT
SUT VICRYL 0 TIES 12 18 (SUTURE) ×2 IMPLANT
SYR CONTROL 10ML LL (SYRINGE) ×2 IMPLANT
TOWEL OR 17X24 6PK STRL BLUE (TOWEL DISPOSABLE) ×4 IMPLANT
TRAY FOLEY CATH SILVER 14FR (SET/KITS/TRAYS/PACK) ×2 IMPLANT

## 2016-02-07 NOTE — Anesthesia Postprocedure Evaluation (Signed)
Anesthesia Post Note  Patient: Christine Vega  Procedure(s) Performed: Procedure(s) (LRB): HYSTERECTOMY ABDOMINAL WITH  left SALPINGECTOMY (Left)  Patient location during evaluation: [facility] Anesthesia Type: General Level of consciousness: awake and alert Pain management: pain level controlled Vital Signs Assessment: post-procedure vital signs reviewed and stable Respiratory status: spontaneous breathing Cardiovascular status: blood pressure returned to baseline     Last Vitals:  Vitals:   02/07/16 2111 02/07/16 2126  BP:  123/63  Pulse:  93  Resp: 20 (!) 22  Temp:  37.8 C    Last Pain:  Vitals:   02/07/16 2111  TempSrc:   PainSc: 6    Pain Goal: Patients Stated Pain Goal: 3 (02/07/16 1645)               Birdena Crandall, Velvet Bathe

## 2016-02-07 NOTE — Op Note (Signed)
Christine Vega PROCEDURE DATE: 02/07/2016  PREOPERATIVE DIAGNOSIS:  45 y.o. [id number] with symptomatic fibroids, menorrhagia POSTOPERATIVE DIAGNOSIS:  Same SURGEON:   Mora Bellman, M.D. ASSISTANT:   Darron Doom OPERATION:  Total abdominal hysterectomy with left salpingectomy ANESTHESIA:  General endotracheal.  INDICATIONS: The patient is a 45 y.o. [id number] with history of symptomatic uterine fibroids/menorrhagia. The patient made a decision to undergo definite surgical treatment. On the preoperative visit, the risks, benefits, indications, and alternatives of the procedure were reviewed with the patient.  On the day of surgery, the risks of surgery were again discussed with the patient including but not limited to: bleeding which may require transfusion or reoperation; infection which may require antibiotics; injury to bowel, bladder, ureters or other surrounding organs; need for additional procedures; thromboembolic phenomenon, incisional problems and other postoperative/anesthesia complications. Written informed consent was obtained.    OPERATIVE FINDINGS: A 12 week size uterus with normal left tube and ovary. Right tube and ovary previously surgically removed  ESTIMATED BLOOD LOSS: 150 ml FLUIDS:  1300 ml of Lactated Ringers URINE OUTPUT:  30 ml of clear yellow urine. SPECIMENS:  Uterus, cervix, and left tube sent to pathology COMPLICATIONS:  None immediate.   DESCRIPTION OF PROCEDURE: The patient received intravenous antibiotics and had sequential compression devices applied to her lower extremities while in the preoperative area.   She was taken to the operating room where anesthesia was induced and found to be adequate. The patient was placed in supine position. The abdomen and perineum were prepped and draped in a sterile manner, and a Foley catheter was inserted into the bladder and attached to Christine Vega drainage. After an adequate timeout was performed, a Pfannensteil skin incision was  made. This incision was taken down to the fascia using electrocautery with care given to maintain good hemostasis. The fascia was incised in the midline and the fascial incision was then extended bilaterally using mayo scissors. The fascia was then dissected off the underlying rectus muscles using blunt and sharp dissection. The rectus muscles were split bluntly in the midline and the peritoneum entered sharply without complication. This peritoneal incision was then extended superiorly and inferiorly with care given to prevent bowel or bladder injury. Upon entry into the abdominal cavity, the upper abdomen was inspected and found to be normal. Attention was then turned to the pelvis. A retractor was placed into the incision, and the bowel was packed away with moist laparotomy sponges. The uterus at this point was noted to be mobilized. The round ligaments on each side were clamped, suture ligated, and transected with electrocautery allowing entry into the broad ligament. The anterior and posterior leaves of the broad ligament were separated and the ureters were inspected to be safely away from the area of dissection bilaterally. A hole was created in the clear portion of the posterior broad ligament. Adnexae were clamped on the patient's right side. This pedicle was then clamped, cut, and doubly suture ligated with good hemostasis. This procedure was repeated in an identical fashion on the left site allowing for left adnexa to remain in place.  A bladder flap was then created across the anterior leaf of the broad ligament and the bladder was then bluntly dissected off the lower uterine segment and cervix with good hemostasis. The uterine arteries were then skeletonized bilaterally and then clamped, cut, and ligated with care given to prevent ureteral injury. The uterosacral ligaments were then clamped, cut, and ligated bilaterally. Finally, the cardinal ligaments were clamped, cut, and ligated bilaterally.  Acutely  curved clamps were placed across the vagina just under the cervix, and the specimen was amputated and sent to pathology. The vaginal cuff was closed in a running locked fashion with 0 Vicryl with care given to incorporate the anterior pubocervical fascia and the posterior rectovaginal fascia.  The left fallopian tube was grasped with a Kelly clamp, transected and suture ligated. The vaginal cuff angles were noted to have good hemostasis.  The pelvis was irrigated and hemostasis was reconfirmed at all pedicles and along the pelvic sidewall.  All laparotomy sponges and instruments were removed from the abdomen. The peritoneum was closed with 2-0 Vicryl, and the fascia was closed with 0 Vicryl in a running fashion. The subcutaneous layer was reapproximated with 2-0 plain gut. The skin was closed with a 3-0 Vicryl subcuticular stitch. Sponge, lap, needle, and instrument counts were correct times two. The patient was taken to the recovery area awake, extubated and in stable condition.

## 2016-02-07 NOTE — Anesthesia Preprocedure Evaluation (Signed)
Anesthesia Evaluation  Patient identified by MRN, date of birth, ID band Patient awake    Reviewed: Allergy & Precautions, H&P , NPO status , Patient's Chart, lab work & pertinent test results  Airway Mallampati: III  TM Distance: >3 FB Neck ROM: Full    Dental no notable dental hx. (+) Teeth Intact, Caps,    Pulmonary    Pulmonary exam normal breath sounds clear to auscultation       Cardiovascular hypertension, Pt. on medications Normal cardiovascular exam+ Valvular Problems/Murmurs MR  Rhythm:Regular Rate:Normal     Neuro/Psych  Headaches, Seizures -, Well Controlled,  Anxiety Depression PTSD Neuromuscular disease    GI/Hepatic negative GI ROS, Neg liver ROS,   Endo/Other  Hyperlipidemia  Renal/GU negative Renal ROS     Musculoskeletal negative musculoskeletal ROS (+)   Abdominal   Peds  Hematology  (+) Blood dyscrasia, anemia ,   Anesthesia Other Findings   Reproductive/Obstetrics Complex Right Ovarian Cyst                             Anesthesia Physical  Anesthesia Plan  ASA: II  Anesthesia Plan: General   Post-op Pain Management:    Induction: Intravenous  Airway Management Planned: Oral ETT  Additional Equipment:   Intra-op Plan:   Post-operative Plan: Extubation in OR  Informed Consent: I have reviewed the patients History and Physical, chart, labs and discussed the procedure including the risks, benefits and alternatives for the proposed anesthesia with the patient or authorized representative who has indicated his/her understanding and acceptance.   Dental advisory given  Plan Discussed with: Anesthesiologist, CRNA and Surgeon  Anesthesia Plan Comments:         Anesthesia Quick Evaluation

## 2016-02-07 NOTE — Anesthesia Procedure Notes (Signed)
Procedure Name: Intubation Date/Time: 02/07/2016 8:46 AM Performed by: Tobin Chad Pre-anesthesia Checklist: Patient identified, Patient being monitored, Emergency Drugs available, Timeout performed and Suction available Patient Re-evaluated:Patient Re-evaluated prior to inductionOxygen Delivery Method: Circle system utilized and Simple face mask Preoxygenation: Pre-oxygenation with 100% oxygen Intubation Type: IV induction Ventilation: Mask ventilation without difficulty Laryngoscope Size: Mac Grade View: Grade II Tube type: Oral Tube size: 7.0 mm Number of attempts: 1 Placement Confirmation: ETT inserted through vocal cords under direct vision,  positive ETCO2 and breath sounds checked- equal and bilateral Secured at: 22 (com) cm Tube secured with: Tape Dental Injury: Teeth and Oropharynx as per pre-operative assessment

## 2016-02-07 NOTE — Transfer of Care (Signed)
Immediate Anesthesia Transfer of Care Note  Patient: Christine Vega  Procedure(s) Performed: Procedure(s): HYSTERECTOMY ABDOMINAL WITH  left SALPINGECTOMY (Left)  Patient Location: PACU  Anesthesia Type:General  Level of Consciousness: awake, alert , oriented and patient cooperative  Airway & Oxygen Therapy: Patient Spontanous Breathing and Patient connected to nasal cannula oxygen  Post-op Assessment: Report given to RN and Post -op Vital signs reviewed and stable  Post vital signs: Reviewed and stable  Last Vitals:  Vitals:   02/07/16 0744  BP: (!) 136/91  Pulse: 96  Resp: (!) 22  Temp: 36.7 C    Last Pain:  Vitals:   02/07/16 0744  TempSrc: Oral      Patients Stated Pain Goal: 3 ([id number])  Complications: No apparent anesthesia complications

## 2016-02-08 ENCOUNTER — Telehealth: Payer: Self-pay

## 2016-02-08 ENCOUNTER — Encounter (HOSPITAL_COMMUNITY): Payer: Self-pay | Admitting: Obstetrics and Gynecology

## 2016-02-08 LAB — URINALYSIS, ROUTINE W REFLEX MICROSCOPIC
Bilirubin Urine: NEGATIVE
Glucose, UA: NEGATIVE mg/dL
Ketones, ur: NEGATIVE mg/dL
Leukocytes, UA: NEGATIVE
Nitrite: NEGATIVE
Protein, ur: NEGATIVE mg/dL
Specific Gravity, Urine: 1.01 (ref 1.005–1.030)
pH: 6.5 (ref 5.0–8.0)

## 2016-02-08 LAB — CBC
HCT: 31.3 % — ABNORMAL LOW (ref 36.0–46.0)
Hemoglobin: 10.7 g/dL — ABNORMAL LOW (ref 12.0–15.0)
MCH: 29.3 pg (ref 26.0–34.0)
MCHC: 34.2 g/dL (ref 30.0–36.0)
MCV: 85.8 fL (ref 78.0–100.0)
Platelets: 252 10*3/uL (ref 150–400)
RBC: 3.65 MIL/uL — ABNORMAL LOW (ref 3.87–5.11)
RDW: 13.6 % (ref 11.5–15.5)
WBC: 8.1 10*3/uL (ref 4.0–10.5)

## 2016-02-08 LAB — URINE MICROSCOPIC-ADD ON

## 2016-02-08 MED ORDER — IBUPROFEN 600 MG PO TABS
600.0000 mg | ORAL_TABLET | Freq: Four times a day (QID) | ORAL | Status: DC | PRN
  Administered 2016-02-08 – 2016-02-09 (×2): 600 mg via ORAL
  Filled 2016-02-08 (×2): qty 1

## 2016-02-08 MED ORDER — OXYCODONE-ACETAMINOPHEN 5-325 MG PO TABS: 1.0000 | ORAL_TABLET | Freq: Four times a day (QID) | ORAL | Status: DC | PRN

## 2016-02-08 MED ORDER — HYDROCODONE-ACETAMINOPHEN 5-325 MG PO TABS: 1.0000 | ORAL_TABLET | ORAL | Status: DC | PRN

## 2016-02-08 MED ORDER — ONDANSETRON 4 MG PO TBDP
4.0000 mg | ORAL_TABLET | Freq: Four times a day (QID) | ORAL | Status: DC | PRN
  Administered 2016-02-08: 4 mg via ORAL
  Filled 2016-02-08 (×2): qty 1

## 2016-02-08 NOTE — Progress Notes (Signed)
Patient still complaining of Nausea declines further medication at this time. No vomiting since administration of Promethazine. Patient declined evening medications and at this time is still unwilling to have them administered due to her nausea. Foley was removed per order and patient has voided twice since, small amount of vaginal bleeding, pressure dressing clean, dry and intact. VS stable and within normal parameters.

## 2016-02-08 NOTE — Telephone Encounter (Signed)
Called patient to schedule postop appt, no answer, left voicemail instructing her to return my call here at the office

## 2016-02-08 NOTE — Progress Notes (Signed)
1 Day Post-Op Procedure(s) (LRB): HYSTERECTOMY ABDOMINAL WITH  left SALPINGECTOMY (Left)  Subjective: Patient reports feeling well. She has ambulated and voided without complaints. She denies feeling dizzy or lightheaded. She reports some nausea but no emesis. She is tolerating clear liquid diet and is fearful to advance her diet to regular    Objective: I have reviewed patient's vital signs, intake and output, medications, labs and pathology.  General: alert, cooperative and no distress Resp: clear to auscultation bilaterally Cardio: regular rate and rhythm GI: soft, non-tender; bowel sounds normal; no masses,  no organomegaly and incision: clean, dry and intact Extremities: extremities normal, atraumatic, no cyanosis or edema  Assessment: s/p Procedure(s): HYSTERECTOMY ABDOMINAL WITH  left SALPINGECTOMY (Left): stable and progressing well  Plan: Advance diet Encourage ambulation  Discharge planning for tomorrow   LOS: 1 day    Christine Vega 02/08/2016, 1:23 PM

## 2016-02-08 NOTE — Progress Notes (Signed)
Pt refusing to take am meds this morning around 10 because she is feeling nauseous and hasn't eaten yet. MD is aware and told pt to take them when she feels like she can per pt. Marry Guan

## 2016-02-08 NOTE — Telephone Encounter (Signed)
-----   Message from Mora Bellman, MD sent at 02/08/2016  1:11 PM EDT ----- Please schedule patient for post op check with me in 4-6 weeks  Thanks  Vickii Chafe

## 2016-02-08 NOTE — Plan of Care (Signed)
Problem: Bowel/Gastric: Goal: Gastrointestinal status for postoperative course will improve Outcome: Not Progressing Pt continues to have nausea, zofran given. Will continue to monitor.

## 2016-02-09 MED ORDER — HYDROCODONE-ACETAMINOPHEN 5-325 MG PO TABS: 1.0000 | ORAL_TABLET | ORAL | 0 refills | Status: DC | PRN

## 2016-02-09 MED ORDER — DOCUSATE SODIUM 100 MG PO CAPS: 100.0000 mg | ORAL_CAPSULE | Freq: Two times a day (BID) | ORAL | 2 refills | Status: DC | PRN

## 2016-02-09 NOTE — Progress Notes (Signed)
Pt. Is discharged in the care of friend. Denies any pain or discomfort.. Abdominal incisions clean and dry. Understands all discharged instructions well No equipment needed for home use.Marland Kitchen

## 2016-02-09 NOTE — Discharge Summary (Signed)
Physician Discharge Summary  Patient ID: Christine Vega MRN: [id number] DOB/AGE: [birth year] 47 y.o.  Admit date: 02/07/2016 Discharge date: 02/09/2016  Admission Diagnoses: Symptomatic fibroid uterus  Discharge Diagnoses:  Same s/p TAH with left salpingectomy Active Problems:   Uterine leiomyoma   S/P TAH (total abdominal hysterectomy)   Discharged Condition: good  Hospital Course: 45 yo G2p2 admitted for scheduled TAH for the definitive treatment of symptomatic fibroid uterus. Patient had an uncomplicated surgery with the removal of a 12-week size fibroid uterus. Patient did well post operatively. She remained afebrile. She ambulated, passed flatus, voided well. She tolerated a regular diet. Discharge instruction were reviewed. All questions were answered  Consults: None  Significant Diagnostic Studies: labs: Hg 12.9--> 10.7 on POD#1  Treatments: surgery: TAH with left salpingectomy  Discharge Exam: Blood pressure 115/71, pulse 95, temperature 97.7 F (36.5 C), temperature source Oral, resp. rate 18, height 5' 11.5" (1.816 m), weight 190 lb (86.2 kg), last menstrual period 01/13/2016, SpO2 100 %. General appearance: alert, cooperative and no distress Resp: clear to auscultation bilaterally Cardio: regular rate and rhythm GI: soft, non-tender; bowel sounds normal; no masses,  no organomegaly Extremities: extremities normal, atraumatic, no cyanosis or edema Incision/Wound: Honey comb dressing clean dry and intact  Disposition: 01-Home or Self Care     Medication List    STOP taking these medications   naproxen 500 MG tablet Commonly known as:  NAPROSYN     TAKE these medications   buPROPion 150 MG 24 hr tablet Commonly known as:  WELLBUTRIN XL Take 150 mg by mouth daily.   butalbital-acetaminophen-caffeine 50-325-40 MG tablet Commonly known as:  FIORICET, ESGIC Take 1 tablet by mouth every 8 (eight) hours as needed for migraine.   cetirizine 10 MG  tablet Commonly known as:  ZYRTEC Take 10 mg by mouth daily as needed for allergies.   cholecalciferol 1000 units tablet Commonly known as:  VITAMIN D Take 1,000 Units by mouth daily.   cyclobenzaprine 10 MG tablet Commonly known as:  FLEXERIL Take 1 tablet (10 mg total) by mouth 3 (three) times daily as needed for muscle spasms (and pain).   divalproex 500 MG DR tablet Commonly known as:  DEPAKOTE Take 1,000 mg by mouth 2 (two) times daily.   docusate sodium 100 MG capsule Commonly known as:  COLACE Take 1 capsule (100 mg total) by mouth 2 (two) times daily as needed.   ferrous sulfate 325 (65 FE) MG tablet Take 325 mg by mouth 3 (three) times daily with meals.   fluticasone 50 MCG/ACT nasal spray Commonly known as:  FLONASE Place 2 sprays into both nostrils 2 (two) times daily as needed for rhinitis. Each nostril   gabapentin 300 MG capsule Commonly known as:  NEURONTIN Take 300 mg by mouth 3 (three) times daily.   HYDROcodone-acetaminophen 5-325 MG tablet Commonly known as:  NORCO/VICODIN Take 1-2 tablets by mouth every 4 (four) hours as needed for moderate pain. What changed:  how much to take  when to take this  reasons to take this   hydrOXYzine 25 MG capsule Commonly known as:  VISTARIL Take 25 mg by mouth 3 (three) times daily as needed for anxiety.   ipratropium 0.06 % nasal spray Commonly known as:  ATROVENT Place 2 sprays into both nostrils 4 (four) times daily.   lubiprostone 24 MCG capsule Commonly known as:  AMITIZA Take 24 mcg by mouth 2 (two) times daily before a meal.   PAMELOR 50 MG capsule Generic  drug:  nortriptyline Take 100 mg by mouth at bedtime.   prochlorperazine 10 MG tablet Commonly known as:  COMPAZINE Take 10 mg by mouth every 6 (six) hours as needed for nausea or vomiting.   PROMETHEGAN 50 MG suppository Generic drug:  promethazine Place 50 mg rectally every 8 (eight) hours as needed for nausea or vomiting.   propranolol  ER 120 MG 24 hr capsule Commonly known as:  INDERAL LA Take 120 mg by mouth daily.   sertraline 100 MG tablet Commonly known as:  ZOLOFT Take 150 mg by mouth daily.   tiZANidine 4 MG tablet Commonly known as:  ZANAFLEX Take 4 mg by mouth every 12 (twelve) hours as needed for muscle spasms.   vitamin E 100 UNIT capsule Take 100 Units by mouth daily.   ZOLMitriptan 2.5 MG Soln Place 1 spray into both nostrils daily as needed (for migraine).        Signed: Statia Burdick 02/09/2016, 1:24 PM

## 2016-02-09 NOTE — Discharge Instructions (Signed)
Abdominal Hysterectomy, Care After °Refer to this sheet in the next few weeks. These instructions provide you with information on caring for yourself after your procedure. Your health care provider may also give you more specific instructions. Your treatment has been planned according to current medical practices, but problems sometimes occur. Call your health care provider if you have any problems or questions after your procedure.  °WHAT TO EXPECT AFTER THE PROCEDURE °After your procedure, it is typical to have the following: °· Pain. °· Feeling tired. °· Poor appetite. °· Less interest in sex. °It takes 4-6 weeks to recover from this surgery.  °HOME CARE INSTRUCTIONS  °· Take pain medicines only as directed by your health care provider. Do not take over-the-counter pain medicines without checking with your health care provider first.  °· Change your bandage as directed by your health care provider. °· Return to your health care provider to have your sutures taken out. °· Take showers instead of baths for 2-3 weeks. Ask your health care provider when it is safe to start showering.  °· Do not douche, use tampons, or have sexual intercourse for at least 6 weeks or until your health care provider says you can.   °· Follow your health care provider's advice about exercise, lifting, driving, and general activities. °· Get plenty of rest and sleep.   °· Do not lift anything heavier than a gallon of milk (about 10 lb [4.5 kg]) for the first month after surgery. °· You can resume your normal diet if your health care provider says it is okay.   °· Do not drink alcohol until your health care provider says you can.   °· If you are constipated, ask your health care provider if you can take a mild laxative. °· Eating foods high in fiber may also help with constipation. Eat plenty of raw fruits and vegetables, whole grains, and beans. °· Drink enough fluids to keep your urine clear or pale yellow.   °· Try to have someone at  home with you for the first 1-2 weeks to help around the house. °· Keep all follow-up appointments. °SEEK MEDICAL CARE IF:  °· You have chills or fever. °· You have swelling, redness, or pain in the area of your incision that is getting worse.   °· You have pus coming from the incision.   °· You notice a bad smell coming from the incision or bandage.   °· Your incision breaks open.   °· You feel dizzy or light-headed.   °· You have pain or bleeding when you urinate.   °· You have persistent diarrhea.   °· You have persistent nausea and vomiting.   °· You have abnormal vaginal discharge.   °· You have a rash.   °· You have any type of abnormal reaction or develop an allergy to your medicine.   °· Your pain medicine is not helping.   °SEEK IMMEDIATE MEDICAL CARE IF:  °· You have a fever and your symptoms suddenly get worse. °· You have severe abdominal pain. °· You have chest pain. °· You have shortness of breath. °· You faint. °· You have pain, swelling, or redness of your leg. °· You have heavy vaginal bleeding with blood clots. °MAKE SURE YOU: °· Understand these instructions. °· Will watch your condition. °· Will get help right away if you are not doing well or get worse. °  °This information is not intended to replace advice given to you by your health care provider. Make sure you discuss any questions you have with your health care provider. °  °Document   Released: 10/20/2004 Document Revised: 04/23/2014 Document Reviewed: 01/23/2013 °Elsevier Interactive Patient Education ©2016 [employer] ° °

## 2016-02-13 ENCOUNTER — Telehealth: Payer: Self-pay | Admitting: *Deleted

## 2016-02-13 DIAGNOSIS — N760 Acute vaginitis: Principal | ICD-10-CM

## 2016-02-13 DIAGNOSIS — B9689 Other specified bacterial agents as the cause of diseases classified elsewhere: Secondary | ICD-10-CM

## 2016-02-13 MED ORDER — FLUCONAZOLE 150 MG PO TABS: 150.0000 mg | ORAL_TABLET | Freq: Once | ORAL | 0 refills | Status: AC

## 2016-02-13 MED ORDER — METRONIDAZOLE 500 MG PO TABS: 500.0000 mg | ORAL_TABLET | Freq: Two times a day (BID) | ORAL | 0 refills | Status: DC

## 2016-02-13 NOTE — Telephone Encounter (Signed)
Pt has a hx of recurrent BV infections, states she is experiencing a vaginal discharge with a fishy odor.  Sent Flagyl to the pharmacy and one Diflucan per pt request.  Instructed on medication use.

## 2016-02-13 NOTE — Telephone Encounter (Signed)
-----   Message from Francia Greaves sent at 02/13/2016  9:55 AM EDT ----- Regarding: Rx Request Contact: [phone] Thinks she has BV Uses [facility] on [address]

## 2016-02-14 ENCOUNTER — Telehealth: Payer: Self-pay | Admitting: *Deleted

## 2016-02-14 NOTE — Telephone Encounter (Signed)
Called pt back and she asked about the 4-6 weeks healing time, 2 weeks before she can drive, and wanting to see if she could have intercourse or she needed to wait. Advised pt to wait until post-op appt to be released to full duty.

## 2016-02-14 NOTE — Telephone Encounter (Signed)
-----   Message from Ricka Burdock, RN sent at 02/14/2016  4:41 PM EDT ----- Regarding: FW: Advise Contact: [phone]   ----- Message ----- From: Francia Greaves Sent: 02/14/2016   3:11 PM To: Ricka Burdock, RN Subject: Advise                                         Has postop questions

## 2016-02-16 ENCOUNTER — Encounter: Payer: Self-pay | Admitting: Oncology

## 2016-02-16 ENCOUNTER — Telehealth: Payer: Self-pay | Admitting: Oncology

## 2016-02-16 NOTE — Telephone Encounter (Signed)
Pt confirmed, verified demo and insurance, mailed pt letter, faxed referring provider appt date/time

## 2016-02-21 ENCOUNTER — Telehealth: Payer: Self-pay | Admitting: Obstetrics and Gynecology

## 2016-02-21 NOTE — Telephone Encounter (Signed)
Pt had surgery on 02/07/16, and she states that her incision site is burning and hot to the touch. There is no odor and no oozing. Please advise.

## 2016-02-22 ENCOUNTER — Ambulatory Visit (INDEPENDENT_AMBULATORY_CARE_PROVIDER_SITE_OTHER): Payer: Medicaid Other | Admitting: Obstetrics and Gynecology

## 2016-02-22 VITALS — BP 106/70 | HR 90 | Temp 98.2°F | Resp 20 | Wt 196.0 lb

## 2016-02-22 DIAGNOSIS — Z9889 Other specified postprocedural states: Secondary | ICD-10-CM

## 2016-02-22 NOTE — Progress Notes (Signed)
Patient here for incision check. Patient reports doing well at home. She denies any fevers, chills, abnormal drainage from the incision. Her pain is well controlled with ibuprofen. She states that her incision is burning at times.  Blood pressure 106/70, pulse 90, temperature 98.2 F (36.8 C), resp. rate 20, weight 196 lb (88.9 kg), last menstrual period 01/13/2016.  GENERAL: Well-developed, well-nourished female in no acute distress.  ABDOMEN: Soft, nontender, nondistended. No organomegaly. INCISION: no erythema, induration or drainage. Incision is healing well EXTREMITIES: No cyanosis, clubbing, or edema, 2+ distal pulses.  A/P 45 yo here for incision check - Reassurance provided - RTC for post op check in a few weeks - Precautions reviewed with the patient

## 2016-02-22 NOTE — Progress Notes (Signed)
Pt is s/p hysterectomy, c/o her incision burning and feeling hot to the touch.  Also feels she is having some chills occasionally.  Here for an incision check to r/o infection.

## 2016-02-29 ENCOUNTER — Telehealth: Payer: Self-pay | Admitting: Obstetrics & Gynecology

## 2016-02-29 DIAGNOSIS — B373 Candidiasis of vulva and vagina: Secondary | ICD-10-CM

## 2016-02-29 DIAGNOSIS — B3731 Acute candidiasis of vulva and vagina: Secondary | ICD-10-CM

## 2016-02-29 DIAGNOSIS — N760 Acute vaginitis: Secondary | ICD-10-CM

## 2016-02-29 DIAGNOSIS — B9689 Other specified bacterial agents as the cause of diseases classified elsewhere: Secondary | ICD-10-CM

## 2016-02-29 MED ORDER — METRONIDAZOLE 500 MG PO TABS: 500.0000 mg | ORAL_TABLET | Freq: Two times a day (BID) | ORAL | 0 refills | Status: DC

## 2016-02-29 MED ORDER — FLUCONAZOLE 150 MG PO TABS: 150.0000 mg | ORAL_TABLET | Freq: Once | ORAL | 0 refills | Status: AC

## 2016-02-29 NOTE — Telephone Encounter (Signed)
Patient states that she has completed all of her medication, but she is still having the odor. She states she might need another round to completely get rid of it. Patient uses the [facility] on [address] in [place]. Please advise

## 2016-02-29 NOTE — Telephone Encounter (Signed)
Pt was recently treated for BV, states she is still experiencing the vaginal odor and is S/P Vaginal hysterectomy.  Informed pt that is could be recurring during this healing period and sent Flagyl and Diflucan to pharmacy. Instructed pt on medication use.

## 2016-03-14 ENCOUNTER — Ambulatory Visit (INDEPENDENT_AMBULATORY_CARE_PROVIDER_SITE_OTHER): Payer: Medicaid Other | Admitting: Obstetrics and Gynecology

## 2016-03-14 ENCOUNTER — Encounter: Payer: Self-pay | Admitting: Obstetrics and Gynecology

## 2016-03-14 VITALS — BP 109/72 | HR 102 | Wt 195.0 lb

## 2016-03-14 DIAGNOSIS — Z9889 Other specified postprocedural states: Secondary | ICD-10-CM

## 2016-03-14 NOTE — Progress Notes (Signed)
45 yo G2P2 presenting today for post op check. Patient underwent a TAH/LS on 02/07/16. She reports doing well since her procedure. She denies fever/chills, worsening abdominal pain or abnormal drainage from her incision  Past Medical History:  Diagnosis Date  . Anemia    history   . Anxiety   . Back pain    rotates to left hip and tailbone  . Depression   . Endometriosis   . Fractured coccyx ([place])   . Heart murmur    no problems  . History of seasonal allergies   . Hyperlipidemia   . Migraine   . Migraines    tx w/depakote/verapamil per pt  . Murmur, heart   . Neuromuscular disorder (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 Post traumatic stress disorder (PTSD)   . Seizures ([place]) 2004   x 1; unknown source   Past Surgical History:  Procedure Laterality Date  . CHOLECYSTECTOMY    . HERNIA REPAIR    . HYSTERECTOMY ABDOMINAL WITH SALPINGECTOMY Left 02/07/2016   Procedure: HYSTERECTOMY ABDOMINAL WITH  left SALPINGECTOMY;  Surgeon: Mora Bellman, MD;  Location: [facility];  Service: Gynecology;  Laterality: Left;  . LAPAROSCOPY  05/16/2011   Procedure: LAPAROSCOPY OPERATIVE;  Surgeon: Emeterio Reeve, MD;  Location: [facility];  Service: Gynecology;  Laterality: N/A;  poss oophorectomy  . LAPAROSCOPY  08/06/2011   Procedure: LAPAROSCOPY OPERATIVE;  Surgeon: Woodroe Mode, MD;  Location: [facility];  Service: Gynecology;  Laterality: N/A;  . LAPAROTOMY     for endometriosis/adhesions  . SALPINGOOPHORECTOMY  08/06/2011   Procedure: SALPINGO OOPHERECTOMY;  Surgeon: Woodroe Mode, MD;  Location: [facility];  Service: Gynecology;  Laterality: Right;  . SVD     x 2  . TUBAL LIGATION    . WISDOM TOOTH EXTRACTION     Family History  Problem Relation Age of Onset  . Heart disease Father    Social History  Substance Use Topics  . Smoking status: Never Smoker  . Smokeless tobacco: Never Used  . Alcohol use No   ROS See pertinent in HPI  GENERAL: Well-developed, well-nourished  female in no acute distress.  ABDOMEN: Soft, nontender, nondistended. No organomegaly. Incision: Healing well. No erythema, induration or drainage  PELVIC: Normal external female genitalia. Vagina is pink and rugated.  Normal discharge. No adnexal mass or tenderness. EXTREMITIES: No cyanosis, clubbing, or edema, 2+ distal pulses.  A/P 45 yo here for post op check - Patient is medically cleared to resume all activities of daily living. Patient states that she has already returned to all of her activities with the exception of sexual intercourse - Patient due for screening mammogram in 06/2016 - RTC prn

## 2016-03-15 ENCOUNTER — Encounter: Payer: Self-pay | Admitting: Neurology

## 2016-03-15 ENCOUNTER — Ambulatory Visit (INDEPENDENT_AMBULATORY_CARE_PROVIDER_SITE_OTHER): Payer: Medicaid Other | Admitting: Neurology

## 2016-03-15 VITALS — BP 118/70 | HR 98 | Ht 71.0 in | Wt 190.0 lb

## 2016-03-15 DIAGNOSIS — G43719 Chronic migraine without aura, intractable, without status migrainosus: Secondary | ICD-10-CM

## 2016-03-15 NOTE — Progress Notes (Signed)
NEUROLOGY CONSULTATION NOTE  Christine Vega MRN: [id number] DOB: 05-30-1970  Referring provider: Dr. Jonelle Sidle Primary care provider: Dr. Jonelle Sidle  Reason for consult:  Migraine  HISTORY OF PRESENT ILLNESS: Christine Vega is a 45 year old woman with migraines, depression and chronic pain who presents for headache.  Onset:  45 years old Location:  Either temple Quality:  Throbbing, pressure Intensity:  10/10 Aura:  no Prodrome:  no Associated symptoms:  Nausea, vomiting, photophobia, phonophobia, osmophobia, blurred vision Duration:  3 days  Frequency:  16 headache days a month Triggers/exacerbating factors:  Certain smells, chocolate , garlic Relieving factors:  no Activity:  Cannot function  Past NSAIDS:  ibuprofen Past analgesics:  Tylenol, oxycodone, Excedrin, Fioricet Past abortive triptans:  Sumatriptan (tablet and injection), Maxalt, Zomig NS Past muscle relaxants:  Flexeril Past anti-emetic:  promethazine Past anti-anxiolytic:  lorazepam Past antihypertensive medications:  verapamil Past antidepressant medications:  no Past anticonvulsant medications:  Topiramate, Lyrica Past vitamins/Herbal/Supplements:  No Other therapy:  no  Current NSAIDS:  naproxen 500mg , ibuprofen 600mg  Current analgesics:  Hydrocodone Current triptans:  Zomig 2.5mg  NS Current anti-emetic:  promethazine 50mg  PR Current muscle relaxants:  tizanidine 4mg  Current anti-anxiolytic:  no Current sleep aide:  no Current Antihypertensive medications:  Propranolol ER 120mg  Current Antidepressant medications:  bupropion, nortriptyline 100mg  twice daily, sertraline 150mg  Current Anticonvulsant medications:  divalproex 500mg  24r twice daily, gabapentin 300mg  three times daily Current Vitamins/Herbal/Supplements:  D, E Current Antihistamines/Decongestants:  *hydroxyzine Other therapy:  no  Caffeine:  Rarely coffee, tea, soda Alcohol:  no Smoker:  no Diet:  Hydrates Exercise:   yes Depression/stress:  stable Sleep hygiene:  poor Family history of headache:  Mother, daughters  CT of head and cervical spine from 12/20/15 were personally reviewed and were negative for acute changes.  Cervical spine did demonstrate degenerative disc disease at C5-6 and C6-7.  PAST MEDICAL HISTORY: Past Medical History:  Diagnosis Date  . Anemia    history   . Anxiety   . Back pain    rotates to left hip and tailbone  . Depression   . Endometriosis   . Fractured coccyx ([place])   . Heart murmur    no problems  . History of seasonal allergies   . Hyperlipidemia   . Migraine   . Migraines    tx w/depakote/verapamil per pt  . Murmur, heart   . 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 Post traumatic stress disorder (PTSD)   . Seizures ([place]) 2004   x 1; unknown source    PAST SURGICAL HISTORY: Past Surgical History:  Procedure Laterality Date  . CHOLECYSTECTOMY    . HERNIA REPAIR    . HYSTERECTOMY ABDOMINAL WITH SALPINGECTOMY Left 02/07/2016   Procedure: HYSTERECTOMY ABDOMINAL WITH  left SALPINGECTOMY;  Surgeon: Mora Bellman, MD;  Location: [facility];  Service: Gynecology;  Laterality: Left;  . LAPAROSCOPY  05/16/2011   Procedure: LAPAROSCOPY OPERATIVE;  Surgeon: Emeterio Reeve, MD;  Location: [facility];  Service: Gynecology;  Laterality: N/A;  poss oophorectomy  . LAPAROSCOPY  08/06/2011   Procedure: LAPAROSCOPY OPERATIVE;  Surgeon: Woodroe Mode, MD;  Location: [facility];  Service: Gynecology;  Laterality: N/A;  . LAPAROTOMY     for endometriosis/adhesions  . SALPINGOOPHORECTOMY  08/06/2011   Procedure: SALPINGO OOPHERECTOMY;  Surgeon: Woodroe Mode, MD;  Location: [facility];  Service: Gynecology;  Laterality: Right;  . SVD     x 2  . TUBAL LIGATION    .  WISDOM TOOTH EXTRACTION      MEDICATIONS: Current Outpatient Prescriptions on File Prior to Visit  Medication Sig Dispense Refill  . buPROPion (WELLBUTRIN XL) 150 MG 24 hr tablet Take 150  mg by mouth daily.    . butalbital-acetaminophen-caffeine (FIORICET, ESGIC) 50-325-40 MG tablet Take 1 tablet by mouth every 8 (eight) hours as needed for migraine.    . cetirizine (ZYRTEC) 10 MG tablet Take 10 mg by mouth daily as needed for allergies.    . cholecalciferol (VITAMIN D) 1000 UNITS tablet Take 1,000 Units by mouth daily.    . divalproex (DEPAKOTE) 500 MG DR tablet Take 1,000 mg by mouth 2 (two) times daily.    Marland Kitchen docusate sodium (COLACE) 100 MG capsule Take 1 capsule (100 mg total) by mouth 2 (two) times daily as needed. 30 capsule 2  . ferrous sulfate 325 (65 FE) MG tablet Take 325 mg by mouth 3 (three) times daily with meals.     . fluticasone (FLONASE) 50 MCG/ACT nasal spray Place 2 sprays into both nostrils 2 (two) times daily as needed for rhinitis. Each nostril     . gabapentin (NEURONTIN) 300 MG capsule Take 300 mg by mouth 3 (three) times daily.    Marland Kitchen HYDROcodone-acetaminophen (NORCO/VICODIN) 5-325 MG tablet Take 1-2 tablets by mouth every 4 (four) hours as needed for moderate pain. 30 tablet 0  . hydrOXYzine (VISTARIL) 25 MG capsule Take 25 mg by mouth 3 (three) times daily as needed for anxiety.    Marland Kitchen ipratropium (ATROVENT) 0.06 % nasal spray Place 2 sprays into both nostrils 4 (four) times daily. 15 mL 1  . lubiprostone (AMITIZA) 24 MCG capsule Take 24 mcg by mouth 2 (two) times daily before a meal.    . nortriptyline (PAMELOR) 50 MG capsule Take 100 mg by mouth at bedtime.     . promethazine (PROMETHEGAN) 50 MG suppository Place 50 mg rectally every 8 (eight) hours as needed for nausea or vomiting.     . propranolol ER (INDERAL LA) 120 MG 24 hr capsule Take 120 mg by mouth daily.    . sertraline (ZOLOFT) 100 MG tablet Take 150 mg by mouth daily.    Marland Kitchen tiZANidine (ZANAFLEX) 4 MG tablet Take 4 mg by mouth every 12 (twelve) hours as needed for muscle spasms.     . vitamin E 100 UNIT capsule Take 100 Units by mouth daily.    Marland Kitchen ZOLMitriptan 2.5 MG SOLN Place 1 spray into both  nostrils daily as needed (for migraine).     . cyclobenzaprine (FLEXERIL) 10 MG tablet Take 1 tablet (10 mg total) by mouth 3 (three) times daily as needed for muscle spasms (and pain). (Patient not taking: Reported on 03/15/2016) 15 tablet 0  . prochlorperazine (COMPAZINE) 10 MG tablet Take 10 mg by mouth every 6 (six) hours as needed for nausea or vomiting.     No current facility-administered medications on file prior to visit.     ALLERGIES: Allergies  Allergen Reactions  . Penicillins Hives, Itching and Other (See Comments)    Has patient had a PCN reaction causing immediate rash, facial/tongue/throat swelling, SOB or lightheadedness with hypotension: No Has patient had a PCN reaction causing severe rash involving mucus membranes or skin necrosis: No Has patient had a PCN reaction that required hospitalization: No Has patient had a PCN reaction occurring within the last 10 years: Yes If all of the above answers are "NO", then may proceed with Cephalosporin use.   . Prednisone Hives  and Itching  . Zithromax [Azithromycin] Hives and Itching    FAMILY HISTORY: Family History  Problem Relation Age of Onset  . Migraines Mother   . Heart disease Father   . Migraines Father   . Migraines Daughter     SOCIAL HISTORY: Social History   Social History  . Marital status: Legally Separated    Spouse name: N/A  . Number of children: N/A  . Years of education: N/A   Occupational History  . Not on file.   Social History Main Topics  . Smoking status: Never Smoker  . Smokeless tobacco: Never Used  . Alcohol use No  . Drug use: No  . Sexual activity: Not Currently    Birth control/ protection: Surgical   Other Topics Concern  . Not on file   Social History Narrative  . No narrative on file    REVIEW OF SYSTEMS: Constitutional: No fevers, chills, or sweats, no generalized fatigue, change in appetite Eyes: No visual changes, double vision, eye pain Ear, nose and throat:  No hearing loss, ear pain, nasal congestion, sore throat Cardiovascular: No chest pain, palpitations Respiratory:  No shortness of breath at rest or with exertion, wheezes GastrointestinaI: No nausea, vomiting, diarrhea, abdominal pain, fecal incontinence Genitourinary:  No dysuria, urinary retention or frequency Musculoskeletal:  Neck pain, back pain Integumentary: No rash, pruritus, skin lesions Neurological: as above Psychiatric: No depression, insomnia, anxiety Endocrine: No palpitations, fatigue, diaphoresis, mood swings, change in appetite, change in weight, increased thirst Hematologic/Lymphatic:  No purpura, petechiae. Allergic/Immunologic: no itchy/runny eyes, nasal congestion, recent allergic reactions, rashes  PHYSICAL EXAM: Vitals:   03/15/16 1011  BP: 118/70  Pulse: 98   General: No acute distress.  Patient appears well-groomed.  Head:  Normocephalic/atraumatic Eyes:  fundi examined but not visualized Neck: supple, no paraspinal tenderness, full range of motion Back: No paraspinal tenderness Heart: regular rate and rhythm Lungs: Clear to auscultation bilaterally. Vascular: No carotid bruits. Neurological Exam: Mental status: alert and oriented to person, place, and time, recent and remote memory intact, fund of knowledge intact, attention and concentration intact, speech fluent and not dysarthric, language intact. Cranial nerves: CN I: not tested CN II: pupils equal, round and reactive to light, visual fields intact CN III, IV, VI:  full range of motion, no nystagmus, no ptosis CN V: facial sensation intact CN VII: upper and lower face symmetric CN VIII: hearing intact CN IX, X: gag intact, uvula midline CN XI: sternocleidomastoid and trapezius muscles intact CN XII: tongue midline Bulk & Tone: normal, no fasciculations. Motor:  5/5 throughout  Sensation: temperature and vibration sensation intact. Deep Tendon Reflexes:  2+ throughout, toes downgoing.  Finger  to nose testing:  Without dysmetria.  Heel to shin:  Without dysmetria.  Gait:  Right antalgic gait.  Able to turn and tandem walk. Romberg negative.  IMPRESSION: Chronic intractable migraines  PLAN: 1.  She has had over 15 headache days a month for years and has failed multiple preventative medications.  I think Botox would be the best treatment option at this time (with goal to eliminate some of her medications).  I discussed the benefits and potential side effects and she is agreeable.  We will get preauthorization. 2.  She has failed abortive pills as well as triptan nasal sprays.  I would like to try a different class with potential quick onset of action, Migranal. 3.  Try to limit use of pain relievers to no more than 2 days out of the  week. 4.  Follow up for Botox  Thank you for allowing me to take part in the care of this patient.  Metta Clines, DO  CC:  Gala Romney, MD

## 2016-03-15 NOTE — Patient Instructions (Signed)
1.  We will try to set you up for Botox. 2.  I want to get another nasal spray (called Migranal) approved for you so you can take it when you get the migraine:  1 spray in each nostril at earliest onset of headache.  May repeat every 15 minutes x 2 (no more than 6 total sprays in 24 hours OR no more than 8 total sprays in one week) 3.  Limit use of pain relievers to no more than 2 days out of the week.  These medications include acetaminophen, ibuprofen, triptans and narcotics.  This will help reduce risk of rebound headaches. 4.  Be aware of common food triggers such as processed sweets, processed foods with nitrites (such as deli meat, hot dogs, sausages), foods with MSG, alcohol (such as wine), chocolate, certain cheeses, certain fruits (dried fruits, some citrus fruit), vinegar, diet soda. 4.  Avoid caffeine 5.  Routine exercise 6.  Proper sleep hygiene 7.  Stay adequately hydrated with water 8.  Keep a headache diary. 9.  Maintain proper stress management. 10.  Do not skip meals. 11.  Consider supplements:  Magnesium citrate 400mg  to 600mg  daily, riboflavin 400mg , Coenzyme Q 10 100mg  three times daily

## 2016-03-23 ENCOUNTER — Ambulatory Visit: Payer: Medicaid Other | Admitting: Obstetrics and Gynecology

## 2016-03-27 ENCOUNTER — Encounter: Payer: Medicaid Other | Admitting: Oncology

## 2016-04-05 ENCOUNTER — Ambulatory Visit (HOSPITAL_BASED_OUTPATIENT_CLINIC_OR_DEPARTMENT_OTHER): Payer: Medicaid Other | Admitting: Oncology

## 2016-04-05 ENCOUNTER — Telehealth: Payer: Self-pay | Admitting: Oncology

## 2016-04-05 VITALS — BP 114/70 | HR 87 | Temp 98.2°F | Resp 18 | Ht 71.0 in | Wt 194.4 lb

## 2016-04-05 DIAGNOSIS — D500 Iron deficiency anemia secondary to blood loss (chronic): Secondary | ICD-10-CM | POA: Insufficient documentation

## 2016-04-05 NOTE — Progress Notes (Signed)
Reason for Referral: Iron deficiency anemia.   HPI: 45 year old woman currently of [place] where she have been living recently. She is a pleasant woman with history of endometriosis and menorrhagia for many years. She have reported heavy menstrual cycles since the age of 78. She was found to be iron deficient with a hemoglobin of 11 and iron level of 28 detected in June 2017. At that time her iron saturation was 11%. She was started on oral iron which she has been taking intermittently. Repeat CBC done on 01/31/2016 showed a hemoglobin of 12.9 and on 02/08/2016 her hemoglobin was 10.7. She underwent a hysterectomy on 02/07/2016 for her menorrhagia and uterine fibroids. Since her operation, she have not reported any bleeding. She continues to be on oral iron which she has tolerated very well. She denied any nausea, vomiting or dyspepsia. She denies any constipation or hematochezia. She did not have any melena or epistaxis. Despite her oral iron, she continues to be symptomatic with symptoms of fatigue, tiredness and near syncopal episodes. She denied any chest pain or palpitation.   She does not report any headaches, blurry vision, syncope or seizures. She does not report any fevers or chills or sweats. She does not report any cough, wheezing or hemoptysis. She does not report any nausea or vomiting or abdominal pain. She does not report any frequency urgency or hesitancy. She does not report any skeletal complaints. Remaining review of systems unremarkable.   Past Medical History:  Diagnosis Date  . Anemia    history   . Anxiety   . Back pain    rotates to left hip and tailbone  . Depression   . Endometriosis   . Fractured coccyx ([place])   . Heart murmur    no problems  . History of seasonal allergies   . Hyperlipidemia   . Migraine   . Migraines    tx w/depakote/verapamil per pt  . Murmur, heart   . 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 Post  traumatic stress disorder (PTSD)   . Seizures ([place]) 2004   x 1; unknown source  :  Past Surgical History:  Procedure Laterality Date  . CHOLECYSTECTOMY    . HERNIA REPAIR    . HYSTERECTOMY ABDOMINAL WITH SALPINGECTOMY Left 02/07/2016   Procedure: HYSTERECTOMY ABDOMINAL WITH  left SALPINGECTOMY;  Surgeon: Mora Bellman, MD;  Location: [facility];  Service: Gynecology;  Laterality: Left;  . LAPAROSCOPY  05/16/2011   Procedure: LAPAROSCOPY OPERATIVE;  Surgeon: Emeterio Reeve, MD;  Location: [facility];  Service: Gynecology;  Laterality: N/A;  poss oophorectomy  . LAPAROSCOPY  08/06/2011   Procedure: LAPAROSCOPY OPERATIVE;  Surgeon: Woodroe Mode, MD;  Location: [facility];  Service: Gynecology;  Laterality: N/A;  . LAPAROTOMY     for endometriosis/adhesions  . SALPINGOOPHORECTOMY  08/06/2011   Procedure: SALPINGO OOPHERECTOMY;  Surgeon: Woodroe Mode, MD;  Location: [facility];  Service: Gynecology;  Laterality: Right;  . SVD     x 2  . TUBAL LIGATION    . WISDOM TOOTH EXTRACTION    :   Current Outpatient Prescriptions:  .  buPROPion (WELLBUTRIN XL) 150 MG 24 hr tablet, Take 150 mg by mouth daily., Disp: , Rfl:  .  butalbital-acetaminophen-caffeine (FIORICET, ESGIC) 50-325-40 MG tablet, Take 1 tablet by mouth every 8 (eight) hours as needed for migraine., Disp: , Rfl:  .  cetirizine (ZYRTEC) 10 MG tablet, Take 10 mg by mouth daily as needed  for allergies., Disp: , Rfl:  .  divalproex (DEPAKOTE ER) 500 MG 24 hr tablet, Take 500 mg by mouth daily., Disp: , Rfl:  .  docusate sodium (COLACE) 100 MG capsule, Take 1 capsule (100 mg total) by mouth 2 (two) times daily as needed., Disp: 30 capsule, Rfl: 2 .  fluconazole (DIFLUCAN) 150 MG tablet, Take 150 mg by mouth daily., Disp: , Rfl:  .  gabapentin (NEURONTIN) 300 MG capsule, Take 300 mg by mouth 3 (three) times daily., Disp: , Rfl:  .  hydrOXYzine (VISTARIL) 25 MG capsule, Take 25 mg by mouth 3 (three) times daily as needed for anxiety., Disp: , Rfl:  .   lubiprostone (AMITIZA) 24 MCG capsule, Take 24 mcg by mouth 2 (two) times daily with a meal., Disp: , Rfl:  .  nortriptyline (PAMELOR) 50 MG capsule, Take 100 mg by mouth at bedtime. , Disp: , Rfl:  .  prochlorperazine (COMPAZINE) 10 MG tablet, Take 10 mg by mouth every 6 (six) hours as needed for nausea or vomiting., Disp: , Rfl:  .  promethazine (PROMETHEGAN) 50 MG suppository, Place 50 mg rectally every 8 (eight) hours as needed for nausea or vomiting. , Disp: , Rfl:  .  propranolol ER (INDERAL LA) 120 MG 24 hr capsule, Take 120 mg by mouth daily., Disp: , Rfl:  .  sertraline (ZOLOFT) 100 MG tablet, Take 150 mg by mouth daily., Disp: , Rfl:  .  tiZANidine (ZANAFLEX) 4 MG tablet, Take 4 mg by mouth every 12 (twelve) hours as needed for muscle spasms. , Disp: , Rfl: :  Allergies  Allergen Reactions  . Penicillins Hives, Itching and Other (See Comments)    Has patient had a PCN reaction causing immediate rash, facial/tongue/throat swelling, SOB or lightheadedness with hypotension: No Has patient had a PCN reaction causing severe rash involving mucus membranes or skin necrosis: No Has patient had a PCN reaction that required hospitalization: No Has patient had a PCN reaction occurring within the last 10 years: Yes If all of the above answers are "NO", then may proceed with Cephalosporin use.   . Prednisone Hives and Itching  . Zithromax [Azithromycin] Hives and Itching  :  Family History  Problem Relation Age of Onset  . Migraines Mother   . Heart disease Father   . Migraines Father   . Migraines Daughter   :  Social History   Social History  . Marital status: Legally Separated    Spouse name: N/A  . Number of children: N/A  . Years of education: N/A   Occupational History  . Not on file.   Social History Main Topics  . Smoking status: Never Smoker  . Smokeless tobacco: Never Used  . Alcohol use No  . Drug use: No  . Sexual activity: Not Currently    Birth control/  protection: Surgical   Other Topics Concern  . Not on file   Social History Narrative  . No narrative on file  :  Pertinent items are noted in HPI.  Exam: Blood pressure 114/70, pulse 87, temperature 98.2 F (36.8 C), temperature source Oral, resp. rate 18, height 5\' 11"  (1.803 m), weight 194 lb 6.4 oz (88.2 kg), last menstrual period 01/13/2016, SpO2 100 %. General appearance: alert and cooperative Head: Normocephalic, without obvious abnormality Throat: lips, mucosa, and tongue normal; teeth and gums normal Neck: no adenopathy Back: negative Resp: clear to auscultation bilaterally Chest wall: no tenderness Cardio: regular rate and rhythm, S1, S2 normal, no  murmur, click, rub or gallop GI: soft, non-tender; bowel sounds normal; no masses,  no organomegaly Extremities: extremities normal, atraumatic, no cyanosis or edema Pulses: 2+ and symmetric Skin: Skin color, texture, turgor normal. No rashes or lesions Lymph nodes: Cervical, supraclavicular, and axillary nodes normal.  CBC    Component Value Date/Time   WBC 8.1 02/08/2016 0527   RBC 3.65 (L) 02/08/2016 0527   HGB 10.7 (L) 02/08/2016 0527   HCT 31.3 (L) 02/08/2016 0527   PLT 252 02/08/2016 0527   MCV 85.8 02/08/2016 0527   MCH 29.3 02/08/2016 0527   MCHC 34.2 02/08/2016 0527   RDW 13.6 02/08/2016 0527   LYMPHSABS 2.1 05/16/2014 1816   MONOABS 0.6 05/16/2014 1816   EOSABS 0.2 05/16/2014 1816   BASOSABS 0.0 05/16/2014 1816     Chemistry      Component Value Date/Time   NA 137 05/16/2014 1816   K 4.2 05/16/2014 1816   CL 103 05/16/2014 1816   CO2 27 05/16/2014 1816   BUN 7 05/16/2014 1816   CREATININE 0.73 05/16/2014 1816      Component Value Date/Time   CALCIUM 9.5 05/16/2014 1816   ALKPHOS 52 05/16/2014 1816   AST 16 05/16/2014 1816   ALT 13 05/16/2014 1816   BILITOT 0.2 (L) 05/16/2014 1816       Assessment and Plan:   45 year old woman with the following issues:  1. Iron deficiency anemia  documented on multiple occasions. Her iron levels in June 2017 with 28 with hemoglobin of 11 and saturation of 11%.  She is currently on oral iron and have tolerated it well. Her most recent CBC in October 2017 was close to normal range. My recommendation at this time is to repeat her iron studies and determine her iron stores at this time. If her iron stores continues to be low despite adequate oral replacement, intravenous iron would be used. Risks and benefits of Feraheme infusion were discussed. Complications include arthralgias, myalgias and infusion related complications were discussed. She is agreeable to proceed with this plan if needed to.  I have offered to repeat her iron studies today but she is in a rush to leave and we will do that next week. I will recheck her iron studies in 3 months as well to ensure adequacy.  2. Menorrhagia: Related to uterine fibroids. She status post hysterectomy which should eliminate for persistent iron and blood loss.  3. Follow-up: Will be in 3-4 months to follow her progress.

## 2016-04-05 NOTE — Telephone Encounter (Signed)
Gave patient avs report and appointments for December thru April.

## 2016-04-12 ENCOUNTER — Telehealth: Payer: Self-pay | Admitting: Neurology

## 2016-04-12 ENCOUNTER — Telehealth: Payer: Self-pay | Admitting: *Deleted

## 2016-04-12 DIAGNOSIS — B9689 Other specified bacterial agents as the cause of diseases classified elsewhere: Secondary | ICD-10-CM

## 2016-04-12 DIAGNOSIS — N760 Acute vaginitis: Principal | ICD-10-CM

## 2016-04-12 MED ORDER — METRONIDAZOLE 500 MG PO TABS: 500.0000 mg | ORAL_TABLET | Freq: Two times a day (BID) | ORAL | 0 refills | Status: DC

## 2016-04-12 NOTE — Telephone Encounter (Signed)
Patient wants to talk to someone about medication, it is not working any more and she wants to know if dr Tomi Likens still wants her to still take it please call her at [phone]

## 2016-04-12 NOTE — Telephone Encounter (Signed)
Left message on machine for patient to call back.

## 2016-04-12 NOTE — Telephone Encounter (Signed)
-----   Message from Francia Greaves sent at 04/12/2016 11:33 AM EST ----- Regarding: Rx Request Contact: [phone] Thinks she has BV again [facility]ses [facility] on [address]

## 2016-04-13 ENCOUNTER — Ambulatory Visit: Payer: Medicaid Other

## 2016-04-13 ENCOUNTER — Telehealth: Payer: Self-pay | Admitting: Oncology

## 2016-04-13 NOTE — Telephone Encounter (Signed)
Received note from after hours nursing as follows: "caller states that she knows that she is going to have Botox but needs to quit some of her medications. She is taking depakote, nortriptyline, and propranolol, but they do not work and she wants to know if she needs to keep taking these. She called the office and left message for her doctor but no one called her back. She would like someone to call her tomorrow about this."  I left message for patient to call back to the office yesterday at [phone]. Dr. Tomi Likens please advise on the medications above.

## 2016-04-13 NOTE — Telephone Encounter (Signed)
Spoke with patient and made her aware of Dr. Georgie Chard instructions.

## 2016-04-13 NOTE — Telephone Encounter (Signed)
I would taper off one medication at a time.  Since she is already on 3 antidepressants, I want to taper off nortriptyline first: Take 50mg  in AM and 100mg  at bedtime for one week  Then 50mg  twice daily for one week  Then 50mg  at bedtime for one week  Then stop

## 2016-04-13 NOTE — Telephone Encounter (Signed)
Pt called to r/s today's infusion appt due to not feeling well.

## 2016-04-20 ENCOUNTER — Ambulatory Visit (HOSPITAL_BASED_OUTPATIENT_CLINIC_OR_DEPARTMENT_OTHER): Payer: Medicaid Other

## 2016-04-20 VITALS — BP 121/67 | HR 89 | Temp 98.1°F | Resp 18

## 2016-04-20 DIAGNOSIS — D500 Iron deficiency anemia secondary to blood loss (chronic): Secondary | ICD-10-CM | POA: Diagnosis present

## 2016-04-20 MED ORDER — SODIUM CHLORIDE 0.9 % IV SOLN
Freq: Once | INTRAVENOUS | Status: AC
  Administered 2016-04-20: 15:00:00 via INTRAVENOUS

## 2016-04-20 MED ORDER — SODIUM CHLORIDE 0.9 % IV SOLN
510.0000 mg | Freq: Once | INTRAVENOUS | Status: AC
  Administered 2016-04-20: 510 mg via INTRAVENOUS
  Filled 2016-04-20: qty 17

## 2016-04-20 NOTE — Patient Instructions (Signed)
Ferumoxytol injection What is this medicine? FERUMOXYTOL is an iron complex. Iron is used to make healthy red blood cells, which carry oxygen and nutrients throughout the body. This medicine is used to treat iron deficiency anemia in people with chronic kidney disease. COMMON BRAND NAME(S): Feraheme What should I tell my health care provider before I take this medicine? They need to know if you have any of these conditions: -anemia not caused by low iron levels -high levels of iron in the blood -magnetic resonance imaging (MRI) test scheduled -an unusual or allergic reaction to iron, other medicines, foods, dyes, or preservatives -pregnant or trying to get pregnant -breast-feeding How should I use this medicine? This medicine is for injection into a vein. It is given by a health care professional in a hospital or clinic setting. Talk to your pediatrician regarding the use of this medicine in children. Special care may be needed. What if I miss a dose? It is important not to miss your dose. Call your doctor or health care professional if you are unable to keep an appointment. What may interact with this medicine? This medicine may interact with the following medications: -other iron products What should I watch for while using this medicine? Visit your doctor or healthcare professional regularly. Tell your doctor or healthcare professional if your symptoms do not start to get better or if they get worse. You may need blood work done while you are taking this medicine. You may need to follow a special diet. Talk to your doctor. Foods that contain iron include: whole grains/cereals, dried fruits, beans, or peas, leafy green vegetables, and organ meats (liver, kidney). What side effects may I notice from receiving this medicine? Side effects that you should report to your doctor or health care professional as soon as possible: -allergic reactions like skin rash, itching or hives, swelling of the  face, lips, or tongue -breathing problems -changes in blood pressure -feeling faint or lightheaded, falls -fever or chills -flushing, sweating, or hot feelings -swelling of the ankles or feet Side effects that usually do not require medical attention (report to your doctor or health care professional if they continue or are bothersome): -diarrhea -headache -nausea, vomiting -stomach pain Where should I keep my medicine? This drug is given in a hospital or clinic and will not be stored at home.  2017 Elsevier/Gold Standard (2015-05-05 12:41:49)  

## 2016-04-26 ENCOUNTER — Telehealth: Payer: Self-pay | Admitting: Neurology

## 2016-04-26 ENCOUNTER — Telehealth: Payer: Self-pay | Admitting: *Deleted

## 2016-04-26 ENCOUNTER — Other Ambulatory Visit: Payer: Self-pay | Admitting: Neurology

## 2016-04-26 DIAGNOSIS — D500 Iron deficiency anemia secondary to blood loss (chronic): Secondary | ICD-10-CM

## 2016-04-26 DIAGNOSIS — N898 Other specified noninflammatory disorders of vagina: Secondary | ICD-10-CM

## 2016-04-26 MED ORDER — FLUCONAZOLE 150 MG PO TABS: 150.0000 mg | ORAL_TABLET | Freq: Once | ORAL | 0 refills | Status: AC

## 2016-04-26 NOTE — Telephone Encounter (Signed)
Called pt, states she used a "colorful" condom during intercourse last night and is now experiencing vaginal itching and irritation.  Sent Diflucan to pharmacy and instructed to get OTC Monistat anti-itch cream or hydrocortisone cream to relieve symptoms.

## 2016-04-26 NOTE — Telephone Encounter (Signed)
Patient needs a refill on [facility]akote [facility] [address] and she would like to talk to someone about the botox please cal [phone]

## 2016-04-26 NOTE — Telephone Encounter (Signed)
Returned call. No answer.  

## 2016-04-26 NOTE — Telephone Encounter (Signed)
-----   Message from Darcella Cheshire sent at 04/26/2016  4:05 PM EST ----- Regarding: call patient  Patient would like to talk to you about having problems with vaginal irritation.    Thanks

## 2016-04-27 ENCOUNTER — Ambulatory Visit (HOSPITAL_BASED_OUTPATIENT_CLINIC_OR_DEPARTMENT_OTHER): Payer: Medicaid Other

## 2016-04-27 VITALS — BP 102/58 | HR 84 | Temp 97.9°F | Resp 18

## 2016-04-27 DIAGNOSIS — D500 Iron deficiency anemia secondary to blood loss (chronic): Secondary | ICD-10-CM

## 2016-04-27 MED ORDER — FERUMOXYTOL INJECTION 510 MG/17 ML
510.0000 mg | Freq: Once | INTRAVENOUS | Status: AC
  Administered 2016-04-27: 510 mg via INTRAVENOUS
  Filled 2016-04-27: qty 17

## 2016-04-27 MED ORDER — SODIUM CHLORIDE 0.9 % IV SOLN
Freq: Once | INTRAVENOUS | Status: AC
  Administered 2016-04-27: 15:00:00 via INTRAVENOUS

## 2016-04-27 MED ORDER — DIVALPROEX SODIUM ER 500 MG PO TB24: 500.0000 mg | ORAL_TABLET | Freq: Every day | ORAL | 2 refills | Status: DC

## 2016-04-27 NOTE — Telephone Encounter (Signed)
Sent Rx for Depakote per Dr. Tomi Likens. Called patient. No answer.

## 2016-04-27 NOTE — Patient Instructions (Signed)
Ferumoxytol injection What is this medicine? FERUMOXYTOL is an iron complex. Iron is used to make healthy red blood cells, which carry oxygen and nutrients throughout the body. This medicine is used to treat iron deficiency anemia in people with chronic kidney disease. COMMON BRAND NAME(S): Feraheme What should I tell my health care provider before I take this medicine? They need to know if you have any of these conditions: -anemia not caused by low iron levels -high levels of iron in the blood -magnetic resonance imaging (MRI) test scheduled -an unusual or allergic reaction to iron, other medicines, foods, dyes, or preservatives -pregnant or trying to get pregnant -breast-feeding How should I use this medicine? This medicine is for injection into a vein. It is given by a health care professional in a hospital or clinic setting. Talk to your pediatrician regarding the use of this medicine in children. Special care may be needed. What if I miss a dose? It is important not to miss your dose. Call your doctor or health care professional if you are unable to keep an appointment. What may interact with this medicine? This medicine may interact with the following medications: -other iron products What should I watch for while using this medicine? Visit your doctor or healthcare professional regularly. Tell your doctor or healthcare professional if your symptoms do not start to get better or if they get worse. You may need blood work done while you are taking this medicine. You may need to follow a special diet. Talk to your doctor. Foods that contain iron include: whole grains/cereals, dried fruits, beans, or peas, leafy green vegetables, and organ meats (liver, kidney). What side effects may I notice from receiving this medicine? Side effects that you should report to your doctor or health care professional as soon as possible: -allergic reactions like skin rash, itching or hives, swelling of the  face, lips, or tongue -breathing problems -changes in blood pressure -feeling faint or lightheaded, falls -fever or chills -flushing, sweating, or hot feelings -swelling of the ankles or feet Side effects that usually do not require medical attention (report to your doctor or health care professional if they continue or are bothersome): -diarrhea -headache -nausea, vomiting -stomach pain Where should I keep my medicine? This drug is given in a hospital or clinic and will not be stored at home.  2017 Elsevier/Gold Standard (2015-05-05 12:41:49)  

## 2016-04-27 NOTE — Telephone Encounter (Signed)
She is currently tapering off of nortriptyline I want her to continue depakote for now.

## 2016-04-27 NOTE — Progress Notes (Signed)
Pt tolerated Feraheme infusion well. Pt monitored 30 minutes post infusion, pt and VS stable.  Pt reports pain at PIV site. Pt states that she believes that is location of PIV and sis not want PIV changed to a new spot, PIV has good blood return, no redness or swelling noted. Therapy completed. And PIV removed.

## 2016-04-30 MED ORDER — NORTRIPTYLINE HCL 50 MG PO CAPS: ORAL_CAPSULE | ORAL | 0 refills | Status: DC

## 2016-04-30 NOTE — Telephone Encounter (Signed)
Resent Rx to [facility] per patient's request. Called and cancld Depakote Rx sent to [facility]. Patient still tapering off of Nortriptyline as directed.

## 2016-04-30 NOTE — Telephone Encounter (Signed)
Spoke to patient.  She requested meds sent to other pharmacy on file. ([facility]). Sent meds. Patient still tapering off of Nortriptyline as directed.

## 2016-05-17 ENCOUNTER — Telehealth: Payer: Self-pay | Admitting: *Deleted

## 2016-05-17 DIAGNOSIS — N760 Acute vaginitis: Principal | ICD-10-CM

## 2016-05-17 DIAGNOSIS — B9689 Other specified bacterial agents as the cause of diseases classified elsewhere: Secondary | ICD-10-CM

## 2016-05-17 MED ORDER — METRONIDAZOLE 500 MG PO TABS: 500.0000 mg | ORAL_TABLET | Freq: Two times a day (BID) | ORAL | 0 refills | Status: DC

## 2016-05-17 NOTE — Telephone Encounter (Signed)
Pt called c/o vaginal odor, has a history of chronic BV infections.  Sent Flagyl to pharmacy and reviewed preventative measures with the patient.  Pt states she bathes 2-3 times daily, informed pt that the frequent bathing could be contributing to her frequent infections and to limit to one shower per day.  Pt acknowledged.

## 2016-05-31 ENCOUNTER — Telehealth: Payer: Self-pay | Admitting: *Deleted

## 2016-05-31 DIAGNOSIS — B373 Candidiasis of vulva and vagina: Secondary | ICD-10-CM

## 2016-05-31 DIAGNOSIS — B3731 Acute candidiasis of vulva and vagina: Secondary | ICD-10-CM

## 2016-05-31 MED ORDER — FLUCONAZOLE 150 MG PO TABS: 150.0000 mg | ORAL_TABLET | Freq: Once | ORAL | 0 refills | Status: AC

## 2016-05-31 NOTE — Telephone Encounter (Signed)
Pt is currently taking Flagyl for BV, states she is now having a discharge and vaginal burning consistent with yeast, sent Diflucan to the pharmacy.  Informed pt that she would need to come in for evaluation if symptoms continue or return after treatment.

## 2016-05-31 NOTE — Telephone Encounter (Signed)
-----   Message from Blanchie Dessert, [place] sent at 05/31/2016  1:47 PM EST ----- Regarding: Difulcan Rx request Contact: [phone] Pt requesting that a Rx for [facility]lucan be [facility] into [facility] on [address]  thanks

## 2016-06-01 ENCOUNTER — Other Ambulatory Visit: Payer: Self-pay | Admitting: Obstetrics & Gynecology

## 2016-07-09 ENCOUNTER — Encounter (HOSPITAL_COMMUNITY): Payer: Self-pay

## 2016-07-09 ENCOUNTER — Ambulatory Visit: Payer: Medicaid Other

## 2016-07-17 ENCOUNTER — Telehealth: Payer: Self-pay | Admitting: *Deleted

## 2016-07-17 DIAGNOSIS — B373 Candidiasis of vulva and vagina: Secondary | ICD-10-CM

## 2016-07-17 DIAGNOSIS — B3731 Acute candidiasis of vulva and vagina: Secondary | ICD-10-CM

## 2016-07-17 MED ORDER — FLUCONAZOLE 150 MG PO TABS: 150.0000 mg | ORAL_TABLET | Freq: Once | ORAL | 0 refills | Status: AC

## 2016-07-17 NOTE — Telephone Encounter (Signed)
Pt states she had a procedure at the dentist office and was on antibiotics and is now having a white discharge and itching related to a yeast infection due to antibiotic use. Diflucan sent to pharmacy and instructed pt on medication use.  If symptoms persist after treatment, pt is to schedule an appt for evaluation.

## 2016-07-17 NOTE — Telephone Encounter (Signed)
-----   Message from Blanchie Dessert, [place] sent at 07/17/2016  1:06 PM EDT ----- Regarding: pt is reqesting for nurse call back Contact: [phone] Requesting a nurse call back

## 2016-08-01 ENCOUNTER — Ambulatory Visit: Payer: Medicaid Other

## 2016-08-02 ENCOUNTER — Other Ambulatory Visit: Payer: Medicaid Other

## 2016-08-02 ENCOUNTER — Ambulatory Visit: Payer: Medicaid Other | Admitting: Oncology

## 2016-08-07 ENCOUNTER — Other Ambulatory Visit: Payer: Self-pay | Admitting: Neurology

## 2016-08-16 ENCOUNTER — Other Ambulatory Visit (HOSPITAL_COMMUNITY)
Admission: RE | Admit: 2016-08-16 | Discharge: 2016-08-16 | Disposition: A | Discharge status: Home / Self Care | Payer: Medicaid Other | Source: Ambulatory Visit | Attending: Obstetrics and Gynecology | Admitting: Obstetrics and Gynecology

## 2016-08-16 ENCOUNTER — Encounter: Payer: Self-pay | Admitting: Obstetrics and Gynecology

## 2016-08-16 ENCOUNTER — Ambulatory Visit (INDEPENDENT_AMBULATORY_CARE_PROVIDER_SITE_OTHER): Payer: Medicaid Other | Admitting: Obstetrics and Gynecology

## 2016-08-16 VITALS — BP 124/76 | HR 79 | Wt 193.0 lb

## 2016-08-16 DIAGNOSIS — Z202 Contact with and (suspected) exposure to infections with a predominantly sexual mode of transmission: Secondary | ICD-10-CM | POA: Diagnosis not present

## 2016-08-16 NOTE — Progress Notes (Signed)
Pt here for vaginal exam. She states she was having intercourse last night and her partners condom broke and she thinks it may still be inside vagina. She states she is having some vaginal irritation.

## 2016-08-16 NOTE — Progress Notes (Signed)
Obstetrics and Gynecology Established Patient Problem Visit  Appointment Date: 08/16/2016  OBGYN Clinic: [facility]-  Primary Care Provider: Barbette Merino  Referring Provider: [name], MD  Chief Complaint: condom broke last night, concern for piece left inside History of Present Illness: Christine Vega is a 46 y.o. African-American [id number] (LMP: s/p hyst), seen for the above chief complaint.  No d/c, VB, dysuria, abdominal pain, fevers, chills. She states that she believes that the condom looked intact when she examined it afterward     Review of Systems:  as noted in the History of Present Illness.  Past Medical History:  Past Medical History:  Diagnosis Date  . Anemia    history   . Anxiety   . Back pain    rotates to left hip and tailbone  . Depression   . Endometriosis   . Fractured coccyx ([place])   . Heart murmur    no problems  . History of seasonal allergies   . Hyperlipidemia   . Migraine   . Migraines    tx w/depakote/verapamil per pt  . Murmur, heart   . 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 Post traumatic stress disorder (PTSD)   . Seizures ([place]) 2004   x 1; unknown source    Past Surgical History:  Past Surgical History:  Procedure Laterality Date  . CHOLECYSTECTOMY    . HERNIA REPAIR    . HYSTERECTOMY ABDOMINAL WITH SALPINGECTOMY Left 02/07/2016   Procedure: HYSTERECTOMY ABDOMINAL WITH  left SALPINGECTOMY;  Surgeon: Mora Bellman, MD;  Location: [facility];  Service: Gynecology;  Laterality: Left;  . LAPAROSCOPY  05/16/2011   Procedure: LAPAROSCOPY OPERATIVE;  Surgeon: Emeterio Reeve, MD;  Location: [facility];  Service: Gynecology;  Laterality: N/A;  poss oophorectomy  . LAPAROSCOPY  08/06/2011   Procedure: LAPAROSCOPY OPERATIVE;  Surgeon: Woodroe Mode, MD;  Location: [facility];  Service: Gynecology;  Laterality: N/A;  . LAPAROTOMY     for endometriosis/adhesions  . SALPINGOOPHORECTOMY   08/06/2011   Procedure: SALPINGO OOPHERECTOMY;  Surgeon: Woodroe Mode, MD;  Location: [facility];  Service: Gynecology;  Laterality: Right;  . SVD     x 2  . TUBAL LIGATION    . WISDOM TOOTH EXTRACTION      Past Obstetrical History:  OB History  Gravida Para Term Preterm AB Living  2 2 2  0 0 2  SAB TAB Ectopic Multiple Live Births  0 0 0 0 2    # Outcome Date GA Lbr Len/2nd Weight Sex Delivery Anes PTL Lv  2 Term      Vag-Spont   LIV  1 Term      Vag-Spont   LIV     Past Gynecological History: As per HPI.   Social History:  Social History   Social History  . Marital status: Legally Separated    Spouse name: N/A  . Number of children: N/A  . Years of education: N/A   Occupational History  . Not on file.   Social History Main Topics  . Smoking status: Never Smoker  . Smokeless tobacco: Never Used  . Alcohol use No  . Drug use: No  . Sexual activity: Not Currently    Birth control/ protection: Surgical   Other Topics Concern  . Not on file   Social History Narrative  . No narrative on file    Family History:  Family History  Problem Relation Age of  Onset  . Migraines Mother   . Heart disease Father   . Migraines Father   . Migraines Daughter     Medications Ms. Selvage had no medications administered during this visit. Current Outpatient Prescriptions  Medication Sig Dispense Refill  . buPROPion (WELLBUTRIN XL) 150 MG 24 hr tablet Take 150 mg by mouth daily.    . butalbital-acetaminophen-caffeine (FIORICET, ESGIC) 50-325-40 MG tablet Take 1 tablet by mouth every 8 (eight) hours as needed for migraine.    . cetirizine (ZYRTEC) 10 MG tablet Take 10 mg by mouth daily as needed for allergies.    Marland Kitchen divalproex (DEPAKOTE) 500 MG DR tablet TAKE 2 TABLETS BY MOUTH TWICE DAILY 120 tablet 2  . docusate sodium (COLACE) 100 MG capsule Take 1 capsule (100 mg total) by mouth 2 (two) times daily as needed. 30 capsule 2  . fluconazole (DIFLUCAN) 150 MG tablet TAKE 1 TABLET  BY MOUTH ONCE; MAY TAKE ADDITIONAL DOSE 3 DAYS LATER IF SYMPTOMS PERSIST 1 tablet 2  . gabapentin (NEURONTIN) 300 MG capsule Take 300 mg by mouth 3 (three) times daily.    Marland Kitchen HYDROcodone-acetaminophen (NORCO/VICODIN) 5-325 MG tablet TK 1 T PO Q 6 H  0  . hydrOXYzine (VISTARIL) 25 MG capsule Take 25 mg by mouth 3 (three) times daily as needed for anxiety.    Marland Kitchen lubiprostone (AMITIZA) 24 MCG capsule Take 24 mcg by mouth 2 (two) times daily with a meal.    . nortriptyline (PAMELOR) 50 MG capsule TAKE 2 CAPSULES BY MOUTH NIGHTLY 30 capsule 0  . prochlorperazine (COMPAZINE) 10 MG tablet Take 10 mg by mouth every 6 (six) hours as needed for nausea or vomiting.    . promethazine (PROMETHEGAN) 50 MG suppository Place 50 mg rectally every 8 (eight) hours as needed for nausea or vomiting.     . propranolol ER (INDERAL LA) 120 MG 24 hr capsule Take 120 mg by mouth daily.    . simvastatin (ZOCOR) 20 MG tablet TK 1 T PO D  1  . tiZANidine (ZANAFLEX) 4 MG tablet Take 4 mg by mouth every 12 (twelve) hours as needed for muscle spasms.      No current facility-administered medications for this visit.     Allergies Penicillins; Prednisone; and Zithromax [azithromycin]   Physical Exam:  BP 124/76   Pulse 79   Wt 193 lb (87.5 kg)   LMP 01/13/2016 (Exact Date)   BMI 26.92 kg/m  Body mass index is 26.92 kg/m. General appearance: Well nourished, well developed female in no acute distress.  Neck:  Supple, normal appearance, and no thyromegaly  Cardiovascular: normal s1 and s2.  No murmurs, rubs or gallops. Respiratory:  Clear to auscultation bilateral. Normal respiratory effort Abdomen: positive bowel sounds and no masses, hernias; diffusely non tender to palpation, non distended. Well healed surgical scars. Neuro/Psych:  Normal mood and affect.  Skin:  Warm and dry.  Lymphatic:  No inguinal lymphadenopathy.   Pelvic exam: is not limited by body habitus EGBUS: within normal limits, Vagina: within normal  limits and with no blood or discharge in the vault, and no e/o retained condom.  Cervix: surgically absent Uterus:  Surgically absent and non tender and Adnexa:  normal adnexa and no mass, fullness, tenderness Rectovaginal: deferred  Laboratory: none  Radiology: none  Assessment: pt doing well  Plan:  1. Possible exposure to STD Routine care. Pt would like STD swab.  - Cervicovaginal ancillary only  RTC PRN  Durene Romans MD Attending  [facility] [employer] [facility])

## 2016-08-17 ENCOUNTER — Ambulatory Visit: Payer: Medicaid Other

## 2016-08-17 LAB — CERVICOVAGINAL ANCILLARY ONLY
Chlamydia: NEGATIVE
Neisseria Gonorrhea: NEGATIVE
Trichomonas: NEGATIVE

## 2016-08-21 ENCOUNTER — Telehealth: Payer: Self-pay | Admitting: *Deleted

## 2016-08-21 DIAGNOSIS — B379 Candidiasis, unspecified: Secondary | ICD-10-CM

## 2016-08-21 DIAGNOSIS — T3695XA Adverse effect of unspecified systemic antibiotic, initial encounter: Principal | ICD-10-CM

## 2016-08-21 MED ORDER — FLUCONAZOLE 150 MG PO TABS: 150.0000 mg | ORAL_TABLET | Freq: Once | ORAL | 0 refills | Status: AC

## 2016-08-21 NOTE — Telephone Encounter (Signed)
Informed pt of normal result, pt states she was on an antibiotic for dental work that she had done last week and feels she has a yeast infection.  Diflucan sent to the pharmacy.

## 2016-08-21 NOTE — Telephone Encounter (Signed)
-----   Message from Blanchie Dessert, [place] sent at 08/21/2016  9:58 AM EDT ----- Regarding: needing test results Contact: [phone] Pt would like to know test results

## 2016-08-23 ENCOUNTER — Ambulatory Visit: Payer: Medicaid Other

## 2016-08-27 ENCOUNTER — Ambulatory Visit: Payer: Medicaid Other

## 2016-08-30 ENCOUNTER — Other Ambulatory Visit (HOSPITAL_BASED_OUTPATIENT_CLINIC_OR_DEPARTMENT_OTHER): Payer: Medicaid Other

## 2016-08-30 ENCOUNTER — Ambulatory Visit (HOSPITAL_BASED_OUTPATIENT_CLINIC_OR_DEPARTMENT_OTHER): Payer: Medicaid Other | Admitting: Oncology

## 2016-08-30 ENCOUNTER — Telehealth: Payer: Self-pay | Admitting: Oncology

## 2016-08-30 VITALS — BP 141/66 | HR 84 | Temp 98.6°F | Resp 18 | Ht 71.0 in | Wt 195.3 lb

## 2016-08-30 DIAGNOSIS — D500 Iron deficiency anemia secondary to blood loss (chronic): Secondary | ICD-10-CM

## 2016-08-30 LAB — CBC WITH DIFFERENTIAL/PLATELET
BASO%: 0.5 % (ref 0.0–2.0)
Basophils Absolute: 0 10*3/uL (ref 0.0–0.1)
EOS%: 0.5 % (ref 0.0–7.0)
Eosinophils Absolute: 0 10*3/uL (ref 0.0–0.5)
HCT: 39 % (ref 34.8–46.6)
HGB: 12.9 g/dL (ref 11.6–15.9)
LYMPH%: 27 % (ref 14.0–49.7)
MCH: 30 pg (ref 25.1–34.0)
MCHC: 33.1 g/dL (ref 31.5–36.0)
MCV: 90.6 fL (ref 79.5–101.0)
MONO#: 0.5 10*3/uL (ref 0.1–0.9)
MONO%: 10.5 % (ref 0.0–14.0)
NEUT#: 3.2 10*3/uL (ref 1.5–6.5)
NEUT%: 61.5 % (ref 38.4–76.8)
Platelets: 283 10*3/uL (ref 145–400)
RBC: 4.31 10*6/uL (ref 3.70–5.45)
RDW: 12.8 % (ref 11.2–14.5)
WBC: 5.2 10*3/uL (ref 3.9–10.3)
lymph#: 1.4 10*3/uL (ref 0.9–3.3)

## 2016-08-30 NOTE — Telephone Encounter (Signed)
Gave patient AVS and calender per 5/17 los.  

## 2016-08-30 NOTE — Progress Notes (Signed)
Hematology and Oncology Follow Up Visit  Christine Vega [id number] [birth year] 46 y.o. 08/30/2016 4:03 PM Christine Vega, MDGarba, Christine Newcomer, MD   Principle Diagnosis: 46 year old with iron deficiency anemia related to menorrhagia and menstrual blood losses. This was diagnosed in June 2017. Her hemoglobin was 11, iron 28 saturation 11%.   Prior Therapy: She is status post Feraheme infusion for a total of 1000 mg completed in January 2018.  Current therapy: Oral iron replacement.  Interim History: Christine Vega presents today for a follow-up visit. Since the last visit, she received intravenous iron and noticed significant improvement in her health. She denied any recent hematochezia or melena. She is no longer reporting menorrhagia. She underwent a hysterectomy in October 2017. She noticed a recent symptoms of slight fatigue and skin changes similar to her previous time when she was diagnosed with iron deficiency. Although she still performs activities of daily living without any recent decline.  She does not report any headaches, blurry vision, syncope or seizures. She does not report any fevers or chills or sweats. She does not report any cough, wheezing or hemoptysis. She does not report any nausea or vomiting or abdominal pain. She does not report any frequency urgency or hesitancy. She does not report any skeletal complaints. Remaining review of systems unremarkable.    Medications: I have reviewed the patient's current medications.  Current Outpatient Prescriptions  Medication Sig Dispense Refill  . buPROPion (WELLBUTRIN XL) 150 MG 24 hr tablet Take 150 mg by mouth daily.    . butalbital-acetaminophen-caffeine (FIORICET, ESGIC) 50-325-40 MG tablet Take 1 tablet by mouth every 8 (eight) hours as needed for migraine.    . cetirizine (ZYRTEC) 10 MG tablet Take 10 mg by mouth daily as needed for allergies.    Marland Kitchen divalproex (DEPAKOTE) 500 MG DR tablet TAKE 2 TABLETS BY MOUTH TWICE DAILY 120  tablet 2  . docusate sodium (COLACE) 100 MG capsule Take 1 capsule (100 mg total) by mouth 2 (two) times daily as needed. 30 capsule 2  . gabapentin (NEURONTIN) 300 MG capsule Take 300 mg by mouth 3 (three) times daily.    Marland Kitchen HYDROcodone-acetaminophen (NORCO/VICODIN) 5-325 MG tablet TK 1 T PO Q 6 H  0  . hydrOXYzine (VISTARIL) 25 MG capsule Take 25 mg by mouth 3 (three) times daily as needed for anxiety.    Marland Kitchen lubiprostone (AMITIZA) 24 MCG capsule Take 24 mcg by mouth 2 (two) times daily with a meal.    . nortriptyline (PAMELOR) 50 MG capsule TAKE 2 CAPSULES BY MOUTH NIGHTLY 30 capsule 0  . prochlorperazine (COMPAZINE) 10 MG tablet Take 10 mg by mouth every 6 (six) hours as needed for nausea or vomiting.    . promethazine (PROMETHEGAN) 50 MG suppository Place 50 mg rectally every 8 (eight) hours as needed for nausea or vomiting.     . propranolol ER (INDERAL LA) 120 MG 24 hr capsule Take 120 mg by mouth daily.    . simvastatin (ZOCOR) 20 MG tablet TK 1 T PO D  1  . tiZANidine (ZANAFLEX) 4 MG tablet Take 4 mg by mouth every 12 (twelve) hours as needed for muscle spasms.      No current facility-administered medications for this visit.      Allergies:  Allergies  Allergen Reactions  . Penicillins Hives, Itching and Other (See Comments)    Has patient had a PCN reaction causing immediate rash, facial/tongue/throat swelling, SOB or lightheadedness with hypotension: No Has patient had a PCN  reaction causing severe rash involving mucus membranes or skin necrosis: No Has patient had a PCN reaction that required hospitalization: No Has patient had a PCN reaction occurring within the last 10 years: Yes If all of the above answers are "NO", then may proceed with Cephalosporin use.   . Prednisone Hives and Itching  . Zithromax [Azithromycin] Hives and Itching    Past Medical History, Surgical history, Social history, and Family History were reviewed and updated.   Physical Exam: Blood pressure  (!) 141/66, pulse 84, temperature 98.6 F (37 C), temperature source Oral, resp. rate 18, height 5\' 11"  (1.803 m), weight 195 lb 4.8 oz (88.6 kg), last menstrual period 01/13/2016, SpO2 100 %. ECOG:0 General appearance: alert and cooperative Head: Normocephalic, without obvious abnormality Neck: no adenopathy Lymph nodes: Cervical, supraclavicular, and axillary nodes normal. Heart:regular rate and rhythm, S1, S2 normal, no murmur, click, rub or gallop Lung:chest clear, no wheezing, rales, normal symmetric air entry. Abdomin: soft, non-tender, without masses or organomegaly EXT:no erythema, induration, or nodules   Lab Results: Lab Results  Component Value Date   WBC 5.2 08/30/2016   HGB 12.9 08/30/2016   HCT 39.0 08/30/2016   MCV 90.6 08/30/2016   PLT 283 08/30/2016     Chemistry      Component Value Date/Time   NA 137 05/16/2014 1816   K 4.2 05/16/2014 1816   CL 103 05/16/2014 1816   CO2 27 05/16/2014 1816   BUN 7 05/16/2014 1816   CREATININE 0.73 05/16/2014 1816      Component Value Date/Time   CALCIUM 9.5 05/16/2014 1816   ALKPHOS 52 05/16/2014 1816   AST 16 05/16/2014 1816   ALT 13 05/16/2014 1816   BILITOT 0.2 (L) 05/16/2014 1816        Impression and Plan:   46 year old woman with the following issues:  1. Iron deficiency anemia documented on multiple occasions. Her iron levels in June 2017 with 28 with hemoglobin of 11 and saturation of 11%.  She is status post Feraheme infusion in January 2018 with significant improvement in her symptoms as well as normalization of her hemoglobin.  The plan is to continue to monitor iron studies and replace intravenously as needed. She does take oral iron which has been ineffective previously.  2. Follow-up: Will be in 4 months to repeat iron studies and replace iron as needed.    [facility], MD 5/17/20184:03 PM

## 2016-08-31 LAB — IRON AND TIBC
%SAT: 23 % (ref 21–57)
Iron: 77 ug/dL (ref 41–142)
TIBC: 338 ug/dL (ref 236–444)
UIBC: 261 ug/dL (ref 120–384)

## 2016-08-31 LAB — FERRITIN: Ferritin: 500 ng/ml — ABNORMAL HIGH (ref 9–269)

## 2016-09-03 ENCOUNTER — Telehealth: Payer: Self-pay | Admitting: Neurology

## 2016-09-03 ENCOUNTER — Other Ambulatory Visit: Payer: Self-pay | Admitting: Neurology

## 2016-09-03 MED ORDER — DIVALPROEX SODIUM 500 MG PO DR TAB: 1000.0000 mg | DELAYED_RELEASE_TABLET | Freq: Two times a day (BID) | ORAL | 2 refills | Status: DC

## 2016-09-03 MED ORDER — NORTRIPTYLINE HCL 50 MG PO CAPS: ORAL_CAPSULE | ORAL | 0 refills | Status: DC
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
## 2016-09-03 NOTE — Telephone Encounter (Signed)
Spoke with patient, she is requesting a refill on Nortriptyline and Depakote. Looks like she was supposed to be tapering off but she has still been taking both. She states it is not working and would like to be set up for botox clinic. I spoke to our scheduling about getting her insurance approved for this and hopefully getting her in for the June 15th botox clinic/   Please inform correct dosage of medication and I will send in the refills if necessary.   Thanks!

## 2016-09-03 NOTE — Telephone Encounter (Signed)
We can refill depakote but I still want her to taper off of nortriptyline:  Take 50mg  in AM and 100mg  at bedtime for one week             Then 50mg  twice daily for one week             Then 50mg  at bedtime for one week             Then stop Let's try to get pre-authorization for Botox

## 2016-09-03 NOTE — Telephone Encounter (Signed)
Patient states she has been taking the nortriptyline 1 capsule at bedtime and I informed her to just continue for one more week then to stop. Sent in refill for Depakote.   Patient expressed understanding.

## 2016-09-07 ENCOUNTER — Ambulatory Visit (HOSPITAL_BASED_OUTPATIENT_CLINIC_OR_DEPARTMENT_OTHER): Payer: Medicaid Other

## 2016-09-07 VITALS — BP 106/54 | HR 87 | Temp 98.6°F | Resp 18

## 2016-09-07 DIAGNOSIS — D509 Iron deficiency anemia, unspecified: Secondary | ICD-10-CM

## 2016-09-07 DIAGNOSIS — D500 Iron deficiency anemia secondary to blood loss (chronic): Secondary | ICD-10-CM

## 2016-09-07 MED ORDER — SODIUM CHLORIDE 0.9 % IV SOLN
510.0000 mg | Freq: Once | INTRAVENOUS | Status: AC
  Administered 2016-09-07: 510 mg via INTRAVENOUS
  Filled 2016-09-07: qty 17

## 2016-09-07 MED ORDER — SODIUM CHLORIDE 0.9 % IV SOLN
Freq: Once | INTRAVENOUS | Status: AC
  Administered 2016-09-07: 15:00:00 via INTRAVENOUS

## 2016-09-07 NOTE — Patient Instructions (Signed)

## 2016-09-07 NOTE — Progress Notes (Signed)
Pt tolerated infusion well. Pt monitored 30 minutes post infusion. Pt and VS stable at discharge.  

## 2016-09-11 ENCOUNTER — Inpatient Hospital Stay: Admission: RE | Admit: 2016-09-11 | Payer: Medicaid Other | Source: Ambulatory Visit

## 2016-09-11 ENCOUNTER — Other Ambulatory Visit: Payer: Self-pay | Admitting: Obstetrics & Gynecology

## 2016-09-11 DIAGNOSIS — Z1231 Encounter for screening mammogram for malignant neoplasm of breast: Secondary | ICD-10-CM

## 2016-09-12 ENCOUNTER — Ambulatory Visit: Payer: Medicaid Other

## 2016-09-21 ENCOUNTER — Ambulatory Visit: Payer: Medicaid Other

## 2016-10-02 ENCOUNTER — Telehealth: Payer: Self-pay | Admitting: Neurology

## 2016-10-02 NOTE — Telephone Encounter (Signed)
Manuela Schwartz do you know where this patient is in the Botox process?

## 2016-10-02 NOTE — Telephone Encounter (Signed)
Caller: Mardene Celeste  Urgent? Yes  Reason for the call: She said she has been waiting to hear back regarding Botox Pre Auth. She said her headaches are really bad. She has one today that she feels she may have to go to the ER for. She would like someone to call her regarding getting the Botox started. Please see Dr. Georgie Chard last phone note from 09/03/16. Thanks

## 2016-10-02 NOTE — Telephone Encounter (Signed)
She also said that the Depakote has not been helping. Thanks

## 2016-10-04 ENCOUNTER — Inpatient Hospital Stay: Admission: RE | Admit: 2016-10-04 | Payer: Medicaid Other | Source: Ambulatory Visit

## 2016-10-10 ENCOUNTER — Telehealth: Payer: Self-pay | Admitting: *Deleted

## 2016-10-10 DIAGNOSIS — N760 Acute vaginitis: Principal | ICD-10-CM

## 2016-10-10 DIAGNOSIS — B9689 Other specified bacterial agents as the cause of diseases classified elsewhere: Secondary | ICD-10-CM

## 2016-10-10 MED ORDER — ONABOTULINUMTOXINA 100 UNITS IJ SOLR: 200.0000 [IU] | INTRAMUSCULAR | 1 refills | Status: DC

## 2016-10-10 NOTE — Telephone Encounter (Signed)
-----   Message from Blanchie Dessert, [place] sent at 10/10/2016 11:59 AM EDT ----- Regarding: Rx for BV (pill) and Diflucan  Contact: [phone] Please call in Rx for BV and diflucan in [facility] on [address]

## 2016-10-11 MED ORDER — METRONIDAZOLE 500 MG PO TABS: 500.0000 mg | ORAL_TABLET | Freq: Two times a day (BID) | ORAL | 0 refills | Status: DC

## 2016-10-11 MED ORDER — FLUCONAZOLE 150 MG PO TABS: 150.0000 mg | ORAL_TABLET | Freq: Once | ORAL | 0 refills | Status: AC

## 2016-10-11 NOTE — Telephone Encounter (Signed)
Pt called the office c/o a vaginal discharge with odor.  States she changed her body soap and started experiencing the discharge and had BV in the past and states symptoms are similar to BV.  Flagyl sent to the pharmacy and Diflucan sent for prophylactic use bc pt states after taking Flagyl she gets a yeast infection.  Instructed pt on medication use and that she will need to come in to the office for evaluation if her symptoms return.  Pt acknowledged.

## 2016-10-11 NOTE — Telephone Encounter (Signed)
-----   Message from Blanchie Dessert, [place] sent at 10/10/2016 11:59 AM EDT ----- Regarding: Rx for BV (pill) and Diflucan  Contact: [phone] Please call in Rx for BV and diflucan in [facility] on [address]

## 2016-10-15 ENCOUNTER — Telehealth: Payer: Self-pay | Admitting: Neurology

## 2016-10-15 ENCOUNTER — Ambulatory Visit: Payer: Medicaid Other

## 2016-10-15 NOTE — Telephone Encounter (Signed)
Received voicemail from [facility]  On Dr. Georgie Chard patient that they need a call with injection site of Botox and ICD 10 code. Phone#[phone] Fax#[phone].

## 2016-10-18 ENCOUNTER — Ambulatory Visit: Payer: Medicaid Other

## 2016-10-24 ENCOUNTER — Ambulatory Visit: Payer: Medicaid Other

## 2016-11-08 ENCOUNTER — Telehealth: Payer: Self-pay | Admitting: Neurology

## 2016-11-08 ENCOUNTER — Ambulatory Visit: Payer: Medicaid Other | Admitting: Neurology

## 2016-11-08 NOTE — Telephone Encounter (Signed)
Brandy- please schedule patient for next available Botox day.

## 2016-11-08 NOTE — Telephone Encounter (Signed)
Done

## 2016-11-08 NOTE — Telephone Encounter (Signed)
This was a work in patient.  Please advise when you want to see her?

## 2016-11-08 NOTE — Telephone Encounter (Signed)
Next available?

## 2016-11-08 NOTE — Telephone Encounter (Signed)
Caller: Mardene Celeste   Urgent? No  Reason for the call: She is calling because she missed her Botox injection today. She said she has had a death in the family and that she had a migraine today. She forgot about the appointment. When could she be rescheduled? Please Advise. Thanks

## 2016-11-09 ENCOUNTER — Encounter: Payer: Self-pay | Admitting: Neurology

## 2016-11-12 ENCOUNTER — Other Ambulatory Visit: Payer: Medicaid Other | Admitting: *Deleted

## 2016-11-12 ENCOUNTER — Other Ambulatory Visit (HOSPITAL_COMMUNITY)
Admission: RE | Admit: 2016-11-12 | Discharge: 2016-11-12 | Disposition: A | Discharge status: Home / Self Care | Payer: Medicaid Other | Source: Ambulatory Visit | Attending: Obstetrics and Gynecology | Admitting: Obstetrics and Gynecology

## 2016-11-12 DIAGNOSIS — N898 Other specified noninflammatory disorders of vagina: Secondary | ICD-10-CM | POA: Diagnosis present

## 2016-11-12 NOTE — Progress Notes (Signed)
SUBJECTIVE:  46 y.o. female complains of white vaginal discharge for 3 day(s). Denies abnormal vaginal bleeding or significant pelvic pain or fever. No UTI symptoms. Denies history of known exposure to STD.  Patient's last menstrual period was 01/13/2016 (exact date).  OBJECTIVE:  She appears well, afebrile. Urine dipstick: not done.  ASSESSMENT:  Vaginal Discharge  Vaginal Odor   PLAN:  GC, chlamydia, trichomonas, BVAG, CVAG probe sent to lab. Treatment: To be determined once lab results are received ROV prn if symptoms persist or worsen.

## 2016-11-13 ENCOUNTER — Other Ambulatory Visit: Payer: Self-pay | Admitting: Obstetrics & Gynecology

## 2016-11-16 LAB — CERVICOVAGINAL ANCILLARY ONLY
Bacterial vaginitis: NEGATIVE
Candida vaginitis: NEGATIVE
Chlamydia: NEGATIVE
Neisseria Gonorrhea: NEGATIVE
Trichomonas: NEGATIVE

## 2016-11-19 ENCOUNTER — Telehealth: Payer: Self-pay | Admitting: Neurology

## 2016-11-19 ENCOUNTER — Telehealth: Payer: Self-pay

## 2016-11-19 ENCOUNTER — Ambulatory Visit: Payer: Medicaid Other

## 2016-11-19 NOTE — Telephone Encounter (Signed)
Can come and get toradol injection, 60 mg IM if she would like.  Hasn't been seen by Dr. Tomi Likens since November, 2017.  Needs a f/u appt after botox.

## 2016-11-19 NOTE — Telephone Encounter (Signed)
Spoke w/Pt, she will try to find someone to bring her for Toradol injection.

## 2016-11-19 NOTE — Telephone Encounter (Signed)
Called Pt to determine meds she has taken for HA x3 days-Pt states has been taking Depakote as prescribed, has tried Fioricet with no relief. Pt has had N/V x 2 days, has promethazine suppositories, has used with some benefit. Pls advise

## 2016-11-19 NOTE — Telephone Encounter (Signed)
Patient states that she has had a migraine for the last 3 days and her medication is not working and would like to talk to someone about maybe a new medication or if there is something else she can do. We had to resch her botox from 11-23-16 to 12-10-16 please call

## 2016-11-23 ENCOUNTER — Ambulatory Visit: Payer: Medicaid Other | Admitting: Neurology

## 2016-11-26 ENCOUNTER — Ambulatory Visit: Payer: Medicaid Other

## 2016-11-30 ENCOUNTER — Ambulatory Visit: Payer: Medicaid Other

## 2016-11-30 ENCOUNTER — Ambulatory Visit
Admission: RE | Admit: 2016-11-30 | Discharge: 2016-11-30 | Disposition: A | Discharge status: Home / Self Care | Payer: Medicaid Other | Source: Ambulatory Visit | Attending: Obstetrics & Gynecology | Admitting: Obstetrics & Gynecology

## 2016-11-30 DIAGNOSIS — Z1231 Encounter for screening mammogram for malignant neoplasm of breast: Secondary | ICD-10-CM

## 2016-12-05 ENCOUNTER — Telehealth: Payer: Self-pay | Admitting: Neurology

## 2016-12-05 NOTE — Telephone Encounter (Signed)
LM for Pt to call me back

## 2016-12-05 NOTE — Telephone Encounter (Signed)
PT called and wanted a call back regarding her Botox appointment on Monday, she is feeling nervous about it

## 2016-12-06 NOTE — Telephone Encounter (Signed)
Attempted to reach Pt again concerning her telephone call from yesterday 12/05/16, stating she was anxious about upcoming Botox appointment. LM for Pt to return call to discuss appointment.

## 2016-12-07 NOTE — Telephone Encounter (Signed)
LM again for Pt to return call concerning Botox appt she wanted to discuss.

## 2016-12-10 ENCOUNTER — Ambulatory Visit: Payer: Medicaid Other | Admitting: Neurology

## 2016-12-10 ENCOUNTER — Telehealth: Payer: Self-pay | Admitting: Neurology

## 2016-12-10 NOTE — Telephone Encounter (Signed)
Patient called sick with a Stomach bug and rescheduled. She asked could a Caffeine pill be called in for her until she can come in for the 01/04/17 Botox Appointment. She has a bad migraine also.  Please Advise. Thanks

## 2016-12-10 NOTE — Telephone Encounter (Signed)
LM for Pt to return call about caffeine pill?

## 2016-12-11 ENCOUNTER — Telehealth: Payer: Self-pay | Admitting: Neurology

## 2016-12-11 NOTE — Telephone Encounter (Signed)
PT called and said she is returning your call from yesterday, head still hurting

## 2016-12-12 MED ORDER — ELETRIPTAN HYDROBROMIDE 40 MG PO TABS
40.0000 mg | ORAL_TABLET | ORAL | 0 refills | Status: DC | PRN
Start: 2016-12-12 — End: 2017-02-26

## 2016-12-12 NOTE — Telephone Encounter (Signed)
Spoke w/Pt, Migranal N/S isn't effective any longer. She states that on the day she was to receive Botox, she had an intense migraine and was vomiting so much she could not come, she r/s to 01/04/17. She recently had a fall and fx her coccyx, has appt on Friday for f/u of that. No longer on oxycodone, was given hydrocodone for that. Present headache is starting to lessen, but is unsure what to do for relief when she has a headache now. She rqstd a caffeine pill that someone said would help her. Has only been seen in consultation 03/15/16. What recommendations do you have for her?

## 2016-12-12 NOTE — Telephone Encounter (Signed)
She can try Relpax 40mg  at earliest onset of headache and may repeat once in 2 hours if needed.  I haven't seen her in quite a while, so she needs to follow up with me for further recommendations.  I would like to to see her prior to Botox if possible.

## 2016-12-12 NOTE — Telephone Encounter (Signed)
Spoke w/Pt, advsd of Rx and instructions. Pt to call back in a.m. And make appt prior to Botox. Called in Rx to [place], spoke with [place].

## 2016-12-14 ENCOUNTER — Other Ambulatory Visit: Payer: Self-pay | Admitting: Neurology

## 2016-12-25 ENCOUNTER — Ambulatory Visit: Payer: Self-pay | Admitting: Neurology

## 2016-12-25 NOTE — Telephone Encounter (Signed)
Pt called office, has appt at 11am, is nauseated feels like a stomach bug, advsd her to stay home and keep botox appt on 9/21 and I will make Dr Tomi Likens aware.

## 2017-01-04 ENCOUNTER — Ambulatory Visit (INDEPENDENT_AMBULATORY_CARE_PROVIDER_SITE_OTHER): Payer: Medicaid Other | Admitting: Neurology

## 2017-01-04 ENCOUNTER — Other Ambulatory Visit: Payer: Medicaid Other

## 2017-01-04 ENCOUNTER — Ambulatory Visit: Payer: Medicaid Other | Admitting: Oncology

## 2017-01-04 DIAGNOSIS — G43709 Chronic migraine without aura, not intractable, without status migrainosus: Secondary | ICD-10-CM

## 2017-01-04 MED ORDER — ONABOTULINUMTOXINA 100 UNITS IJ SOLR
200.0000 [IU] | Freq: Once | INTRAMUSCULAR | Status: AC
  Administered 2017-01-04: 200 [IU] via INTRAMUSCULAR

## 2017-01-04 NOTE — Progress Notes (Signed)
Botulinum Clinic   Procedure Note Botox  Attending: Dr. Adam Jaffe  Preoperative Diagnosis(es): Chronic migraine  Consent obtained from: The patient Benefits discussed included, but were not limited to decreased muscle tightness, increased joint range of motion, and decreased pain.  Risk discussed included, but were not limited pain and discomfort, bleeding, bruising, excessive weakness, venous thrombosis, muscle atrophy and dysphagia.  Anticipated outcomes of the procedure as well as he risks and benefits of the alternatives to the procedure, and the roles and tasks of the personnel to be involved, were discussed with the patient, and the patient consents to the procedure and agrees to proceed. A copy of the patient medication guide was given to the patient which explains the blackbox warning.  Patients identity and treatment sites confirmed Yes.  .  Details of Procedure: Skin was cleaned with alcohol. Prior to injection, the needle plunger was aspirated to make sure the needle was not within a blood vessel.  There was no blood retrieved on aspiration.    Following is a summary of the muscles injected  And the amount of Botulinum toxin used:  Dilution 200 units of Botox was reconstituted with 4 ml of preservative free normal saline. Time of reconstitution: At the time of the office visit (<30 minutes prior to injection)   Injections  155 total units of Botox was injected with a 30 gauge needle.  Injection Sites: L occipitalis: 15 units- 3 sites  R occiptalis: 15 units- 3 sites  L upper trapezius: 15 units- 3 sites R upper trapezius: 15 units- 3 sits          L paraspinal: 10 units- 2 sites R paraspinal: 10 units- 2 sites  Face L frontalis(2 injection sites):10 units   R frontalis(2 injection sites):10 units         L corrugator: 5 units   R corrugator: 5 units           Procerus: 5 units   L temporalis: 20 units R temporalis: 20 units   Agent:  200 units of botulinum Type  A (Onobotulinum Toxin type A) was reconstituted with 4 ml of preservative free normal saline.  Time of reconstitution: At the time of the office visit (<30 minutes prior to injection)     Total injected (Units): 155  Total wasted (Units): 45  Patient tolerated procedure well without complications.   Reinjection is anticipated in 3 months. Return to clinic in 4 1/2 months.   

## 2017-01-15 ENCOUNTER — Telehealth: Payer: Self-pay | Admitting: Oncology

## 2017-01-15 ENCOUNTER — Ambulatory Visit (HOSPITAL_BASED_OUTPATIENT_CLINIC_OR_DEPARTMENT_OTHER): Payer: Medicaid Other | Admitting: Oncology

## 2017-01-15 ENCOUNTER — Other Ambulatory Visit (HOSPITAL_BASED_OUTPATIENT_CLINIC_OR_DEPARTMENT_OTHER): Payer: Medicaid Other

## 2017-01-15 VITALS — BP 122/57 | HR 78 | Temp 98.6°F | Resp 18 | Ht 71.0 in | Wt 186.4 lb

## 2017-01-15 DIAGNOSIS — D500 Iron deficiency anemia secondary to blood loss (chronic): Secondary | ICD-10-CM

## 2017-01-15 LAB — CBC WITH DIFFERENTIAL/PLATELET
BASO%: 0.2 % (ref 0.0–2.0)
Basophils Absolute: 0 10*3/uL (ref 0.0–0.1)
EOS%: 0.5 % (ref 0.0–7.0)
Eosinophils Absolute: 0 10*3/uL (ref 0.0–0.5)
HCT: 36 % (ref 34.8–46.6)
HGB: 11.8 g/dL (ref 11.6–15.9)
LYMPH%: 29 % (ref 14.0–49.7)
MCH: 29.4 pg (ref 25.1–34.0)
MCHC: 32.8 g/dL (ref 31.5–36.0)
MCV: 89.5 fL (ref 79.5–101.0)
MONO#: 0.5 10*3/uL (ref 0.1–0.9)
MONO%: 9.3 % (ref 0.0–14.0)
NEUT#: 3.2 10*3/uL (ref 1.5–6.5)
NEUT%: 61 % (ref 38.4–76.8)
Platelets: 240 10*3/uL (ref 145–400)
RBC: 4.02 10*6/uL (ref 3.70–5.45)
RDW: 13.4 % (ref 11.2–14.5)
WBC: 5.3 10*3/uL (ref 3.9–10.3)
lymph#: 1.5 10*3/uL (ref 0.9–3.3)

## 2017-01-15 LAB — IRON AND TIBC
%SAT: 25 % (ref 21–57)
Iron: 77 ug/dL (ref 41–142)
TIBC: 315 ug/dL (ref 236–444)
UIBC: 238 ug/dL (ref 120–384)

## 2017-01-15 LAB — FERRITIN: Ferritin: 830 ng/ml — ABNORMAL HIGH (ref 9–269)

## 2017-01-15 NOTE — Progress Notes (Signed)
Hematology and Oncology Follow Up Visit  Christine Vega [id number] 03/26/1971 46 y.o. 01/15/2017 1:31 PM Christine Vega, MDGarba, Christine Newcomer, MD   Principle Diagnosis: 46 year old with iron deficiency anemia related to menorrhagia and menstrual blood losses. This was diagnosed in June 2017. Her hemoglobin was 11, iron 28 saturation 11%.   Prior Therapy: She is status post Feraheme infusion for a total of 1000 mg completed on multiple occasions last of which in May 2018.  Current therapy: Oral iron replacement.  Interim History: Christine Vega presents today for a follow-up visit. Since the last visit, she reports no major changes in her health. She does report some mild fatigue but no hematochezia or melena. She is no longer reporting any bleeding and had a hysterectomy a year ago. She does not take any oral iron supplement at this time and has tolerated oral therapy in the past without complications. She did report constipation related to it. Currently, she reports no GI symptoms. She does not report any nausea, vomiting, hematochezia or change in her bowel habits.  She does not report any headaches, blurry vision, syncope or seizures. She does not report any fevers or chills or sweats. She does not report any cough, wheezing or hemoptysis. She does not report any nausea or vomiting or abdominal pain. She does not report any frequency urgency or hesitancy. She does not report any skeletal complaints. Remaining review of systems unremarkable.    Medications: I have reviewed the patient's current medications.  Current Outpatient Prescriptions  Medication Sig Dispense Refill  . botulinum toxin Type A (BOTOX) 100 units SOLR injection Inject 200 Units into the muscle every 3 (three) months. 200 Units 1  . buPROPion (WELLBUTRIN XL) 150 MG 24 hr tablet Take 150 mg by mouth daily.    . butalbital-acetaminophen-caffeine (FIORICET, ESGIC) 50-325-40 MG tablet Take 1 tablet by mouth every 8 (eight) hours  as needed for migraine.    . cetirizine (ZYRTEC) 10 MG tablet Take 10 mg by mouth daily as needed for allergies.    Marland Kitchen divalproex (DEPAKOTE) 500 MG DR tablet Take 2 tablets (1,000 mg total) by mouth 2 (two) times daily. 120 tablet 2  . divalproex (DEPAKOTE) 500 MG DR tablet TAKE 2 TABLETS BY MOUTH TWICE DAILY 120 tablet 2  . docusate sodium (COLACE) 100 MG capsule Take 1 capsule (100 mg total) by mouth 2 (two) times daily as needed. 30 capsule 2  . eletriptan (RELPAX) 40 MG tablet Take 1 tablet (40 mg total) by mouth as needed for migraine or headache. May repeat in 2 hours if headache persists or recurs. No more than 2 in 24 hours 10 tablet 0  . fluconazole (DIFLUCAN) 150 MG tablet TAKE 1 TABLET BY MOUTH ONCE; MAY TAKE ADDITIONAL DOSE 3 DAYS LATER IF SYMPTOMS PERSIST 1 tablet 2  . gabapentin (NEURONTIN) 300 MG capsule Take 300 mg by mouth 3 (three) times daily.    Marland Kitchen HYDROcodone-acetaminophen (NORCO/VICODIN) 5-325 MG tablet TK 1 T PO Q 6 H  0  . hydrOXYzine (VISTARIL) 25 MG capsule Take 25 mg by mouth 3 (three) times daily as needed for anxiety.    Marland Kitchen lubiprostone (AMITIZA) 24 MCG capsule Take 24 mcg by mouth 2 (two) times daily with a meal.    . metroNIDAZOLE (FLAGYL) 500 MG tablet Take 1 tablet (500 mg total) by mouth 2 (two) times daily. 14 tablet 0  . nortriptyline (PAMELOR) 50 MG capsule TAKE 1 CAPSULE BY MOUTH EVERY NIGHT 7 capsule 0  .  prochlorperazine (COMPAZINE) 10 MG tablet Take 10 mg by mouth every 6 (six) hours as needed for nausea or vomiting.    . promethazine (PROMETHEGAN) 50 MG suppository Place 50 mg rectally every 8 (eight) hours as needed for nausea or vomiting.     . propranolol ER (INDERAL LA) 120 MG 24 hr capsule Take 120 mg by mouth daily.    . simvastatin (ZOCOR) 20 MG tablet TK 1 T PO D  1  . tiZANidine (ZANAFLEX) 4 MG tablet Take 4 mg by mouth every 12 (twelve) hours as needed for muscle spasms.      No current facility-administered medications for this visit.       Allergies:  Allergies  Allergen Reactions  . Penicillins Hives, Itching and Other (See Comments)    Has patient had a PCN reaction causing immediate rash, facial/tongue/throat swelling, SOB or lightheadedness with hypotension: No Has patient had a PCN reaction causing severe rash involving mucus membranes or skin necrosis: No Has patient had a PCN reaction that required hospitalization: No Has patient had a PCN reaction occurring within the last 10 years: Yes If all of the above answers are "NO", then may proceed with Cephalosporin use.   . Prednisone Hives and Itching  . Zithromax [Azithromycin] Hives and Itching    Past Medical History, Surgical history, Social history, and Family History were reviewed and updated.   Physical Exam: Blood pressure (!) 122/57, pulse 78, temperature 98.6 F (37 C), temperature source Oral, resp. rate 18, height 5\' 11"  (1.803 m), weight 186 lb 6.4 oz (84.6 kg), last menstrual period 01/13/2016, SpO2 100 %. ECOG:0 General appearance: alert and cooperative appeared without distress. Head: Normocephalic, without obvious abnormality without any ulcers or lesions. Neck: no adenopathy Lymph nodes: Cervical, supraclavicular, and axillary nodes normal. Heart:regular rate and rhythm, S1, S2 normal, no murmur, click, rub or gallop Lung:chest clear, no wheezing, rales, normal symmetric air entry. Abdomin: soft, non-tender, without masses or organomegaly EXT:no erythema, induration, or nodules   Lab Results: Lab Results  Component Value Date   WBC 5.3 01/15/2017   HGB 11.8 01/15/2017   HCT 36.0 01/15/2017   MCV 89.5 01/15/2017   PLT 240 01/15/2017     Chemistry      Component Value Date/Time   NA 137 05/16/2014 1816   K 4.2 05/16/2014 1816   CL 103 05/16/2014 1816   CO2 27 05/16/2014 1816   BUN 7 05/16/2014 1816   CREATININE 0.73 05/16/2014 1816      Component Value Date/Time   CALCIUM 9.5 05/16/2014 1816   ALKPHOS 52 05/16/2014 1816    AST 16 05/16/2014 1816   ALT 13 05/16/2014 1816   BILITOT 0.2 (L) 05/16/2014 1816        Impression and Plan:   46 year old woman with the following issues:  1. Iron deficiency anemia documented on multiple occasions. Her iron levels in June 2017 with 28 with hemoglobin of 11 and saturation of 11%. Iron deficiency related to menstrual blood losses.  She is status post Feraheme infusion on previous occasions last of which in May 2018. Her hemoglobin today appears slightly lower and she does report some mild symptoms of fatigue and tiredness.  The plan is to proceed with oral iron maintenance at this time and repeat iron studies in 6 months. Therefore iron is inadequate, intravenous iron can be used again.  2. Colon cancer screening: She does not have any GI symptoms and her iron deficiency have been clearly related to menstrual  blood losses. She is at age that colon cancer screening is recommended. I recommended she proceed with colonoscopy in the near future.     2. Follow-up: Will be in 6 months to repeat iron studies and replace iron as needed.    Zola Button, MD 10/2/20181:31 PM

## 2017-01-15 NOTE — Telephone Encounter (Signed)
Gave patient AVS and calendar of upcoming April appointments.  °

## 2017-02-22 ENCOUNTER — Telehealth: Payer: Self-pay

## 2017-02-22 NOTE — Telephone Encounter (Signed)
Call to [facility] for eletriptan-see call notes

## 2017-02-26 MED ORDER — ELETRIPTAN HYDROBROMIDE 40 MG PO TABS: 40.0000 mg | ORAL_TABLET | ORAL | 3 refills | Status: DC | PRN

## 2017-02-26 NOTE — Telephone Encounter (Signed)
Have not rcvd determination on Elitriptan PA request-called [facility], spoke with Marjory Lies, he said was apprvd: valid 02/22/17-02/17/18 EX#[id number]. Called Pt to advise, she rqstd I send to [facility] on [address], faxed Rx

## 2017-02-26 NOTE — Addendum Note (Signed)
Addended by: Clois Comber on: 02/26/2017 04:07 PM   Modules accepted: Orders

## 2017-03-12 ENCOUNTER — Other Ambulatory Visit: Payer: Self-pay | Admitting: Neurology

## 2017-03-12 NOTE — Telephone Encounter (Signed)
LM for Pt to rtrn my call, Eletriptan was refilled on 02/26/17 with 3 additional refills.

## 2017-03-15 ENCOUNTER — Other Ambulatory Visit: Payer: Self-pay | Admitting: Obstetrics & Gynecology

## 2017-04-01 ENCOUNTER — Other Ambulatory Visit: Payer: Self-pay

## 2017-04-01 MED ORDER — ONABOTULINUMTOXINA 200 UNITS IJ SOLR: 155.0000 [IU] | INTRAMUSCULAR | 2 refills | Status: DC

## 2017-04-03 ENCOUNTER — Telehealth: Payer: Self-pay

## 2017-04-03 NOTE — Telephone Encounter (Signed)
Called Pt to verify she had her Botox for Fridays Botox appt, she said she is not well yet and needs to R/S until after the 1st of the year.

## 2017-04-05 ENCOUNTER — Ambulatory Visit: Payer: Medicaid Other | Admitting: Neurology

## 2017-04-24 ENCOUNTER — Other Ambulatory Visit: Payer: Self-pay | Admitting: Neurology

## 2017-05-01 ENCOUNTER — Telehealth: Payer: Self-pay | Admitting: Radiology

## 2017-05-01 NOTE — Telephone Encounter (Signed)
Return call to patient, no answer, left message on cell phone to call [facility],

## 2017-05-02 ENCOUNTER — Telehealth: Payer: Self-pay | Admitting: *Deleted

## 2017-05-02 MED ORDER — METRONIDAZOLE 0.75 % VA GEL: 1.0000 | Freq: Every day | VAGINAL | 1 refills | Status: DC

## 2017-05-02 NOTE — Telephone Encounter (Signed)
-----   Message from Blanchie Dessert, [place] sent at 05/01/2017  4:42 PM EST ----- Regarding: can you call patient about BV Contact: [phone] Please call patient she has BV and doesn't have a way to get to office, she lives in [place], and was hoping that you would call her in something.

## 2017-05-08 ENCOUNTER — Telehealth: Payer: Self-pay | Admitting: *Deleted

## 2017-05-08 NOTE — Telephone Encounter (Signed)
Patient called and left a voice mail message stating,"I'm feeling bad again, you know, when I need an iron infusion. I don't even know what my numbers are from October. Can you tell Dr. Alen Blew to sign me up for some iron. Return number is [phone]."

## 2017-05-09 ENCOUNTER — Other Ambulatory Visit: Payer: Self-pay | Admitting: Oncology

## 2017-05-09 ENCOUNTER — Telehealth: Payer: Self-pay | Admitting: Oncology

## 2017-05-09 DIAGNOSIS — D509 Iron deficiency anemia, unspecified: Secondary | ICD-10-CM

## 2017-05-09 NOTE — Telephone Encounter (Signed)
Message to scheduling sent for labs first and infusion of iron if needed.

## 2017-05-09 NOTE — Telephone Encounter (Signed)
Called patient to schedule a lab today per 1/24 sch msg - spoke with patient regarding appts.

## 2017-05-10 ENCOUNTER — Ambulatory Visit: Payer: Medicaid Other | Admitting: Neurology

## 2017-05-14 ENCOUNTER — Other Ambulatory Visit: Payer: Medicaid Other

## 2017-05-14 DIAGNOSIS — J343 Hypertrophy of nasal turbinates: Secondary | ICD-10-CM | POA: Insufficient documentation

## 2017-05-31 ENCOUNTER — Encounter: Payer: Self-pay | Admitting: *Deleted

## 2017-05-31 ENCOUNTER — Telehealth: Payer: Self-pay | Admitting: Oncology

## 2017-06-03 ENCOUNTER — Telehealth: Payer: Self-pay | Admitting: *Deleted

## 2017-06-03 NOTE — Telephone Encounter (Signed)
Spoke with patient regarding feeling weak and fatigued. She stated,"I think I need an iron infusion." I sent a LOS to scheuduling to make a lab appointment for Thursday, 06/06/17 @ 1:00 pm per patient request.

## 2017-06-04 ENCOUNTER — Other Ambulatory Visit: Payer: Self-pay | Admitting: Oncology

## 2017-06-04 DIAGNOSIS — D500 Iron deficiency anemia secondary to blood loss (chronic): Secondary | ICD-10-CM

## 2017-06-06 ENCOUNTER — Inpatient Hospital Stay: Payer: Medicaid Other | Attending: Oncology

## 2017-06-06 ENCOUNTER — Telehealth: Payer: Self-pay | Admitting: Neurology

## 2017-06-06 ENCOUNTER — Telehealth: Payer: Self-pay | Admitting: *Deleted

## 2017-06-06 DIAGNOSIS — D500 Iron deficiency anemia secondary to blood loss (chronic): Secondary | ICD-10-CM

## 2017-06-06 LAB — CBC WITH DIFFERENTIAL (CANCER CENTER ONLY)
Basophils Absolute: 0 10*3/uL (ref 0.0–0.1)
Basophils Relative: 0 %
Eosinophils Absolute: 0 10*3/uL (ref 0.0–0.5)
Eosinophils Relative: 1 %
HCT: 38.3 % (ref 34.8–46.6)
Hemoglobin: 12.4 g/dL (ref 11.6–15.9)
Lymphocytes Relative: 29 %
Lymphs Abs: 1.3 10*3/uL (ref 0.9–3.3)
MCH: 29 pg (ref 25.1–34.0)
MCHC: 32.4 g/dL (ref 31.5–36.0)
MCV: 89.5 fL (ref 79.5–101.0)
Monocytes Absolute: 0.4 10*3/uL (ref 0.1–0.9)
Monocytes Relative: 9 %
Neutro Abs: 2.8 10*3/uL (ref 1.5–6.5)
Neutrophils Relative %: 61 %
Platelet Count: 219 10*3/uL (ref 145–400)
RBC: 4.28 MIL/uL (ref 3.70–5.45)
RDW: 13.5 % (ref 11.2–14.5)
WBC Count: 4.6 10*3/uL (ref 3.9–10.3)

## 2017-06-06 LAB — IRON AND TIBC
Iron: 89 ug/dL (ref 41–142)
Saturation Ratios: 27 % (ref 21–57)
TIBC: 329 ug/dL (ref 236–444)
UIBC: 240 ug/dL

## 2017-06-06 LAB — FERRITIN: Ferritin: 1181 ng/mL — ABNORMAL HIGH (ref 9–269)

## 2017-06-06 NOTE — Telephone Encounter (Signed)
-----   Message from Wyatt Portela, MD sent at 06/06/2017  2:56 PM EST ----- Please let her know her iron level is normal. No need for IV iron.

## 2017-06-06 NOTE — Telephone Encounter (Signed)
As noted below by Dr. Alen Blew, I informed patient of her iron levels. She does not need IV iron. Patient verbalized understanding.

## 2017-06-06 NOTE — Telephone Encounter (Signed)
Called Pt, advsd her have not rcvd auth for Botox, but will contact ASAP when I do, Appt for tomorrows Botox clinic is cx

## 2017-06-06 NOTE — Telephone Encounter (Signed)
Patient would like you to please call her regarding her Botox appt tomorrow.Thanks

## 2017-06-07 ENCOUNTER — Ambulatory Visit: Payer: Medicaid Other | Admitting: Neurology

## 2017-06-17 ENCOUNTER — Telehealth: Payer: Self-pay | Admitting: Neurology

## 2017-06-17 NOTE — Telephone Encounter (Signed)
Pt said it is regarding Botox and if it was approved

## 2017-06-17 NOTE — Telephone Encounter (Signed)
Pt left a VM message asking for a call back from Dr Tomi Likens or his nurse, did not say why

## 2017-06-18 ENCOUNTER — Telehealth: Payer: Self-pay | Admitting: Neurology

## 2017-06-18 NOTE — Telephone Encounter (Signed)
I called patient and informed her that she will need an appointment.  She agreed.  Will place her on waiting list.

## 2017-06-18 NOTE — Telephone Encounter (Signed)
This patient has cancelled her last 3 appointments.  Please advise.  Headache cocktail?

## 2017-06-18 NOTE — Telephone Encounter (Signed)
Pt called and said her migraines are really bad and needs something for it, please call and advise

## 2017-06-18 NOTE — Telephone Encounter (Signed)
I have only performed Botox once in September (she cancelled other appointments) and I last saw her for an office visit over a year ago.  She will need to make an appointment to discuss further treatment.  In the meantime, she should contact her PCP for more immediate therapy.

## 2017-06-26 ENCOUNTER — Ambulatory Visit: Payer: Medicaid Other | Admitting: Neurology

## 2017-06-26 ENCOUNTER — Encounter: Payer: Self-pay | Admitting: Neurology

## 2017-06-26 VITALS — BP 126/84 | HR 80 | Ht 71.5 in | Wt 188.0 lb

## 2017-06-26 DIAGNOSIS — G43709 Chronic migraine without aura, not intractable, without status migrainosus: Secondary | ICD-10-CM | POA: Diagnosis not present

## 2017-06-26 NOTE — Progress Notes (Addendum)
NEUROLOGY FOLLOW UP OFFICE NOTE  Christine Vega [id number]  HISTORY OF PRESENT ILLNESS: Christine Vega is a 47 year old woman with migraines, depression and chronic pain who follows up for chronic migraine.  UPDATE: I have not seen Ms. Decoteau since November 2017.  At that time, she was supposed to follow up for Botox, however she was lost to follow up.  She missed her first Botox injection in November 24, 2022 due to a death in the family.  She had Botox in September but did not follow up for her second round because she did not feel well.  She states she didn't miss her next Botox but that Medicaid didn't approve the second round.  Over the past year, both nortriptyline was weaned off.    Intensity:  severe Duration:  1 day Frequency:  16 days a month Frequency of abortive medication: most days a month Current NSAIDS:  no Current analgesics:  Fioricet.  Hydrocodone for back pain Current triptans/ergot:  Relpax Current anti-emetic:  promethazine 50mg  PR Current muscle relaxants:  tizanidine 4mg  Current anti-anxiolytic:  no Current sleep aide:  no Current Antihypertensive medications:  Propranolol ER 120mg  Current Antidepressant medications:  bupropion, sertraline 150mg  Current Anticonvulsant medications:  divalproex 500mg  24r twice daily, gabapentin 300mg  three times daily Current Vitamins/Herbal/Supplements:  D, E Current Antihistamines/Decongestants:  hydroxyzine Other therapy:  no   Caffeine:  Rarely coffee, tea, soda Alcohol:  no Smoker:  no Diet:  Hydrates Exercise:  yes Depression/stress:  stable Sleep hygiene:  poor   06/06/17:  CBC with WBC 4.6, HGB 12.4, HCT 38.3 and PLT 219  HISTORY: Onset:  47 years old Location:  Either temple Quality:  Throbbing, pressure Intensity:  10/10 Aura:  no Prodrome:  no Associated symptoms:  Nausea, vomiting, photophobia, phonophobia, osmophobia, blurred vision Duration:  3 days  Frequency:  16 headache days a month Triggers/exacerbating  factors:  Certain smells, chocolate , garlic Relieving factors:  no Activity:  Cannot function   Past NSAIDS:  Ibuprofen, naproxen Past analgesics:  Tylenol, oxycodone, Excedrin Past abortive triptans/ergot:  Sumatriptan (tablet and injection), Maxalt, Zomig NS, Migranal NS Past muscle relaxants:  Flexeril Past anti-emetic:  promethazine Past anti-anxiolytic:  lorazepam Past antihypertensive medications:  verapamil Past antidepressant medications:  nortriptyline Past anticonvulsant medications:  Topiramate, Lyrica Past vitamins/Herbal/Supplements:  No Other therapy:  no  Family history of headache:  Mother, daughters   CT of head and cervical spine from 12/20/15 were personally reviewed and were negative for acute changes.  Cervical spine did demonstrate degenerative disc disease at C5-6 and C6-7.  PAST MEDICAL HISTORY: Past Medical History:  Diagnosis Date  . Anemia    history   . Anxiety   . Back pain    rotates to left hip and tailbone  . Depression   . Endometriosis   . Fractured coccyx ([place])   . Heart murmur    no problems  . History of seasonal allergies   . Hyperlipidemia   . Migraine   . Migraines    tx w/depakote/verapamil per pt  . Murmur, heart   . 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 Post traumatic stress disorder (PTSD)   . Seizures ([place]) 2004   x 1; unknown source    MEDICATIONS: Current Outpatient Medications on File Prior to Visit  Medication Sig Dispense Refill  . Botulinum Toxin Type A 200 units SOLR Inject 155 Units as directed as directed. 1 each 2  .  buPROPion (WELLBUTRIN XL) 150 MG 24 hr tablet Take 150 mg by mouth daily.    . butalbital-acetaminophen-caffeine (FIORICET, ESGIC) 50-325-40 MG tablet Take 1 tablet by mouth every 8 (eight) hours as needed for migraine.    . cetirizine (ZYRTEC) 10 MG tablet Take 10 mg by mouth daily as needed for allergies.    Marland Kitchen divalproex (DEPAKOTE) 500 MG DR tablet TAKE 2  TABLETS BY MOUTH TWICE DAILY 120 tablet 2  . eletriptan (RELPAX) 40 MG tablet Take 1 tablet (40 mg total) as needed by mouth for migraine or headache. May repeat in 2 hours if headache persists or recurs. No more than 2 in 24 hours 10 tablet 3  . fluconazole (DIFLUCAN) 150 MG tablet TAKE 1 TABLET BY MOUTH ONCE; MAY TAKE ADDITIONAL DOSE 3 DAYS LATER IF SYMPTOMS PERSIST 1 tablet 2  . gabapentin (NEURONTIN) 300 MG capsule Take 300 mg by mouth 3 (three) times daily.    Marland Kitchen HYDROcodone-acetaminophen (NORCO/VICODIN) 5-325 MG tablet TK 1 T PO Q 6 H  0  . hydrOXYzine (VISTARIL) 25 MG capsule Take 25 mg by mouth 3 (three) times daily as needed for anxiety.    Marland Kitchen lubiprostone (AMITIZA) 24 MCG capsule Take 24 mcg by mouth 2 (two) times daily with a meal.    . prochlorperazine (COMPAZINE) 10 MG tablet Take 10 mg by mouth every 6 (six) hours as needed for nausea or vomiting.    . promethazine (PROMETHEGAN) 50 MG suppository Place 50 mg rectally every 8 (eight) hours as needed for nausea or vomiting.     . propranolol ER (INDERAL LA) 120 MG 24 hr capsule Take 120 mg by mouth daily.    . sertraline (ZOLOFT) 100 MG tablet Take 100 mg by mouth daily.    . simvastatin (ZOCOR) 20 MG tablet TK 1 T PO D  1  . tiZANidine (ZANAFLEX) 4 MG tablet Take 4 mg by mouth every 12 (twelve) hours as needed for muscle spasms.      No current facility-administered medications on file prior to visit.     ALLERGIES: Allergies  Allergen Reactions  . Penicillins Hives, Itching and Other (See Comments)    Has patient had a PCN reaction causing immediate rash, facial/tongue/throat swelling, SOB or lightheadedness with hypotension: No Has patient had a PCN reaction causing severe rash involving mucus membranes or skin necrosis: No Has patient had a PCN reaction that required hospitalization: No Has patient had a PCN reaction occurring within the last 10 years: Yes If all of the above answers are "NO", then may proceed with  Cephalosporin use.   . Prednisone Hives and Itching  . Zithromax [Azithromycin] Hives and Itching    FAMILY HISTORY: Family History  Problem Relation Age of Onset  . Migraines Mother   . Heart disease Father   . Migraines Father   . Migraines Daughter     SOCIAL HISTORY: Social History   Socioeconomic History  . Marital status: Legally Separated    Spouse name: Not on file  . Number of children: Not on file  . Years of education: Not on file  . Highest education level: Not on file  Social Needs  . Financial resource strain: Not on file  . Food insecurity - worry: Not on file  . Food insecurity - inability: Not on file  . Transportation needs - medical: Not on file  . Transportation needs - non-medical: Not on file  Occupational History  . Not on file  Tobacco Use  .  Smoking status: Never Smoker  . Smokeless tobacco: Never Used  Substance and Sexual Activity  . Alcohol use: No    Alcohol/week: 0.0 oz  . Drug use: No  . Sexual activity: Not Currently    Birth control/protection: Surgical  Other Topics Concern  . Not on file  Social History Narrative  . Not on file    REVIEW OF SYSTEMS: Constitutional: No fevers, chills, or sweats, no generalized fatigue, change in appetite Eyes: No visual changes, double vision, eye pain Ear, nose and throat: No hearing loss, ear pain, nasal congestion, sore throat Cardiovascular: No chest pain, palpitations Respiratory:  No shortness of breath at rest or with exertion, wheezes GastrointestinaI: No nausea, vomiting, diarrhea, abdominal pain, fecal incontinence Genitourinary:  No dysuria, urinary retention or frequency Musculoskeletal:  No neck pain, back pain Integumentary: No rash, pruritus, skin lesions Neurological: as above Psychiatric: No depression, insomnia, anxiety Endocrine: No palpitations, fatigue, diaphoresis, mood swings, change in appetite, change in weight, increased thirst Hematologic/Lymphatic:  No purpura,  petechiae. Allergic/Immunologic: no itchy/runny eyes, nasal congestion, recent allergic reactions, rashes  PHYSICAL EXAM: Vitals:   06/26/17 0834  BP: 126/84  Pulse: 80  SpO2: 96%   General: With migraine today.  Patient appears well-groomed.  Head:  Normocephalic/atraumatic Eyes:  Fundi examined but not visualized Neck: supple, no paraspinal tenderness, full range of motion Heart:  Regular rate and rhythm Lungs:  Clear to auscultation bilaterally Back: No paraspinal tenderness Neurological Exam: alert and oriented to person, place, and time. Attention span and concentration intact, recent and remote memory intact, fund of knowledge intact.  Speech fluent and not dysarthric, language intact.  CN II-XII intact. Bulk and tone normal, muscle strength 5/5 throughout.  Sensation to light touch  intact.  Deep tendon reflexes 2+ throughout.  Finger to nose testing intact.  Gait normal, Romberg negative.  IMPRESSION: Chronic migraine  PLAN: 1.  We will try to get Botox approved again.  Options are limited.  She has failed many preventatives.  If we cannot get Botox, we will see if an anti-CGRP could be approved. 2.  Continue depakote ER 500mg  twice daily.  Check CMP 3.  Stop Fioricet.  Continue Relpax limited to no more than 2 days out of week to prevent rebound headache. 4.  Headache diary 5.  Follow up in 4 months. 6.  Administered headache cocktail.  She has a driver.  Metta Clines, DO  CC:  Dr. Jonelle Sidle

## 2017-06-26 NOTE — Patient Instructions (Signed)
1.  We will first see if we can get Botox approved again.  If not, we will try one of the monthly injections. 2.  Stop Fioricet.  For headache pain, just take the eletriptan (Relpax) but no more than 2 days out of week. 3.  Keep headache diary 4.  Follow up in 4 months.

## 2017-06-28 ENCOUNTER — Telehealth: Payer: Self-pay | Admitting: Neurology

## 2017-06-28 NOTE — Telephone Encounter (Signed)
Spoke with patient. She wanted to know how long it takes to get Botox approval through Medicaid. She has had it denied in the past. Made patient aware it could take several weeks if we have to appeal the decision.   She is having a lot of trouble with headaches. She was just given a headache cocktail in our office two days ago. She is taking the Relpax. States she will just "have to find an urgent care to get another headache cocktail". I asked if she wanted me to ask Dr. Tomi Likens if she could have another cocktail in our office today, but patient refused.   Manuela Schwartz- can you look into getting Botox approved for patient?

## 2017-06-28 NOTE — Telephone Encounter (Signed)
Patient called regarding Botox and Injections? Please Call.

## 2017-07-01 ENCOUNTER — Telehealth: Payer: Self-pay | Admitting: *Deleted

## 2017-07-01 NOTE — Telephone Encounter (Signed)
Pt called and asked if she can have another cocktail because her headache has not eased up.   I called her back to relay what Dr. Tomi Likens had replied:  Another headache cocktail will not likely be effective if she did not respond to the one last week. The headaches will not abort immediately. She will have to be patient while we try to work this out.  She said she understood and I told her I submitted prior authorization for the Botox and will keep her informed as this develops.

## 2017-07-03 ENCOUNTER — Encounter: Payer: Self-pay | Admitting: *Deleted

## 2017-07-03 NOTE — Progress Notes (Unsigned)
Called pt to let her know we received Botox PA from [facility]. I called [phone] [option 3 then 1 then 9]. [facility] needs her to call them at [phone] to set up new pt enrollment since it has been >61months since last shipment. I asked them to read prescription back to me and confirmed it was correct. Pt will call them ASAP and would like to try to get on next Botox date here with Dr. Tomi Likens

## 2017-07-04 ENCOUNTER — Telehealth: Payer: Self-pay | Admitting: Neurology

## 2017-07-04 NOTE — Telephone Encounter (Signed)
Patient would like to speak with you regarding her Botox. Thanks

## 2017-07-08 NOTE — Progress Notes (Signed)
Yes, she may be scheduled for 2:45 pm on Thursday.

## 2017-07-11 ENCOUNTER — Ambulatory Visit (INDEPENDENT_AMBULATORY_CARE_PROVIDER_SITE_OTHER): Payer: Medicaid Other | Admitting: Neurology

## 2017-07-11 DIAGNOSIS — G43709 Chronic migraine without aura, not intractable, without status migrainosus: Secondary | ICD-10-CM | POA: Diagnosis not present

## 2017-07-11 MED ORDER — ONABOTULINUMTOXINA 100 UNITS IJ SOLR
200.0000 [IU] | Freq: Once | INTRAMUSCULAR | Status: AC
  Administered 2017-07-11: 200 [IU] via INTRAMUSCULAR

## 2017-07-11 NOTE — Progress Notes (Signed)
[facility]   Procedure Note Botox  Attending: Dr. Jaffe  Preoperative Diagnosis(es): Chronic migraine  Consent obtained from: The patient. Benefits discussed included, but were not limited to decreased muscle tightness, increased joint range of motion, and decreased pain.  Risk discussed included, but were not limited pain and discomfort, bleeding, bruising, excessive weakness, venous thrombosis, muscle atrophy and dysphagia.  Anticipated outcomes of the procedure as well as he risks and benefits of the alternatives to the procedure, and the roles and tasks of the personnel to be involved, were discussed with the patient, and the patient consents to the procedure and agrees to proceed. A copy of the patient medication guide was given to the patient which explains the blackbox warning.  Patients identity and treatment sites confirmed:  Yes.  Details of Procedure: Skin was cleaned with alcohol. Prior to injection, the needle plunger was aspirated to make sure the needle was not within a blood vessel.  There was no blood retrieved on aspiration.    Following is a summary of the muscles injected  And the amount of Botulinum toxin used:  Dilution 200 units of Botox was reconstituted with 4 ml of preservative free normal saline. Time of reconstitution: At the time of the office visit (<30 minutes prior to injection)   Injections  155 total units of Botox was injected with a 30 gauge needle.  Injection Sites: L occipitalis: 15 units- 3 sites  R occiptalis: 15 units- 3 sites  L upper trapezius: 15 units- 3 sites R upper trapezius: 15 units- 3 sits          L paraspinal: 10 units- 2 sites R paraspinal: 10 units- 2 sites  Face L frontalis(2 injection sites):10 units   R frontalis(2 injection sites):10 units         L corrugator: 5 units   R corrugator: 5 units           Procerus: 5 units   L temporalis: 20 units R temporalis: 20 units   Agent:  200 units of botulinum Type A  (Onobotulinum Toxin type A) was reconstituted with 4 ml of preservative free normal saline.  Time of reconstitution: At the time of the office visit (<30 minutes prior to injection)     Total injected (Units): 155  Total wasted (Units): 45  Patient tolerated procedure well without complications.   Reinjection is anticipated in 3 months. Return to clinic in 4.5 months.   

## 2017-07-17 ENCOUNTER — Telehealth: Payer: Self-pay | Admitting: *Deleted

## 2017-07-17 ENCOUNTER — Other Ambulatory Visit: Payer: Medicaid Other

## 2017-07-17 ENCOUNTER — Telehealth: Payer: Self-pay

## 2017-07-17 ENCOUNTER — Ambulatory Visit: Payer: Medicaid Other | Admitting: Oncology

## 2017-07-17 NOTE — Telephone Encounter (Signed)
Pt called, having severe headache with nausea and dizziness. Used promethazine suppository. Advsd Pt to eat something light, like crackers or dry toast before taking rizatriptan. Pt had appointment with oncology today, she is not going to be able to make appt, she is afraid to drive due to being dizzy and taking medications. Advsd her to make sure and call and let them know.

## 2017-07-17 NOTE — Telephone Encounter (Signed)
Patient called and left a message stating,"I had Bowtox done and now I'm having a migraine. I will not be able to make my appointments today."

## 2017-07-19 ENCOUNTER — Telehealth: Payer: Self-pay | Admitting: Oncology

## 2017-07-19 ENCOUNTER — Other Ambulatory Visit: Payer: Self-pay | Admitting: Oncology

## 2017-07-19 ENCOUNTER — Telehealth: Payer: Self-pay

## 2017-07-19 DIAGNOSIS — D500 Iron deficiency anemia secondary to blood loss (chronic): Secondary | ICD-10-CM

## 2017-07-19 NOTE — Telephone Encounter (Signed)
Patient called in she wants to get next available because she is have problems. Samantha sent sent a message to MD to find out what date

## 2017-07-19 NOTE — Telephone Encounter (Signed)
Pt called in to scheduling requesting to be seen sooner than the end of May. No appointment noted at this time. In-basket sent to Dr. Alen Blew to request further direction for scheduling pt.

## 2017-07-22 ENCOUNTER — Telehealth: Payer: Self-pay | Admitting: Oncology

## 2017-07-22 NOTE — Telephone Encounter (Signed)
Scheduled appt per 4/5 sch message - patient is aware of appt date and time.  

## 2017-07-29 ENCOUNTER — Inpatient Hospital Stay: Payer: Medicaid Other

## 2017-07-29 ENCOUNTER — Inpatient Hospital Stay: Payer: Medicaid Other | Attending: Oncology | Admitting: Oncology

## 2017-07-29 ENCOUNTER — Other Ambulatory Visit: Payer: Self-pay | Admitting: Obstetrics & Gynecology

## 2017-07-29 ENCOUNTER — Telehealth: Payer: Self-pay

## 2017-07-29 VITALS — BP 121/86 | HR 82 | Temp 98.0°F | Resp 18 | Ht 71.5 in | Wt 188.0 lb

## 2017-07-29 DIAGNOSIS — R5383 Other fatigue: Secondary | ICD-10-CM | POA: Diagnosis not present

## 2017-07-29 DIAGNOSIS — N920 Excessive and frequent menstruation with regular cycle: Secondary | ICD-10-CM | POA: Diagnosis not present

## 2017-07-29 DIAGNOSIS — D500 Iron deficiency anemia secondary to blood loss (chronic): Secondary | ICD-10-CM

## 2017-07-29 DIAGNOSIS — Z79899 Other long term (current) drug therapy: Secondary | ICD-10-CM | POA: Insufficient documentation

## 2017-07-29 LAB — CBC WITH DIFFERENTIAL (CANCER CENTER ONLY)
Basophils Absolute: 0 10*3/uL (ref 0.0–0.1)
Basophils Relative: 0 %
Eosinophils Absolute: 0 10*3/uL (ref 0.0–0.5)
Eosinophils Relative: 1 %
HCT: 37 % (ref 34.8–46.6)
Hemoglobin: 12.2 g/dL (ref 11.6–15.9)
Lymphocytes Relative: 28 %
Lymphs Abs: 1.7 10*3/uL (ref 0.9–3.3)
MCH: 29.4 pg (ref 25.1–34.0)
MCHC: 33 g/dL (ref 31.5–36.0)
MCV: 89.2 fL (ref 79.5–101.0)
Monocytes Absolute: 0.4 10*3/uL (ref 0.1–0.9)
Monocytes Relative: 7 %
Neutro Abs: 3.8 10*3/uL (ref 1.5–6.5)
Neutrophils Relative %: 64 %
Platelet Count: 230 10*3/uL (ref 145–400)
RBC: 4.15 MIL/uL (ref 3.70–5.45)
RDW: 13.3 % (ref 11.2–14.5)
WBC Count: 5.9 10*3/uL (ref 3.9–10.3)

## 2017-07-29 NOTE — Progress Notes (Signed)
[facility] OFFICE PROGRESS NOTE  Christine Reach, MD [address] [place] [address]  DIAGNOSIS: 47 year old with iron deficiency anemia related to menorrhagia and menstrual blood losses. This was diagnosed in June 2017. Her hemoglobin was 11, iron 28 saturation 11%.  PRIOR THERAPY: She is status post Feraheme infusion for a total of 1000 mg completed on multiple occasions last of which in May 2018.  CURRENT THERAPY: Oral iron replacement.  INTERVAL HISTORY: Christine Vega 47 y.o. female returns for routine follow-up visit.  The patient reports that she is having increasing fatigue and no energy.  She is sleeping a lot.  She reports that IV iron makes her feel better.  She does report some lightheadedness and dizziness.  Iron without any complications.  Patient denies constipation, nausea, vomiting, hematochezia, and change in bowel habits.  She has not yet had a colonoscopy with plans to schedule one in the near future.  Patient denies blurred vision, seizures.  She has migraine headaches and she is followed by neurology.  She receives Botox.  Did not report any fevers, chills, night sweats.  Denies cough, shortness of breath, chest pain.  Denies urinary frequency or hesitancy.  Denies hematuria.  Denies skeletal complaints.  The remaining review of systems is negative.  The patient is here for evaluation repeat lab work.  MEDICAL HISTORY: Past Medical History:  Diagnosis Date  . Anemia    history   . Anxiety   . Back pain    rotates to left hip and tailbone  . Depression   . Endometriosis   . Fractured coccyx ([place])   . Heart murmur    no problems  . History of seasonal allergies   . Hyperlipidemia   . Migraine   . Migraines    tx w/depakote/verapamil per pt  . Murmur, heart   . 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 Post traumatic stress disorder (PTSD)   . Seizures ([place]) 2004   x 1; unknown source     ALLERGIES:  is allergic to penicillins; prednisone; and zithromax [azithromycin].  MEDICATIONS:  Current Outpatient Medications  Medication Sig Dispense Refill  . Botulinum Toxin Type A 200 units SOLR Inject 155 Units as directed as directed. 1 each 2  . buPROPion (WELLBUTRIN XL) 150 MG 24 hr tablet Take 150 mg by mouth daily.    . butalbital-acetaminophen-caffeine (FIORICET, ESGIC) 50-325-40 MG tablet Take 1 tablet by mouth every 8 (eight) hours as needed for migraine.    . cetirizine (ZYRTEC) 10 MG tablet Take 10 mg by mouth daily as needed for allergies.    Marland Kitchen divalproex (DEPAKOTE) 500 MG DR tablet TAKE 2 TABLETS BY MOUTH TWICE DAILY 120 tablet 2  . eletriptan (RELPAX) 40 MG tablet Take 1 tablet (40 mg total) as needed by mouth for migraine or headache. May repeat in 2 hours if headache persists or recurs. No more than 2 in 24 hours 10 tablet 3  . fluconazole (DIFLUCAN) 150 MG tablet TAKE 1 TABLET BY MOUTH ONCE; MAY TAKE ADDITIONAL DOSE 3 DAYS LATER IF SYMPTOMS PERSIST 1 tablet 2  . gabapentin (NEURONTIN) 300 MG capsule Take 300 mg by mouth 3 (three) times daily.    Marland Kitchen HYDROcodone-acetaminophen (NORCO/VICODIN) 5-325 MG tablet TK 1 T PO Q 6 H  0  . hydrOXYzine (VISTARIL) 25 MG capsule Take 25 mg by mouth 3 (three) times daily as needed for anxiety.    Marland Kitchen lubiprostone (  AMITIZA) 24 MCG capsule Take 24 mcg by mouth 2 (two) times daily with a meal.    . prochlorperazine (COMPAZINE) 10 MG tablet Take 10 mg by mouth every 6 (six) hours as needed for nausea or vomiting.    . promethazine (PROMETHEGAN) 50 MG suppository Place 50 mg rectally every 8 (eight) hours as needed for nausea or vomiting.     . propranolol ER (INDERAL LA) 120 MG 24 hr capsule Take 120 mg by mouth daily.    . sertraline (ZOLOFT) 100 MG tablet Take 100 mg by mouth daily.    . simvastatin (ZOCOR) 20 MG tablet TK 1 T PO D  1  . tiZANidine (ZANAFLEX) 4 MG tablet Take 4 mg by mouth every 12 (twelve) hours as needed for muscle  spasms.      No current facility-administered medications for this visit.     SURGICAL HISTORY:  Past Surgical History:  Procedure Laterality Date  . CHOLECYSTECTOMY    . HERNIA REPAIR    . HYSTERECTOMY ABDOMINAL WITH SALPINGECTOMY Left 02/07/2016   Procedure: HYSTERECTOMY ABDOMINAL WITH  left SALPINGECTOMY;  Surgeon: Mora Bellman, MD;  Location:  [facility];  Service: Gynecology;  Laterality: Left;  . LAPAROSCOPY  05/16/2011   Procedure: LAPAROSCOPY OPERATIVE;  Surgeon: Emeterio Reeve, MD;  Location: [facility];  Service: Gynecology;  Laterality: N/A;  poss oophorectomy  . LAPAROSCOPY  08/06/2011   Procedure: LAPAROSCOPY OPERATIVE;  Surgeon: Woodroe Mode, MD;  Location: [place] [facility];  Service: Gynecology;  Laterality: N/A;  . LAPAROTOMY     for endometriosis/adhesions  . SALPINGOOPHORECTOMY  08/06/2011   Procedure: SALPINGO OOPHERECTOMY;  Surgeon: Woodroe Mode, MD;  Location: [facility];  Service: Gynecology;  Laterality: Right;  . SVD     x 2  . TUBAL LIGATION    . WISDOM TOOTH EXTRACTION      REVIEW OF SYSTEMS:   Review of Systems  Constitutional: Negative for appetite change, chills, fever and unexpected weight change. Positive for fatigue. HENT:   Negative for mouth sores, nosebleeds, sore throat and trouble swallowing.   Eyes: Negative for eye problems and icterus.  Respiratory: Negative for cough, hemoptysis, shortness of breath and wheezing.   Cardiovascular: Negative for chest pain and leg swelling.  Gastrointestinal: Negative for abdominal pain, constipation, diarrhea, nausea and vomiting.  Genitourinary: Negative for bladder incontinence, difficulty urinating, dysuria, frequency and hematuria.   Musculoskeletal: Negative for back pain, gait problem, neck pain and neck stiffness.  Skin: Negative for itching and rash.  Neurological: Negative for dizziness, extremity weakness, gait problem, light-headedness and seizures. She has migraine headaches.  She is followed by  neurology. Hematological: Negative for adenopathy. Does not bruise/bleed easily.  Psychiatric/Behavioral: Negative for confusion, depression and sleep disturbance. The patient is not nervous/anxious.     PHYSICAL EXAMINATION:  Blood pressure 121/86, pulse 82, temperature 98 F (36.7 C), temperature source Oral, resp. rate 18, height 5' 11.5" (1.816 m), weight 188 lb (85.3 kg), last menstrual period 01/13/2016, SpO2 100 %.  ECOG PERFORMANCE STATUS: 1 - Symptomatic but completely ambulatory  Physical Exam  Constitutional: Oriented to person, place, and time and well-developed, well-nourished, and in no distress. No distress.  HENT:  Head: Normocephalic and atraumatic.  Mouth/Throat: Oropharynx is clear and moist. No oropharyngeal exudate.  Eyes: Conjunctivae are normal. Right eye exhibits no discharge. Left eye exhibits no discharge. No scleral icterus.  Neck: Normal range of motion. Neck supple.  Cardiovascular: Normal rate, regular rhythm, normal heart sounds and intact distal pulses.  Pulmonary/Chest: Effort normal and breath sounds normal. No respiratory distress. No wheezes. No rales.  Abdominal: Soft. Bowel sounds are normal. Exhibits no distension and no mass. There is no tenderness.  Musculoskeletal: Normal range of motion. Exhibits no edema.  Lymphadenopathy:    No cervical adenopathy.  Neurological: Alert and oriented to person, place, and time. Exhibits normal muscle tone. Gait normal. Coordination normal.  Skin: Skin is warm and dry. No rash noted. Not diaphoretic. No erythema. No pallor.  Psychiatric: Mood, memory and judgment normal.  Vitals reviewed.  LABORATORY DATA: Lab Results  Component Value Date   WBC 5.9 07/29/2017   HGB 11.8 01/15/2017   HCT 37.0 07/29/2017   MCV 89.2 07/29/2017   PLT 230 07/29/2017      Chemistry      Component Value Date/Time   NA 137 05/16/2014 1816   K 4.2 05/16/2014 1816   CL 103 05/16/2014 1816   CO2 27 05/16/2014 1816   BUN 7  05/16/2014 1816   CREATININE 0.73 05/16/2014 1816      Component Value Date/Time   CALCIUM 9.5 05/16/2014 1816   ALKPHOS 52 05/16/2014 1816   AST 16 05/16/2014 1816   ALT 13 05/16/2014 1816   BILITOT 0.2 (L) 05/16/2014 1816       RADIOGRAPHIC STUDIES:  No results found.   ASSESSMENT/PLAN:  47 year old woman with the following issues:  1. Iron deficiency anemia documented on multiple occasions.  Her hemoglobin today is 12.2 with a normal MCV.  Iron studies from today are pending.  Her last ferritin was 1181 on 06/06/2017.  Percent saturation was 27%, iron was 89, TIBC was 329, and U IBC was 240 on that same date.  She is status post Feraheme infusion on previous occasions last of which in May 2018.  The patient is having increased fatigue and sleepiness.  Will await her iron studies that are pending today.  The plan is to proceed with oral iron maintenance at this time.  May consider repeating IV iron if iron studies are low.  2. Colon cancer screening: She does not have any GI symptoms and her iron deficiency have been clearly related to menstrual blood losses. She is at age that colon cancer screening is recommended. I recommended she proceed with colonoscopy in the near future.   3. Follow-up: Will be in 6 months to repeat iron studies and replace iron as needed.  Orders Placed This Encounter  Procedures  . CBC with Differential (Cancer Center Only)    Standing Status:   Future    Standing Expiration Date:   07/30/2018  . Ferritin    Standing Status:   Future    Standing Expiration Date:   07/30/2018  . Iron and TIBC    Standing Status:   Future    Standing Expiration Date:   07/30/2018   Mikey Bussing, DNP, AGPCNP-BC, AOCNP 07/29/17

## 2017-07-29 NOTE — Telephone Encounter (Signed)
Printed avs and calender of upcoming appointment. Per 4/15 los 

## 2017-07-30 ENCOUNTER — Telehealth: Payer: Self-pay | Admitting: *Deleted

## 2017-07-30 LAB — IRON AND TIBC
Iron: 84 ug/dL (ref 41–142)
Saturation Ratios: 26 % (ref 21–57)
TIBC: 320 ug/dL (ref 236–444)
UIBC: 236 ug/dL

## 2017-07-30 LAB — FERRITIN: Ferritin: 740 ng/mL — ABNORMAL HIGH (ref 9–269)

## 2017-07-30 NOTE — Telephone Encounter (Signed)
Per [name], NP made patient aware that her Ferritin levels remains elevated. This was reviewed with Christine Blew, MD and no IV iron is needed at this time. However, she may continue oral iron supplement. If low energy and fatigue persist a  f/u with PCP is recommended. Patient verbalized understanding.

## 2017-07-31 ENCOUNTER — Encounter (HOSPITAL_COMMUNITY): Payer: Self-pay | Admitting: Family Medicine

## 2017-07-31 ENCOUNTER — Other Ambulatory Visit: Payer: Self-pay

## 2017-07-31 ENCOUNTER — Ambulatory Visit (HOSPITAL_COMMUNITY)
Admission: EM | Admit: 2017-07-31 | Discharge: 2017-07-31 | Disposition: A | Discharge status: Home / Self Care | Payer: Medicaid Other | Attending: Urgent Care | Admitting: Urgent Care

## 2017-07-31 ENCOUNTER — Telehealth: Payer: Self-pay | Admitting: Neurology

## 2017-07-31 DIAGNOSIS — M545 Low back pain, unspecified: Secondary | ICD-10-CM

## 2017-07-31 DIAGNOSIS — M542 Cervicalgia: Secondary | ICD-10-CM | POA: Diagnosis not present

## 2017-07-31 DIAGNOSIS — S0093XA Contusion of unspecified part of head, initial encounter: Secondary | ICD-10-CM

## 2017-07-31 DIAGNOSIS — M62830 Muscle spasm of back: Secondary | ICD-10-CM

## 2017-07-31 MED ORDER — KETOROLAC TROMETHAMINE 60 MG/2ML IM SOLN
60.0000 mg | Freq: Once | INTRAMUSCULAR | Status: AC
  Administered 2017-07-31: 60 mg via INTRAMUSCULAR

## 2017-07-31 MED ORDER — CYCLOBENZAPRINE HCL 5 MG PO TABS: 5.0000 mg | ORAL_TABLET | Freq: Three times a day (TID) | ORAL | 0 refills | Status: DC | PRN

## 2017-07-31 MED ORDER — KETOROLAC TROMETHAMINE 60 MG/2ML IM SOLN
INTRAMUSCULAR | Status: AC
  Filled 2017-07-31: qty 2

## 2017-07-31 MED ORDER — MELOXICAM 15 MG PO TABS: 15.0000 mg | ORAL_TABLET | Freq: Every day | ORAL | 1 refills | Status: DC

## 2017-07-31 MED ORDER — ELETRIPTAN HYDROBROMIDE 40 MG PO TABS: 40.0000 mg | ORAL_TABLET | ORAL | 3 refills | Status: DC | PRN
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
## 2017-07-31 NOTE — Discharge Instructions (Signed)
Hydrate well with at least 2 liters (1 gallon) of water daily.  °

## 2017-07-31 NOTE — Telephone Encounter (Signed)
Called and spoke with Will, gave verbal. I faxed Rx earlier, I neglected to have Dr Tomi Likens sign the Rx.

## 2017-07-31 NOTE — Telephone Encounter (Signed)
Levada Dy called from [facility] needing to get a signature on a prescription that was sent in. Please Call [phone]. Could not make out the name of the medication that was left on the voicemail. Thanks

## 2017-08-01 ENCOUNTER — Other Ambulatory Visit: Payer: Self-pay | Admitting: Urgent Care

## 2017-08-09 ENCOUNTER — Encounter: Payer: Self-pay | Admitting: Neurology

## 2017-08-13 ENCOUNTER — Encounter: Payer: Self-pay | Admitting: Neurology

## 2017-08-18 ENCOUNTER — Encounter

## 2017-08-23 ENCOUNTER — Ambulatory Visit (INDEPENDENT_AMBULATORY_CARE_PROVIDER_SITE_OTHER): Payer: Medicaid Other | Admitting: Obstetrics and Gynecology

## 2017-08-23 ENCOUNTER — Encounter: Payer: Self-pay | Admitting: Obstetrics and Gynecology

## 2017-08-23 VITALS — BP 123/78 | HR 72 | Wt 190.0 lb

## 2017-08-23 DIAGNOSIS — B373 Candidiasis of vulva and vagina: Secondary | ICD-10-CM

## 2017-08-23 DIAGNOSIS — B3731 Acute candidiasis of vulva and vagina: Secondary | ICD-10-CM

## 2017-08-23 MED ORDER — FLUCONAZOLE 150 MG PO TABS: ORAL_TABLET | ORAL | 1 refills | Status: DC

## 2017-08-23 NOTE — Progress Notes (Signed)
Obstetrics and Gynecology Visit Return Patient Evaluation  Appointment Date: 08/23/2017  Primary Care Provider: [name]  Referring Provider: self  Chief Complaint: 1wk of vaginal irritation and itching  History of Present Illness:  Christine Vega is a 47 y.o. with above s/s. H/o TAH. Tried OTC cream (last used last night) but no relief. Usually goes away with PO meds and feels similar to prior yeast infections. Left vulva feels irritated  Review of Systems:  as noted in the History of Present Illness.  Medications and Allergies: reviewed  Physical Exam:  BP 123/78   Pulse 72   Wt 190 lb (86.2 kg)   LMP 01/13/2016 (Exact Date)   BMI 26.13 kg/m  Body mass index is 26.13 kg/m. General appearance: Well nourished, well developed female in no acute distress.  Abdomen: diffusely non tender to palpation, non distended, and no masses, hernias Neuro/Psych:  Normal mood and affect.    Pelvic exam:  EGBUS with slightly edematous and ttp left vulva and white cottage cheese like d/c there and in vault. Cuff intact and nttp.   Assessment: VVC  Plan:  1. Vulvovaginal candidiasis Will do diflucan 150 now and repeat in 3 days. If no relief by end of next week, pt told to let us know and can have her do a self swab. Also advised to keep area dry.    RTC: PRN  Durene Romans MD Attending Center for [facility])

## 2017-08-26 ENCOUNTER — Ambulatory Visit: Payer: Medicaid Other | Admitting: Physical Therapy

## 2017-08-27 ENCOUNTER — Ambulatory Visit: Payer: Medicaid Other | Attending: Primary Care | Admitting: Physical Therapy

## 2017-08-27 ENCOUNTER — Other Ambulatory Visit: Payer: Self-pay | Admitting: Neurology

## 2017-08-27 DIAGNOSIS — M5440 Lumbago with sciatica, unspecified side: Secondary | ICD-10-CM

## 2017-08-27 DIAGNOSIS — R252 Cramp and spasm: Secondary | ICD-10-CM | POA: Diagnosis present

## 2017-08-27 DIAGNOSIS — M542 Cervicalgia: Secondary | ICD-10-CM

## 2017-08-27 DIAGNOSIS — M6281 Muscle weakness (generalized): Secondary | ICD-10-CM

## 2017-08-27 DIAGNOSIS — R262 Difficulty in walking, not elsewhere classified: Secondary | ICD-10-CM | POA: Diagnosis present

## 2017-08-27 NOTE — Patient Instructions (Signed)
   http://orth.exer.us/134   Copyright  VHI. All rights reserved. Knee to Chest (Flexion)   Pull knee toward chest. Feel stretch in lower back or buttock area. Breathing deeply, Hold _10___ seconds. Repeat with other knee. Repeat __5__ times. Do __3__ sessions per day.  http://[email or web address].us/225   Copyright  VHI. All rights reserved.   Lower Trunk Rotation Stretch   Keeping back flat and feet together, rotate knees to left side. Hold _5___ seconds. Repeat _10___ times per set. Do ____ sets per session. Do __3__ sessions per day.  http://orth.[email or web address].us/122      AROM: Neck Rotation   Turn head slowly to look over one shoulder, then the other. Hold each position _5___ seconds. Repeat _5___ times per set. Do __1__ sets per session. Do __5__ sessions per day.  http://orth.exer.us/294   Copyright  VHI. All rights reserved.  AROM: Lateral Neck Flexion   Slowly tilt head toward one shoulder, then the other. Hold each position __5__ seconds. Repeat __5__ times per set. Do _1___ sets per session. Do _5___ sessions per day.  http://orth.exer.us/296   Copyright  VHI. All rights reserved.  Extension   Hands behind neck, bend head back as far as is comfortable. Hold __5__ seconds. Repeat _5___ times. Do __5__ sessions per day.  Copyright  VHI. All rights reserved.  AROM: Neck Flexion   Bend head forward. Hold _5___ seconds. Repeat __5__ times per set. Do __1__ sets per session. Do _5___ sessions per day.  No Grimacing Tricia and use moist heat with exercise  http://orth.exer.us/298   Copyright  VHI. All rights reserved.  Voncille Lo, PT Certified Exercise Expert for the Aging Adult  08/27/17 9:28 AM Phone: [phone] Fax: [phone]
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
## 2017-09-05 ENCOUNTER — Telehealth: Payer: Self-pay | Admitting: Neurology

## 2017-09-05 ENCOUNTER — Ambulatory Visit: Payer: Medicaid Other | Admitting: Physical Therapy

## 2017-09-05 NOTE — Telephone Encounter (Signed)
Patient called to say that she had a migraine and that she has been sick. She is not wanting to go to Physical Therapy this afternoon at 2:00 PM. She is requesting that we call them or note that information. I told her I'm unable to do that since the appt is not scheduled here. She would like you to call her. Thamk

## 2017-09-05 NOTE — Telephone Encounter (Signed)
Thank you :)

## 2017-09-05 NOTE — Telephone Encounter (Addendum)
Called and spoke with Pt, she has a severe migraine and is unable to get out of the bed. I advised her she will need to call P/T to let them know she is not coming. Pt has already called them. I asked her if she has taken any medication for her headache, she said she has and is waiting for it to take effect.

## 2017-09-12 ENCOUNTER — Ambulatory Visit: Payer: Medicaid Other | Admitting: Physical Therapy

## 2017-09-12 ENCOUNTER — Encounter: Payer: Self-pay | Admitting: Physical Therapy

## 2017-09-12 DIAGNOSIS — R262 Difficulty in walking, not elsewhere classified: Secondary | ICD-10-CM

## 2017-09-12 DIAGNOSIS — R252 Cramp and spasm: Secondary | ICD-10-CM

## 2017-09-12 DIAGNOSIS — M6281 Muscle weakness (generalized): Secondary | ICD-10-CM

## 2017-09-12 DIAGNOSIS — M542 Cervicalgia: Secondary | ICD-10-CM

## 2017-09-12 DIAGNOSIS — M5440 Lumbago with sciatica, unspecified side: Secondary | ICD-10-CM
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
## 2017-09-19 ENCOUNTER — Encounter: Payer: Self-pay | Admitting: Physical Therapy

## 2017-09-19 ENCOUNTER — Ambulatory Visit: Payer: Medicaid Other | Attending: Primary Care | Admitting: Physical Therapy

## 2017-09-19 DIAGNOSIS — M542 Cervicalgia: Secondary | ICD-10-CM

## 2017-09-19 DIAGNOSIS — R252 Cramp and spasm: Secondary | ICD-10-CM | POA: Diagnosis present

## 2017-09-19 DIAGNOSIS — M5440 Lumbago with sciatica, unspecified side: Secondary | ICD-10-CM | POA: Diagnosis present

## 2017-09-19 DIAGNOSIS — M6281 Muscle weakness (generalized): Secondary | ICD-10-CM | POA: Diagnosis present

## 2017-09-19 DIAGNOSIS — R262 Difficulty in walking, not elsewhere classified: Secondary | ICD-10-CM

## 2017-09-20 ENCOUNTER — Other Ambulatory Visit: Payer: Self-pay

## 2017-09-20 ENCOUNTER — Encounter: Payer: Self-pay | Admitting: Physical Therapy

## 2017-09-20 MED ORDER — FLUCONAZOLE 150 MG PO TABS: ORAL_TABLET | ORAL | 1 refills | Status: DC

## 2017-09-20 NOTE — Therapy (Signed)
[place], [place], [address] Phone: [phone]   Fax:  [phone]  Physical Therapy Treatment  Patient Details  Name: Christine Vega MRN: [id number] Date of Birth: [birth year] Referring Provider: Kerin Perna NP ( DR Mcarthur Rossetti)   Encounter Date: 09/19/2017  PT End of Session - 09/19/17 1606    Visit Number  3    Number of Visits  13    Date for PT Re-Evaluation  10/08/17    Authorization Type  self pay MVA ( Medicaid does not want to utilize)    PT Start Time  1545    PT Stop Time  1639 Only 25 minutes of treatment 2nd to patient deciding wether or not she was go continue with therapy    PT Time Calculation (min)  54 min    Activity Tolerance  Patient limited by pain    Behavior During Therapy  [facility] for tasks assessed/performed       Past Medical History:  Diagnosis Date  . Anemia    history   . Anxiety   . Back pain    rotates to left hip and tailbone  . Depression   . DUB (dysfunctional uterine bleeding) 12/01/2010  . Endometriosis   . Fractured coccyx ([place])   . Heart murmur    no problems  . History of seasonal allergies   . Hyperlipidemia   . Migraine   . Migraines    tx w/depakote/verapamil per pt  . Murmur, heart   . 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 Post traumatic stress disorder (PTSD)   . Seizures ([place]) 2004   x 1; unknown source  . Uterine leiomyoma     Past Surgical History:  Procedure Laterality Date  . CHOLECYSTECTOMY    . HERNIA REPAIR    . HYSTERECTOMY ABDOMINAL WITH SALPINGECTOMY Left 02/07/2016   Procedure: HYSTERECTOMY ABDOMINAL WITH  left SALPINGECTOMY;  Surgeon: Mora Bellman, MD;  Location: [facility];  Service: Gynecology;  Laterality: Left;  . LAPAROSCOPY  05/16/2011   Procedure: LAPAROSCOPY OPERATIVE;  Surgeon: Emeterio Reeve, MD;  Location: [facility];  Service: Gynecology;  Laterality: N/A;  poss oophorectomy  .  LAPAROSCOPY  08/06/2011   Procedure: LAPAROSCOPY OPERATIVE;  Surgeon: Woodroe Mode, MD;  Location: [facility];  Service: Gynecology;  Laterality: N/A;  . LAPAROTOMY     for endometriosis/adhesions  . SALPINGOOPHORECTOMY  08/06/2011   Procedure: SALPINGO OOPHERECTOMY;  Surgeon: Woodroe Mode, MD;  Location: [facility];  Service: Gynecology;  Laterality: Right;  . SVD     x 2  . TUBAL LIGATION    . WISDOM TOOTH EXTRACTION      There were no vitals filed for this visit.  Subjective Assessment - 09/19/17 1552    Subjective  Patient reports she is no better. She does not belive therapy is working. Therapy asked her what exercises she is doing at home. She reports she is doing her abdominal breathing and what looks like a long arc quad which she was not given. It also appears she is doing a tennis ball trigger point release which is beneficial but not part of her HEP. She is requesting discharge to return to her MD.     Pertinent History  neuromuscular disorder.  coccyx fx 2008, heart murmer. See medical history    Limitations  Sitting;Standing;Walking;House hold activities    How long can you sit comfortably?  15 minutes  I try to get up and walk around because my pain increases    How long can you stand comfortably?  10 minutes    How long can you walk comfortably?  33minutes    Diagnostic tests  x ray    Patient Stated Goals  Stop pain, move better,     Currently in Pain?  Yes    Pain Score  9     Pain Location  Neck    Pain Orientation  Right;Left;Mid    Pain Descriptors / Indicators  Aching    Pain Type  Acute pain    Pain Onset  More than a month ago    Pain Frequency  Constant    Aggravating Factors   nothing     Pain Relieving Factors  nothing     Effect of Pain on Daily Activities  difficulty yturning her head     Pain Score  9    Pain Orientation  Right    Pain Descriptors / Indicators  Aching    Pain Type  Acute pain    Pain Onset  1 to 4 weeks ago    Pain Frequency  Constant     Aggravating Factors   standing and walking     Pain Relieving Factors  nohing     Effect of Pain on Daily Activities  difficulty perfroming ADL's and IADL;s                        [facility] Adult PT Treatment/Exercise - 09/20/17 0001      Neck Exercises: Seated   Other Seated Exercise  scap retractions 3x10       Lumbar Exercises: Stretches   Passive Hamstring Stretch  Limitations    Passive Hamstring Stretch Limitations  seated hamstring stretch 3x20 sec hold moderate cuing for technique     Lower Trunk Rotation Limitations  10x2 sec hold     Other Lumbar Stretch Exercise  piriformis stretch with [place] 3x30 sec hold bilateral       Lumbar Exercises: Standing   Other Standing Lumbar Exercises  wall posture 3x5 with mod cuing       Lumbar Exercises: Seated   Other Seated Lumbar Exercises  ball squeeze 2x10; yellow band clam shell in low range 2x10       Manual Therapy   Manual therapy comments  gentle LAD bilateral 4x20 sec hold each leg               PT Short Term Goals - 08/27/17 1238      PT SHORT TERM GOAL #1   Title  "Independent with initial HEP    Time  3    Period  Weeks    Status  New    Target Date  09/17/17      PT SHORT TERM GOAL #2   Title  "Report pain decrease from 9/10 to  5/10.    Time  3    Period  Weeks    Status  New    Target Date  09/17/17      PT SHORT TERM GOAL #3   Title  "Demonstrate understanding of proper sitting posture and be more conscious of position and posture throughout the day.     Baseline  Pt with gaurded and flexed trunk posture    Time  3    Period  Weeks    Status  New    Target Date  09/17/17  PT Long Term Goals - 08/27/17 1239      PT LONG TERM GOAL #1   Title  "Demonstrate and verbalize techniques to reduce the risk of re-injury including: lifting, posture, body mechanics.     Time  6    Period  Weeks    Status  New    Target Date  10/08/17      PT LONG TERM GOAL #2   Title  "Pt will  be independent with advanced HEP.     Time  6    Period  Weeks    Status  New    Target Date  10/08/17      PT LONG TERM GOAL #3   Title  "FOTO will improve from  89%limitation  to 58% limitation   indicating improved functional mobility.     Time  6    Period  Weeks    Status  New    Target Date  10/08/17      PT LONG TERM GOAL #4   Title  Pt will increase AROM of LE's in order to don and doff shoes/socks independently    Baseline  Pt unable to bring heel to lower 1/3 of shin bil    Time  6    Period  Weeks    Status  New    Target Date  10/08/17      PT LONG TERM GOAL #5   Title  Pt will be able to sleep at night for 4 or more hours uniniterrupted restorative sleep    Baseline  not able to sleep more than 2 hours at night    Time  6    Period  Weeks    Status  New    Target Date  10/08/17      PT LONG TERM GOAL #6   Title  Pt will be able to sit for 30 minutes and drive and be able to turn neck without exacerbating pain greater than 3/10 pain    Baseline  1.71Ft/sec at eval and antalgic gait    Time  6    Period  Weeks    Status  New    Target Date  10/08/17      PT LONG TERM GOAL #7   Title  Pt will be able to walk without antalgic gait and LRAD at gait velocity of at least 2.62Ft/sec    Time  6    Period  Weeks    Status  New    Target Date  10/08/17            Plan - 09/20/17 1315    Clinical Impression Statement  Initially patient requested to discharge. Therapy explained to her that one visit likely was not enough to effect her pain but she did not belive that she could do it. During discharge instructions patient decided to contact her attourney. Her attourney advised her to continue with PT. Therapy adivsed her that if she does not think that she will be able to participate in therapy there is no need to come back. She reports she will try one more visit. Significant time spent talking with the patient about wether or not she was going to continue. Limited  treatment session today. Therapy added long axis distraction to bilateral lower extremitys to decmoress her back. Therapy also added in ball squeeze and hip abduction. She was not given this for home. Therapy ewould like her to just focus on the four exercises that  were given to her last time. She required moderate cuing for the exercises that were given to her last time. She will continue for 1 more visit.     Clinical Presentation  Evolving    Clinical Decision Making  Moderate    Rehab Potential  Good    PT Frequency  2x / week    PT Duration  6 weeks    PT Treatment/Interventions  ADLs/Self Care Home Management;Cryotherapy;Electrical Stimulation;Iontophoresis 4mg /ml Dexamethasone;Moist Heat;Traction;Ultrasound;Therapeutic exercise;Therapeutic activities;Functional mobility training;Stair training;Gait training;Neuromuscular re-education;Patient/family education;Manual techniques;Passive range of motion;Dry needling;Taping    PT Next Visit Plan  flexibility and manual for whiplash and low back quadratus lumborum spasm    PT Home Exercise Plan  trunk rotation, SKTC education on quadratus lumbar and AROM of cervcial all planes and moist heat; quad sets, glut sets, abdominal breathing, scpa retraction     Consulted and Agree with Plan of Care  Patient       Patient will benefit from skilled therapeutic intervention in order to improve the following deficits and impairments:  Postural dysfunction, Improper body mechanics, Pain, Impaired UE functional use, Impaired flexibility, Increased muscle spasms, Difficulty walking, Decreased strength, Decreased mobility, Decreased range of motion  Visit Diagnosis: Acute bilateral low back pain with sciatica, sciatica laterality unspecified  Muscle weakness (generalized)  Cramp and spasm  Cervicalgia  Difficulty in walking, not elsewhere classified     Problem List Patient Active Problem List   Diagnosis Date Noted  . Iron deficiency anemia due to  chronic blood loss 04/05/2016  . 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 Pain, female pelvic 12/01/2010    Carney Living PT DPT  09/20/2017, 1:25 PM  [facility] [address] [place], [place], [address] Phone: [phone]   Fax:  [phone]  Name: CHRYS LANDGREBE MRN: [id number] Date of Birth: Jan 30, 1971

## 2017-09-20 NOTE — Telephone Encounter (Signed)
Patient is requesting a diflucan be called into her pharmacy. 

## 2017-09-26 ENCOUNTER — Ambulatory Visit: Payer: Medicaid Other | Admitting: Physical Therapy

## 2017-09-26 DIAGNOSIS — M5440 Lumbago with sciatica, unspecified side: Secondary | ICD-10-CM

## 2017-09-26 DIAGNOSIS — R262 Difficulty in walking, not elsewhere classified: Secondary | ICD-10-CM

## 2017-09-26 DIAGNOSIS — M6281 Muscle weakness (generalized): Secondary | ICD-10-CM

## 2017-09-26 DIAGNOSIS — R252 Cramp and spasm: Secondary | ICD-10-CM

## 2017-09-26 DIAGNOSIS — M542 Cervicalgia: Secondary | ICD-10-CM

## 2017-09-27 ENCOUNTER — Encounter: Payer: Self-pay | Admitting: Physical Therapy

## 2017-09-27 NOTE — Therapy (Addendum)
[place], [place], [address] Phone: [phone]   Fax:  [phone]  Physical Therapy Treatment/Discharge Note  Patient Details  Name: Christine Vega MRN: [id number] Date of Birth: 1970/12/22 Referring Provider: Kerin Perna NP ( DR Mcarthur Rossetti)   Encounter Date: 09/26/2017  PT End of Session - 09/27/17 1039    Visit Number  4    Number of Visits  13    Date for PT Re-Evaluation  10/08/17    Authorization Type  self pay MVA ( Medicaid does not want to utilize)    PT Start Time  1552    PT Stop Time  1640    PT Time Calculation (min)  48 min    Activity Tolerance  Patient limited by pain    Behavior During Therapy  [facility] for tasks assessed/performed       Past Medical History:  Diagnosis Date  . Anemia    history   . Anxiety   . Back pain    rotates to left hip and tailbone  . Depression   . DUB (dysfunctional uterine bleeding) 12/01/2010  . Endometriosis   . Fractured coccyx ([place])   . Heart murmur    no problems  . History of seasonal allergies   . Hyperlipidemia   . Migraine   . Migraines    tx w/depakote/verapamil per pt  . Murmur, heart   . Neuromuscular disorder ([place])    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, walks/balance ok  . Post traumatic stress disorder (PTSD)   . Seizures ([place]) 2004   x 1; unknown source  . Uterine leiomyoma     Past Surgical History:  Procedure Laterality Date  . CHOLECYSTECTOMY    . HERNIA REPAIR    . HYSTERECTOMY ABDOMINAL WITH SALPINGECTOMY Left 02/07/2016   Procedure: HYSTERECTOMY ABDOMINAL WITH  left SALPINGECTOMY;  Surgeon: Mora Bellman, MD;  Location: [facility];  Service: Gynecology;  Laterality: Left;  . LAPAROSCOPY  05/16/2011   Procedure: LAPAROSCOPY OPERATIVE;  Surgeon: Emeterio Reeve, MD;  Location: [facility];  Service: Gynecology;  Laterality: N/A;  poss oophorectomy  . LAPAROSCOPY  08/06/2011   Procedure: LAPAROSCOPY OPERATIVE;  Surgeon: Woodroe Mode, MD;   Location: [facility];  Service: Gynecology;  Laterality: N/A;  . LAPAROTOMY     for endometriosis/adhesions  . SALPINGOOPHORECTOMY  08/06/2011   Procedure: SALPINGO OOPHERECTOMY;  Surgeon: Woodroe Mode, MD;  Location: [facility];  Service: Gynecology;  Laterality: Right;  . SVD     x 2  . TUBAL LIGATION    . WISDOM TOOTH EXTRACTION      There were no vitals filed for this visit.  Subjective Assessment - 09/26/17 1618    Subjective  Patient continues to report no improvement. She is working on her exercises at home. Today she has a migrane. She reports the her headache is around a 10/10. She had difficulty driving over. She was strongly advised that if she is unsafe driving she should cancel her appointment.     Pertinent History  neuromuscular disorder.  coccyx fx 2008, heart murmer. See medical history    Limitations  Sitting;Standing;Walking;House hold activities    How long can you sit comfortably?  15 minutes  I try to get up and walk around because my pain increases    How long can you stand comfortably?  10 minutes    How long can you walk comfortably?  4mnutes    Diagnostic tests  x  ray    Patient Stated Goals  Stop pain, move better,     Pain Score  9     Pain Location  Neck    Pain Orientation  Right;Left;Mid    Pain Descriptors / Indicators  Aching    Pain Type  Acute pain    Pain Onset  More than a month ago    Pain Frequency  Constant    Aggravating Factors   nothing     Pain Relieving Factors  nothing     Effect of Pain on Daily Activities  difficulty turning head     Pain Score  9    Pain Location  Back    Pain Orientation  Left    Pain Descriptors / Indicators  Aching    Pain Type  Acute pain    Pain Radiating Towards  radiating down her right leg     Pain Onset  1 to 4 weeks ago    Aggravating Factors   standing and walking     Pain Relieving Factors  nothing     Effect of Pain on Daily Activities  difficulty perfroming ADL's                         [facility] Adult PT Treatment/Exercise - 09/27/17 0001      Self-Care   Posture  reviewed the improtance of posture       Neck Exercises: Seated   Other Seated Exercise  scap retractions 3x10       Lumbar Exercises: Stretches   Passive Hamstring Stretch  Limitations    Passive Hamstring Stretch Limitations  seated hamstring stretch 3x20 sec hold moderate cuing for technique     Lower Trunk Rotation Limitations  10x2 sec hold       Lumbar Exercises: Seated   Other Seated Lumbar Exercises  seated large ball roll forward to stretch lumbar spine x10     Other Seated Lumbar Exercises  ball squeeze 2x10; yellow band clam shell in low range 2x10       Manual Therapy   Manual Therapy  Soft tissue mobilization    Manual therapy comments  gentle LAD bilateral 4x20 sec hold each leg    Soft tissue mobilization  to bilateral upper traps and cervical paraspianls; sub occipital release to upper traps;               PT Education - 09/27/17 1038    Education Details  benefits of manual therapy to decompress the spine     Person(s) Educated  Patient    Methods  Explanation;Demonstration;Tactile cues;Verbal cues    Comprehension  Verbalized understanding;Returned demonstration;Verbal cues required;Tactile cues required       PT Short Term Goals - 09/27/17 1043      PT SHORT TERM GOAL #1   Title  "Independent with initial HEP    Baseline  Patient reports she is perfroming at home. Continues to require cuing at PT     Time  3    Period  Weeks    Status  On-going      PT SHORT TERM GOAL #2   Title  "Report pain decrease from 9/10 to  5/10.    Baseline  still 9/10     Time  3    Period  Weeks    Status  On-going      PT SHORT TERM GOAL #3   Title  "Demonstrate understanding of proper sitting   posture and be more conscious of position and posture throughout the day.     Baseline  Pt with gaurded and flexed trunk posture    Time  3    Period  Weeks     Status  On-going        PT Long Term Goals - 09/27/17 1044      PT LONG TERM GOAL #1   Title  "Demonstrate and verbalize techniques to reduce the risk of re-injury including: lifting, posture, body mechanics.     Time  6    Period  Weeks    Status  On-going      PT LONG TERM GOAL #2   Title  "Pt will be independent with advanced HEP.     Time  6    Period  Weeks    Status  On-going      PT LONG TERM GOAL #3   Title  "FOTO will improve from  89%limitation  to 58% limitation   indicating improved functional mobility.     Time  6    Period  Weeks    Status  On-going      PT LONG TERM GOAL #4   Title  Pt will increase AROM of LE's in order to don and doff shoes/socks independently    Baseline  Pt unable to bring heel to lower 1/3 of shin bil    Time  6    Period  Weeks    Status  On-going      PT LONG TERM GOAL #5   Title  Pt will be able to sleep at night for 4 or more hours uniniterrupted restorative sleep    Baseline  not able to sleep more than 2 hours at night    Time  6    Period  Weeks    Status  On-going      PT LONG TERM GOAL #6   Title  Pt will be able to sit for 30 minutes and drive and be able to turn neck without exacerbating pain greater than 3/10 pain    Baseline  1.71Ft/sec at eval and antalgic gait    Time  6    Period  Weeks    Status  On-going      PT LONG TERM GOAL #7   Title  Pt will be able to walk without antalgic gait and LRAD at gait velocity of at least 2.62Ft/sec    Time  6    Period  Weeks    Status  On-going            Plan - 09/27/17 1040    Clinical Impression Statement  Therapy focused on manual therapy to reduce her headache. She did have improved tolerance to soft tissue mobilization today. She continues to report no benefit from therapy. She will be sent back to her doctor at this time.  She will continue per MD reccomendation. She was strongly encouraged to continue with her exercises. She has an HEP but it is limited to just  light basic strengthening. She will have to do it consitently for benefit.     Clinical Presentation  Evolving    Clinical Decision Making  Moderate    Rehab Potential  Good    PT Frequency  2x / week    PT Duration  6 weeks    PT Treatment/Interventions  ADLs/Self Care Home Management;Cryotherapy;Electrical Stimulation;Iontophoresis 4mg/ml Dexamethasone;Moist Heat;Traction;Ultrasound;Therapeutic exercise;Therapeutic activities;Functional mobility training;Stair training;Gait training;Neuromuscular re-education;Patient/family education;Manual techniques;Passive range   of motion;Dry needling;Taping    PT Next Visit Plan  flexibility and manual for whiplash and low back quadratus lumborum spasm    PT Home Exercise Plan  trunk rotation, SKTC education on quadratus lumbar and AROM of cervcial all planes and moist heat; quad sets, glut sets, abdominal breathing, scpa retraction     Consulted and Agree with Plan of Care  Patient       Patient will benefit from skilled therapeutic intervention in order to improve the following deficits and impairments:  Postural dysfunction, Improper body mechanics, Pain, Impaired UE functional use, Impaired flexibility, Increased muscle spasms, Difficulty walking, Decreased strength, Decreased mobility, Decreased range of motion  Visit Diagnosis: Acute bilateral low back pain with sciatica, sciatica laterality unspecified  Muscle weakness (generalized)  Cramp and spasm  Cervicalgia  Difficulty in walking, not elsewhere classified     Problem List Patient Active Problem List   Diagnosis Date Noted  . Iron deficiency anemia due to chronic blood loss 04/05/2016  . 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 Pain, female pelvic 12/01/2010    Carney Living PT DPT  09/27/2017, 10:47 AM  [facility] [address] [place], [place], [address] Phone: [phone]   Fax:  [phone]  Name: Christine Vega MRN: [id number] Date of Birth: 1970-10-29   PHYSICAL THERAPY DISCHARGE SUMMARY  Visits from Start of Care: 4  Current functional level related to goals / functional outcomes: unnknown   Remaining deficits: unknown   Education / Equipment: Initial HEP Plan: Patient agrees to discharge.  Patient goals were not met. Patient is being discharged due to not returning since the last visit.  ?????    Carolyne Littles PT DPT  01/20/2018   Voncille Lo, PT Certified Exercise Expert for the Aging Adult  12/12/17 11:50 AM Phone: [phone] Fax: [phone]

## 2017-10-03 ENCOUNTER — Telehealth: Payer: Self-pay

## 2017-10-03 NOTE — Telephone Encounter (Addendum)
Botox auth# and valid dates were not noted from March 2019  [place], spoke [place]. Botox PA# [id number] valid 07/01/17 - 12/28/17 Call ref# I (as in [place]) [id number]  [place] [phone], spoke with Curt Bears, confirmed delivery for 10/09/17

## 2017-10-04 ENCOUNTER — Other Ambulatory Visit: Payer: Self-pay

## 2017-10-04 MED ORDER — FLUCONAZOLE 150 MG PO TABS: 150.0000 mg | ORAL_TABLET | Freq: Once | ORAL | 0 refills | Status: AC

## 2017-10-04 NOTE — Telephone Encounter (Signed)
Patient is requesting a diflucion be called into [facility] pharmacy to help clear up yeast infection.

## 2017-10-04 NOTE — Telephone Encounter (Signed)
Pt called questioning when she should order Botox to be delivered for next Thurs (10/10/17) Botox appt. I advised her I have set up delivery with [facility] for Wedn. 10/09/17 I advised Pt to call [facility], as they did mention a $3 copay. I offered her the telephone number, Pt indicated she already had the number.

## 2017-10-09 NOTE — Telephone Encounter (Signed)
Botox rcvd

## 2017-10-10 ENCOUNTER — Ambulatory Visit (INDEPENDENT_AMBULATORY_CARE_PROVIDER_SITE_OTHER): Payer: Medicaid Other | Admitting: Neurology

## 2017-10-10 DIAGNOSIS — G43709 Chronic migraine without aura, not intractable, without status migrainosus: Secondary | ICD-10-CM

## 2017-10-10 MED ORDER — ONABOTULINUMTOXINA 100 UNITS IJ SOLR
200.0000 [IU] | Freq: Once | INTRAMUSCULAR | Status: AC
Start: 2017-10-10 — End: 2017-10-10
  Administered 2017-10-10: 200 [IU] via INTRAMUSCULAR

## 2017-10-10 NOTE — Progress Notes (Signed)
[facility]   Procedure Note Botox  Attending: Dr. Tomi Likens  Preoperative Diagnosis(es): Chronic migraine  Consent obtained from: The patient Benefits discussed included, but were not limited to decreased muscle tightness, increased joint range of motion, and decreased pain.  Risk discussed included, but were not limited pain and discomfort, bleeding, bruising, excessive weakness, venous thrombosis, muscle atrophy and dysphagia.  Anticipated outcomes of the procedure as well as he risks and benefits of the alternatives to the procedure, and the roles and tasks of the personnel to be involved, were discussed with the patient, and the patient consents to the procedure and agrees to proceed. A copy of the patient medication guide was given to the patient which explains the blackbox warning.  Patients identity and treatment sites confirmed Yes.  .  Details of Procedure: Skin was cleaned with alcohol. Prior to injection, the needle plunger was aspirated to make sure the needle was not within a blood vessel.  There was no blood retrieved on aspiration.    Following is a summary of the muscles injected  And the amount of Botulinum toxin used:  Dilution 200 units of Botox was reconstituted with 4 ml of preservative free normal saline. Time of reconstitution: At the time of the office visit (<30 minutes prior to injection)   Injections  155 total units of Botox was injected with a 30 gauge needle.  Injection Sites: L occipitalis: 15 units- 3 sites  R occiptalis: 15 units- 3 sites  L upper trapezius: 15 units- 3 sites R upper trapezius: 15 units- 3 sits          L paraspinal: 10 units- 2 sites R paraspinal: 10 units- 2 sites  Face L frontalis(2 injection sites):10 units   R frontalis(2 injection sites):10 units         L corrugator: 5 units   R corrugator: 5 units           Procerus: 5 units   L temporalis: 20 units R temporalis: 20 units   Agent:  200 units of botulinum Type A  (Onobotulinum Toxin type A) was reconstituted with 4 ml of preservative free normal saline.  Time of reconstitution: At the time of the office visit (<30 minutes prior to injection)     Total injected (Units): 155  Total wasted (Units): none wasted  Patient tolerated procedure well without complications.   Reinjection is anticipated in 3 months.

## 2017-11-01 ENCOUNTER — Ambulatory Visit: Payer: Medicaid Other | Admitting: Neurology

## 2017-11-14 ENCOUNTER — Ambulatory Visit: Payer: Medicaid Other | Admitting: Neurology

## 2017-11-18 ENCOUNTER — Encounter: Payer: Self-pay | Admitting: Neurology

## 2017-11-18 ENCOUNTER — Ambulatory Visit (INDEPENDENT_AMBULATORY_CARE_PROVIDER_SITE_OTHER): Payer: Medicaid Other | Admitting: Neurology

## 2017-11-18 VITALS — BP 128/78 | HR 86 | Ht 71.5 in | Wt 193.0 lb

## 2017-11-18 DIAGNOSIS — G43009 Migraine without aura, not intractable, without status migrainosus: Secondary | ICD-10-CM

## 2017-11-18 MED ORDER — NAPROXEN 500 MG PO TABS: 500.0000 mg | ORAL_TABLET | Freq: Two times a day (BID) | ORAL | 2 refills | Status: DC | PRN

## 2017-11-18 NOTE — Progress Notes (Signed)
NEUROLOGY FOLLOW UP OFFICE NOTE  Christine Vega [id number]  HISTORY OF PRESENT ILLNESS: Christine Vega is a 47 year old woman with migraines, depression and chronic pain who follows up for chronic migraine.   UPDATE: Headache frequency has improved since starting Botox.  She would like to continue it.  She is with headache today, which started yesterday. Intensity:  severe Duration:  4-5 hours Frequency:  11 days a month Frequency of abortive medication: No more than 2 days a week Current NSAIDS:  no Current analgesics:  Hydrocodone for back pain Current triptans/ergot:  Relpax 40mg  Current anti-emetic:  promethazine 50mg  PR Current muscle relaxants:  tizanidine 4mg  Current anti-anxiolytic:  no Current sleep aide:  no Current Antihypertensive medications:  Propranolol ER 120mg  Current Antidepressant medications:  bupropion, sertraline 150mg  Current Anticonvulsant medications:  divalproex 500mg  24r twice daily, gabapentin 300mg  three times daily Current Vitamins/Herbal/Supplements:  D, E Current Antihistamines/Decongestants:  hydroxyzine Other therapy:  Botox (status post 2 rounds)   Caffeine:  Rarely coffee, tea, soda Alcohol:  no Smoker:  no Diet:  Hydrates Exercise:  yes Depression/anxiety:  stable Sleep hygiene:  poor     HISTORY: Onset:  47 years old Location:  Either temple Quality:  Throbbing, pressure Initial Intensity:  10/10 Aura:  no Prodrome:  no Associated symptoms:  Nausea, vomiting, photophobia, phonophobia, osmophobia, blurred vision Initial Duration:  3 days  Initial Frequency:  16 headache days a month Triggers/exacerbating factors:  Certain smells, chocolate , garlic Relieving factors:  no Activity:  Cannot function   Past NSAIDS:  Ibuprofen, naproxen Past analgesics:  Tylenol, oxycodone, Excedrin, Fioricet Past abortive triptans/ergot:  Sumatriptan (tablet and injection), Maxalt, Zomig NS, Migranal NS, Relpax Past muscle relaxants:   Flexeril Past anti-emetic:  promethazine Past anti-anxiolytic:  lorazepam Past antihypertensive medications:  verapamil Past antidepressant medications:  nortriptyline Past anticonvulsant medications:  Topiramate, Lyrica Past vitamins/Herbal/Supplements:  No Other therapy:  no   Family history of headache:  Mother, daughters   CT of head and cervical spine from 12/20/15 were personally reviewed and were negative for acute changes.  Cervical spine did demonstrate degenerative disc disease at C5-6 and C6-7.  PAST MEDICAL HISTORY: Past Medical History:  Diagnosis Date  . Anemia    history   . Anxiety   . Back pain    rotates to left hip and tailbone  . Depression   . DUB (dysfunctional uterine bleeding) 12/01/2010  . Endometriosis   . Fractured coccyx ([place])   . Heart murmur    no problems  . History of seasonal allergies   . Hyperlipidemia   . Migraine   . Migraines    tx w/depakote/verapamil per pt  . Murmur, heart   . 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 Post traumatic stress disorder (PTSD)   . Seizures ([place]) 2004   x 1; unknown source  . Uterine leiomyoma     MEDICATIONS: Current Outpatient Medications on File Prior to Visit  Medication Sig Dispense Refill  . Botulinum Toxin Type A 200 units SOLR Inject 155 Units as directed as directed. 1 each 2  . buPROPion (WELLBUTRIN XL) 150 MG 24 hr tablet Take 150 mg by mouth daily.    . butalbital-acetaminophen-caffeine (FIORICET, ESGIC) 50-325-40 MG tablet Take 1 tablet by mouth every 8 (eight) hours as needed for migraine.    . cetirizine (ZYRTEC) 10 MG tablet Take 10 mg by mouth daily as needed for allergies.    Marland Kitchen  cyclobenzaprine (FLEXERIL) 5 MG tablet Take 1 tablet (5 mg total) by mouth 3 (three) times daily as needed for muscle spasms. 30 tablet 0  . divalproex (DEPAKOTE) 500 MG DR tablet TAKE 2 TABLETS BY MOUTH TWICE DAILY 120 tablet 2  . eletriptan (RELPAX) 40 MG tablet Take 1  tablet (40 mg total) as needed by mouth for migraine or headache. May repeat in 2 hours if headache persists or recurs. No more than 2 in 24 hours 10 tablet 3  . eletriptan (RELPAX) 40 MG tablet Take 1 tablet (40 mg total) by mouth as needed for migraine or headache. May repeat in 2 hours if headache persists or recurs. No more than 2 tablets in 24 hrs. 10 tablet 3  . fluconazole (DIFLUCAN) 150 MG tablet One tab po x 1 and repeat in 3 days 2 tablet 1  . gabapentin (NEURONTIN) 300 MG capsule Take 300 mg by mouth 3 (three) times daily.    Marland Kitchen HYDROcodone-acetaminophen (NORCO/VICODIN) 5-325 MG tablet TK 1 T PO Q 6 H  0  . hydrOXYzine (VISTARIL) 25 MG capsule Take 25 mg by mouth 3 (three) times daily as needed for anxiety.    Marland Kitchen lubiprostone (AMITIZA) 24 MCG capsule Take 24 mcg by mouth 2 (two) times daily with a meal.    . meloxicam (MOBIC) 15 MG tablet Take 1 tablet (15 mg total) by mouth daily. 30 tablet 1  . prochlorperazine (COMPAZINE) 10 MG tablet Take 10 mg by mouth every 6 (six) hours as needed for nausea or vomiting.    . promethazine (PROMETHEGAN) 50 MG suppository Place 50 mg rectally every 8 (eight) hours as needed for nausea or vomiting.     . propranolol ER (INDERAL LA) 120 MG 24 hr capsule Take 120 mg by mouth daily.    . sertraline (ZOLOFT) 100 MG tablet Take 100 mg by mouth daily.    . simvastatin (ZOCOR) 20 MG tablet TK 1 T PO D  1  . tiZANidine (ZANAFLEX) 4 MG tablet Take 4 mg by mouth every 12 (twelve) hours as needed for muscle spasms.      No current facility-administered medications on file prior to visit.     ALLERGIES: Allergies  Allergen Reactions  . Penicillins Hives, Itching and Other (See Comments)    Has patient had a PCN reaction causing immediate rash, facial/tongue/throat swelling, SOB or lightheadedness with hypotension: No Has patient had a PCN reaction causing severe rash involving mucus membranes or skin necrosis: No Has patient had a PCN reaction that required  hospitalization: No Has patient had a PCN reaction occurring within the last 10 years: Yes If all of the above answers are "NO", then may proceed with Cephalosporin use.   . Prednisone Hives and Itching  . Zithromax [Azithromycin] Hives and Itching    FAMILY HISTORY: Family History  Problem Relation Age of Onset  . Migraines Mother   . Heart disease Father   . Migraines Father   . Migraines Daughter     SOCIAL HISTORY: Social History   Socioeconomic History  . Marital status: Legally Separated    Spouse name: Not on file  . Number of children: Not on file  . Years of education: Not on file  . Highest education level: Not on file  Occupational History  . Not on file  Social Needs  . Financial resource strain: Not on file  . Food insecurity:    Worry: Not on file    Inability: Not on file  .  Transportation needs:    Medical: Not on file    Non-medical: Not on file  Tobacco Use  . Smoking status: Never Smoker  . Smokeless tobacco: Never Used  Substance and Sexual Activity  . Alcohol use: No    Alcohol/week: 0.0 oz  . Drug use: No  . Sexual activity: Not Currently    Birth control/protection: Surgical  Lifestyle  . Physical activity:    Days per week: Not on file    Minutes per session: Not on file  . Stress: Not on file  Relationships  . Social connections:    Talks on phone: Not on file    Gets together: Not on file    Attends religious service: Not on file    Active member of club or organization: Not on file    Attends meetings of clubs or organizations: Not on file    Relationship status: Not on file  . Intimate partner violence:    Fear of current or ex partner: Not on file    Emotionally abused: Not on file    Physically abused: Not on file    Forced sexual activity: Not on file  Other Topics Concern  . Not on file  Social History Narrative  . Not on file    REVIEW OF SYSTEMS: Constitutional: No fevers, chills, or sweats, no generalized  fatigue, change in appetite Eyes: No visual changes, double vision, eye pain Ear, nose and throat: No hearing loss, ear pain, nasal congestion, sore throat Cardiovascular: No chest pain, palpitations Respiratory:  No shortness of breath at rest or with exertion, wheezes GastrointestinaI: No nausea, vomiting, diarrhea, abdominal pain, fecal incontinence Genitourinary:  No dysuria, urinary retention or frequency Musculoskeletal:  No neck pain, back pain Integumentary: No rash, pruritus, skin lesions Neurological: as above Psychiatric: No depression, insomnia, anxiety Endocrine: No palpitations, fatigue, diaphoresis, mood swings, change in appetite, change in weight, increased thirst Hematologic/Lymphatic:  No purpura, petechiae. Allergic/Immunologic: no itchy/runny eyes, nasal congestion, recent allergic reactions, rashes  PHYSICAL EXAM: Vitals:   11/18/17 0804  BP: 128/78  Pulse: 86  SpO2: 98%   General: No acute distress.  Patient appears well-groomed.  Head:  Normocephalic/atraumatic Eyes:  Fundi examined but not visualized Neck: supple, no paraspinal tenderness, full range of motion Heart:  Regular rate and rhythm Lungs:  Clear to auscultation bilaterally Back: No paraspinal tenderness Neurological Exam: alert and oriented to person, place, and time. Attention span and concentration intact, recent and remote memory intact, fund of knowledge intact.  Speech fluent and not dysarthric, language intact.  CN II-XII intact. Bulk and tone normal, muscle strength 5/5 throughout.  Sensation to light touch  intact.  Deep tendon reflexes 2+ throughout.  Finger to nose testing intact.  Gait normal, Romberg negative.  IMPRESSION: Migraine without aura, without status migrainosus, not intractable  PLAN: 1.  Continue Botox 2.  Take Maxalt with naproxen 500mg  at earliest onset of headache.  May repeat dose (Maxalt with naproxen) once in 2 hours if needed.  Do not exceed two doses in 24  hours. 3.  Limit use of pain relievers to no more than 2 days out of the week.  These medications include acetaminophen, ibuprofen, triptans and narcotics.  This will help reduce risk of rebound headaches. 4.  Be aware of common food triggers such as processed sweets, processed foods with nitrites (such as deli meat, hot dogs, sausages), foods with MSG, alcohol (such as wine), chocolate, certain cheeses, certain fruits (dried fruits, bananas, some citrus  fruit), vinegar, diet soda. 4.  Avoid caffeine 5.  Routine exercise 6.  Proper sleep hygiene 7.  Stay adequately hydrated with water 8.  Keep a headache diary. 9.  Maintain proper stress management. 10.  Do not skip meals. 11.  Consider supplements:  Magnesium citrate 400mg  to 600mg  daily, riboflavin 400mg , Coenzyme Q 10 100mg  three times daily 12.  Follow up for next Botox.  Follow up for next office visit in 6 months.  Metta Clines, DO  CC: Dr. Jonelle Sidle

## 2017-11-18 NOTE — Patient Instructions (Signed)
Migraine Recommendations: 1.  Continue Botox 2.  Take Maxalt with naproxen 500mg  at earliest onset of headache.  May repeat dose (Maxalt with naproxen) once in 2 hours if needed.  Do not exceed two doses in 24 hours. 3.  Limit use of pain relievers to no more than 2 days out of the week.  These medications include acetaminophen, ibuprofen, triptans and narcotics.  This will help reduce risk of rebound headaches. 4.  Be aware of common food triggers such as processed sweets, processed foods with nitrites (such as deli meat, hot dogs, sausages), foods with MSG, alcohol (such as wine), chocolate, certain cheeses, certain fruits (dried fruits, bananas, some citrus fruit), vinegar, diet soda. 4.  Avoid caffeine 5.  Routine exercise 6.  Proper sleep hygiene 7.  Stay adequately hydrated with water 8.  Keep a headache diary. 9.  Maintain proper stress management. 10.  Do not skip meals. 11.  Consider supplements:  Magnesium citrate 400mg  to 600mg  daily, riboflavin 400mg , Coenzyme Q 10 100mg  three times daily

## 2017-11-21 ENCOUNTER — Other Ambulatory Visit: Payer: Self-pay | Admitting: Obstetrics & Gynecology

## 2017-11-26 ENCOUNTER — Ambulatory Visit: Payer: Medicaid Other | Admitting: Obstetrics and Gynecology

## 2017-12-02 NOTE — Progress Notes (Signed)
Last pap 03/17/2015 -HPV detected

## 2017-12-04 ENCOUNTER — Other Ambulatory Visit (HOSPITAL_COMMUNITY)
Admission: RE | Admit: 2017-12-04 | Discharge: 2017-12-04 | Disposition: A | Discharge status: Home / Self Care | Payer: Medicaid Other | Source: Ambulatory Visit | Attending: Student | Admitting: Student

## 2017-12-04 ENCOUNTER — Encounter: Payer: Self-pay | Admitting: Student

## 2017-12-04 ENCOUNTER — Ambulatory Visit (INDEPENDENT_AMBULATORY_CARE_PROVIDER_SITE_OTHER): Payer: Medicaid Other | Admitting: Student

## 2017-12-04 VITALS — BP 121/80 | HR 92 | Resp 18 | Ht 71.5 in | Wt 192.8 lb

## 2017-12-04 DIAGNOSIS — R87810 Cervical high risk human papillomavirus (HPV) DNA test positive: Secondary | ICD-10-CM | POA: Insufficient documentation
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
## 2017-12-04 NOTE — Patient Instructions (Signed)

## 2017-12-05 ENCOUNTER — Other Ambulatory Visit: Payer: Self-pay | Admitting: Obstetrics & Gynecology

## 2017-12-05 ENCOUNTER — Other Ambulatory Visit: Payer: Self-pay | Admitting: Internal Medicine

## 2017-12-05 DIAGNOSIS — Z1231 Encounter for screening mammogram for malignant neoplasm of breast: Secondary | ICD-10-CM

## 2017-12-05 LAB — CYTOLOGY - PAP
Diagnosis: NEGATIVE
HPV: NOT DETECTED

## 2017-12-13 ENCOUNTER — Ambulatory Visit
Admission: RE | Admit: 2017-12-13 | Discharge: 2017-12-13 | Disposition: A | Discharge status: Home / Self Care | Payer: Medicaid Other | Source: Ambulatory Visit | Attending: Obstetrics & Gynecology | Admitting: Obstetrics & Gynecology

## 2017-12-13 DIAGNOSIS — Z1231 Encounter for screening mammogram for malignant neoplasm of breast: Secondary | ICD-10-CM

## 2017-12-17 ENCOUNTER — Encounter

## 2017-12-17 ENCOUNTER — Ambulatory Visit: Payer: Medicaid Other | Admitting: Neurology

## 2017-12-24 ENCOUNTER — Other Ambulatory Visit: Payer: Self-pay | Admitting: Neurology

## 2017-12-31 ENCOUNTER — Telehealth: Payer: Self-pay | Admitting: Neurology

## 2017-12-31 NOTE — Telephone Encounter (Signed)
Patient needs to speak with the nurse. She would not give a reason why. Please call her back at [phone]. Thanks!

## 2018-01-01 NOTE — Telephone Encounter (Signed)
Spoke with Pt earlier today, she had rcvd call from [facility] about ordering Botox.  Pt was not on the schedule for next Botox. I made appointment for 01/10/18. I advised Pt I will submit PA with [facility]. Pt is to let me know if she is able to order Botox or if I should order for her.

## 2018-01-06 ENCOUNTER — Telehealth: Payer: Self-pay | Admitting: Neurology

## 2018-01-06 NOTE — Telephone Encounter (Addendum)
Called and spoke with Pt, she wasn't able to get thru to [facility], she asked I call them for delivery  Called [facility], spoke with Joelene Millin, Botox apprvd thru 05/01/18 PA# [phone]  Called [facility], spoke with Danae Chen. Botox will be delivered Wednesday 01/08/18

## 2018-01-06 NOTE — Telephone Encounter (Signed)
Patient called and lmom regarding needing to order Botox. Please Call. Thanks

## 2018-01-10 ENCOUNTER — Telehealth: Payer: Self-pay | Admitting: Neurology

## 2018-01-10 ENCOUNTER — Ambulatory Visit: Payer: Medicaid Other | Admitting: Neurology

## 2018-01-10 NOTE — Telephone Encounter (Signed)
Patient is calling in stating that she needs a call back from the nurse. She would not give the reason why. Please call her at [phone]. Thanks!

## 2018-01-10 NOTE — Telephone Encounter (Signed)
Per Dr Tomi Likens, put her on wait list for next week

## 2018-01-10 NOTE — Telephone Encounter (Signed)
Patient states that she is running a fever 101 and will not be able to come in today with the Botox. She would really like to get this resch as soon as possible. She does not want to have to start over with the Botox.

## 2018-01-14 ENCOUNTER — Ambulatory Visit (INDEPENDENT_AMBULATORY_CARE_PROVIDER_SITE_OTHER): Payer: Medicaid Other | Admitting: Neurology

## 2018-01-14 DIAGNOSIS — G43709 Chronic migraine without aura, not intractable, without status migrainosus: Secondary | ICD-10-CM

## 2018-01-14 MED ORDER — ONABOTULINUMTOXINA 100 UNITS IJ SOLR
200.0000 [IU] | Freq: Once | INTRAMUSCULAR | Status: AC
  Administered 2018-01-14: 200 [IU] via INTRAMUSCULAR

## 2018-01-14 NOTE — Progress Notes (Signed)
[facility]   Procedure Note Botox  Attending: Dr. Abbigail Anstey  Preoperative Diagnosis(es): Chronic migraine  Consent obtained from: The patient. Benefits discussed included, but were not limited to decreased muscle tightness, increased joint range of motion, and decreased pain.  Risk discussed included, but were not limited pain and discomfort, bleeding, bruising, excessive weakness, venous thrombosis, muscle atrophy and dysphagia.  Anticipated outcomes of the procedure as well as he risks and benefits of the alternatives to the procedure, and the roles and tasks of the personnel to be involved, were discussed with the patient, and the patient consents to the procedure and agrees to proceed. A copy of the patient medication guide was given to the patient which explains the blackbox warning.  Patients identity and treatment sites confirmed:  Yes.  Details of Procedure: Skin was cleaned with alcohol. Prior to injection, the needle plunger was aspirated to make sure the needle was not within a blood vessel.  There was no blood retrieved on aspiration.    Following is a summary of the muscles injected  And the amount of Botulinum toxin used:  Dilution 200 units of Botox was reconstituted with 4 ml of preservative free normal saline. Time of reconstitution: At the time of the office visit (<30 minutes prior to injection)   Injections  155 total units of Botox was injected with a 30 gauge needle.  Injection Sites: L occipitalis: 15 units- 3 sites  R occiptalis: 15 units- 3 sites  L upper trapezius: 15 units- 3 sites R upper trapezius: 15 units- 3 sits          L paraspinal: 10 units- 2 sites R paraspinal: 10 units- 2 sites  Face L frontalis(2 injection sites):10 units   R frontalis(2 injection sites):10 units         L corrugator: 5 units   R corrugator: 5 units           Procerus: 5 units   L temporalis: 20 units R temporalis: 20 units   Agent:  200 units of botulinum Type A  (Onobotulinum Toxin type A) was reconstituted with 4 ml of preservative free normal saline.  Time of reconstitution: At the time of the office visit (<30 minutes prior to injection)     Total injected (Units): 155  Total wasted (Units): 45  Patient tolerated procedure well without complications.   Reinjection is anticipated in 3 months. Return to clinic in 4.5 months.   

## 2018-01-22 ENCOUNTER — Other Ambulatory Visit: Payer: Self-pay | Admitting: Neurology

## 2018-01-22 MED ORDER — ELETRIPTAN HYDROBROMIDE 40 MG PO TABS: 40.0000 mg | ORAL_TABLET | ORAL | 3 refills | Status: DC | PRN

## 2018-01-28 ENCOUNTER — Ambulatory Visit: Payer: Medicaid Other | Admitting: Oncology

## 2018-01-28 ENCOUNTER — Other Ambulatory Visit: Payer: Medicaid Other

## 2018-01-29 ENCOUNTER — Ambulatory Visit (INDEPENDENT_AMBULATORY_CARE_PROVIDER_SITE_OTHER): Payer: Medicaid Other | Admitting: Advanced Practice Midwife

## 2018-01-29 ENCOUNTER — Encounter: Payer: Self-pay | Admitting: Advanced Practice Midwife

## 2018-01-29 ENCOUNTER — Telehealth: Payer: Self-pay | Admitting: Oncology

## 2018-01-29 ENCOUNTER — Other Ambulatory Visit (HOSPITAL_COMMUNITY)
Admission: RE | Admit: 2018-01-29 | Discharge: 2018-01-29 | Disposition: A | Discharge status: Home / Self Care | Payer: Medicaid Other | Source: Ambulatory Visit | Attending: Advanced Practice Midwife | Admitting: Advanced Practice Midwife

## 2018-01-29 ENCOUNTER — Telehealth: Payer: Self-pay

## 2018-01-29 VITALS — BP 113/71 | HR 71 | Wt 195.2 lb

## 2018-01-29 DIAGNOSIS — Z113 Encounter for screening for infections with a predominantly sexual mode of transmission: Secondary | ICD-10-CM | POA: Diagnosis not present

## 2018-01-29 DIAGNOSIS — N898 Other specified noninflammatory disorders of vagina: Secondary | ICD-10-CM | POA: Insufficient documentation

## 2018-01-29 MED ORDER — FLUCONAZOLE 150 MG PO TABS: 150.0000 mg | ORAL_TABLET | Freq: Once | ORAL | 0 refills | Status: AC

## 2018-01-29 NOTE — Progress Notes (Signed)
AB11Std testing today Vaginal irritation x 2-3 days

## 2018-01-29 NOTE — Progress Notes (Addendum)
GYNECOLOGY PROBLEM ENCOUNTER NOTE  Subjective:   RAND BOLLER is a 47 y.o. G3P2002 female here for a routine annual gynecologic exam.  Current complaints: Patient states she had intercourse with a condom last Wednesday 01/22/18. States she usually inspects her partner's pelvis for signs of STIs but did not do that for this episode. Reports new onset abnormal vaginal discharge, not foul-smelling.  Denies abnormal vaginal bleeding, pelvic pain, problems with intercourse or other gynecologic concerns.    Gynecologic History Patient's last menstrual period was 01/13/2016 (exact date). Contraception: condoms and tubal ligation Last Pap: 12/04/2017. Results were: normal with negative HPV Last mammogram: 12/13/2017 Results were: normal  Obstetric History OB History  Gravida Para Term Preterm AB Living  2 2 2  0 0 2  SAB TAB Ectopic Multiple Live Births  0 0 0 0 2    # Outcome Date GA Lbr Len/2nd Weight Sex Delivery Anes PTL Lv  2 Term      Vag-Spont   LIV  1 Term      Vag-Spont   LIV    Past Medical History:  Diagnosis Date  . Anemia    history   . Anxiety   . Back pain    rotates to left hip and tailbone  . Depression   . DUB (dysfunctional uterine bleeding) 12/01/2010  . Endometriosis   . Fractured coccyx ([place])   . Heart murmur    no problems  . History of seasonal allergies   . Hyperlipidemia   . Migraine   . Migraines    tx w/depakote/verapamil per pt  . Murmur, heart   . 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 Post traumatic stress disorder (PTSD)   . Seizures ([place]) 2004   x 1; unknown source  . Uterine leiomyoma     Past Surgical History:  Procedure Laterality Date  . CHOLECYSTECTOMY    . HERNIA REPAIR    . HYSTERECTOMY ABDOMINAL WITH SALPINGECTOMY Left 02/07/2016   Procedure: HYSTERECTOMY ABDOMINAL WITH  left SALPINGECTOMY;  Surgeon: Mora Bellman, MD;  Location: [facility];  Service: Gynecology;  Laterality: Left;  .  LAPAROSCOPY  05/16/2011   Procedure: LAPAROSCOPY OPERATIVE;  Surgeon: Emeterio Reeve, MD;  Location: [facility];  Service: Gynecology;  Laterality: N/A;  poss oophorectomy  . LAPAROSCOPY  08/06/2011   Procedure: LAPAROSCOPY OPERATIVE;  Surgeon: Woodroe Mode, MD;  Location: [facility];  Service: Gynecology;  Laterality: N/A;  . LAPAROTOMY     for endometriosis/adhesions  . SALPINGOOPHORECTOMY  08/06/2011   Procedure: SALPINGO OOPHERECTOMY;  Surgeon: Woodroe Mode, MD;  Location: [facility];  Service: Gynecology;  Laterality: Right;  . SVD     x 2  . TUBAL LIGATION    . WISDOM TOOTH EXTRACTION      Current Outpatient Medications on File Prior to Visit  Medication Sig Dispense Refill  . buPROPion (WELLBUTRIN XL) 150 MG 24 hr tablet Take 150 mg by mouth daily.    . butalbital-acetaminophen-caffeine (FIORICET, ESGIC) 50-325-40 MG tablet Take 1 tablet by mouth every 8 (eight) hours as needed for migraine.    . cetirizine (ZYRTEC) 10 MG tablet Take 10 mg by mouth daily as needed for allergies.    . cyclobenzaprine (FLEXERIL) 5 MG tablet Take 1 tablet (5 mg total) by mouth 3 (three) times daily as needed for muscle spasms. 30 tablet 0  . eletriptan (RELPAX) 40 MG tablet Take 1 tablet (40 mg total) as  needed by mouth for migraine or headache. May repeat in 2 hours if headache persists or recurs. No more than 2 in 24 hours 10 tablet 3  . gabapentin (NEURONTIN) 300 MG capsule Take 300 mg by mouth 3 (three) times daily.    Marland Kitchen HYDROcodone-acetaminophen (NORCO/VICODIN) 5-325 MG tablet TK 1 T PO Q 6 H  0  . hydrOXYzine (VISTARIL) 25 MG capsule Take 25 mg by mouth 3 (three) times daily as needed for anxiety.    . meloxicam (MOBIC) 15 MG tablet Take 1 tablet (15 mg total) by mouth daily. 30 tablet 1  . naproxen (NAPROSYN) 500 MG tablet Take 1 tablet (500 mg total) by mouth every 12 (twelve) hours as needed. 16 tablet 2  . prochlorperazine (COMPAZINE) 10 MG tablet Take 10 mg by mouth every 6 (six) hours as needed for  nausea or vomiting.    . propranolol ER (INDERAL LA) 120 MG 24 hr capsule Take 120 mg by mouth daily.    . sertraline (ZOLOFT) 100 MG tablet Take 100 mg by mouth daily.    . simvastatin (ZOCOR) 20 MG tablet TK 1 T PO D  1  . tiZANidine (ZANAFLEX) 4 MG tablet Take 4 mg by mouth every 12 (twelve) hours as needed for muscle spasms.     . divalproex (DEPAKOTE) 500 MG DR tablet TAKE 2 TABLETS BY MOUTH TWICE DAILY 120 tablet 2  . eletriptan (RELPAX) 40 MG tablet Take 1 tablet (40 mg total) by mouth as needed for migraine or headache. May repeat in 2 hrs if headache persists. No more than 2 tablets/24 hrs. (Patient not taking: Reported on 01/29/2018) 10 tablet 3  . promethazine (PROMETHEGAN) 50 MG suppository Place 50 mg rectally every 8 (eight) hours as needed for nausea or vomiting.      No current facility-administered medications on file prior to visit.     Allergies  Allergen Reactions  . Penicillins Hives, Itching and Other (See Comments)    Has patient had a PCN reaction causing immediate rash, facial/tongue/throat swelling, SOB or lightheadedness with hypotension: No Has patient had a PCN reaction causing severe rash involving mucus membranes or skin necrosis: No Has patient had a PCN reaction that required hospitalization: No Has patient had a PCN reaction occurring within the last 10 years: Yes If all of the above answers are "NO", then may proceed with Cephalosporin use.   . Prednisone Hives and Itching  . Zithromax [Azithromycin] Hives and Itching    Social History:  reports that she has never smoked. She has never used smokeless tobacco. She reports that she does not drink alcohol or use drugs.  Family History  Problem Relation Age of Onset  . Migraines Mother   . Heart disease Father   . Migraines Father   . Migraines Daughter   . Breast cancer Neg Hx     The following portions of the patient's history were reviewed and updated as appropriate: allergies, current  medications, past family history, past medical history, past social history, past surgical history and problem list.  Review of Systems Pertinent items noted in HPI and remainder of comprehensive ROS otherwise negative.   Objective:  BP 113/71   Pulse 71   Wt 195 lb 3.2 oz (88.5 kg)   LMP 01/13/2016 (Exact Date)   BMI 26.85 kg/m  CONSTITUTIONAL: Well-developed, well-nourished female in no acute distress.  HENT:  Normocephalic, atraumatic, External right and left ear normal. Oropharynx is clear and moist EYES: Conjunctivae and EOM  are normal. Pupils are equal, round, and reactive to light. No scleral icterus.  NECK: Normal range of motion, supple, no masses.  Normal thyroid.  SKIN: Skin is warm and dry. No rash noted. Not diaphoretic. No erythema. No pallor. MUSCULOSKELETAL: Normal range of motion. No tenderness.  No cyanosis, clubbing, or edema.  2+ distal pulses. NEUROLOGIC: Alert and oriented to person, place, and time. Normal reflexes, muscle tone coordination. No cranial nerve deficit noted. PSYCHIATRIC: Normal mood and affect. Normal behavior. Normal judgment and thought content. CARDIOVASCULAR: Normal heart rate noted, regular rhythm RESPIRATORY: Clear to auscultation bilaterally. Effort and breath sounds normal, no problems with respiration noted. BREASTS: Symmetric in size. No masses, skin changes, nipple drainage, or lymphadenopathy. ABDOMEN: Soft, normal bowel sounds, no distention noted.  No tenderness, rebound or guarding.  PELVIC: Normal appearing external genitalia; normal appearing vaginal mucosa and cervix.  No abnormal discharge noted.  Pap smear obtained.  Normal uterine size, no other palpable masses, no uterine or adnexal tenderness.    Assessment and Plan:  1. Vaginal Discharge - Cervicovaginal ancillary only - Discussed free STI testing available ar [facility] and [facility] - Rx Diflucan per patient request  2. Routine screening for STI (sexually transmitted infection) -  Patient had HIV drawn in September 2019. Requesting additional labs today - RPR - Hepatitis B Core Antibody, IgM - Hepatitis C Antibody  Will follow up on results and manage accordingly. Routine preventative health maintenance measures emphasized. Please refer to After Visit Summary for other counseling recommendations.   Mallie Snooks, CNM 01/29/18 12:01 PM

## 2018-01-29 NOTE — Telephone Encounter (Signed)
Appts scheduled patient notified per 10/16 sch msg °

## 2018-01-29 NOTE — Telephone Encounter (Signed)
Contacted patient to make aware of missed appointment on 10/15. Scheduling message sent per patient request to reschedule appointments.

## 2018-01-30 ENCOUNTER — Ambulatory Visit (INDEPENDENT_AMBULATORY_CARE_PROVIDER_SITE_OTHER): Payer: Medicaid Other | Admitting: Obstetrics & Gynecology

## 2018-01-30 ENCOUNTER — Encounter: Payer: Self-pay | Admitting: Obstetrics & Gynecology

## 2018-01-30 VITALS — BP 138/77 | HR 72

## 2018-01-30 DIAGNOSIS — R21 Rash and other nonspecific skin eruption: Secondary | ICD-10-CM | POA: Diagnosis not present

## 2018-01-30 LAB — HEPATITIS B CORE ANTIBODY, IGM: Hep B C IgM: NEGATIVE

## 2018-01-30 LAB — CERVICOVAGINAL ANCILLARY ONLY
Bacterial vaginitis: NEGATIVE
Candida vaginitis: NEGATIVE
Chlamydia: NEGATIVE
Neisseria Gonorrhea: NEGATIVE
Trichomonas: NEGATIVE

## 2018-01-30 LAB — RPR: RPR Ser Ql: NONREACTIVE

## 2018-01-30 LAB — HEPATITIS C ANTIBODY: Hep C Virus Ab: <0.1 s/co ratio (ref 0.0–0.9)

## 2018-01-30 MED ORDER — TRIAMCINOLONE ACETONIDE 0.025 % EX OINT: 1.0000 "application " | TOPICAL_OINTMENT | Freq: Two times a day (BID) | CUTANEOUS | 2 refills | Status: DC

## 2018-01-31 NOTE — Patient Instructions (Signed)

## 2018-01-31 NOTE — Progress Notes (Signed)
GYNECOLOGY OFFICE VISIT NOTE  History:  47 y.o. [id number] here today for discussion about management of perineal rash. Was seen by another provider yesterday for vulvovaginitis, but patient also wanted to be evaluated for a new rash that she felt was not addressed yesterday. Rash noted for a few days, mildly pruritic but she is worried about an infection.  She denies any abnormal vaginal bleeding, pelvic pain or other concerns.   Past Medical History:  Diagnosis Date  . Anemia    history   . Anxiety   . Back pain    rotates to left hip and tailbone  . Depression   . DUB (dysfunctional uterine bleeding) 12/01/2010  . Endometriosis   . Fractured coccyx ([place])   . Heart murmur    no problems  . History of seasonal allergies   . Hyperlipidemia   . Migraine   . Migraines    tx w/depakote/verapamil per pt  . Murmur, heart   . 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 Post traumatic stress disorder (PTSD)   . Seizures ([place]) 2004   x 1; unknown source  . Uterine leiomyoma     Past Surgical History:  Procedure Laterality Date  . CHOLECYSTECTOMY    . HERNIA REPAIR    . HYSTERECTOMY ABDOMINAL WITH SALPINGECTOMY Left 02/07/2016   Procedure: HYSTERECTOMY ABDOMINAL WITH  left SALPINGECTOMY;  Surgeon: Mora Bellman, MD;  Location: [facility];  Service: Gynecology;  Laterality: Left;  . LAPAROSCOPY  05/16/2011   Procedure: LAPAROSCOPY OPERATIVE;  Surgeon: Emeterio Reeve, MD;  Location: [facility];  Service: Gynecology;  Laterality: N/A;  poss oophorectomy  . LAPAROSCOPY  08/06/2011   Procedure: LAPAROSCOPY OPERATIVE;  Surgeon: Woodroe Mode, MD;  Location: [facility];  Service: Gynecology;  Laterality: N/A;  . LAPAROTOMY     for endometriosis/adhesions  . SALPINGOOPHORECTOMY  08/06/2011   Procedure: SALPINGO OOPHERECTOMY;  Surgeon: Woodroe Mode, MD;  Location: [facility];  Service: Gynecology;  Laterality: Right;  . SVD     x 2  . TUBAL LIGATION    . WISDOM TOOTH  EXTRACTION      The following portions of the patient's history were reviewed and updated as appropriate: allergies, current medications, past family history, past medical history, past social history, past surgical history and problem list.   Health Maintenance:  Normal pap and negative HRHPV on 12/04/2017.  Normal mammogram on 12/13/2017.   Review of Systems:  Pertinent items noted in HPI and remainder of comprehensive ROS otherwise negative.  Objective:  Physical Exam BP 138/77   Pulse 72   LMP 01/13/2016 (Exact Date)  CONSTITUTIONAL: Well-developed, well-nourished female in no acute distress.  MUSCULOSKELETAL: Normal range of motion. No edema noted.Marland Kitchen CARDIOVASCULAR: Normal heart rate noted RESPIRATORY: Effort and breath sounds normal, no problems with respiration noted ABDOMEN: Soft, no distention noted.   PELVIC: Normal appearing external genitalia. Papular rash noted in the perianal area, no erythema, no drainage. Also noted in the junctions between vulva and inner thighs bilaterally, but no erythema. Resembles milaria-like rash.  Labs and Imaging Results for orders placed or performed in visit on 01/29/18 (from the past 168 hour(s))  Cervicovaginal ancillary only   Collection Time: 01/29/18 12:00 AM  Result Value Ref Range   Bacterial vaginitis Negative for Bacterial Vaginitis Microorganisms    Candida vaginitis Negative for Candida species    Chlamydia Negative    Neisseria gonorrhea Negative    Trichomonas Negative  RPR   Collection Time: 01/29/18 11:28 AM  Result Value Ref Range   RPR Ser Ql Non Reactive Non Reactive  Hepatitis B Core Antibody, IgM   Collection Time: 01/29/18 11:28 AM  Result Value Ref Range   Hep B C IgM Negative Negative  Hepatitis C Antibody   Collection Time: 01/29/18 11:28 AM  Result Value Ref Range   Hep C Virus Ab <0.1 0.0 - 0.9 s/co ratio   No results found.  Assessment & Plan:  1. Perineal rash in female On further discussion,  patient reported using new washing agent in her perineal region. Rash most likely contact dermatitis. Was advised to go back to hypoallergenic soap she used previously.  Proper vulvar hygiene emphasized: discussed avoidance of perfumed soaps, detergents, lotions and any type of douches; in addition to wearing cotton underwear and no underwear at night.  Also recommended cleaning front to back, voiding and cleaning up after intercourse.   Reviewed results from yesterday, she said she took Diflucan as prescribed.  Topical steroid cream prescribed, she was advised to use for next 7-10 days. - triamcinolone (KENALOG) 0.025 % ointment; Apply 1 application topically 2 (two) times daily.  Dispense: 80 g; Refill: 2  Please refer to After Visit Summary for other counseling recommendations.   Return if symptoms worsen or fail to improve.   Total face-to-face time with patient: 15 minutes.  Over 50% of encounter was spent on counseling and coordination of care.   Verita Schneiders, MD, [facility] [employer], [place]

## 2018-02-01 ENCOUNTER — Encounter (HOSPITAL_COMMUNITY): Payer: Self-pay | Admitting: Emergency Medicine

## 2018-02-01 ENCOUNTER — Other Ambulatory Visit: Payer: Self-pay

## 2018-02-01 ENCOUNTER — Ambulatory Visit (HOSPITAL_COMMUNITY)
Admission: EM | Admit: 2018-02-01 | Discharge: 2018-02-01 | Disposition: A | Discharge status: Home / Self Care | Payer: Medicaid Other | Attending: Family Medicine | Admitting: Family Medicine

## 2018-02-01 DIAGNOSIS — Z79899 Other long term (current) drug therapy: Secondary | ICD-10-CM | POA: Insufficient documentation

## 2018-02-01 DIAGNOSIS — F4310 Post-traumatic stress disorder, unspecified: Secondary | ICD-10-CM | POA: Diagnosis not present

## 2018-02-01 DIAGNOSIS — N898 Other specified noninflammatory disorders of vagina: Secondary | ICD-10-CM | POA: Diagnosis present

## 2018-02-01 DIAGNOSIS — Z8249 Family history of ischemic heart disease and other diseases of the circulatory system: Secondary | ICD-10-CM | POA: Insufficient documentation

## 2018-02-01 DIAGNOSIS — E785 Hyperlipidemia, unspecified: Secondary | ICD-10-CM | POA: Insufficient documentation

## 2018-02-01 DIAGNOSIS — G43909 Migraine, unspecified, not intractable, without status migrainosus: Secondary | ICD-10-CM | POA: Insufficient documentation

## 2018-02-01 DIAGNOSIS — N760 Acute vaginitis: Secondary | ICD-10-CM | POA: Insufficient documentation

## 2018-02-01 DIAGNOSIS — F329 Major depressive disorder, single episode, unspecified: Secondary | ICD-10-CM | POA: Insufficient documentation

## 2018-02-01 NOTE — Discharge Instructions (Signed)
You may take the second pill provided for yeast, however the testing was negative at your Gynecologist visit.  Use of the provided cream to vulva as needed for irritation.  Keep skin clean and dry, avoid use of the new soap.  Will notify you of any positive findings from your vaginal sample and if any changes to treatment are needed.  Please follow up with gynecology as needed for persistent symptoms.

## 2018-02-01 NOTE — ED Triage Notes (Signed)
Patient has used a different soap than usual.  Last Wednesday had intercourse using condom, patient not sure if condom broke

## 2018-02-01 NOTE — ED Provider Notes (Signed)
[facility]    CSN: [id number] Arrival date & time: 02/01/18  1426     History   Chief Complaint Chief Complaint  Patient presents with  . Vaginal Itching    HPI Christine Vega is a 47 y.o. female.   Christine Vega presents with complaints of persistent vaginal discharge and vulvar irritation. States she had intercourse 10 days ago, unsure if the condom broke, then , 6 days ago used a new soap- she usually uses hypoallergenic soap. Develop vaginal discharge and itching as well as external irritation to vulva 4 days ago. States she is concerned because she feels the skin is darker in color to her vulva and perineum than the rest of her skin. Saw her gynecologist three days ago, was given diflucan and vaginal testing was completed. All was negative. Went back two days ago due to external rash and was provided kenalog. She states her symptoms have somewhat improved but not resolved. She has taken 1 diflucan and planned to take the second today. States she is concerned about HPV, she has had in the past. However had a partial hysterectomy "which took care of it." has had tubal ligation. No pelvic pain. No vaginal bleeding. No urinary symptoms.    ROS per HPI.      Past Medical History:  Diagnosis Date  . Anemia    history   . Anxiety   . Back pain    rotates to left hip and tailbone  . Depression   . DUB (dysfunctional uterine bleeding) 12/01/2010  . Endometriosis   . Fractured coccyx ([place])   . Heart murmur    no problems  . History of seasonal allergies   . Hyperlipidemia   . Migraine   . Migraines    tx w/depakote/verapamil per pt  . Murmur, heart   . Neuromuscular disorder ([place])    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, walks/balance ok  . Post traumatic stress disorder (PTSD)   . Seizures ([place]) 2004   x 1; unknown source  . Uterine leiomyoma     Patient Active Problem List   Diagnosis Date Noted  . Iron deficiency anemia due to chronic blood loss 04/05/2016  .  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 Pain, female pelvic 12/01/2010    Past Surgical History:  Procedure Laterality Date  . CHOLECYSTECTOMY    . HERNIA REPAIR    . HYSTERECTOMY ABDOMINAL WITH SALPINGECTOMY Left 02/07/2016   Procedure: HYSTERECTOMY ABDOMINAL WITH  left SALPINGECTOMY;  Surgeon: Mora Bellman, MD;  Location: [facility];  Service: Gynecology;  Laterality: Left;  . LAPAROSCOPY  05/16/2011   Procedure: LAPAROSCOPY OPERATIVE;  Surgeon: Emeterio Reeve, MD;  Location: [facility];  Service: Gynecology;  Laterality: N/A;  poss oophorectomy  . LAPAROSCOPY  08/06/2011   Procedure: LAPAROSCOPY OPERATIVE;  Surgeon: Woodroe Mode, MD;  Location: [facility];  Service: Gynecology;  Laterality: N/A;  . LAPAROTOMY     for endometriosis/adhesions  . SALPINGOOPHORECTOMY  08/06/2011   Procedure: SALPINGO OOPHERECTOMY;  Surgeon: Woodroe Mode, MD;  Location: [facility];  Service: Gynecology;  Laterality: Right;  . SVD     x 2  . TUBAL LIGATION    . WISDOM TOOTH EXTRACTION      OB History    Gravida  2   Para  2   Term  2   Preterm  0   AB  0   Living  2     SAB  0   TAB  0   Ectopic  0   Multiple  0   Live Births  2            Home Medications    Prior to Admission medications   Medication Sig Start Date End Date Taking? Authorizing Provider  buPROPion (WELLBUTRIN XL) 150 MG 24 hr tablet Take 150 mg by mouth daily.   Yes [provider]  cetirizine (ZYRTEC) 10 MG tablet Take 10 mg by mouth daily as needed for allergies.   Yes [provider]  cyclobenzaprine (FLEXERIL) 5 MG tablet Take 1 tablet (5 mg total) by mouth 3 (three) times daily as needed for muscle spasms. 07/31/17  Yes Jaynee Eagles, PA-C  divalproex (DEPAKOTE) 500 MG DR tablet TAKE 2 TABLETS BY MOUTH TWICE DAILY 12/25/17  Yes Tomi Likens, Adam R, DO  sertraline (ZOLOFT) 100 MG tablet Take 100 mg by mouth daily.  07/25/11  Yes [provider]  simvastatin (ZOCOR) 20 MG tablet TK 1 T PO D 05/14/16  Yes [provider]  butalbital-acetaminophen-caffeine (FIORICET, ESGIC) 50-325-40 MG tablet Take 1 tablet by mouth every 8 (eight) hours as needed for migraine.    [provider]  eletriptan (RELPAX) 40 MG tablet Take 1 tablet (40 mg total) as needed by mouth for migraine or headache. May repeat in 2 hours if headache persists or recurs. No more than 2 in 24 hours Patient not taking: Reported on 01/30/2018 02/26/17   Pieter Partridge, DO  eletriptan (RELPAX) 40 MG tablet Take 1 tablet (40 mg total) by mouth as needed for migraine or headache. May repeat in 2 hrs if headache persists. No more than 2 tablets/24 hrs. 01/22/18   Pieter Partridge, DO  gabapentin (NEURONTIN) 300 MG capsule Take 300 mg by mouth 3 (three) times daily.    [provider]  HYDROcodone-acetaminophen (NORCO/VICODIN) 5-325 MG tablet TK 1 T PO Q 6 H 07/24/16   [provider]  hydrOXYzine (VISTARIL) 25 MG capsule Take 25 mg by mouth 3 (three) times daily as needed for anxiety.    [provider]  meloxicam (MOBIC) 15 MG tablet Take 1 tablet (15 mg total) by mouth daily. 07/31/17   Jaynee Eagles, PA-C  naproxen (NAPROSYN) 500 MG tablet Take 1 tablet (500 mg total) by mouth every 12 (twelve) hours as needed. 11/18/17   Pieter Partridge, DO  prochlorperazine (COMPAZINE) 10 MG tablet Take 10 mg by mouth every 6 (six) hours as needed for nausea or vomiting.    [provider]  promethazine (PROMETHEGAN) 50 MG suppository Place 50 mg rectally every 8 (eight) hours as needed for nausea or vomiting.     [provider]  propranolol ER (INDERAL LA) 120 MG 24 hr capsule Take 120 mg by mouth daily.    [provider]  tiZANidine (ZANAFLEX) 4 MG tablet Take 4 mg by mouth every 12 (twelve) hours as needed for muscle spasms.     [provider]  triamcinolone (KENALOG) 0.025 % ointment  Apply 1 application topically 2 (two) times daily. 01/30/18   Osborne Oman, MD    Family History Family History  Problem Relation Age of Onset  . Migraines Mother   . Heart disease Father   . Migraines Father   . Migraines Daughter   . Breast cancer Neg Hx     Social History Social History   Tobacco Use  . Smoking status:  Never Smoker  . Smokeless tobacco: Never Used  Substance Use Topics  . Alcohol use: No    Alcohol/week: 0.0 standard drinks  . Drug use: No     Allergies   Penicillins; Prednisone; and Zithromax [azithromycin]   Review of Systems Review of Systems   Physical Exam Triage Vital Signs ED Triage Vitals  Enc Vitals Group     BP 02/01/18 1534 126/71     Pulse Rate 02/01/18 1534 71     Resp 02/01/18 1534 18     Temp 02/01/18 1534 97.8 F (36.6 C)     Temp Source 02/01/18 1534 Oral     SpO2 02/01/18 1534 99 %     Weight --      Height --      Head Circumference --      Peak Flow --      Pain Score 02/01/18 1529 6     Pain Loc --      Pain Edu? --      Excl. in [place]? --    No data found.  Updated Vital Signs BP 126/71 (BP Location: Right Arm)   Pulse 71   Temp 97.8 F (36.6 C) (Oral)   Resp 18   LMP 01/13/2016 (Exact Date)   SpO2 99%    Physical Exam  Constitutional: She is oriented to person, place, and time. She appears well-developed and well-nourished. No distress.  Cardiovascular: Normal rate, regular rhythm and normal heart sounds.  Pulmonary/Chest: Effort normal and breath sounds normal.  Abdominal: Soft. There is no tenderness. There is no rigidity, no rebound, no guarding and no CVA tenderness.  Genitourinary: There is no rash, tenderness, lesion or injury on the right labia. There is no rash, tenderness, lesion or injury on the left labia. Vaginal discharge found.  Genitourinary Comments: Creamy white vaginal discharge noted, no odor; no vulvar lesions or sores noted  Neurological: She is alert and oriented to person,  place, and time.  Skin: Skin is warm and dry.     UC Treatments / Results  Labs (all labs ordered are listed, but only abnormal results are displayed) Labs Reviewed  CERVICOVAGINAL ANCILLARY ONLY    EKG None  Radiology No results found.  Procedures Procedures (including critical care time)  Medications Ordered in UC Medications - No data to display  Initial Impression / Assessment and Plan / UC Course  I have reviewed the triage vital signs and the nursing notes.  Pertinent labs & imaging results that were available during my care of the patient were reviewed by me and considered in my medical decision making (see chart for details).     Exam is generally normal, benign findings. Discussed normal skin hyperpigmentation. No sores or lesions. reswabbed patient. Will notify of any positive findings and if any changes to treatment are needed.  Negative screening three days ago. Vulvar skin care discussed. To continue to follow with gynecology as needed. Patient verbalized understanding and agreeable to plan.   Final Clinical Impressions(s) / UC Diagnoses   Final diagnoses:  Vaginitis and vulvovaginitis     Discharge Instructions     You may take the second pill provided for yeast, however the testing was negative at your Gynecologist visit.  Use of the provided cream to vulva as needed for irritation.  Keep skin clean and dry, avoid use of the new soap.  Will notify you of any positive findings from your vaginal sample and if any changes to treatment are needed.  Please follow up with gynecology as needed for persistent symptoms.    ED Prescriptions    None     Controlled Substance Prescriptions [place] Controlled Substance Registry consulted? Not Applicable   Zigmund Gottron, NP 02/01/18 1623

## 2018-02-03 ENCOUNTER — Telehealth (HOSPITAL_COMMUNITY): Payer: Self-pay | Admitting: Emergency Medicine

## 2018-02-03 LAB — CERVICOVAGINAL ANCILLARY ONLY
Bacterial vaginitis: NEGATIVE
Candida vaginitis: NEGATIVE
Chlamydia: NEGATIVE
Neisseria Gonorrhea: NEGATIVE
Trichomonas: NEGATIVE

## 2018-02-03 NOTE — Telephone Encounter (Signed)
PT called and made aware that our swab has not resulted yet. PT has already spoken to her OBGYN about other results. PT will call back this afternoon to check for results.

## 2018-02-10 ENCOUNTER — Telehealth: Payer: Self-pay | Admitting: *Deleted

## 2018-02-10 ENCOUNTER — Encounter: Payer: Self-pay | Admitting: Obstetrics & Gynecology

## 2018-02-10 ENCOUNTER — Ambulatory Visit (INDEPENDENT_AMBULATORY_CARE_PROVIDER_SITE_OTHER): Payer: Medicaid Other | Admitting: Obstetrics & Gynecology

## 2018-02-10 VITALS — BP 137/83 | HR 109 | Wt 192.0 lb

## 2018-02-10 DIAGNOSIS — N9089 Other specified noninflammatory disorders of vulva and perineum: Secondary | ICD-10-CM | POA: Diagnosis not present

## 2018-02-10 NOTE — Patient Instructions (Signed)

## 2018-02-10 NOTE — Progress Notes (Signed)
GYNECOLOGY OFFICE VISIT NOTE  History:  47 y.o. [id number] here today for evaluation of vulvar swelling for a few days. Has been treated for perianal rash with Kenalogsince last visit on 01/30/18 , was also seen in ED for same complaint. Negative vaginal discharge testing. She denies any abnormal vaginal discharge, bleeding, pelvic pain or other concerns.   Past Medical History:  Diagnosis Date  . Anemia    history   . Anxiety   . Back pain    rotates to left hip and tailbone  . Depression   . DUB (dysfunctional uterine bleeding) 12/01/2010  . Endometriosis   . Fractured coccyx ([place])   . Heart murmur    no problems  . History of seasonal allergies   . Hyperlipidemia   . Migraine   . Migraines    tx w/depakote/verapamil per pt  . Murmur, heart   . 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 Post traumatic stress disorder (PTSD)   . Seizures ([place]) 2004   x 1; unknown source  . Uterine leiomyoma     Past Surgical History:  Procedure Laterality Date  . CHOLECYSTECTOMY    . HERNIA REPAIR    . HYSTERECTOMY ABDOMINAL WITH SALPINGECTOMY Left 02/07/2016   Procedure: HYSTERECTOMY ABDOMINAL WITH  left SALPINGECTOMY;  Surgeon: Mora Bellman, MD;  Location: [facility];  Service: Gynecology;  Laterality: Left;  . LAPAROSCOPY  05/16/2011   Procedure: LAPAROSCOPY OPERATIVE;  Surgeon: Emeterio Reeve, MD;  Location: [facility];  Service: Gynecology;  Laterality: N/A;  poss oophorectomy  . LAPAROSCOPY  08/06/2011   Procedure: LAPAROSCOPY OPERATIVE;  Surgeon: Woodroe Mode, MD;  Location: [facility];  Service: Gynecology;  Laterality: N/A;  . LAPAROTOMY     for endometriosis/adhesions  . SALPINGOOPHORECTOMY  08/06/2011   Procedure: SALPINGO OOPHERECTOMY;  Surgeon: Woodroe Mode, MD;  Location: [facility];  Service: Gynecology;  Laterality: Right;  . SVD     x 2  . TUBAL LIGATION    . WISDOM TOOTH EXTRACTION      The following portions of the patient's history were  reviewed and updated as appropriate: allergies, current medications, past family history, past medical history, past social history, past surgical history and problem list.   Health Maintenance:  Normal pap and negative HRHPV on 12/04/2017.  Normal mammogram on 12/13/2017.   Review of Systems:  Pertinent items noted in HPI and remainder of comprehensive ROS otherwise negative.  Objective:  Physical Exam BP 137/83   Pulse (!) 109   Wt 192 lb (87.1 kg)   LMP 01/13/2016 (Exact Date)   BMI 26.41 kg/m  CONSTITUTIONAL: Well-developed, well-nourished female in no acute distress.  MUSCULOSKELETAL: Normal range of motion. No edema noted.Marland Kitchen CARDIOVASCULAR: Normal heart rate noted RESPIRATORY: Effort and breath sounds normal, no problems with respiration noted ABDOMEN: Soft, no distention noted.   PELVIC: Normal appearing external genitalia, no edema. Normal Bartholin and Skene's gland. Resolved papular rash noted in the perianal area, no erythema, no drainage; bumps felt by patient corresponded to hair follicles. No rash noted junctions between vulva and inner thighs bilaterally, no erythema.  Assessment & Plan:  1. Vulvar irritation Negative exam, patient reassured that she was normal, no further interventions are needed. Further intervention by her may cause new problems. Proper vulvar hygiene re-emphasized: discussed avoidance of perfumed soaps, detergents, lotions and any type of douches; in addition to wearing cotton underwear and no underwear at night.  Also recommended cleaning  front to back, voiding and cleaning up after intercourse.   Please refer to After Visit Summary for other counseling recommendations.   Return if symptoms worsen or fail to improve.   Total face-to-face time with patient: 10 minutes.  Over 50% of encounter was spent on counseling and coordination of care.   Verita Schneiders, MD, [place] for [employer],  [place]

## 2018-02-10 NOTE — Telephone Encounter (Signed)
Pt called stating she has a new irritation going on in her vagina. Made appointment for pt to be seen today to be evaluated.

## 2018-02-13 ENCOUNTER — Ambulatory Visit: Payer: Medicaid Other | Admitting: Obstetrics & Gynecology

## 2018-02-17 ENCOUNTER — Telehealth: Payer: Self-pay | Admitting: Neurology

## 2018-02-17 NOTE — Telephone Encounter (Signed)
Patient called regarding her Botox. Please Call. Thanks

## 2018-02-17 NOTE — Telephone Encounter (Signed)
Called and spoke with Pt. She is receiving calls from [facility] about Botox. I advised her , her appt is 04/18/17, will need to have Botox delivered prior to that appt, Botox apprvl valid for that appt

## 2018-02-18 ENCOUNTER — Telehealth: Payer: Self-pay | Admitting: Oncology

## 2018-02-18 ENCOUNTER — Ambulatory Visit: Payer: Medicaid Other | Admitting: Oncology

## 2018-02-18 ENCOUNTER — Other Ambulatory Visit: Payer: Medicaid Other

## 2018-02-18 NOTE — Telephone Encounter (Signed)
R/s appt per 1/15 sch message - pt is aware of appt date and time   

## 2018-02-19 ENCOUNTER — Telehealth: Payer: Self-pay | Admitting: Neurology

## 2018-02-19 NOTE — Telephone Encounter (Signed)
Called and sp[oke with Pt. She has had a headache since Monday. She took one eletriptan on Monday with little relief, she did not repeat of take naproxen with it. She took another eletriptan last night with some relief. I advised her to take eletriptan now with naproxen, to hydrate, rest and if headache still present tomorrow, call and I will discuss with Dr Tomi Likens about prednisone or injection

## 2018-02-19 NOTE — Telephone Encounter (Signed)
Patient has a migraine and she is going to have to cancel her appt at different Dr office sue to her migraine. She is not wanting to chance driving with the headache that she is having.

## 2018-02-26 ENCOUNTER — Inpatient Hospital Stay: Payer: Medicaid Other | Attending: Oncology

## 2018-02-26 ENCOUNTER — Inpatient Hospital Stay (HOSPITAL_BASED_OUTPATIENT_CLINIC_OR_DEPARTMENT_OTHER): Payer: Medicaid Other | Admitting: Oncology

## 2018-02-26 ENCOUNTER — Telehealth: Payer: Self-pay | Admitting: Oncology

## 2018-02-26 VITALS — BP 113/82 | HR 84 | Temp 98.4°F | Resp 18 | Ht 71.5 in | Wt 187.9 lb

## 2018-02-26 DIAGNOSIS — D500 Iron deficiency anemia secondary to blood loss (chronic): Secondary | ICD-10-CM | POA: Diagnosis present

## 2018-02-26 DIAGNOSIS — N920 Excessive and frequent menstruation with regular cycle: Secondary | ICD-10-CM | POA: Insufficient documentation

## 2018-02-26 DIAGNOSIS — Z1211 Encounter for screening for malignant neoplasm of colon: Secondary | ICD-10-CM | POA: Insufficient documentation

## 2018-02-26 DIAGNOSIS — Z79899 Other long term (current) drug therapy: Secondary | ICD-10-CM | POA: Diagnosis not present

## 2018-02-26 LAB — CBC WITH DIFFERENTIAL (CANCER CENTER ONLY)
Abs Immature Granulocytes: 0.02 10*3/uL (ref 0.00–0.07)
Basophils Absolute: 0 10*3/uL (ref 0.0–0.1)
Basophils Relative: 0 %
Eosinophils Absolute: 0 10*3/uL (ref 0.0–0.5)
Eosinophils Relative: 1 %
HCT: 39.3 % (ref 36.0–46.0)
Hemoglobin: 12.6 g/dL (ref 12.0–15.0)
Immature Granulocytes: 0 %
Lymphocytes Relative: 20 %
Lymphs Abs: 1.7 10*3/uL (ref 0.7–4.0)
MCH: 29.1 pg (ref 26.0–34.0)
MCHC: 32.1 g/dL (ref 30.0–36.0)
MCV: 90.8 fL (ref 80.0–100.0)
Monocytes Absolute: 0.8 10*3/uL (ref 0.1–1.0)
Monocytes Relative: 9 %
Neutro Abs: 5.8 10*3/uL (ref 1.7–7.7)
Neutrophils Relative %: 70 %
Platelet Count: 299 10*3/uL (ref 150–400)
RBC: 4.33 MIL/uL (ref 3.87–5.11)
RDW: 13.5 % (ref 11.5–15.5)
WBC Count: 8.3 10*3/uL (ref 4.0–10.5)
nRBC: 0 % (ref 0.0–0.2)

## 2018-02-26 LAB — IRON AND TIBC
Iron: 72 ug/dL (ref 41–142)
Saturation Ratios: 19 % — ABNORMAL LOW (ref 21–57)
TIBC: 381 ug/dL (ref 236–444)
UIBC: 310 ug/dL (ref 120–384)

## 2018-02-26 LAB — FERRITIN: Ferritin: 782 ng/mL — ABNORMAL HIGH (ref 11–307)

## 2018-02-26 NOTE — Telephone Encounter (Signed)
Scheduled appt per 11/13 los - gave patient AVS and calender per los.   

## 2018-02-26 NOTE — Progress Notes (Signed)
Hematology and Oncology Follow Up Visit  Christine Vega [id number] 1970/06/22 47 y.o. 02/26/2018 2:48 PM Calton Dach, Henderson Newcomer, MD   Principle Diagnosis: 13 year old with anemia related to iron deficiency and chronic blood losses and menorrhagia diagnosed in June 2017.    Prior Therapy: She is status post Feraheme infusion for a total of 1000 mg completed on multiple occasions.  Last treatment given in May 2018.  Current therapy: Oral iron replacement.  Interim History: Christine Vega returns today for a repeat evaluation.  Since her last visit, she reports feeling reasonably fair without any recent complaints.  She does report dry skin and occasional darkening of her skin and some mild fatigue and wondering if this is related to worsening iron deficiency.  She denies any hematochezia, melena or menorrhagia.  She is no longer having any menstrual cycles at this time.  He remains active and continues to attend activities of daily living.  She denies any changes in her appetite or weight loss.  She did not report any major changes in her bowel habits.  She does not report any headaches, blurry vision, syncope or seizures.  She denies any alteration of mental status or confusion.  She he does not report any fevers or chills or sweats. She does not report any cough, wheezing or hemoptysis. She does not report any nausea or vomiting or early satiety.  She does not report any frequency urgency or hesitancy. She does not report any arthralgias or myalgias.  Denies any heat or cold intolerance.  Denies any lymphadenopathy or petechiae.  Remaining review of systems is negative.   Medications: I have reviewed the patient's current medications.  Current Outpatient Medications  Medication Sig Dispense Refill  . buPROPion (WELLBUTRIN XL) 150 MG 24 hr tablet Take 150 mg by mouth daily.    . butalbital-acetaminophen-caffeine (FIORICET, ESGIC) 50-325-40 MG tablet Take 1 tablet by mouth every 8  (eight) hours as needed for migraine.    . cetirizine (ZYRTEC) 10 MG tablet Take 10 mg by mouth daily as needed for allergies.    . cyclobenzaprine (FLEXERIL) 5 MG tablet Take 1 tablet (5 mg total) by mouth 3 (three) times daily as needed for muscle spasms. 30 tablet 0  . divalproex (DEPAKOTE) 500 MG DR tablet TAKE 2 TABLETS BY MOUTH TWICE DAILY 120 tablet 2  . eletriptan (RELPAX) 40 MG tablet Take 1 tablet (40 mg total) as needed by mouth for migraine or headache. May repeat in 2 hours if headache persists or recurs. No more than 2 in 24 hours 10 tablet 3  . eletriptan (RELPAX) 40 MG tablet Take 1 tablet (40 mg total) by mouth as needed for migraine or headache. May repeat in 2 hrs if headache persists. No more than 2 tablets/24 hrs. 10 tablet 3  . gabapentin (NEURONTIN) 300 MG capsule Take 300 mg by mouth 3 (three) times daily.    Marland Kitchen HYDROcodone-acetaminophen (NORCO/VICODIN) 5-325 MG tablet TK 1 T PO Q 6 H  0  . hydrOXYzine (VISTARIL) 25 MG capsule Take 25 mg by mouth 3 (three) times daily as needed for anxiety.    . meloxicam (MOBIC) 15 MG tablet Take 1 tablet (15 mg total) by mouth daily. 30 tablet 1  . naproxen (NAPROSYN) 500 MG tablet Take 1 tablet (500 mg total) by mouth every 12 (twelve) hours as needed. 16 tablet 2  . prochlorperazine (COMPAZINE) 10 MG tablet Take 10 mg by mouth every 6 (six) hours as needed for nausea  or vomiting.    . promethazine (PROMETHEGAN) 50 MG suppository Place 50 mg rectally every 8 (eight) hours as needed for nausea or vomiting.     . propranolol ER (INDERAL LA) 120 MG 24 hr capsule Take 120 mg by mouth daily.    . sertraline (ZOLOFT) 100 MG tablet Take 100 mg by mouth daily.    . simvastatin (ZOCOR) 20 MG tablet TK 1 T PO D  1  . tiZANidine (ZANAFLEX) 4 MG tablet Take 4 mg by mouth every 12 (twelve) hours as needed for muscle spasms.     Marland Kitchen triamcinolone (KENALOG) 0.025 % ointment Apply 1 application topically 2 (two) times daily. 80 g 2   No current  facility-administered medications for this visit.      Allergies:  Allergies  Allergen Reactions  . Penicillins Hives, Itching and Other (See Comments)    Has patient had a PCN reaction causing immediate rash, facial/tongue/throat swelling, SOB or lightheadedness with hypotension: No Has patient had a PCN reaction causing severe rash involving mucus membranes or skin necrosis: No Has patient had a PCN reaction that required hospitalization: No Has patient had a PCN reaction occurring within the last 10 years: Yes If all of the above answers are "NO", then may proceed with Cephalosporin use.   . Prednisone Hives and Itching  . Zithromax [Azithromycin] Hives and Itching    Past Medical History, Surgical history, Social history, and Family History were reviewed and updated.   Physical Exam: Blood pressure 113/82, pulse 84, temperature 98.4 F (36.9 C), temperature source Oral, resp. rate 18, height 5' 11.5" (1.816 m), weight 187 lb 14.4 oz (85.2 kg), last menstrual period 01/13/2016, SpO2 98 %.   ECOG:0  General appearance: Comfortable appearing without any discomfort Head: Normocephalic without any trauma Oropharynx: Mucous membranes are moist and pink without any thrush or ulcers. Eyes: Pupils are equal and round reactive to light. Lymph nodes: No cervical, supraclavicular, inguinal or axillary lymphadenopathy.   Heart:regular rate and rhythm.  S1 and S2 without leg edema. Lung: Clear without any rhonchi or wheezes.  No dullness to percussion. Abdomin: Soft, nontender, nondistended with good bowel sounds.  No hepatosplenomegaly. Musculoskeletal: No joint deformity or effusion.  Full range of motion noted. Neurological: No deficits noted on motor, sensory and deep tendon reflex exam. Skin: No petechial rash or dryness.  Appeared moist.  Psychiatric: Mood and affect appeared appropriate.    Lab Results: Lab Results  Component Value Date   WBC 5.9 07/29/2017   HGB 12.2  07/29/2017   HCT 37.0 07/29/2017   MCV 89.2 07/29/2017   PLT 230 07/29/2017     Chemistry      Component Value Date/Time   NA 137 05/16/2014 1816   K 4.2 05/16/2014 1816   CL 103 05/16/2014 1816   CO2 27 05/16/2014 1816   BUN 7 05/16/2014 1816   CREATININE 0.73 05/16/2014 1816      Component Value Date/Time   CALCIUM 9.5 05/16/2014 1816   ALKPHOS 52 05/16/2014 1816   AST 16 05/16/2014 1816   ALT 13 05/16/2014 1816   BILITOT 0.2 (L) 05/16/2014 1816        Impression and Plan:   47 year old woman with:  1.  Anemia diagnosed in June 2017 related to iron deficiency anemia and menorrhagia.  She has received intravenous iron in the past and has been in oral iron therapy.  Her hemoglobin today is within normal range and iron studies are currently pending.  Depending  on her iron studies we will determine whether she will need any additional iron supplements.  At this time I recommended continuing oral iron therapy as a maintenance basis and will retreat her with intravenous iron as needed.  She is agreeable at this time.  Other causes for her anemia was also reviewed today and appears to be unlikely.  Management options were reviewed which include continuing oral iron versus intravenous iron was also discussed.     2. Colon cancer screening: I recommended colonoscopy screening as she is approaching the age of 60 age-appropriate maintenance.     3. Follow-up: Will be in 6 months to repeat iron studies.   15  minutes was spent with the patient face-to-face today.  More than 50% of time was dedicated to reviewing patient's labs, differential diagnosis and management options     Zola Button, MD 11/13/20192:48 PM

## 2018-02-28 ENCOUNTER — Telehealth: Payer: Self-pay

## 2018-02-28 NOTE — Telephone Encounter (Signed)
Contacted patient and made aware of iron results and no iron infusion needed at this time. Patient appreciative and no other questions or concerns.

## 2018-02-28 NOTE — Telephone Encounter (Signed)
-----   Message from Wyatt Portela, MD sent at 02/27/2018  2:34 PM EST ----- Regarding: RE: Iron level and plan Please let her know her iron is normal. No need for iron infusions.  Thanks.  ----- Message ----- From: Scot Dock, RN Sent: 02/27/2018   2:23 PM EST To: Wyatt Portela, MD Subject: Iron level and plan                            Patient called inquiring about iron level and if any need for treatment.

## 2018-03-05 ENCOUNTER — Encounter: Payer: Self-pay | Admitting: Family Medicine

## 2018-03-05 ENCOUNTER — Ambulatory Visit (INDEPENDENT_AMBULATORY_CARE_PROVIDER_SITE_OTHER): Payer: Medicaid Other | Admitting: Family Medicine

## 2018-03-05 VITALS — BP 131/97 | HR 72 | Wt 188.4 lb

## 2018-03-05 DIAGNOSIS — N898 Other specified noninflammatory disorders of vagina: Secondary | ICD-10-CM | POA: Diagnosis not present

## 2018-03-05 NOTE — Progress Notes (Signed)
Patient reports some irritation around her groin area. She is not using the cream as prescribed.

## 2018-03-05 NOTE — Progress Notes (Signed)
   GYNECOLOGY PROBLEM  VISIT ENCOUNTER NOTE  Subjective:   Christine Vega is a 47 y.o. G34P2002 female here for a a problem GYN visit.  Current complaints: vaginal irritation that started today, located at the based of introitus and no aggravating or alleviating.   Denies abnormal vaginal bleeding, discharge, pelvic pain, problems with intercourse or other gynecologic concerns.   Chief Complaint  Patient presents with  . Vaginal irritation      Gynecologic History Patient's last menstrual period was 01/13/2016 (exact date). Contraception: status post hysterectomy  Health Maintenance Due  Topic Date Due  . TETANUS/TDAP  09/24/1987  . INFLUENZA VACCINE  11/14/2017     The following portions of the patient's history were reviewed and updated as appropriate: allergies, current medications, past family history, past medical history, past social history, past surgical history and problem list.  Review of Systems Pertinent items are noted in HPI.   Objective:  BP (!) 131/97   Pulse 72   Wt 188 lb 6.4 oz (85.5 kg)   LMP 01/13/2016 (Exact Date)   BMI 25.91 kg/m  Gen: well appearing, NAD HEENT: no scleral icterus CV: RR Lung: Normal WOB Ext: warm well perfused  PELVIC: Normal appearing external genitalia; normal appearing vaginal mucosa and cervix.  No abnormal discharge noted.     Assessment and Plan:  1. Vaginal irritation Normal exam Recommended watchful waititing, if sx persist for > 1 week she could use the steroid cream for 1 week at most.  Discussed possible irritation from underwear and soaps   Please refer to After Visit Summary for other counseling recommendations.   Return if symptoms worsen or fail to improve.  Caren Macadam, MD, MPH, ABFM Attending [place] for [facility]

## 2018-03-26 ENCOUNTER — Ambulatory Visit (INDEPENDENT_AMBULATORY_CARE_PROVIDER_SITE_OTHER): Payer: Medicaid Other | Admitting: Advanced Practice Midwife

## 2018-03-26 ENCOUNTER — Other Ambulatory Visit (HOSPITAL_COMMUNITY)
Admission: RE | Admit: 2018-03-26 | Discharge: 2018-03-26 | Disposition: A | Discharge status: Home / Self Care | Payer: Medicaid Other | Source: Ambulatory Visit | Attending: Advanced Practice Midwife | Admitting: Advanced Practice Midwife

## 2018-03-26 ENCOUNTER — Encounter: Payer: Self-pay | Admitting: Advanced Practice Midwife

## 2018-03-26 VITALS — BP 139/80 | HR 72

## 2018-03-26 DIAGNOSIS — N898 Other specified noninflammatory disorders of vagina: Secondary | ICD-10-CM

## 2018-03-26 MED ORDER — FLUCONAZOLE 150 MG PO TABS: 150.0000 mg | ORAL_TABLET | Freq: Once | ORAL | 0 refills | Status: AC

## 2018-03-26 NOTE — Progress Notes (Signed)
GYNECOLOGY PROBLEM VISIT NOTE  Subjective:   Christine Vega is a 47 y.o. G30P2002 female here for evaluation of vaginal irritation. This is a recurring problem involving clusters of visits to multiple providers. Patient states she experienced temporary relief of vaginal irritation with the Diflucan I prescribed Oct 16. She states she was unaware of a prescription for Kenalog cream sent to her pharmacy 10/17. She states she experiences both internal and external irritation. She endorses taking frequent baths which she immediately follows with a shower. She denies abnormal vaginal bleeding, discharge, pelvic pain, problems with intercourse or other gynecologic concerns.    Gynecologic History Patient's last menstrual period was 01/13/2016 (exact date). Contraception: condoms and tubal ligation currently not sexually active Last Pap: 12/04/2017. Results were: normal with negative HPV   Obstetric History OB History  Gravida Para Term Preterm AB Living  2 2 2  0 0 2  SAB TAB Ectopic Multiple Live Births  0 0 0 0 2    # Outcome Date GA Lbr Len/2nd Weight Sex Delivery Anes PTL Lv  2 Term      Vag-Spont   LIV  1 Term      Vag-Spont   LIV    Past Medical History:  Diagnosis Date  . Anemia    history   . Anxiety   . Back pain    rotates to left hip and tailbone  . Depression   . DUB (dysfunctional uterine bleeding) 12/01/2010  . Endometriosis   . Fractured coccyx ([place])   . Heart murmur    no problems  . History of seasonal allergies   . Hyperlipidemia   . Migraine   . Migraines    tx w/depakote/verapamil per pt  . Murmur, heart   . Neuromuscular disorder ([place])    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, walks/balance ok  . Post traumatic stress disorder (PTSD)   . Seizures ([place]) 2004   x 1; unknown source  . Uterine leiomyoma     Past Surgical History:  Procedure Laterality Date  . CHOLECYSTECTOMY    . HERNIA REPAIR    . HYSTERECTOMY ABDOMINAL WITH SALPINGECTOMY Left 02/07/2016   Procedure: HYSTERECTOMY ABDOMINAL WITH  left SALPINGECTOMY;  Surgeon: Mora Bellman, MD;  Location: [facility];  Service: Gynecology;  Laterality: Left;  . LAPAROSCOPY  05/16/2011   Procedure: LAPAROSCOPY OPERATIVE;  Surgeon: Emeterio Reeve, MD;  Location: [facility];  Service: Gynecology;  Laterality: N/A;  poss oophorectomy  . LAPAROSCOPY  08/06/2011   Procedure: LAPAROSCOPY OPERATIVE;  Surgeon: Woodroe Mode, MD;  Location: [facility];  Service: Gynecology;  Laterality: N/A;  . LAPAROTOMY     for endometriosis/adhesions  . SALPINGOOPHORECTOMY  08/06/2011   Procedure: SALPINGO OOPHERECTOMY;  Surgeon: Woodroe Mode, MD;  Location: [facility];  Service: Gynecology;  Laterality: Right;  . SVD     x 2  . TUBAL LIGATION    . WISDOM TOOTH EXTRACTION      Current Outpatient Medications on File Prior to Visit  Medication Sig Dispense Refill  . buPROPion (WELLBUTRIN XL) 150 MG 24 hr tablet Take 150 mg by mouth daily.    . butalbital-acetaminophen-caffeine (FIORICET, ESGIC) 50-325-40 MG tablet Take 1 tablet by mouth every 8 (eight) hours as needed for migraine.    . cetirizine (ZYRTEC) 10 MG tablet Take 10 mg by mouth daily as needed for allergies.    Marland Kitchen divalproex (DEPAKOTE) 500 MG DR tablet TAKE 2 TABLETS BY MOUTH TWICE DAILY 120 tablet 2  .  eletriptan (RELPAX) 40 MG tablet Take 1 tablet (40 mg total) as needed by mouth for migraine or headache. May repeat in 2 hours if headache persists or recurs. No more than 2 in 24 hours 10 tablet 3  . eletriptan (RELPAX) 40 MG tablet Take 1 tablet (40 mg total) by mouth as needed for migraine or headache. May repeat in 2 hrs if headache persists. No more than 2 tablets/24 hrs. 10 tablet 3  . gabapentin (NEURONTIN) 300 MG capsule Take 300 mg by mouth 3 (three) times daily.    . hydrOXYzine (VISTARIL) 25 MG capsule Take 25 mg by mouth 3 (three) times daily as needed for anxiety.    . meloxicam (MOBIC) 15 MG tablet Take 1 tablet (15 mg total) by mouth daily. 30 tablet 1  .  naproxen (NAPROSYN) 500 MG tablet Take 1 tablet (500 mg total) by mouth every 12 (twelve) hours as needed. 16 tablet 2  . prochlorperazine (COMPAZINE) 10 MG tablet Take 10 mg by mouth every 6 (six) hours as needed for nausea or vomiting.    . promethazine (PROMETHEGAN) 50 MG suppository Place 50 mg rectally every 8 (eight) hours as needed for nausea or vomiting.     . sertraline (ZOLOFT) 100 MG tablet Take 100 mg by mouth daily.    . simvastatin (ZOCOR) 20 MG tablet TK 1 T PO D  1  . tiZANidine (ZANAFLEX) 4 MG tablet Take 4 mg by mouth every 12 (twelve) hours as needed for muscle spasms.     . cyclobenzaprine (FLEXERIL) 5 MG tablet Take 1 tablet (5 mg total) by mouth 3 (three) times daily as needed for muscle spasms. (Patient not taking: Reported on 03/26/2018) 30 tablet 0  . HYDROcodone-acetaminophen (NORCO/VICODIN) 5-325 MG tablet TK 1 T PO Q 6 H  0  . propranolol ER (INDERAL LA) 120 MG 24 hr capsule Take 120 mg by mouth daily.    Marland Kitchen triamcinolone (KENALOG) 0.025 % ointment Apply 1 application topically 2 (two) times daily. (Patient not taking: Reported on 03/05/2018) 80 g 2   No current facility-administered medications on file prior to visit.     Allergies  Allergen Reactions  . Penicillins Hives, Itching and Other (See Comments)    Has patient had a PCN reaction causing immediate rash, facial/tongue/throat swelling, SOB or lightheadedness with hypotension: No Has patient had a PCN reaction causing severe rash involving mucus membranes or skin necrosis: No Has patient had a PCN reaction that required hospitalization: No Has patient had a PCN reaction occurring within the last 10 years: Yes If all of the above answers are "NO", then may proceed with Cephalosporin use.   . Prednisone Hives and Itching  . Zithromax [Azithromycin] Hives and Itching    Social History:  reports that she has never smoked. She has never used smokeless tobacco. She reports that she does not drink alcohol or use  drugs.  Family History  Problem Relation Age of Onset  . Migraines Mother   . Heart disease Father   . Migraines Father   . Migraines Daughter   . Breast cancer Neg Hx     The following portions of the patient's history were reviewed and updated as appropriate: allergies, current medications, past family history, past medical history, past social history, past surgical history and problem list.  Review of Systems Pertinent items noted in HPI and remainder of comprehensive ROS otherwise negative.   Objective:  BP 139/80   Pulse 72   LMP 01/13/2016 (  Exact Date)  CONSTITUTIONAL: Well-developed, well-nourished female in no acute distress.  HENT:  Normocephalic, atraumatic, External right and left ear normal. Oropharynx is clear and moist EYES: Conjunctivae and EOM are normal. Pupils are equal, round, and reactive to light. No scleral icterus.  NECK: Normal range of motion, supple, no masses.  Normal thyroid.  SKIN: Skin is warm and dry. No rash noted. Not diaphoretic. No erythema. No pallor. MUSCULOSKELETAL: Normal range of motion. No tenderness.  No cyanosis, clubbing, or edema.  2+ distal pulses. NEUROLOGIC: Alert and oriented to person, place, and time. Normal reflexes, muscle tone coordination. No cranial nerve deficit noted. PSYCHIATRIC: Normal mood and affect. Normal behavior. Normal judgment and thought content. CARDIOVASCULAR: Normal heart rate noted, regular rhythm RESPIRATORY: Clear to auscultation bilaterally. Effort and breath sounds normal, no problems with respiration noted. BREASTS: Symmetric in size. No masses, skin changes, nipple drainage, or lymphadenopathy. ABDOMEN: Soft, normal bowel sounds, no distention noted.  No tenderness, rebound or guarding.  PELVIC: Normal appearing external genitalia; normal appearing vaginal mucosa and cervix.  Thick white vaginal discharge noted.  Pap smear obtained.  Normal uterine size, no other palpable masses, no uterine or adnexal  tenderness. No discomfort with placement of metal speculum  Assessment and Plan:  1. Vaginal irritation - Likely yeast infection, patient advised to start Monistat 7 if she feels the need for immediate treatment - Diflucan rx to pharmacy per patient request - Discussed relationship between elevated blood sugar and recurrent yeast infections - Patient to d/c double-washing with bath followed by shower. Consider sitz bath for irritation/inflammation - Cervicovaginal ancillary only( [place])  Routine preventative health maintenance measures emphasized. Please refer to After Visit Summary for other counseling recommendations.    Mallie Snooks, [place] for [employer], [place]

## 2018-03-28 ENCOUNTER — Other Ambulatory Visit: Payer: Self-pay | Admitting: Obstetrics & Gynecology

## 2018-03-28 LAB — CERVICOVAGINAL ANCILLARY ONLY
Bacterial vaginitis: NEGATIVE
Candida vaginitis: POSITIVE — AB
Chlamydia: NEGATIVE
Neisseria Gonorrhea: NEGATIVE
Trichomonas: NEGATIVE

## 2018-04-02 ENCOUNTER — Other Ambulatory Visit: Payer: Self-pay | Admitting: Orthopedic Surgery

## 2018-04-02 DIAGNOSIS — M25512 Pain in left shoulder: Secondary | ICD-10-CM

## 2018-04-07 ENCOUNTER — Encounter: Payer: Self-pay | Admitting: *Deleted

## 2018-04-07 NOTE — Progress Notes (Signed)
I called [phone] [option 3 then 1 then 9] to set up Botox delivery for December 27th. I also got 4 refills on the order but will still need PA from [place] [facility]. Called pt to let her know she needs to call [phone] to approve delivery of Botox if she does not hear from them today she needs to call by noon tomorrow. She was at the dentist office so she asked I call and leave this info on her voice mail and I did.

## 2018-04-13 ENCOUNTER — Other Ambulatory Visit: Payer: Medicaid Other

## 2018-04-18 ENCOUNTER — Telehealth: Payer: Self-pay | Admitting: Neurology

## 2018-04-18 ENCOUNTER — Ambulatory Visit: Payer: Medicaid Other | Admitting: Neurology

## 2018-04-18 NOTE — Telephone Encounter (Signed)
I relayed info about BOTOX and added her to waitlist but she still wants to speak with you. Please give her a call back. Thanks!

## 2018-04-18 NOTE — Telephone Encounter (Signed)
Called and spoke with Pt, she had temperature of 102 prior to Tylenol, she has diarrhea and severe headache. I advised her we can place her on wait list, she does not need to come in sick.

## 2018-04-18 NOTE — Telephone Encounter (Signed)
Patient is calling in about botox schedule. She is wanting to know if she doesn't come in today could she be worked in before the next botox day feb. Please call her back at [phone]. Thanks!

## 2018-04-18 NOTE — Telephone Encounter (Signed)
Patient called in stating that he PCP can vouch for her missing todays appt- Dr.Garber at [phone]. Ask to speak with [facility] if you need to call.

## 2018-04-30 ENCOUNTER — Telehealth: Payer: Self-pay

## 2018-04-30 NOTE — Telephone Encounter (Signed)
Rcvd call from [place] from Pt's pharmacy concerning a PA for eletriptan. I advised her we have not rcvd a fax and asked she fax again to our clinical fax

## 2018-05-01 ENCOUNTER — Other Ambulatory Visit: Payer: Self-pay | Admitting: Neurology

## 2018-05-07 ENCOUNTER — Ambulatory Visit (HOSPITAL_COMMUNITY)
Admission: EM | Admit: 2018-05-07 | Discharge: 2018-05-07 | Disposition: A | Discharge status: Other Acute Inpatient Hospital | Payer: Medicaid Other | Attending: Family Medicine | Admitting: Family Medicine

## 2018-05-07 ENCOUNTER — Other Ambulatory Visit: Payer: Self-pay

## 2018-05-07 ENCOUNTER — Emergency Department (HOSPITAL_COMMUNITY)
Admission: EM | Admit: 2018-05-07 | Discharge: 2018-05-08 | Disposition: A | Discharge status: Home / Self Care | Payer: Medicaid Other | Attending: Emergency Medicine | Admitting: Emergency Medicine

## 2018-05-07 ENCOUNTER — Emergency Department (HOSPITAL_COMMUNITY): Payer: Medicaid Other

## 2018-05-07 ENCOUNTER — Encounter (HOSPITAL_COMMUNITY): Payer: Self-pay

## 2018-05-07 ENCOUNTER — Encounter (HOSPITAL_COMMUNITY): Payer: Self-pay | Admitting: Emergency Medicine

## 2018-05-07 DIAGNOSIS — Y998 Other external cause status: Secondary | ICD-10-CM | POA: Diagnosis not present

## 2018-05-07 DIAGNOSIS — S3991XA Unspecified injury of abdomen, initial encounter: Secondary | ICD-10-CM | POA: Diagnosis present

## 2018-05-07 DIAGNOSIS — S80211A Abrasion, right knee, initial encounter: Secondary | ICD-10-CM | POA: Diagnosis not present

## 2018-05-07 DIAGNOSIS — Y9301 Activity, walking, marching and hiking: Secondary | ICD-10-CM | POA: Diagnosis not present

## 2018-05-07 DIAGNOSIS — W108XXA Fall (on) (from) other stairs and steps, initial encounter: Secondary | ICD-10-CM

## 2018-05-07 DIAGNOSIS — Z880 Allergy status to penicillin: Secondary | ICD-10-CM | POA: Insufficient documentation

## 2018-05-07 DIAGNOSIS — M542 Cervicalgia: Secondary | ICD-10-CM | POA: Diagnosis not present

## 2018-05-07 DIAGNOSIS — W19XXXA Unspecified fall, initial encounter: Secondary | ICD-10-CM

## 2018-05-07 DIAGNOSIS — Z79899 Other long term (current) drug therapy: Secondary | ICD-10-CM | POA: Diagnosis not present

## 2018-05-07 DIAGNOSIS — M545 Low back pain, unspecified: Secondary | ICD-10-CM

## 2018-05-07 DIAGNOSIS — W010XXA Fall on same level from slipping, tripping and stumbling without subsequent striking against object, initial encounter: Secondary | ICD-10-CM | POA: Insufficient documentation

## 2018-05-07 DIAGNOSIS — R1031 Right lower quadrant pain: Secondary | ICD-10-CM | POA: Diagnosis not present

## 2018-05-07 DIAGNOSIS — M25561 Pain in right knee: Secondary | ICD-10-CM

## 2018-05-07 DIAGNOSIS — M549 Dorsalgia, unspecified: Secondary | ICD-10-CM

## 2018-05-07 DIAGNOSIS — Y92009 Unspecified place in unspecified non-institutional (private) residence as the place of occurrence of the external cause: Secondary | ICD-10-CM | POA: Insufficient documentation

## 2018-05-07 DIAGNOSIS — S60419A Abrasion of unspecified finger, initial encounter: Secondary | ICD-10-CM

## 2018-05-07 LAB — CBC WITH DIFFERENTIAL/PLATELET
Abs Immature Granulocytes: 0.02 10*3/uL (ref 0.00–0.07)
Basophils Absolute: 0 10*3/uL (ref 0.0–0.1)
Basophils Relative: 0 %
Eosinophils Absolute: 0 10*3/uL (ref 0.0–0.5)
Eosinophils Relative: 1 %
HCT: 40.4 % (ref 36.0–46.0)
Hemoglobin: 12.7 g/dL (ref 12.0–15.0)
Immature Granulocytes: 0 %
Lymphocytes Relative: 29 %
Lymphs Abs: 2.2 10*3/uL (ref 0.7–4.0)
MCH: 28.3 pg (ref 26.0–34.0)
MCHC: 31.4 g/dL (ref 30.0–36.0)
MCV: 90 fL (ref 80.0–100.0)
Monocytes Absolute: 0.8 10*3/uL (ref 0.1–1.0)
Monocytes Relative: 10 %
Neutro Abs: 4.5 10*3/uL (ref 1.7–7.7)
Neutrophils Relative %: 60 %
Platelets: 258 10*3/uL (ref 150–400)
RBC: 4.49 MIL/uL (ref 3.87–5.11)
RDW: 13.3 % (ref 11.5–15.5)
WBC: 7.6 10*3/uL (ref 4.0–10.5)
nRBC: 0 % (ref 0.0–0.2)

## 2018-05-07 LAB — COMPREHENSIVE METABOLIC PANEL
ALT: 16 U/L (ref 0–44)
AST: 16 U/L (ref 15–41)
Albumin: 4.1 g/dL (ref 3.5–5.0)
Alkaline Phosphatase: 43 U/L (ref 38–126)
Anion gap: 11 (ref 5–15)
BUN: 5 mg/dL — ABNORMAL LOW (ref 6–20)
CO2: 21 mmol/L — ABNORMAL LOW (ref 22–32)
Calcium: 9.3 mg/dL (ref 8.9–10.3)
Chloride: 106 mmol/L (ref 98–111)
Creatinine, Ser: 0.6 mg/dL (ref 0.44–1.00)
GFR calc Af Amer: >60 mL/min (ref >60)
GFR calc non Af Amer: >60 mL/min (ref >60)
Glucose, Bld: 103 mg/dL — ABNORMAL HIGH (ref 70–99)
Potassium: 3.4 mmol/L — ABNORMAL LOW (ref 3.5–5.1)
Sodium: 138 mmol/L (ref 135–145)
Total Bilirubin: 0.4 mg/dL (ref 0.3–1.2)
Total Protein: 7.1 g/dL (ref 6.5–8.1)

## 2018-05-07 LAB — URINALYSIS, ROUTINE W REFLEX MICROSCOPIC
Bilirubin Urine: NEGATIVE
Glucose, UA: NEGATIVE mg/dL
Hgb urine dipstick: NEGATIVE
Ketones, ur: NEGATIVE mg/dL
Leukocytes, UA: NEGATIVE
Nitrite: NEGATIVE
Protein, ur: NEGATIVE mg/dL
Specific Gravity, Urine: 1.02 (ref 1.005–1.030)
pH: 5 (ref 5.0–8.0)

## 2018-05-07 LAB — PREGNANCY, URINE: Preg Test, Ur: NEGATIVE

## 2018-05-07 LAB — I-STAT BETA HCG BLOOD, ED (MC, WL, AP ONLY): I-stat hCG, quantitative: <5 m[IU]/mL (ref <5)

## 2018-05-07 MED ORDER — CYCLOBENZAPRINE HCL 5 MG PO TABS: 5.0000 mg | ORAL_TABLET | Freq: Three times a day (TID) | ORAL | 0 refills | Status: DC | PRN

## 2018-05-07 MED ORDER — MORPHINE SULFATE (PF) 4 MG/ML IV SOLN
4.0000 mg | Freq: Once | INTRAVENOUS | Status: AC
  Administered 2018-05-07: 4 mg via INTRAVENOUS
  Filled 2018-05-07: qty 1

## 2018-05-07 MED ORDER — SODIUM CHLORIDE 0.9 % IV BOLUS: 1000.0000 mL | Freq: Once | INTRAVENOUS | Status: DC

## 2018-05-07 MED ORDER — IOHEXOL 300 MG/ML  SOLN
100.0000 mL | Freq: Once | INTRAMUSCULAR | Status: AC | PRN
  Administered 2018-05-07: 100 mL via INTRAVENOUS

## 2018-05-07 NOTE — ED Provider Notes (Signed)
[facility] EMERGENCY DEPARTMENT Provider Note   CSN: [id number] Arrival date & time: 05/07/18  1717     History   Chief Complaint Chief Complaint  Patient presents with  . Fall  . Hand Injury  . Back Pain  . Knee Pain  . Abdominal Pain    HPI Christine Vega is a 48 y.o. female history of endometriosis status post surgery, hyperlipidemia, here presenting with fall, abdominal pain, back pain.  Patient states that she had a mechanical fall yesterday and tripped over her cell phone cord.  She did land on her knee and right hand and her abdomen.  She has been having some back pain and right lower quadrant pain since then.  Patient went to urgent care was sent here for possible appendicitis given the pain in her abdomen and further work-up.  Patient did not take any medicines prior to arrival.  The history is provided by the patient.    Past Medical History:  Diagnosis Date  . Anemia    history   . Anxiety   . Back pain    rotates to left hip and tailbone  . Depression   . DUB (dysfunctional uterine bleeding) 12/01/2010  . Endometriosis   . Fractured coccyx ([place])   . Heart murmur    no problems  . History of seasonal allergies   . Hyperlipidemia   . Migraine   . Migraines    tx w/depakote/verapamil per pt  . Murmur, heart   . 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 Post traumatic stress disorder (PTSD)   . Seizures ([place]) 2004   x 1; unknown source  . Uterine leiomyoma     Patient Active Problem List   Diagnosis Date Noted  . Vaginal irritation 03/05/2018  . Iron deficiency anemia due to chronic blood loss 04/05/2016  . 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 Pain, female pelvic 12/01/2010    Past Surgical History:  Procedure Laterality Date  . CHOLECYSTECTOMY    . HERNIA REPAIR    . HYSTERECTOMY ABDOMINAL  WITH SALPINGECTOMY Left 02/07/2016   Procedure: HYSTERECTOMY ABDOMINAL WITH  left SALPINGECTOMY;  Surgeon: Mora Bellman, MD;  Location: [facility];  Service: Gynecology;  Laterality: Left;  . LAPAROSCOPY  05/16/2011   Procedure: LAPAROSCOPY OPERATIVE;  Surgeon: Emeterio Reeve, MD;  Location: [facility];  Service: Gynecology;  Laterality: N/A;  poss oophorectomy  . LAPAROSCOPY  08/06/2011   Procedure: LAPAROSCOPY OPERATIVE;  Surgeon: Woodroe Mode, MD;  Location: [facility];  Service: Gynecology;  Laterality: N/A;  . LAPAROTOMY     for endometriosis/adhesions  . SALPINGOOPHORECTOMY  08/06/2011   Procedure: SALPINGO OOPHERECTOMY;  Surgeon: Woodroe Mode, MD;  Location: [facility];  Service: Gynecology;  Laterality: Right;  . SVD     x 2  . TUBAL LIGATION    . WISDOM TOOTH EXTRACTION       OB History    Gravida  2   Para  2   Term  2   Preterm  0   AB  0   Living  2     SAB  0   TAB  0   Ectopic  0   Multiple  0   Live Births  2            Home Medications    Prior to Admission  medications   Medication Sig Start Date End Date Taking? Authorizing Provider  buPROPion (WELLBUTRIN XL) 150 MG 24 hr tablet Take 150 mg by mouth daily.   Yes [provider]  butalbital-acetaminophen-caffeine (FIORICET, ESGIC) 50-325-40 MG tablet Take 1 tablet by mouth every 8 (eight) hours as needed for migraine.   Yes [provider]  cetirizine (ZYRTEC) 10 MG tablet Take 10 mg by mouth daily as needed for allergies.   Yes [provider]  divalproex (DEPAKOTE) 500 MG DR tablet TAKE 2 TABLETS BY MOUTH TWICE DAILY Patient taking differently: Take 1,000 mg by mouth 2 (two) times daily.  05/02/18  Yes Jaffe, Adam R, DO  eletriptan (RELPAX) 40 MG tablet Take 1 tablet (40 mg total) as needed by mouth for migraine or headache. May repeat in 2 hours if headache persists or recurs. No more than 2 in 24 hours 02/26/17  Yes Jaffe, Adam R, DO  gabapentin (NEURONTIN) 300 MG capsule Take 300  mg by mouth 3 (three) times daily.   Yes [provider]  HYDROcodone-acetaminophen (NORCO/VICODIN) 5-325 MG tablet TK 1 T PO Q 6 H 07/24/16  Yes [provider]  hydrOXYzine (VISTARIL) 25 MG capsule Take 25 mg by mouth 3 (three) times daily as needed for anxiety.   Yes [provider]  meloxicam (MOBIC) 15 MG tablet Take 1 tablet (15 mg total) by mouth daily. 07/31/17  Yes Jaynee Eagles, PA-C  naproxen (NAPROSYN) 500 MG tablet Take 1 tablet (500 mg total) by mouth every 12 (twelve) hours as needed. 11/18/17  Yes Jaffe, Adam R, DO  prochlorperazine (COMPAZINE) 10 MG tablet Take 10 mg by mouth every 6 (six) hours as needed for nausea or vomiting.   Yes [provider]  promethazine (PROMETHEGAN) 50 MG suppository Place 50 mg rectally every 8 (eight) hours as needed for nausea or vomiting.    Yes [provider]  propranolol ER (INDERAL LA) 120 MG 24 hr capsule Take 120 mg by mouth daily.   Yes [provider]  sertraline (ZOLOFT) 100 MG tablet Take 100 mg by mouth daily. 07/25/11  Yes [provider]  simvastatin (ZOCOR) 20 MG tablet TK 1 T PO D 05/14/16  Yes [provider]  tiZANidine (ZANAFLEX) 4 MG tablet Take 4 mg by mouth every 12 (twelve) hours as needed for muscle spasms.    Yes [provider]  triamcinolone (KENALOG) 0.025 % ointment Apply 1 application topically 2 (two) times daily. 01/30/18  Yes Anyanwu, Sallyanne Havers, MD  cyclobenzaprine (FLEXERIL) 5 MG tablet Take 1 tablet (5 mg total) by mouth 3 (three) times daily as needed for muscle spasms. Patient not taking: Reported on 03/26/2018 07/31/17   Jaynee Eagles, PA-C  eletriptan (RELPAX) 40 MG tablet Take 1 tablet (40 mg total) by mouth as needed for migraine or headache. May repeat in 2 hrs if headache persists. No more than 2 tablets/24 hrs. Patient not taking: Reported on 05/07/2018 01/22/18   Pieter Partridge, DO  fluconazole (DIFLUCAN) 150 MG tablet TAKE 1 TABLET BY MOUTH  ONCE; MAY TAKE ADDITIONAL DOSE 3 DAYS LATER IF SYMPTOMS PERSIST Patient not taking: Reported on 05/07/2018 03/29/18   Osborne Oman, MD    Family History Family History  Problem Relation Age of Onset  . Migraines Mother   . Heart disease Father   . Migraines Father   . Migraines Daughter   . Breast cancer Neg Hx     Social History Social History  Tobacco Use  . Smoking status: Never Smoker  . Smokeless tobacco: Never Used  Substance Use Topics  . Alcohol use: No    Alcohol/week: 0.0 standard drinks  . Drug use: No     Allergies   Penicillins; Prednisone; and Zithromax [azithromycin]   Review of Systems Review of Systems  Gastrointestinal: Positive for abdominal pain.  Musculoskeletal: Positive for back pain.  All other systems reviewed and are negative.    Physical Exam Updated Vital Signs BP 126/75   Pulse 84   Temp 98.2 F (36.8 C) (Oral)   Resp 16   Ht 5' 11.5" (1.816 m)   Wt 86.2 kg   LMP 01/13/2016 (Exact Date)   SpO2 100%   BMI 26.13 kg/m   Physical Exam Vitals signs and nursing note reviewed.  Constitutional:      Appearance: She is well-developed.     Comments: Uncomfortable   HENT:     Head: Normocephalic and atraumatic.     Mouth/Throat:     Mouth: Mucous membranes are moist.  Eyes:     Extraocular Movements: Extraocular movements intact.  Cardiovascular:     Rate and Rhythm: Normal rate and regular rhythm.  Pulmonary:     Effort: Pulmonary effort is normal.     Breath sounds: Normal breath sounds.  Abdominal:     General: Bowel sounds are normal.     Palpations: Abdomen is soft.     Comments: Complicated abdominal scars. Mild RLQ tenderness   Musculoskeletal:     Comments: Mild lower lumbar tenderness, no obvious deformity. Abrasions R hand and R knee, no obvious bony deformity   Skin:    General: Skin is warm.  Neurological:     General: No focal deficit present.     Mental Status: She is alert.  Psychiatric:        Mood  and Affect: Mood normal.        Behavior: Behavior normal.      ED Treatments / Results  Labs (all labs ordered are listed, but only abnormal results are displayed) Labs Reviewed  COMPREHENSIVE METABOLIC PANEL - Abnormal; Notable for the following components:      Result Value   Potassium 3.4 (*)    CO2 21 (*)    Glucose, Bld 103 (*)    BUN 5 (*)    All other components within normal limits  CBC WITH DIFFERENTIAL/PLATELET  URINALYSIS, ROUTINE W REFLEX MICROSCOPIC  PREGNANCY, URINE  I-STAT BETA HCG BLOOD, ED ([facility], [facility], [facility] ONLY)    EKG None  Radiology Ct Abdomen Pelvis W Contrast  Result Date: 05/07/2018 CLINICAL DATA:  48 year old female with abdominal pain. Concern for acute appendicitis. Also history of recent fall. EXAM: CT ABDOMEN AND PELVIS WITH CONTRAST TECHNIQUE: Multidetector CT imaging of the abdomen and pelvis was performed using the standard protocol following bolus administration of intravenous contrast. CONTRAST:  19mL OMNIPAQUE IOHEXOL 300 MG/ML  SOLN COMPARISON:  Lumbar spine CT dated 05/07/2018 FINDINGS: Lower chest: The visualized lung bases are clear. No intra-abdominal free air or free fluid. Hepatobiliary: The liver is unremarkable. There is mild intrahepatic biliary ductal dilatation. Cholecystectomy. Pancreas: Unremarkable. No pancreatic ductal dilatation or surrounding inflammatory changes. Spleen: Normal in size without focal abnormality. Adrenals/Urinary Tract: Adrenal glands are unremarkable. Kidneys are normal, without renal calculi, focal lesion, or hydronephrosis. Bladder is unremarkable. Stomach/Bowel: There is sigmoid diverticulosis and scattered colonic diverticula without active inflammatory changes. There is no bowel obstruction or active inflammation. There is abutment of  multiple loops of small bowel to the ventral hernia repair mesh compatible with adhesions. The appendix is normal. Vascular/Lymphatic: The abdominal aorta and IVC appear unremarkable. No  portal venous gas. There is no adenopathy. Reproductive: Hysterectomy. The left ovary is grossly unremarkable. There is a 1.5 cm dominant follicle or corpus luteum in the left ovary. The right ovary is not visualized. Other: Ventral hernia repair mesh. Musculoskeletal: There is moderate bilateral hip arthritic changes. No acute osseous pathology. IMPRESSION: 1. No acute intra-abdominal or pelvic pathology. 2. Colonic diverticulosis. No bowel obstruction or active inflammation. Normal appendix. 3. Moderate bilateral hip arthritic changes. Electronically Signed   By: Anner Crete M.D.   On: 05/07/2018 22:49   Ct L-spine No Charge  Result Date: 05/07/2018 CLINICAL DATA:  48 year old female with recent fall and trauma to the back. EXAM: CT LUMBAR SPINE WITHOUT CONTRAST TECHNIQUE: Multidetector CT imaging of the lumbar spine was performed without intravenous contrast administration. Multiplanar CT image reconstructions were also generated. COMPARISON:  CT of the abdomen pelvis dated 05/07/2018 and lumbar spine dated 12/20/2015 FINDINGS: Segmentation: 5 lumbar type vertebrae. Alignment: Normal. Vertebrae: No acute fracture or focal pathologic process. Paraspinal and other soft tissues: Negative. Disc levels: No acute findings.  Mild multilevel facet arthropathy. IMPRESSION: No acute/traumatic lumbar spine pathology. Electronically Signed   By: Anner Crete M.D.   On: 05/07/2018 22:51    Procedures Procedures (including critical care time)  Medications Ordered in ED Medications  morphine 4 MG/ML injection 4 mg (4 mg Intravenous Given 05/07/18 2057)  iohexol (OMNIPAQUE) 300 MG/ML solution 100 mL (100 mLs Intravenous Contrast Given 05/07/18 2201)     Initial Impression / Assessment and Plan / ED Course  I have reviewed the triage vital signs and the nursing notes.  Pertinent labs & imaging results that were available during my care of the patient were reviewed by me and considered in my medical  decision making (see chart for details).     ELANI DELPH is a 48 y.o. female here with fall. Fell yesterday and denies any syncope or head injury. She has abrasions on R knee and hand but able to ambulate and has no bony tenderness. Has RLQ tenderness, consider abdominal bruising.   11:14 PM Labs and UA unremarkable. CT unremarkable. Likely abdominal contusion. Stable for discharge.    Final Clinical Impressions(s) / ED Diagnoses   Final diagnoses:  Back pain    ED Discharge Orders    None       Drenda Freeze, MD 05/07/18 2316

## 2018-05-07 NOTE — ED Notes (Signed)
Pt. Refused wheelchair, ambulates with cane.

## 2018-05-07 NOTE — ED Triage Notes (Signed)
Pt fall last night and has some abrasion on her right hand/fingers. She also complains of right knee, leg and arm pain.  She states she has a bruise on her stomach.

## 2018-05-07 NOTE — ED Notes (Signed)
Pt returned from c-t  Pain a little better

## 2018-05-07 NOTE — ED Notes (Signed)
Patient transported to CT 

## 2018-05-07 NOTE — ED Triage Notes (Signed)
Pt. Was sent here from [facility] when she pressed on my stomach it hurt, making sure I don't have something wrong with my appendix.

## 2018-05-07 NOTE — ED Triage Notes (Signed)
Pt. Stated, I tripped and fell. I hurt my rt. Hand, knee, and my back.

## 2018-05-07 NOTE — ED Notes (Signed)
Pt ambulated to bathroom unassisted.

## 2018-05-07 NOTE — ED Provider Notes (Signed)
[facility]   [id number] 05/07/18 Arrival Time: 9390  CC: Fall  SUBJECTIVE: History from: patient. Christine Vega is a 48 y.o. female who presents with complaint of abdominal pain, RT finger abrasions, right knee pain, neck pain, and low back pain that began yesterday after she fell down approximately 5 concrete steps.  States she fell forward, but ended up on landing on her back.  Does not recall hitting head.  Denies LOC and was ambulatory after the fall. Complains of associated lightheadedness, nausea and possible bruising over her abdomen.  Denies sensation changes, motor weakness, neurological impairment, amaurosis, diplopia, dysphasia, severe HA, loss of balance, chest pain, SOB, flank pain, changes in bowel or bladder habits   ROS: As per HPI.  Past Medical History:  Diagnosis Date  . Anemia    history   . Anxiety   . Back pain    rotates to left hip and tailbone  . Depression   . DUB (dysfunctional uterine bleeding) 12/01/2010  . Endometriosis   . Fractured coccyx ([place])   . Heart murmur    no problems  . History of seasonal allergies   . Hyperlipidemia   . Migraine   . Migraines    tx w/depakote/verapamil per pt  . Murmur, heart   . 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 Post traumatic stress disorder (PTSD)   . Seizures ([place]) 2004   x 1; unknown source  . Uterine leiomyoma    Past Surgical History:  Procedure Laterality Date  . CHOLECYSTECTOMY    . HERNIA REPAIR    . HYSTERECTOMY ABDOMINAL WITH SALPINGECTOMY Left 02/07/2016   Procedure: HYSTERECTOMY ABDOMINAL WITH  left SALPINGECTOMY;  Surgeon: Mora Bellman, MD;  Location: [facility];  Service: Gynecology;  Laterality: Left;  . LAPAROSCOPY  05/16/2011   Procedure: LAPAROSCOPY OPERATIVE;  Surgeon: Emeterio Reeve, MD;  Location: [facility];  Service: Gynecology;  Laterality: N/A;  poss oophorectomy  . LAPAROSCOPY  08/06/2011   Procedure: LAPAROSCOPY OPERATIVE;  Surgeon:  Woodroe Mode, MD;  Location: [facility];  Service: Gynecology;  Laterality: N/A;  . LAPAROTOMY     for endometriosis/adhesions  . SALPINGOOPHORECTOMY  08/06/2011   Procedure: SALPINGO OOPHERECTOMY;  Surgeon: Woodroe Mode, MD;  Location: [facility];  Service: Gynecology;  Laterality: Right;  . SVD     x 2  . TUBAL LIGATION    . WISDOM TOOTH EXTRACTION     Allergies  Allergen Reactions  . Penicillins Hives, Itching and Other (See Comments)    Has patient had a PCN reaction causing immediate rash, facial/tongue/throat swelling, SOB or lightheadedness with hypotension: No Has patient had a PCN reaction causing severe rash involving mucus membranes or skin necrosis: No Has patient had a PCN reaction that required hospitalization: No Has patient had a PCN reaction occurring within the last 10 years: Yes If all of the above answers are "NO", then may proceed with Cephalosporin use.   . Prednisone Hives and Itching  . Zithromax [Azithromycin] Hives and Itching   No current facility-administered medications on file prior to encounter.    Current Outpatient Medications on File Prior to Encounter  Medication Sig Dispense Refill  . buPROPion (WELLBUTRIN XL) 150 MG 24 hr tablet Take 150 mg by mouth daily.    . butalbital-acetaminophen-caffeine (FIORICET, ESGIC) 50-325-40 MG tablet Take 1 tablet by mouth every 8 (eight) hours as needed for migraine.    . cetirizine (ZYRTEC) 10 MG tablet  Take 10 mg by mouth daily as needed for allergies.    . cyclobenzaprine (FLEXERIL) 5 MG tablet Take 1 tablet (5 mg total) by mouth 3 (three) times daily as needed for muscle spasms. (Patient not taking: Reported on 03/26/2018) 30 tablet 0  . divalproex (DEPAKOTE) 500 MG DR tablet TAKE 2 TABLETS BY MOUTH TWICE DAILY 120 tablet 2  . eletriptan (RELPAX) 40 MG tablet Take 1 tablet (40 mg total) as needed by mouth for migraine or headache. May repeat in 2 hours if headache persists or recurs. No more than 2 in 24 hours 10  tablet 3  . eletriptan (RELPAX) 40 MG tablet Take 1 tablet (40 mg total) by mouth as needed for migraine or headache. May repeat in 2 hrs if headache persists. No more than 2 tablets/24 hrs. 10 tablet 3  . fluconazole (DIFLUCAN) 150 MG tablet TAKE 1 TABLET BY MOUTH ONCE; MAY TAKE ADDITIONAL DOSE 3 DAYS LATER IF SYMPTOMS PERSIST 1 tablet 2  . gabapentin (NEURONTIN) 300 MG capsule Take 300 mg by mouth 3 (three) times daily.    Marland Kitchen HYDROcodone-acetaminophen (NORCO/VICODIN) 5-325 MG tablet TK 1 T PO Q 6 H  0  . hydrOXYzine (VISTARIL) 25 MG capsule Take 25 mg by mouth 3 (three) times daily as needed for anxiety.    . meloxicam (MOBIC) 15 MG tablet Take 1 tablet (15 mg total) by mouth daily. 30 tablet 1  . naproxen (NAPROSYN) 500 MG tablet Take 1 tablet (500 mg total) by mouth every 12 (twelve) hours as needed. 16 tablet 2  . prochlorperazine (COMPAZINE) 10 MG tablet Take 10 mg by mouth every 6 (six) hours as needed for nausea or vomiting.    . promethazine (PROMETHEGAN) 50 MG suppository Place 50 mg rectally every 8 (eight) hours as needed for nausea or vomiting.     . propranolol ER (INDERAL LA) 120 MG 24 hr capsule Take 120 mg by mouth daily.    . sertraline (ZOLOFT) 100 MG tablet Take 100 mg by mouth daily.    . simvastatin (ZOCOR) 20 MG tablet TK 1 T PO D  1  . tiZANidine (ZANAFLEX) 4 MG tablet Take 4 mg by mouth every 12 (twelve) hours as needed for muscle spasms.     Marland Kitchen triamcinolone (KENALOG) 0.025 % ointment Apply 1 application topically 2 (two) times daily. (Patient not taking: Reported on 03/05/2018) 80 g 2   Social History   Socioeconomic History  . Marital status: Legally Separated    Spouse name: Not on file  . Number of children: Not on file  . Years of education: Not on file  . Highest education level: Not on file  Occupational History  . Not on file  Social Needs  . Financial resource strain: Not on file  . Food insecurity:    Worry: Not on file    Inability: Not on file  .  Transportation needs:    Medical: Not on file    Non-medical: Not on file  Tobacco Use  . Smoking status: Never Smoker  . Smokeless tobacco: Never Used  Substance and Sexual Activity  . Alcohol use: No    Alcohol/week: 0.0 standard drinks  . Drug use: No  . Sexual activity: Not Currently    Birth control/protection: Surgical  Lifestyle  . Physical activity:    Days per week: Not on file    Minutes per session: Not on file  . Stress: Not on file  Relationships  . Social connections:  Talks on phone: Not on file    Gets together: Not on file    Attends religious service: Not on file    Active member of club or organization: Not on file    Attends meetings of clubs or organizations: Not on file    Relationship status: Not on file  . Intimate partner violence:    Fear of current or ex partner: Not on file    Emotionally abused: Not on file    Physically abused: Not on file    Forced sexual activity: Not on file  Other Topics Concern  . Not on file  Social History Narrative  . Not on file   Family History  Problem Relation Age of Onset  . Migraines Mother   . Heart disease Father   . Migraines Father   . Migraines Daughter   . Breast cancer Neg Hx     OBJECTIVE:  Vitals:   05/07/18 1405  BP: 117/60  Pulse: 90  Resp: 18  Temp: 100.3 F (37.9 C)  TempSrc: Oral  SpO2: 97%     Glascow Coma Scale: 15  General appearance: AOx3; appears uncomfortable HEENT: normocephalic; atraumatic; PERRL; EOMI grossly; EAC clear; Nose without rhinorrhea; oropharynx clear, dentition intact Neck: supple with FROM; no midline tenderness; does have tenderness of cervical musculature extending over trapezius distribution only on the left Lungs: clear to auscultation bilaterally Heart: regular rate and rhythm Abdomen: soft, multiple post surgical scars; BS normal active; diffusely tender to palpation over RLQ; +guarding, +rebound; no obvious bruising Back: no midline tenderness;  mildly tender over low back Extremities: RT anterior knee diffusely tender with small abrasion; RT 3rd-5th PIP joints with small abrasions without active bleeding Skin: warm and dry Neurologic: CN 2-12 grossly intact; ambulates with mild antalgic gait with cane;  strength and sensation intact and symmetrical about the upper and lower extremities Psychological: alert and cooperative; normal mood and affect  ASSESSMENT & PLAN:  1. Fall, initial encounter   2. RLQ abdominal pain   3. Acute pain of right knee   4. Neck pain   5. Acute low back pain without sciatica, unspecified back pain laterality   6. Abrasion of finger, initial encounter    Recommending further evaluation and management in the ED for RLQ pain and multiple injuries following a fall last night  Reviewed expectations re: course of current medical issues. Questions answered. Outlined signs and symptoms indicating need for more acute intervention. Patient verbalized understanding. After Visit Summary given.        Lestine Box, PA-C 05/07/18 1627

## 2018-05-07 NOTE — Discharge Instructions (Signed)
Take motrin or tylenol for pain.   Take flexeril for muscle spasms. You likely had some mild abdominal wall bruising from the fall.   See your doctor  Return to ER if you have worse abdominal pain, back pain, vomiting.

## 2018-05-07 NOTE — Discharge Instructions (Addendum)
Recommending further evaluation and management in the ED for RLQ pain and multiple injuries following a fall last night

## 2018-05-22 ENCOUNTER — Ambulatory Visit: Payer: Medicaid Other | Admitting: Neurology

## 2018-05-22 NOTE — Progress Notes (Deleted)
NEUROLOGY FOLLOW UP OFFICE NOTE  Christine Vega [id number]  HISTORY OF PRESENT ILLNESS: Christine Vega is a 48 year old African-American woman with migraines, depression and chronic pain who follows up for migraines.  UPDATE: Intensity:  *** Duration:  *** Frequency:  *** Frequency of abortive medication: *** Current NSAIDS:  no Current analgesics:  Hydrocodone for back pain Current triptans/ergot:  Relpax 40mg  Current anti-emetic:  promethazine 50mg  PR Current muscle relaxants:  tizanidine 4mg  Current anti-anxiolytic:  no Current sleep aide:  no Current Antihypertensive medications:  Propranolol ER 120mg  Current Antidepressant medications:  bupropion, sertraline 150mg  Current Anticonvulsant medications:  divalproex 500mg  24r twice daily, gabapentin 300mg  three times daily Current Vitamins/Herbal/Supplements:  D, E Current Antihistamines/Decongestants:  hydroxyzine Other therapy:  Botox (status post 2 rounds)   Caffeine:  Rarely coffee, tea, soda Alcohol:  no Smoker:  no Diet:  Hydrates Exercise:  yes Depression/anxiety:  stable Sleep hygiene:  poor  HISTORY:  Onset: 48 years old Location:  Either temple Quality:  Throbbing, pressure Initial Intensity:  10/10 Aura:  no Prodrome:  no Associated symptoms: Nausea, vomiting, photophobia, phonophobia, osmophobia, blurred vision Initial Duration:  3 days  Initial Frequency:  16 headache days a month Triggers: Certain smells, chocolate , garlic Relieving factors:  no Activity:  Cannot function  Past NSAIDS:  Ibuprofen, naproxen Past analgesics:  Tylenol, oxycodone, Excedrin, Fioricet Past abortive triptans/ergot:  Sumatriptan (tablet and injection), Maxalt, Zomig NS, Migranal NS, Relpax Past muscle relaxants:  Flexeril Past anti-emetic:  promethazine Past anti-anxiolytic:  lorazepam Past antihypertensive medications:  verapamil Past antidepressant medications:  nortriptyline Past anticonvulsant medications:   Topiramate, Lyrica Past vitamins/Herbal/Supplements:  No Other therapy:  no  Family history of headache:  Mother, daughters  CT of head and cervical spine from 12/20/15 were personally reviewed and were negative for acute changes.  Cervical spine did demonstrate degenerative disc disease at C5-6 and C6-7.  PAST MEDICAL HISTORY: Past Medical History:  Diagnosis Date  . Anemia    history   . Anxiety   . Back pain    rotates to left hip and tailbone  . Depression   . DUB (dysfunctional uterine bleeding) 12/01/2010  . Endometriosis   . Fractured coc[place]x ([place])   . Heart murmur    no problems  . History of seasonal allergies   . Hyperlipidemia   . Migraine   . Migraines    tx w/depakote/verapamil per pt  . Murmur, heart   . 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 Post traumatic stress disorder (PTSD)   . Seizures ([place]) 2004   x 1; unknown source  . Uterine leiomyoma     MEDICATIONS: Current Outpatient Medications on File Prior to Visit  Medication Sig Dispense Refill  . buPROPion (WELLBUTRIN XL) 150 MG 24 hr tablet Take 150 mg by mouth daily.    . butalbital-acetaminophen-caffeine (FIORICET, ESGIC) 50-325-40 MG tablet Take 1 tablet by mouth every 8 (eight) hours as needed for migraine.    . cetirizine (ZYRTEC) 10 MG tablet Take 10 mg by mouth daily as needed for allergies.    . cyclobenzaprine (FLEXERIL) 5 MG tablet Take 1 tablet (5 mg total) by mouth 3 (three) times daily as needed for muscle spasms. 10 tablet 0  . divalproex (DEPAKOTE) 500 MG DR tablet TAKE 2 TABLETS BY MOUTH TWICE DAILY (Patient taking differently: Take 1,000 mg by mouth 2 (two) times daily. ) 120 tablet 2  . eletriptan (RELPAX) 40 MG tablet  Take 1 tablet (40 mg total) as needed by mouth for migraine or headache. May repeat in 2 hours if headache persists or recurs. No more than 2 in 24 hours 10 tablet 3  . eletriptan (RELPAX) 40 MG tablet Take 1 tablet (40 mg total) by  mouth as needed for migraine or headache. May repeat in 2 hrs if headache persists. No more than 2 tablets/24 hrs. (Patient not taking: Reported on 05/07/2018) 10 tablet 3  . fluconazole (DIFLUCAN) 150 MG tablet TAKE 1 TABLET BY MOUTH ONCE; MAY TAKE ADDITIONAL DOSE 3 DAYS LATER IF SYMPTOMS PERSIST (Patient not taking: Reported on 05/07/2018) 1 tablet 2  . gabapentin (NEURONTIN) 300 MG capsule Take 300 mg by mouth 3 (three) times daily.    Marland Kitchen HYDROcodone-acetaminophen (NORCO/VICODIN) 5-325 MG tablet TK 1 T PO Q 6 H  0  . hydrOXYzine (VISTARIL) 25 MG capsule Take 25 mg by mouth 3 (three) times daily as needed for anxiety.    . meloxicam (MOBIC) 15 MG tablet Take 1 tablet (15 mg total) by mouth daily. 30 tablet 1  . naproxen (NAPROSYN) 500 MG tablet Take 1 tablet (500 mg total) by mouth every 12 (twelve) hours as needed. 16 tablet 2  . prochlorperazine (COMPAZINE) 10 MG tablet Take 10 mg by mouth every 6 (six) hours as needed for nausea or vomiting.    . promethazine (PROMETHEGAN) 50 MG suppository Place 50 mg rectally every 8 (eight) hours as needed for nausea or vomiting.     . propranolol ER (INDERAL LA) 120 MG 24 hr capsule Take 120 mg by mouth daily.    . sertraline (ZOLOFT) 100 MG tablet Take 100 mg by mouth daily.    . simvastatin (ZOCOR) 20 MG tablet TK 1 T PO D  1  . tiZANidine (ZANAFLEX) 4 MG tablet Take 4 mg by mouth every 12 (twelve) hours as needed for muscle spasms.     Marland Kitchen triamcinolone (KENALOG) 0.025 % ointment Apply 1 application topically 2 (two) times daily. 80 g 2   No current facility-administered medications on file prior to visit.     ALLERGIES: Allergies  Allergen Reactions  . Penicillins Hives, Itching and Other (See Comments)    Has patient had a PCN reaction causing immediate rash, facial/tongue/throat swelling, SOB or lightheadedness with hypotension: No Has patient had a PCN reaction causing severe rash involving mucus membranes or skin necrosis: No Has patient had a PCN  reaction that required hospitalization: No Has patient had a PCN reaction occurring within the last 10 years: Yes If all of the above answers are "NO", then may proceed with Cephalosporin use.   . Prednisone Hives and Itching  . Zithromax [Azithromycin] Hives and Itching    FAMILY HISTORY: Family History  Problem Relation Age of Onset  . Migraines Mother   . Heart disease Father   . Migraines Father   . Migraines Daughter   . Breast cancer Neg Hx    SOCIAL HISTORY: Social History   Socioeconomic History  . Marital status: Legally Separated    Spouse name: Not on file  . Number of children: Not on file  . Years of education: Not on file  . Highest education level: Not on file  Occupational History  . Not on file  Social Needs  . Financial resource strain: Not on file  . Food insecurity:    Worry: Not on file    Inability: Not on file  . Transportation needs:    Medical: Not  on file    Non-medical: Not on file  Tobacco Use  . Smoking status: Never Smoker  . Smokeless tobacco: Never Used  Substance and Sexual Activity  . Alcohol use: No    Alcohol/week: 0.0 standard drinks  . Drug use: No  . Sexual activity: Not Currently    Birth control/protection: Surgical  Lifestyle  . Physical activity:    Days per week: Not on file    Minutes per session: Not on file  . Stress: Not on file  Relationships  . Social connections:    Talks on phone: Not on file    Gets together: Not on file    Attends religious service: Not on file    Active member of club or organization: Not on file    Attends meetings of clubs or organizations: Not on file    Relationship status: Not on file  . Intimate partner violence:    Fear of current or ex partner: Not on file    Emotionally abused: Not on file    Physically abused: Not on file    Forced sexual activity: Not on file  Other Topics Concern  . Not on file  Social History Narrative  . Not on file    REVIEW OF  SYSTEMS: Constitutional: No fevers, chills, or sweats, no generalized fatigue, change in appetite Eyes: No visual changes, double vision, eye pain Ear, nose and throat: No hearing loss, ear pain, nasal congestion, sore throat Cardiovascular: No chest pain, palpitations Respiratory:  No shortness of breath at rest or with exertion, wheezes GastrointestinaI: No nausea, vomiting, diarrhea, abdominal pain, fecal incontinence Genitourinary:  No dysuria, urinary retention or frequency Musculoskeletal:  No neck pain, back pain Integumentary: No rash, pruritus, skin lesions Neurological: as above Psychiatric: No depression, insomnia, anxiety Endocrine: No palpitations, fatigue, diaphoresis, mood swings, change in appetite, change in weight, increased thirst Hematologic/Lymphatic:  No purpura, petechiae. Allergic/Immunologic: no itchy/runny eyes, nasal congestion, recent allergic reactions, rashes  PHYSICAL EXAM: *** General: No acute distress.  Patient appears ***-groomed.  *** body habitus. Head:  Normocephalic/atraumatic Eyes:  Fundi examined but not visualized Neck: supple, no paraspinal tenderness, full range of motion Heart:  Regular rate and rhythm Lungs:  Clear to auscultation bilaterally Back: No paraspinal tenderness Neurological Exam: alert and oriented to person, place, and time. Attention span and concentration intact, recent and remote memory intact, fund of knowledge intact.  Speech fluent and not dysarthric, language intact.  CN II-XII intact. Bulk and tone normal, muscle strength 5/5 throughout.  Sensation to light touch  intact.  Deep tendon reflexes 2+ throughout.  Finger to nose testing intact.  Gait normal, Romberg negative.  IMPRESSION: ***  PLAN: 1.  For preventative management, *** 2.  For abortive therapy, *** 3.  Limit use of pain relievers to no more than 2 days out of week to prevent risk of rebound or medication-overuse headache. 4.  Keep headache diary 5.   Exercise, hydration, caffeine cessation, sleep hygiene, monitor for and avoid triggers 6.  Consider:  magnesium citrate 400mg  daily, riboflavin 400mg  daily, and coenzyme Q10 100mg  three times daily 7.  Follow up ***  Metta Clines, DO  CC: Gala Romney, MD

## 2018-06-12 ENCOUNTER — Encounter: Payer: Self-pay | Admitting: Neurology

## 2018-06-12 ENCOUNTER — Ambulatory Visit: Payer: Medicaid Other | Admitting: Neurology

## 2018-06-12 DIAGNOSIS — G43709 Chronic migraine without aura, not intractable, without status migrainosus: Secondary | ICD-10-CM

## 2018-06-12 MED ORDER — ONABOTULINUMTOXINA 100 UNITS IJ SOLR
200.0000 [IU] | Freq: Once | INTRAMUSCULAR | Status: AC
  Administered 2018-06-12: 200 [IU] via INTRAMUSCULAR

## 2018-06-12 NOTE — Progress Notes (Signed)
[facility]   Procedure Note Botox  Attending: Dr. Tomi Likens  Preoperative Diagnosis(es): Chronic migraine  Consent obtained from: The patient Benefits discussed included, but were not limited to decreased muscle tightness, increased joint range of motion, and decreased pain.  Risk discussed included, but were not limited pain and discomfort, bleeding, bruising, excessive weakness, venous thrombosis, muscle atrophy and dysphagia.  Anticipated outcomes of the procedure as well as he risks and benefits of the alternatives to the procedure, and the roles and tasks of the personnel to be involved, were discussed with the patient, and the patient consents to the procedure and agrees to proceed. A copy of the patient medication guide was given to the patient which explains the blackbox warning.  Patients identity and treatment sites confirmed Yes.  .  Details of Procedure: Skin was cleaned with alcohol. Prior to injection, the needle plunger was aspirated to make sure the needle was not within a blood vessel.  There was no blood retrieved on aspiration.    Following is a summary of the muscles injected  And the amount of Botulinum toxin used:  Dilution 200 units of Botox was reconstituted with 4 ml of preservative free normal saline. Time of reconstitution: At the time of the office visit (<30 minutes prior to injection)   Injections  155 total units of Botox was injected with a 30 gauge needle.  Injection Sites: L occipitalis: 15 units- 3 sites  R occiptalis: 15 units- 3 sites  L upper trapezius: 15 units- 3 sites R upper trapezius: 15 units- 3 sits          L paraspinal: 10 units- 2 sites R paraspinal: 10 units- 2 sites  Face L frontalis(2 injection sites):10 units   R frontalis(2 injection sites):10 units         L corrugator: 5 units   R corrugator: 5 units           Procerus: 5 units   L temporalis: 20 units R temporalis: 20 units   Agent:  200 units of botulinum Type A  (Onobotulinum Toxin type A) was reconstituted with 4 ml of preservative free normal saline.  Time of reconstitution: At the time of the office visit (<30 minutes prior to injection)     Total injected (Units): 155  Total wasted (Units): 45  Patient tolerated procedure well without complications.   Reinjection is anticipated in 3 months. Return to clinic in about 2 weeks

## 2018-06-20 NOTE — Progress Notes (Deleted)
NEUROLOGY FOLLOW UP OFFICE NOTE  Christine Vega [id number]  HISTORY OF PRESENT ILLNESS: Christine Vega is a 48 year old woman with migraines and chronic pain who follows up for migraines.  UPDATE: Intensity:  severe Duration:  4-5 hours Frequency:  11 days a month Frequency of abortive medication: No more than 2 days a week Current NSAIDS:  no Current analgesics:  Hydrocodone for back pain Current triptans/ergot:   Relpax 40 mg Current anti-emetic:  promethazine 50mg  PR Current muscle relaxants:  tizanidine 4mg  Current anti-anxiolytic:  no Current sleep aide:  no Current Antihypertensive medications:  Propranolol ER 120mg  Current Antidepressant medications:  bupropion, sertraline 150mg  Current Anticonvulsant medications:  divalproex 500mg  24r twice daily, gabapentin 300mg  three times daily Current Vitamins/Herbal/Supplements:  D, E Current Antihistamines/Decongestants:  hydroxyzine Other therapy:  Botox (status post 2 rounds)   Caffeine:  Rarely coffee, tea, soda Alcohol:  no Smoker:  no Diet:  Hydrates Exercise:  yes Depression/anxiety:  stable Sleep hygiene:  poor   HISTORY: Onset: 48 years old Location:  Either temple Quality:  Throbbing, pressure Initial Intensity:  10/10 Aura:  no Prodrome:  no Associated symptoms: Nausea, vomiting, photophobia, phonophobia, osmophobia, blurred vision Initial Duration:  3 days  Initial Frequency:  16 headache days a month Triggers: Certain smells, chocolate , garlic Relieving factors: None Activity:  Cannot function  Past NSAIDS:  Ibuprofen, naproxen Past analgesics:  Tylenol, oxycodone, Excedrin, Fioricet Past abortive triptans/ergot:  Sumatriptan (tablet and injection), Maxalt, Zomig NS, Migranal NS, Relpax Past muscle relaxants:  Flexeril Past anti-emetic:  promethazine Past anti-anxiolytic:  lorazepam Past antihypertensive medications:  verapamil Past antidepressant medications:  nortriptyline Past  anticonvulsant medications:  Topiramate, Lyrica Past vitamins/Herbal/Supplements:  No Other therapy:  no  Family history of headache:  Mother, daughters  CT of head and cervical spine from 12/20/15 were personally reviewed and were negative for acute changes.  Cervical spine did demonstrate degenerative disc disease at C5-6 and C6-7.  PAST MEDICAL HISTORY: Past Medical History:  Diagnosis Date  . Anemia    history   . Anxiety   . Back pain    rotates to left hip and tailbone  . Depression   . DUB (dysfunctional uterine bleeding) 12/01/2010  . Endometriosis   . Fractured coccyx ([place])   . Heart murmur    no problems  . History of seasonal allergies   . Hyperlipidemia   . Migraine   . Migraines    tx w/depakote/verapamil per pt  . Murmur, heart   . 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 Post traumatic stress disorder (PTSD)   . Seizures ([place]) 2004   x 1; unknown source  . Uterine leiomyoma     MEDICATIONS: Current Outpatient Medications on File Prior to Visit  Medication Sig Dispense Refill  . buPROPion (WELLBUTRIN XL) 150 MG 24 hr tablet Take 150 mg by mouth daily.    . butalbital-acetaminophen-caffeine (FIORICET, ESGIC) 50-325-40 MG tablet Take 1 tablet by mouth every 8 (eight) hours as needed for migraine.    . cetirizine (ZYRTEC) 10 MG tablet Take 10 mg by mouth daily as needed for allergies.    . cyclobenzaprine (FLEXERIL) 5 MG tablet Take 1 tablet (5 mg total) by mouth 3 (three) times daily as needed for muscle spasms. 10 tablet 0  . divalproex (DEPAKOTE) 500 MG DR tablet TAKE 2 TABLETS BY MOUTH TWICE DAILY (Patient taking differently: Take 1,000 mg by mouth 2 (two) times daily. )  120 tablet 2  . eletriptan (RELPAX) 40 MG tablet Take 1 tablet (40 mg total) as needed by mouth for migraine or headache. May repeat in 2 hours if headache persists or recurs. No more than 2 in 24 hours 10 tablet 3  . eletriptan (RELPAX) 40 MG tablet Take  1 tablet (40 mg total) by mouth as needed for migraine or headache. May repeat in 2 hrs if headache persists. No more than 2 tablets/24 hrs. (Patient not taking: Reported on 05/07/2018) 10 tablet 3  . fluconazole (DIFLUCAN) 150 MG tablet TAKE 1 TABLET BY MOUTH ONCE; MAY TAKE ADDITIONAL DOSE 3 DAYS LATER IF SYMPTOMS PERSIST (Patient not taking: Reported on 05/07/2018) 1 tablet 2  . gabapentin (NEURONTIN) 300 MG capsule Take 300 mg by mouth 3 (three) times daily.    Marland Kitchen HYDROcodone-acetaminophen (NORCO/VICODIN) 5-325 MG tablet TK 1 T PO Q 6 H  0  . hydrOXYzine (VISTARIL) 25 MG capsule Take 25 mg by mouth 3 (three) times daily as needed for anxiety.    . meloxicam (MOBIC) 15 MG tablet Take 1 tablet (15 mg total) by mouth daily. 30 tablet 1  . naproxen (NAPROSYN) 500 MG tablet Take 1 tablet (500 mg total) by mouth every 12 (twelve) hours as needed. 16 tablet 2  . prochlorperazine (COMPAZINE) 10 MG tablet Take 10 mg by mouth every 6 (six) hours as needed for nausea or vomiting.    . promethazine (PROMETHEGAN) 50 MG suppository Place 50 mg rectally every 8 (eight) hours as needed for nausea or vomiting.     . propranolol ER (INDERAL LA) 120 MG 24 hr capsule Take 120 mg by mouth daily.    . sertraline (ZOLOFT) 100 MG tablet Take 100 mg by mouth daily.    . simvastatin (ZOCOR) 20 MG tablet TK 1 T PO D  1  . tiZANidine (ZANAFLEX) 4 MG tablet Take 4 mg by mouth every 12 (twelve) hours as needed for muscle spasms.     Marland Kitchen triamcinolone (KENALOG) 0.025 % ointment Apply 1 application topically 2 (two) times daily. 80 g 2   No current facility-administered medications on file prior to visit.     ALLERGIES: Allergies  Allergen Reactions  . Penicillins Hives, Itching and Other (See Comments)    Has patient had a PCN reaction causing immediate rash, facial/tongue/throat swelling, SOB or lightheadedness with hypotension: No Has patient had a PCN reaction causing severe rash involving mucus membranes or skin necrosis:  No Has patient had a PCN reaction that required hospitalization: No Has patient had a PCN reaction occurring within the last 10 years: Yes If all of the above answers are "NO", then may proceed with Cephalosporin use.   . Prednisone Hives and Itching  . Zithromax [Azithromycin] Hives and Itching    FAMILY HISTORY: Family History  Problem Relation Age of Onset  . Migraines Mother   . Heart disease Father   . Migraines Father   . Migraines Daughter   . Breast cancer Neg Hx    SOCIAL HISTORY: Social History   Socioeconomic History  . Marital status: Legally Separated    Spouse name: Not on file  . Number of children: Not on file  . Years of education: Not on file  . Highest education level: Not on file  Occupational History  . Not on file  Social Needs  . Financial resource strain: Not on file  . Food insecurity:    Worry: Not on file    Inability: Not on  file  . Transportation needs:    Medical: Not on file    Non-medical: Not on file  Tobacco Use  . Smoking status: Never Smoker  . Smokeless tobacco: Never Used  Substance and Sexual Activity  . Alcohol use: No    Alcohol/week: 0.0 standard drinks  . Drug use: No  . Sexual activity: Not Currently    Birth control/protection: Surgical  Lifestyle  . Physical activity:    Days per week: Not on file    Minutes per session: Not on file  . Stress: Not on file  Relationships  . Social connections:    Talks on phone: Not on file    Gets together: Not on file    Attends religious service: Not on file    Active member of club or organization: Not on file    Attends meetings of clubs or organizations: Not on file    Relationship status: Not on file  . Intimate partner violence:    Fear of current or ex partner: Not on file    Emotionally abused: Not on file    Physically abused: Not on file    Forced sexual activity: Not on file  Other Topics Concern  . Not on file  Social History Narrative  . Not on file     REVIEW OF SYSTEMS: Constitutional: No fevers, chills, or sweats, no generalized fatigue, change in appetite Eyes: No visual changes, double vision, eye pain Ear, nose and throat: No hearing loss, ear pain, nasal congestion, sore throat Cardiovascular: No chest pain, palpitations Respiratory:  No shortness of breath at rest or with exertion, wheezes GastrointestinaI: No nausea, vomiting, diarrhea, abdominal pain, fecal incontinence Genitourinary:  No dysuria, urinary retention or frequency Musculoskeletal:  No neck pain, back pain Integumentary: No rash, pruritus, skin lesions Neurological: as above Psychiatric: No depression, insomnia, anxiety Endocrine: No palpitations, fatigue, diaphoresis, mood swings, change in appetite, change in weight, increased thirst Hematologic/Lymphatic:  No purpura, petechiae. Allergic/Immunologic: no itchy/runny eyes, nasal congestion, recent allergic reactions, rashes  PHYSICAL EXAM: *** General: No acute distress.  Patient appears ***-groomed.  *** body habitus. Head:  Normocephalic/atraumatic Eyes:  Fundi examined but not visualized Neck: supple, no paraspinal tenderness, full range of motion Heart:  Regular rate and rhythm Lungs:  Clear to auscultation bilaterally Back: No paraspinal tenderness Neurological Exam: alert and oriented to person, place, and time. Attention span and concentration intact, recent and remote memory intact, fund of knowledge intact.  Speech fluent and not dysarthric, language intact.  CN II-XII intact. Bulk and tone normal, muscle strength 5/5 throughout.  Sensation to light touch intact.  Deep tendon reflexes 2+ throughout, toes downgoing.  Finger to nose and heel to shin testing intact.  Gait normal, Romberg negative.  IMPRESSION: *** migraine without aura, without status migrainosus, *** intractable  PLAN: 1.  For preventative management, *** 2.  For abortive therapy, *** 3.  Limit use of pain relievers to no more  than 2 days out of week to prevent risk of rebound or medication-overuse headache. 4.  Keep headache diary 5.  Exercise, hydration, caffeine cessation, sleep hygiene, monitor for and avoid triggers 6.  Consider:  magnesium citrate 400mg  daily, riboflavin 400mg  daily, and coenzyme Q10 100mg  three times daily 7.  Follow up ***  Metta Clines, DO  CC: Gala Romney, MD

## 2018-06-24 ENCOUNTER — Ambulatory Visit: Payer: Medicaid Other | Admitting: Neurology

## 2018-06-25 ENCOUNTER — Other Ambulatory Visit: Payer: Self-pay

## 2018-06-25 ENCOUNTER — Encounter: Payer: Self-pay | Admitting: Neurology

## 2018-06-25 ENCOUNTER — Ambulatory Visit: Payer: Medicaid Other | Admitting: Neurology

## 2018-06-25 VITALS — BP 112/74 | HR 88 | Temp 98.3°F | Ht 71.5 in | Wt 185.0 lb

## 2018-06-25 DIAGNOSIS — G43009 Migraine without aura, not intractable, without status migrainosus: Secondary | ICD-10-CM

## 2018-06-25 MED ORDER — UBROGEPANT 100 MG PO TABS: 100.0000 mg | ORAL_TABLET | ORAL | 3 refills | Status: DC | PRN

## 2018-06-25 NOTE — Progress Notes (Signed)
NEUROLOGY FOLLOW UP OFFICE NOTE  Christine Vega [id number]  HISTORY OF PRESENT ILLNESS: Christine Vega is a 47 year old woman with migraines and chronic pain who follows up for migraines.  UPDATE: Intensity:  moderate to severe Duration:  2 to 3 days but severe only for first 4-5 hours Frequency:  11 days of migraine in past 30 days Frequency of abortive medication: No more than 2 days a week Current NSAIDS:  no Current analgesics:  Hydrocodone for back pain Current triptans/ergot:   Relpax 40 mg Current anti-emetic:  promethazine 50mg  PR Current muscle relaxants:  tizanidine 4mg  Current anti-anxiolytic:  no Current sleep aide:  no Current Antihypertensive medications:  Propranolol ER 120mg  Current Antidepressant medications:  bupropion, sertraline 150mg  Current Anticonvulsant medications:  divalproex 500mg  24r twice daily, gabapentin 300mg  three times daily Current Vitamins/Herbal/Supplements:  D, E Current Antihistamines/Decongestants:  hydroxyzine Other therapy:  Botox (status post 2 rounds)   Caffeine:  Rarely coffee, tea, soda Alcohol:  no Smoker:  no Diet:  Hydrates Exercise:  yes Depression/anxiety:  stable Sleep hygiene:  poor   HISTORY: Onset: 48 years old Location:  Either temple Quality:  Throbbing, pressure Initial Intensity:  10/10 Aura:  no Prodrome:  no Associated symptoms: Nausea, vomiting, photophobia, phonophobia, osmophobia, blurred vision Initial Duration:  3 days  Initial Frequency:  16 headache days a month Triggers: Certain smells, chocolate , garlic Relieving factors: None Activity:  Cannot function  Past NSAIDS:  Ibuprofen, naproxen Past analgesics:  Tylenol, oxycodone, Excedrin, Fioricet Past abortive triptans/ergot:  Sumatriptan (tablet and injection), Maxalt, Zomig NS, Migranal NS, Relpax Past muscle relaxants:  Flexeril Past anti-emetic:  promethazine Past anti-anxiolytic:  lorazepam Past antihypertensive medications:   verapamil Past antidepressant medications:  nortriptyline Past anticonvulsant medications:  Topiramate, Lyrica Past vitamins/Herbal/Supplements:  No Other therapy:  no  Family history of headache:  Mother, daughters  CT of head and cervical spine from 12/20/15 were personally reviewed and were negative for acute changes.  Cervical spine did demonstrate degenerative disc disease at C5-6 and C6-7.  PAST MEDICAL HISTORY: Past Medical History:  Diagnosis Date  . Anemia    history   . Anxiety   . Back pain    rotates to left hip and tailbone  . Depression   . DUB (dysfunctional uterine bleeding) 12/01/2010  . Endometriosis   . Fractured coccyx ([place])   . Heart murmur    no problems  . History of seasonal allergies   . Hyperlipidemia   . Migraine   . Migraines    tx w/depakote/verapamil per pt  . Murmur, heart   . 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 Post traumatic stress disorder (PTSD)   . Seizures ([place]) 2004   x 1; unknown source  . Uterine leiomyoma     MEDICATIONS: Current Outpatient Medications on File Prior to Visit  Medication Sig Dispense Refill  . buPROPion (WELLBUTRIN XL) 150 MG 24 hr tablet Take 150 mg by mouth daily.    . butalbital-acetaminophen-caffeine (FIORICET, ESGIC) 50-325-40 MG tablet Take 1 tablet by mouth every 8 (eight) hours as needed for migraine.    . cetirizine (ZYRTEC) 10 MG tablet Take 10 mg by mouth daily as needed for allergies.    . cyclobenzaprine (FLEXERIL) 5 MG tablet Take 1 tablet (5 mg total) by mouth 3 (three) times daily as needed for muscle spasms. 10 tablet 0  . divalproex (DEPAKOTE) 500 MG DR tablet TAKE 2 TABLETS BY  MOUTH TWICE DAILY (Patient taking differently: Take 1,000 mg by mouth 2 (two) times daily. ) 120 tablet 2  . eletriptan (RELPAX) 40 MG tablet Take 1 tablet (40 mg total) as needed by mouth for migraine or headache. May repeat in 2 hours if headache persists or recurs. No more than 2  in 24 hours 10 tablet 3  . eletriptan (RELPAX) 40 MG tablet Take 1 tablet (40 mg total) by mouth as needed for migraine or headache. May repeat in 2 hrs if headache persists. No more than 2 tablets/24 hrs. (Patient not taking: Reported on 05/07/2018) 10 tablet 3  . gabapentin (NEURONTIN) 300 MG capsule Take 300 mg by mouth 3 (three) times daily.    Marland Kitchen HYDROcodone-acetaminophen (NORCO/VICODIN) 5-325 MG tablet TK 1 T PO Q 6 H  0  . hydrOXYzine (VISTARIL) 25 MG capsule Take 25 mg by mouth 3 (three) times daily as needed for anxiety.    . meloxicam (MOBIC) 15 MG tablet Take 1 tablet (15 mg total) by mouth daily. 30 tablet 1  . naproxen (NAPROSYN) 500 MG tablet Take 1 tablet (500 mg total) by mouth every 12 (twelve) hours as needed. 16 tablet 2  . prochlorperazine (COMPAZINE) 10 MG tablet Take 10 mg by mouth every 6 (six) hours as needed for nausea or vomiting.    . promethazine (PROMETHEGAN) 50 MG suppository Place 50 mg rectally every 8 (eight) hours as needed for nausea or vomiting.     . propranolol ER (INDERAL LA) 120 MG 24 hr capsule Take 120 mg by mouth daily.    . sertraline (ZOLOFT) 100 MG tablet Take 100 mg by mouth daily.    . simvastatin (ZOCOR) 20 MG tablet TK 1 T PO D  1  . tiZANidine (ZANAFLEX) 4 MG tablet Take 4 mg by mouth every 12 (twelve) hours as needed for muscle spasms.     Marland Kitchen triamcinolone (KENALOG) 0.025 % ointment Apply 1 application topically 2 (two) times daily. 80 g 2   No current facility-administered medications on file prior to visit.     ALLERGIES: Allergies  Allergen Reactions  . Penicillins Hives, Itching and Other (See Comments)    Has patient had a PCN reaction causing immediate rash, facial/tongue/throat swelling, SOB or lightheadedness with hypotension: No Has patient had a PCN reaction causing severe rash involving mucus membranes or skin necrosis: No Has patient had a PCN reaction that required hospitalization: No Has patient had a PCN reaction occurring  within the last 10 years: Yes If all of the above answers are "NO", then may proceed with Cephalosporin use.   . Prednisone Hives and Itching  . Lipitor [Atorvastatin Calcium]   . Zithromax [Azithromycin] Hives and Itching    FAMILY HISTORY: Family History  Problem Relation Age of Onset  . Migraines Mother   . Heart disease Father   . Migraines Father   . Migraines Daughter   . Breast cancer Neg Hx    SOCIAL HISTORY: Social History   Socioeconomic History  . Marital status: Legally Separated    Spouse name: Not on file  . Number of children: Not on file  . Years of education: Not on file  . Highest education level: Not on file  Occupational History  . Not on file  Social Needs  . Financial resource strain: Not on file  . Food insecurity:    Worry: Not on file    Inability: Not on file  . Transportation needs:    Medical: Not  on file    Non-medical: Not on file  Tobacco Use  . Smoking status: Never Smoker  . Smokeless tobacco: Never Used  Substance and Sexual Activity  . Alcohol use: No    Alcohol/week: 0.0 standard drinks  . Drug use: No  . Sexual activity: Not Currently    Birth control/protection: Surgical  Lifestyle  . Physical activity:    Days per week: Not on file    Minutes per session: Not on file  . Stress: Not on file  Relationships  . Social connections:    Talks on phone: Not on file    Gets together: Not on file    Attends religious service: Not on file    Active member of club or organization: Not on file    Attends meetings of clubs or organizations: Not on file    Relationship status: Not on file  . Intimate partner violence:    Fear of current or ex partner: Not on file    Emotionally abused: Not on file    Physically abused: Not on file    Forced sexual activity: Not on file  Other Topics Concern  . Not on file  Social History Narrative  . Not on file    REVIEW OF SYSTEMS: Constitutional: No fevers, chills, or sweats, no  generalized fatigue, change in appetite Eyes: No visual changes, double vision, eye pain Ear, nose and throat: No hearing loss, ear pain, nasal congestion, sore throat Cardiovascular: No chest pain, palpitations Respiratory:  No shortness of breath at rest or with exertion, wheezes GastrointestinaI: No nausea, vomiting, diarrhea, abdominal pain, fecal incontinence Genitourinary:  No dysuria, urinary retention or frequency Musculoskeletal:  Back pain Integumentary: No rash, pruritus, skin lesions Neurological: as above Psychiatric: No depression, insomnia, anxiety Endocrine: No palpitations, fatigue, diaphoresis, mood swings, change in appetite, change in weight, increased thirst Hematologic/Lymphatic:  No purpura, petechiae. Allergic/Immunologic: no itchy/runny eyes, nasal congestion, recent allergic reactions, rashes  PHYSICAL EXAM: Blood pressure 112/74, pulse 88, temperature 98.3 F (36.8 C), height 5' 11.5" (1.816 m), weight 185 lb (83.9 kg), last menstrual period 01/13/2016, SpO2 98 %. General: No acute distress.  Patient appears well-groomed. Head:  Normocephalic/atraumatic Eyes:  Fundi examined but not visualized Neck: supple, no paraspinal tenderness, full range of motion Heart:  Regular rate and rhythm Lungs:  Clear to auscultation bilaterally Back: paraspinal tenderness Neurological Exam: alert and oriented to person, place, and time. Attention span and concentration intact, recent and remote memory intact, fund of knowledge intact.  Speech fluent and not dysarthric, language intact.  CN II-XII intact. Bulk and tone normal, muscle strength 5/5 throughout.  Sensation to light touch intact.  Deep tendon reflexes 2+ throughout, toes downgoing.  Finger to nose and heel to shin testing intact.  Gait normal, Romberg negative.  IMPRESSION: migraine without aura, without status migrainosus, not intractable.  She has had a reduction in migraine frequency and would like to continue it.    PLAN: 1.  For preventative management, continue Botox.  She is also on propranolol 2.  For abortive therapy, she will stop Relpax.  Instead, she will try Ubrelvy. 3.  Limit use of pain relievers to no more than 2 days out of week to prevent risk of rebound or medication-overuse headache. 4.  Keep headache diary 5.  Exercise, hydration, caffeine cessation, sleep hygiene, monitor for and avoid triggers 6.  Consider:  magnesium citrate 400mg  daily, riboflavin 400mg  daily, and coenzyme Q10 100mg  three times daily 7.  Follow up  for next round of Botox.  Metta Clines, DO  CC: Gala Romney, MD

## 2018-06-25 NOTE — Patient Instructions (Signed)
1.  Continue Botox 2.  Stop eletriptan.  When you get a migraine, take Ubrelvy 100mg .  May repeat dose once after 2 hours if needed (maximum 2 tablets in 24 hour) 3.  Limit use of pain relievers to no more than 2 days out of week to prevent risk of rebound or medication-overuse headache. 4.  Keep headache diary 5.  Exercise, hydration, caffeine cessation, sleep hygiene, monitor for and avoid triggers 6.  Consider:  magnesium citrate 400mg  daily, riboflavin 400mg  daily, and coenzyme Q10 100mg  three times daily 7.  Follow up for next round of Botox

## 2018-07-01 ENCOUNTER — Ambulatory Visit: Payer: Medicaid Other | Admitting: Family Medicine

## 2018-07-02 ENCOUNTER — Other Ambulatory Visit (HOSPITAL_COMMUNITY)
Admission: RE | Admit: 2018-07-02 | Discharge: 2018-07-02 | Disposition: A | Discharge status: Home / Self Care | Payer: Medicaid Other | Source: Ambulatory Visit | Attending: Family Medicine | Admitting: Family Medicine

## 2018-07-02 ENCOUNTER — Encounter: Payer: Self-pay | Admitting: Advanced Practice Midwife

## 2018-07-02 ENCOUNTER — Other Ambulatory Visit: Payer: Self-pay

## 2018-07-02 ENCOUNTER — Ambulatory Visit (INDEPENDENT_AMBULATORY_CARE_PROVIDER_SITE_OTHER): Payer: Medicaid Other | Admitting: Advanced Practice Midwife

## 2018-07-02 VITALS — BP 110/74 | HR 73 | Wt 185.2 lb

## 2018-07-02 DIAGNOSIS — N898 Other specified noninflammatory disorders of vagina: Secondary | ICD-10-CM

## 2018-07-02 MED ORDER — FLUCONAZOLE 150 MG PO TABS: 150.0000 mg | ORAL_TABLET | Freq: Once | ORAL | 0 refills | Status: AC

## 2018-07-02 NOTE — Patient Instructions (Signed)
Preventive Care 40-64 Years, Female Preventive care refers to lifestyle choices and visits with your health care provider that can promote health and wellness. What does preventive care include?   A yearly physical exam. This is also called an annual well check.  Dental exams once or twice a year.  Routine eye exams. Ask your health care provider how often you should have your eyes checked.  Personal lifestyle choices, including: ? Daily care of your teeth and gums. ? Regular physical activity. ? Eating a healthy diet. ? Avoiding tobacco and drug use. ? Limiting alcohol use. ? Practicing safe sex. ? Taking low-dose aspirin daily starting at age 50. ? Taking vitamin and mineral supplements as recommended by your health care provider. What happens during an annual well check? The services and screenings done by your health care provider during your annual well check will depend on your age, overall health, lifestyle risk factors, and family history of disease. Counseling Your health care provider may ask you questions about your:  Alcohol use.  Tobacco use.  Drug use.  Emotional well-being.  Home and relationship well-being.  Sexual activity.  Eating habits.  Work and work environment.  Method of birth control.  Menstrual cycle.  Pregnancy history. Screening You may have the following tests or measurements:  Height, weight, and BMI.  Blood pressure.  Lipid and cholesterol levels. These may be checked every 5 years, or more frequently if you are over 50 years old.  Skin check.  Lung cancer screening. You may have this screening every year starting at age 55 if you have a 30-pack-year history of smoking and currently smoke or have quit within the past 15 years.  Colorectal cancer screening. All adults should have this screening starting at age 50 and continuing until age 75. Your health care provider may recommend screening at age 45. You will have tests every  1-10 years, depending on your results and the type of screening test. People at increased risk should start screening at an earlier age. Screening tests may include: ? Guaiac-based fecal occult blood testing. ? Fecal immunochemical test (FIT). ? Stool DNA test. ? Virtual colonoscopy. ? Sigmoidoscopy. During this test, a flexible tube with a tiny camera (sigmoidoscope) is used to examine your rectum and lower colon. The sigmoidoscope is inserted through your anus into your rectum and lower colon. ? Colonoscopy. During this test, a long, thin, flexible tube with a tiny camera (colonoscope) is used to examine your entire colon and rectum.  Hepatitis C blood test.  Hepatitis B blood test.  Sexually transmitted disease (STD) testing.  Diabetes screening. This is done by checking your blood sugar (glucose) after you have not eaten for a while (fasting). You may have this done every 1-3 years.  Mammogram. This may be done every 1-2 years. Talk to your health care provider about when you should start having regular mammograms. This may depend on whether you have a family history of breast cancer.  BRCA-related cancer screening. This may be done if you have a family history of breast, ovarian, tubal, or peritoneal cancers.  Pelvic exam and Pap test. This may be done every 3 years starting at age 21. Starting at age 30, this may be done every 5 years if you have a Pap test in combination with an HPV test.  Bone density scan. This is done to screen for osteoporosis. You may have this scan if you are at high risk for osteoporosis. Discuss your test results, treatment options,   and if necessary, the need for more tests with your health care provider. Vaccines Your health care provider may recommend certain vaccines, such as:  Influenza vaccine. This is recommended every year.  Tetanus, diphtheria, and acellular pertussis (Tdap, Td) vaccine. You may need a Td booster every 10 years.  Varicella  vaccine. You may need this if you have not been vaccinated.  Zoster vaccine. You may need this after age 38.  Measles, mumps, and rubella (MMR) vaccine. You may need at least one dose of MMR if you were born in 1957 or later. You may also need a second dose.  Pneumococcal 13-valent conjugate (PCV13) vaccine. You may need this if you have certain conditions and were not previously vaccinated.  Pneumococcal polysaccharide (PPSV23) vaccine. You may need one or two doses if you smoke cigarettes or if you have certain conditions.  Meningococcal vaccine. You may need this if you have certain conditions.  Hepatitis A vaccine. You may need this if you have certain conditions or if you travel or work in places where you may be exposed to hepatitis A.  Hepatitis B vaccine. You may need this if you have certain conditions or if you travel or work in places where you may be exposed to hepatitis B.  Haemophilus influenzae type b (Hib) vaccine. You may need this if you have certain conditions. Talk to your health care provider about which screenings and vaccines you need and how often you need them. This information is not intended to replace advice given to you by your health care provider. Make sure you discuss any questions you have with your health care provider. Document Released: 04/29/2015 Document Revised: 05/23/2017 Document Reviewed: 02/01/2015 Elsevier Interactive Patient Education  2019 [employer].

## 2018-07-02 NOTE — Progress Notes (Signed)
GYNECOLOGY PROBLEM ENCOUNTER NOTE  History:     Christine Vega is a 48 y.o. G31P2002 female here for GYN problem visit.  Current complaints: new onset vaginal discharge and internal vaginal irritation. Patient states she uses Dove Sensitive Skin for her personal hygiene but she believes the formula has changed as she washed with a new bottle from [facility] and experienced vaginal irritation and abnormal discharge three days later. She denies new sexual contacts or other new irritants. Denies abnormal vaginal bleeding, pelvic pain, problems with intercourse or other gynecologic concerns.    Gynecologic History Patient's last menstrual period was 01/13/2016 (exact date). Contraception: condoms and tubal ligation Last Pap: 12/04/2017. Results were: normal with negative HPV Last mammogram: 12/13/2017. Results were: normal  Obstetric History OB History  Gravida Para Term Preterm AB Living  2 2 2  0 0 2  SAB TAB Ectopic Multiple Live Births  0 0 0 0 2    # Outcome Date GA Lbr Len/2nd Weight Sex Delivery Anes PTL Lv  2 Term      Vag-Spont   LIV  1 Term      Vag-Spont   LIV    Past Medical History:  Diagnosis Date  . Anemia    history   . Anxiety   . Back pain    rotates to left hip and tailbone  . Depression   . DUB (dysfunctional uterine bleeding) 12/01/2010  . Endometriosis   . Fractured coccyx ([place])   . Heart murmur    no problems  . History of seasonal allergies   . Hyperlipidemia   . Migraine   . Migraines    tx w/depakote/verapamil per pt  . Murmur, heart   . Neuromuscular disorder ([place])    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, walks/balance ok  . Post traumatic stress disorder (PTSD)   . Seizures ([place]) 2004   x 1; unknown source  . Uterine leiomyoma     Past Surgical History:  Procedure Laterality Date  . CHOLECYSTECTOMY    . HERNIA REPAIR    . HYSTERECTOMY ABDOMINAL WITH SALPINGECTOMY Left 02/07/2016   Procedure: HYSTERECTOMY ABDOMINAL WITH  left SALPINGECTOMY;   Surgeon: Mora Bellman, MD;  Location: [facility];  Service: Gynecology;  Laterality: Left;  . LAPAROSCOPY  05/16/2011   Procedure: LAPAROSCOPY OPERATIVE;  Surgeon: Emeterio Reeve, MD;  Location: [facility];  Service: Gynecology;  Laterality: N/A;  poss oophorectomy  . LAPAROSCOPY  08/06/2011   Procedure: LAPAROSCOPY OPERATIVE;  Surgeon: Woodroe Mode, MD;  Location: [facility];  Service: Gynecology;  Laterality: N/A;  . LAPAROTOMY     for endometriosis/adhesions  . SALPINGOOPHORECTOMY  08/06/2011   Procedure: SALPINGO OOPHERECTOMY;  Surgeon: Woodroe Mode, MD;  Location: [facility];  Service: Gynecology;  Laterality: Right;  . SVD     x 2  . TUBAL LIGATION    . WISDOM TOOTH EXTRACTION      Current Outpatient Medications on File Prior to Visit  Medication Sig Dispense Refill  . buPROPion (WELLBUTRIN XL) 150 MG 24 hr tablet Take 150 mg by mouth daily.    . butalbital-acetaminophen-caffeine (FIORICET, ESGIC) 50-325-40 MG tablet Take 1 tablet by mouth every 8 (eight) hours as needed for migraine.    . cetirizine (ZYRTEC) 10 MG tablet Take 10 mg by mouth daily as needed for allergies.    Marland Kitchen gabapentin (NEURONTIN) 300 MG capsule Take 300 mg by mouth 3 (three) times daily.    Marland Kitchen HYDROcodone-acetaminophen (NORCO/VICODIN) 5-325 MG tablet  TK 1 T PO Q 6 H  0  . hydrOXYzine (VISTARIL) 25 MG capsule Take 25 mg by mouth 3 (three) times daily as needed for anxiety.    . meloxicam (MOBIC) 15 MG tablet Take 1 tablet (15 mg total) by mouth daily. 30 tablet 1  . prochlorperazine (COMPAZINE) 10 MG tablet Take 10 mg by mouth every 6 (six) hours as needed for nausea or vomiting.    . propranolol ER (INDERAL LA) 120 MG 24 hr capsule Take 120 mg by mouth daily.    . sertraline (ZOLOFT) 100 MG tablet Take 100 mg by mouth daily.    . simvastatin (ZOCOR) 20 MG tablet TK 1 T PO D  1  . tiZANidine (ZANAFLEX) 4 MG tablet Take 4 mg by mouth every 12 (twelve) hours as needed for muscle spasms.     Marland Kitchen triamcinolone (KENALOG) 0.025 %  ointment Apply 1 application topically 2 (two) times daily. 80 g 2  . Ubrogepant (UBRELVY) 100 MG TABS Take 100 mg by mouth as needed. May repeat dose after 2 hours if needed.  Maximum 2 tablets in 24 hours 16 tablet 3  . cyclobenzaprine (FLEXERIL) 5 MG tablet Take 1 tablet (5 mg total) by mouth 3 (three) times daily as needed for muscle spasms. 10 tablet 0  . divalproex (DEPAKOTE) 500 MG DR tablet TAKE 2 TABLETS BY MOUTH TWICE DAILY (Patient not taking: No sig reported) 120 tablet 2  . naproxen (NAPROSYN) 500 MG tablet Take 1 tablet (500 mg total) by mouth every 12 (twelve) hours as needed. 16 tablet 2  . promethazine (PROMETHEGAN) 50 MG suppository Place 50 mg rectally every 8 (eight) hours as needed for nausea or vomiting.      No current facility-administered medications on file prior to visit.     Allergies  Allergen Reactions  . Penicillins Hives, Itching and Other (See Comments)    Has patient had a PCN reaction causing immediate rash, facial/tongue/throat swelling, SOB or lightheadedness with hypotension: No Has patient had a PCN reaction causing severe rash involving mucus membranes or skin necrosis: No Has patient had a PCN reaction that required hospitalization: No Has patient had a PCN reaction occurring within the last 10 years: Yes If all of the above answers are "NO", then may proceed with Cephalosporin use.   . Prednisone Hives and Itching  . Lipitor [Atorvastatin Calcium]   . Zithromax [Azithromycin] Hives and Itching    Social History:  reports that she has never smoked. She has never used smokeless tobacco. She reports that she does not drink alcohol or use drugs.  Family History  Problem Relation Age of Onset  . Migraines Mother   . Heart disease Father   . Migraines Father   . Migraines Daughter   . Breast cancer Neg Hx     The following portions of the patient's history were reviewed and updated as appropriate: allergies, current medications, past family  history, past medical history, past social history, past surgical history and problem list.  Review of Systems Pertinent items noted in HPI and remainder of comprehensive ROS otherwise negative.  Physical Exam:  BP 110/74   Pulse 73   Wt 185 lb 3.2 oz (84 kg)   LMP 01/13/2016 (Exact Date)   BMI 25.47 kg/m  CONSTITUTIONAL: Well-developed, well-nourished female in no acute distress.  HENT:  Normocephalic, atraumatic, External right and left ear normal. Oropharynx is clear and moist EYES: Conjunctivae and EOM are normal. Pupils are equal, round, and  reactive to light. No scleral icterus.  NECK: Normal range of motion, supple, no masses.  Normal thyroid.  SKIN: Skin is warm and dry. No rash noted. Not diaphoretic. No erythema. No pallor. MUSCULOSKELETAL: Normal range of motion. No tenderness.  No cyanosis, clubbing, or edema.  2+ distal pulses. NEUROLOGIC: Alert and oriented to person, place, and time. Normal reflexes, muscle tone coordination. No cranial nerve deficit noted. PSYCHIATRIC: Normal mood and affect. Normal behavior. Normal judgment and thought content. CARDIOVASCULAR: Normal heart rate noted, regular rhythm RESPIRATORY: Clear to auscultation bilaterally. Effort and breath sounds normal, no problems with respiration noted. BREASTS: Symmetric in size. No masses, skin changes, nipple drainage, or lymphadenopathy. ABDOMEN: Soft, normal bowel sounds, no distention noted.  No tenderness, rebound or guarding.  PELVIC: Normal appearing external genitalia; normal appearing vaginal mucosa and cervix.  Thick white clumps of discharge noted on swab collection. Not foul-smelling.  Bimanual exam not performed.   Assessment and Plan:    1. Vaginal discharge - Presumptive treatment for candida based on exam. Rx to pharmacy - Cervicovaginal ancillary only( [place])  Will follow up results of vaginal swab PRN Routine preventative health maintenance measures emphasized. Please refer to  After Visit Summary for other counseling recommendations.     Total visit time 15 minutes. Greater than 50% spent in counseling and coordination of care  Mallie Snooks, [facility] for [facility]

## 2018-07-03 ENCOUNTER — Other Ambulatory Visit: Payer: Self-pay | Admitting: Advanced Practice Midwife

## 2018-07-05 LAB — CERVICOVAGINAL ANCILLARY ONLY
Bacterial vaginitis: NEGATIVE
Candida vaginitis: POSITIVE — AB
Chlamydia: NEGATIVE
Neisseria Gonorrhea: NEGATIVE
Trichomonas: NEGATIVE

## 2018-07-07 ENCOUNTER — Other Ambulatory Visit: Payer: Self-pay | Admitting: *Deleted

## 2018-07-07 MED ORDER — FLUCONAZOLE 150 MG PO TABS: 150.0000 mg | ORAL_TABLET | Freq: Once | ORAL | 3 refills | Status: AC

## 2018-07-10 ENCOUNTER — Other Ambulatory Visit: Payer: Self-pay | Admitting: Obstetrics & Gynecology

## 2018-07-30 ENCOUNTER — Telehealth: Payer: Self-pay | Admitting: Oncology

## 2018-07-30 NOTE — Telephone Encounter (Signed)
Unable to reach patient per 4/15 sch message - and unable to leave message - rescheduled appt and sent letter in the mail.

## 2018-08-05 ENCOUNTER — Other Ambulatory Visit: Payer: Self-pay

## 2018-08-05 ENCOUNTER — Encounter: Payer: Self-pay | Admitting: Obstetrics & Gynecology

## 2018-08-05 ENCOUNTER — Ambulatory Visit (INDEPENDENT_AMBULATORY_CARE_PROVIDER_SITE_OTHER): Payer: Medicaid Other | Admitting: Obstetrics & Gynecology

## 2018-08-05 ENCOUNTER — Other Ambulatory Visit (HOSPITAL_COMMUNITY)
Admission: RE | Admit: 2018-08-05 | Discharge: 2018-08-05 | Disposition: A | Discharge status: Home / Self Care | Payer: Medicaid Other | Source: Ambulatory Visit | Attending: Obstetrics & Gynecology | Admitting: Obstetrics & Gynecology

## 2018-08-05 VITALS — BP 119/77 | HR 81 | Wt 190.0 lb

## 2018-08-05 DIAGNOSIS — N898 Other specified noninflammatory disorders of vagina: Secondary | ICD-10-CM | POA: Diagnosis not present

## 2018-08-05 MED ORDER — FLUCONAZOLE 150 MG PO TABS: 150.0000 mg | ORAL_TABLET | Freq: Once | ORAL | 3 refills | Status: AC

## 2018-08-05 NOTE — Progress Notes (Signed)
   Subjective:    Patient ID: Christine Vega, female    DOB: [birth year], 48 y.o.   MRN: [id number]  HPI 48 yo single P2 (79 and 72yo daughters) here with vaginal irritation. She had a condom break about a week ago. She has been with current partner for about 6 months. She knows that she is monogamous but hopes that he is. She has had recurrent documented yeast infections. She says that diflucan generally helps.  Review of Systems She had a hysterectomy in the past.    Objective:   Physical Exam Breathing, conversing, and ambulating normally Well nourished, well hydrated Black female, no apparent distress Abd- benign Vaginal discharge- white, no odor    Assessment & Plan:  Vaginal irritation- treat with diflucan Wet prep sent

## 2018-08-07 ENCOUNTER — Other Ambulatory Visit: Payer: Self-pay | Admitting: Neurology

## 2018-08-08 LAB — CERVICOVAGINAL ANCILLARY ONLY
Bacterial vaginitis: NEGATIVE
Candida vaginitis: POSITIVE — AB
Chlamydia: NEGATIVE
Neisseria Gonorrhea: NEGATIVE
Trichomonas: NEGATIVE

## 2018-08-11 ENCOUNTER — Telehealth: Payer: Self-pay | Admitting: *Deleted

## 2018-08-11 NOTE — Telephone Encounter (Signed)
Pt informed of lab results. 

## 2018-08-20 ENCOUNTER — Other Ambulatory Visit: Payer: Medicaid Other

## 2018-08-20 ENCOUNTER — Ambulatory Visit: Payer: Medicaid Other | Admitting: Oncology

## 2018-09-12 ENCOUNTER — Telehealth: Payer: Self-pay | Admitting: Neurology

## 2018-09-12 NOTE — Telephone Encounter (Signed)
Patient called regarding her new medication that was to be called into her pharmacy after her last appointment. She said it was never sent over to the pharmacy. The pharmacy is telling her it needs a Prior Auth. Thanks

## 2018-09-12 NOTE — Telephone Encounter (Signed)
Christine Vega was sent to pharmacy on 3/11, MCD has denied, will appeal

## 2018-09-19 ENCOUNTER — Encounter (HOSPITAL_COMMUNITY): Payer: Self-pay | Admitting: *Deleted

## 2018-09-19 ENCOUNTER — Ambulatory Visit: Payer: Medicaid Other | Admitting: Neurology

## 2018-09-19 ENCOUNTER — Emergency Department (HOSPITAL_COMMUNITY)
Admission: EM | Admit: 2018-09-19 | Discharge: 2018-09-19 | Disposition: A | Discharge status: Home / Self Care | Payer: No Typology Code available for payment source | Attending: Emergency Medicine | Admitting: Emergency Medicine

## 2018-09-19 ENCOUNTER — Other Ambulatory Visit: Payer: Self-pay

## 2018-09-19 ENCOUNTER — Emergency Department (HOSPITAL_COMMUNITY): Payer: No Typology Code available for payment source

## 2018-09-19 DIAGNOSIS — Y9389 Activity, other specified: Secondary | ICD-10-CM | POA: Insufficient documentation

## 2018-09-19 DIAGNOSIS — Y929 Unspecified place or not applicable: Secondary | ICD-10-CM | POA: Insufficient documentation

## 2018-09-19 DIAGNOSIS — Y998 Other external cause status: Secondary | ICD-10-CM | POA: Insufficient documentation

## 2018-09-19 DIAGNOSIS — S39012A Strain of muscle, fascia and tendon of lower back, initial encounter: Secondary | ICD-10-CM | POA: Diagnosis not present

## 2018-09-19 DIAGNOSIS — S3992XA Unspecified injury of lower back, initial encounter: Secondary | ICD-10-CM | POA: Diagnosis present

## 2018-09-19 DIAGNOSIS — Z79899 Other long term (current) drug therapy: Secondary | ICD-10-CM | POA: Insufficient documentation

## 2018-09-19 MED ORDER — ACETAMINOPHEN 500 MG PO TABS
1000.0000 mg | ORAL_TABLET | Freq: Once | ORAL | Status: AC
  Administered 2018-09-19: 1000 mg via ORAL
  Filled 2018-09-19: qty 2
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
## 2018-09-19 NOTE — Discharge Instructions (Signed)
Return for weakness or numbness in arms or legs, new injuries or new concerns. Use Tylenol, Motrin and ice as needed for pain over the weekend.

## 2018-09-19 NOTE — ED Notes (Signed)
Patient transported to X-ray 
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
## 2018-09-26 NOTE — Progress Notes (Signed)
Botox form completed, signed and faxed to [facility]

## 2018-09-26 NOTE — Progress Notes (Signed)
Form completed, signed and faxed to [facility]

## 2018-09-30 ENCOUNTER — Telehealth: Payer: Self-pay | Admitting: Neurology

## 2018-09-30 NOTE — Telephone Encounter (Signed)
Called and LMOVM advising Pt Botox has been approved, will be delivered on Thursday 10/02/18

## 2018-09-30 NOTE — Progress Notes (Signed)
Called [facility], spoke with [place]. Botox approved. PA# [id number] 09/26/18 - 09/21/19  Ref call # [id number]   [place] [phone], spoke with [place].  Botox to be delivered 10/02/18

## 2018-09-30 NOTE — Telephone Encounter (Signed)
Pt called requesting to speak with [facility] asst. Pt request return ph call. Confirmed ph on chart is correct. Pt states she needs to talk to the assistant to figure out what she needs to do to work out her appt this Friday 10/03/18. Pt refused front office assistance.

## 2018-10-03 ENCOUNTER — Other Ambulatory Visit: Payer: Self-pay

## 2018-10-03 ENCOUNTER — Ambulatory Visit (INDEPENDENT_AMBULATORY_CARE_PROVIDER_SITE_OTHER): Payer: Medicaid Other | Admitting: Neurology

## 2018-10-03 DIAGNOSIS — G43709 Chronic migraine without aura, not intractable, without status migrainosus: Secondary | ICD-10-CM

## 2018-10-03 MED ORDER — ONABOTULINUMTOXINA 100 UNITS IJ SOLR
200.0000 [IU] | Freq: Once | INTRAMUSCULAR | Status: AC
  Administered 2018-10-03: 200 [IU] via INTRAMUSCULAR

## 2018-10-03 NOTE — Progress Notes (Signed)
[facility]   Procedure Note Botox  Attending: Dr. Tomi Likens  Preoperative Diagnosis(es): Chronic migraine  Consent obtained from: The patient Benefits discussed included, but were not limited to decreased muscle tightness, increased joint range of motion, and decreased pain.  Risk discussed included, but were not limited pain and discomfort, bleeding, bruising, excessive weakness, venous thrombosis, muscle atrophy and dysphagia.  Anticipated outcomes of the procedure as well as he risks and benefits of the alternatives to the procedure, and the roles and tasks of the personnel to be involved, were discussed with the patient, and the patient consents to the procedure and agrees to proceed. A copy of the patient medication guide was given to the patient which explains the blackbox warning.  Patients identity and treatment sites confirmed Yes.  .  Details of Procedure: Skin was cleaned with alcohol. Prior to injection, the needle plunger was aspirated to make sure the needle was not within a blood vessel.  There was no blood retrieved on aspiration.    Following is a summary of the muscles injected  And the amount of Botulinum toxin used:  Dilution 200 units of Botox was reconstituted with 4 ml of preservative free normal saline. Time of reconstitution: At the time of the office visit (<30 minutes prior to injection)   Injections  155 total units of Botox was injected with a 30 gauge needle.  Injection Sites: L occipitalis: 15 units- 3 sites  R occiptalis: 15 units- 3 sites  L upper trapezius: 15 units- 3 sites R upper trapezius: 15 units- 3 sits          L paraspinal: 10 units- 2 sites R paraspinal: 10 units- 2 sites  Face L frontalis(2 injection sites):10 units   R frontalis(2 injection sites):10 units         L corrugator: 5 units   R corrugator: 5 units           Procerus: 5 units   L temporalis: 20 units R temporalis: 20 units   Agent:  200 units of botulinum Type A  (Onobotulinum Toxin type A) was reconstituted with 4 ml of preservative free normal saline.  Time of reconstitution: At the time of the office visit (<30 minutes prior to injection)     Total injected (Units): 155  Total wasted (Units): no waste  Patient tolerated procedure well without complications.   Reinjection is anticipated in 3 months. Return to clinic in 4 1/2 months

## 2018-10-07 ENCOUNTER — Other Ambulatory Visit: Payer: Self-pay | Admitting: Obstetrics & Gynecology

## 2018-10-07 NOTE — Progress Notes (Signed)
Prior authorization for Christine Vega denied.  Will provide patient with copay card info and directions.  Christine Vega VEH:[id number] PCN:[id number] JGG:[id number] YY#:[id number]

## 2018-10-08 ENCOUNTER — Encounter: Payer: Self-pay | Admitting: *Deleted

## 2018-10-08 ENCOUNTER — Other Ambulatory Visit: Payer: Self-pay | Admitting: *Deleted

## 2018-10-08 MED ORDER — METRONIDAZOLE 500 MG PO TABS: 500.0000 mg | ORAL_TABLET | Freq: Two times a day (BID) | ORAL | 0 refills | Status: DC

## 2018-10-08 MED ORDER — FLUCONAZOLE 150 MG PO TABS: 150.0000 mg | ORAL_TABLET | Freq: Once | ORAL | 3 refills | Status: AC

## 2018-10-10 ENCOUNTER — Other Ambulatory Visit: Payer: Self-pay | Admitting: Neurology

## 2018-10-13 ENCOUNTER — Other Ambulatory Visit: Payer: Self-pay

## 2018-10-13 MED ORDER — ELETRIPTAN HYDROBROMIDE 40 MG PO TABS: 40.0000 mg | ORAL_TABLET | ORAL | 3 refills | Status: DC | PRN

## 2018-10-31 NOTE — Telephone Encounter (Signed)
error 

## 2018-11-20 ENCOUNTER — Inpatient Hospital Stay (HOSPITAL_BASED_OUTPATIENT_CLINIC_OR_DEPARTMENT_OTHER): Payer: Medicaid Other | Admitting: Oncology

## 2018-11-20 ENCOUNTER — Telehealth: Payer: Self-pay

## 2018-11-20 ENCOUNTER — Other Ambulatory Visit: Payer: Self-pay

## 2018-11-20 ENCOUNTER — Inpatient Hospital Stay: Payer: Medicaid Other | Attending: Oncology

## 2018-11-20 VITALS — BP 127/80 | HR 94 | Temp 98.5°F | Resp 17 | Ht 71.5 in | Wt 196.0 lb

## 2018-11-20 DIAGNOSIS — Z881 Allergy status to other antibiotic agents status: Secondary | ICD-10-CM | POA: Diagnosis not present

## 2018-11-20 DIAGNOSIS — M2550 Pain in unspecified joint: Secondary | ICD-10-CM | POA: Diagnosis not present

## 2018-11-20 DIAGNOSIS — M791 Myalgia, unspecified site: Secondary | ICD-10-CM | POA: Diagnosis not present

## 2018-11-20 DIAGNOSIS — Z79899 Other long term (current) drug therapy: Secondary | ICD-10-CM | POA: Diagnosis not present

## 2018-11-20 DIAGNOSIS — D500 Iron deficiency anemia secondary to blood loss (chronic): Secondary | ICD-10-CM

## 2018-11-20 DIAGNOSIS — Z880 Allergy status to penicillin: Secondary | ICD-10-CM | POA: Diagnosis not present

## 2018-11-20 DIAGNOSIS — Z888 Allergy status to other drugs, medicaments and biological substances status: Secondary | ICD-10-CM | POA: Insufficient documentation

## 2018-11-20 DIAGNOSIS — D509 Iron deficiency anemia, unspecified: Secondary | ICD-10-CM | POA: Insufficient documentation

## 2018-11-20 LAB — CBC WITH DIFFERENTIAL (CANCER CENTER ONLY)
Abs Immature Granulocytes: 0.02 10*3/uL (ref 0.00–0.07)
Basophils Absolute: 0 10*3/uL (ref 0.0–0.1)
Basophils Relative: 0 %
Eosinophils Absolute: 0 10*3/uL (ref 0.0–0.5)
Eosinophils Relative: 1 %
HCT: 36.9 % (ref 36.0–46.0)
Hemoglobin: 12 g/dL (ref 12.0–15.0)
Immature Granulocytes: 0 %
Lymphocytes Relative: 26 %
Lymphs Abs: 1.5 10*3/uL (ref 0.7–4.0)
MCH: 28.6 pg (ref 26.0–34.0)
MCHC: 32.5 g/dL (ref 30.0–36.0)
MCV: 87.9 fL (ref 80.0–100.0)
Monocytes Absolute: 0.5 10*3/uL (ref 0.1–1.0)
Monocytes Relative: 8 %
Neutro Abs: 3.9 10*3/uL (ref 1.7–7.7)
Neutrophils Relative %: 65 %
Platelet Count: 251 10*3/uL (ref 150–400)
RBC: 4.2 MIL/uL (ref 3.87–5.11)
RDW: 14 % (ref 11.5–15.5)
WBC Count: 6 10*3/uL (ref 4.0–10.5)
nRBC: 0 % (ref 0.0–0.2)

## 2018-11-20 LAB — IRON AND TIBC
Iron: 78 ug/dL (ref 41–142)
Saturation Ratios: 25 % (ref 21–57)
TIBC: 315 ug/dL (ref 236–444)
UIBC: 237 ug/dL (ref 120–384)

## 2018-11-20 LAB — FERRITIN: Ferritin: 1112 ng/mL — ABNORMAL HIGH (ref 11–307)

## 2018-11-20 NOTE — Progress Notes (Signed)
Hematology and Oncology Follow Up Visit  Christine Vega [id number] 07/14/1970 48 y.o. 11/20/2018 2:47 PM Calton Dach, Henderson Newcomer, MD   Principle Diagnosis: 69 year old with iron deficiency anemia diagnosed in June 2017.  Christine Vega anemia is related to chronic menstrual blood losses.     Prior Therapy: She is status post Feraheme infusion for a total of 1000 mg completed on multiple occasions.  Last treatment given in May 2018.  Current therapy: Oral iron therapy.  Interim History: [name] here for a repeat evaluation.  Since Christine Vega last visit, she reports no major complaints.  She ambulates without any difficulties but does use a cane.  She denies any falls or syncope.  She denies any hematochezia or melena.  She continues to take oral iron therapy.  She has not had any menstrual cycles at this time with a hysterectomy 2017.  She denied any alteration mental status, neuropathy, confusion or dizziness.  Denies any headaches or lethargy.  Denies any night sweats, weight loss or changes in appetite.  Denied orthopnea, dyspnea on exertion or chest discomfort.  Denies shortness of breath, difficulty breathing hemoptysis or cough.  Denies any abdominal distention, nausea, early satiety or dyspepsia.  Denies any hematuria, frequency, dysuria or nocturia.  Denies any skin irritation, dryness or rash.  Denies any ecchymosis or petechiae.  Denies any lymphadenopathy or clotting.  Denies any heat or cold intolerance.  Denies any anxiety or depression.  Remaining review of system is negative.     Medications: Reviewed without changes. Current Outpatient Medications  Medication Sig Dispense Refill  . buPROPion (WELLBUTRIN XL) 150 MG 24 hr tablet Take 150 mg by mouth daily.    . butalbital-acetaminophen-caffeine (FIORICET, ESGIC) 50-325-40 MG tablet Take 1 tablet by mouth every 8 (eight) hours as needed for migraine.    . cetirizine (ZYRTEC) 10 MG tablet Take 10 mg by mouth daily as needed for  allergies.    . cyclobenzaprine (FLEXERIL) 5 MG tablet Take 1 tablet (5 mg total) by mouth 3 (three) times daily as needed for muscle spasms. 10 tablet 0  . divalproex (DEPAKOTE) 500 MG DR tablet TAKE 2 TABLETS BY MOUTH TWICE DAILY 120 tablet 3  . eletriptan (RELPAX) 40 MG tablet Take 1 tablet (40 mg total) by mouth as needed for migraine or headache. May repeat in 2 hours if headache persists or recurs. 10 tablet 3  . fluconazole (DIFLUCAN) 150 MG tablet TAKE 1 TABLET BY MOUTH ONCE , MAY TAKE ADDITIONAL DOSE 3 DAYS LATER IF SYMPTOMS PERSIST 1 tablet 1  . gabapentin (NEURONTIN) 300 MG capsule Take 300 mg by mouth 3 (three) times daily.    Marland Kitchen HYDROcodone-acetaminophen (NORCO/VICODIN) 5-325 MG tablet TK 1 T PO Q 6 H  0  . hydrOXYzine (VISTARIL) 25 MG capsule Take 25 mg by mouth 3 (three) times daily as needed for anxiety.    . meloxicam (MOBIC) 15 MG tablet Take 1 tablet (15 mg total) by mouth daily. 30 tablet 1  . metroNIDAZOLE (FLAGYL) 500 MG tablet Take 1 tablet (500 mg total) by mouth 2 (two) times daily. 14 tablet 0  . naproxen (NAPROSYN) 500 MG tablet Take 1 tablet (500 mg total) by mouth every 12 (twelve) hours as needed. 16 tablet 2  . prochlorperazine (COMPAZINE) 10 MG tablet Take 10 mg by mouth every 6 (six) hours as needed for nausea or vomiting.    . promethazine (PROMETHEGAN) 50 MG suppository Place 50 mg rectally every 8 (eight) hours as needed  for nausea or vomiting.     . propranolol ER (INDERAL LA) 120 MG 24 hr capsule Take 120 mg by mouth daily.    . sertraline (ZOLOFT) 100 MG tablet Take 100 mg by mouth daily.    . simvastatin (ZOCOR) 20 MG tablet TK 1 T PO D  1  . tiZANidine (ZANAFLEX) 4 MG tablet Take 4 mg by mouth every 12 (twelve) hours as needed for muscle spasms.     Marland Kitchen triamcinolone (KENALOG) 0.025 % ointment Apply 1 application topically 2 (two) times daily. 80 g 2  . Ubrogepant (UBRELVY) 100 MG TABS Take 100 mg by mouth as needed. May repeat dose after 2 hours if needed.   Maximum 2 tablets in 24 hours 16 tablet 3   No current facility-administered medications for this visit.      Allergies:  Allergies  Allergen Reactions  . Penicillins Hives, Itching and Other (See Comments)    Has patient had a PCN reaction causing immediate rash, facial/tongue/throat swelling, SOB or lightheadedness with hypotension: No Has patient had a PCN reaction causing severe rash involving mucus membranes or skin necrosis: No Has patient had a PCN reaction that required hospitalization: No Has patient had a PCN reaction occurring within the last 10 years: Yes If all of the above answers are "NO", then may proceed with Cephalosporin use.   . Prednisone Hives and Itching  . Lipitor [Atorvastatin Calcium]   . Zithromax [Azithromycin] Hives and Itching    Past Medical History, Surgical history, Social history, and Family History remains without change on review.  Physical Exam: Blood pressure 127/80, pulse 94, temperature 98.5 F (36.9 C), temperature source Temporal, resp. rate 17, height 5' 11.5" (1.816 m), weight 196 lb (88.9 kg), last menstrual period 01/13/2016, SpO2 100 %.    ECOG:0    General appearance: Alert, awake without any distress. Head: Atraumatic without abnormalities Oropharynx: Without any thrush or ulcers. Eyes: No scleral icterus. Lymph nodes: No lymphadenopathy noted in the cervical, supraclavicular, or axillary nodes Heart:regular rate and rhythm, without any murmurs or gallops.   Lung: Clear to auscultation without any rhonchi, wheezes or dullness to percussion. Abdomin: Soft, nontender without any shifting dullness or ascites. Musculoskeletal: No clubbing or cyanosis. Neurological: No motor or sensory deficits. Skin: No rashes or lesions.    Lab Results: Lab Results  Component Value Date   WBC 7.6 05/07/2018   HGB 12.7 05/07/2018   HCT 40.4 05/07/2018   MCV 90.0 05/07/2018   PLT 258 05/07/2018     Chemistry      Component Value  Date/Time   NA 138 05/07/2018 2040   K 3.4 (L) 05/07/2018 2040   CL 106 05/07/2018 2040   CO2 21 (L) 05/07/2018 2040   BUN 5 (L) 05/07/2018 2040   CREATININE 0.60 05/07/2018 2040      Component Value Date/Time   CALCIUM 9.3 05/07/2018 2040   ALKPHOS 43 05/07/2018 2040   AST 16 05/07/2018 2040   ALT 16 05/07/2018 2040   BILITOT 0.4 05/07/2018 2040        Impression and Plan:   48 year old woman with:  1.  Iron deficiency anemia diagnosed in June 2017 due to chronic menstrual blood losses..   She continues to be on oral iron replacement without any need for intravenous iron in the last few years.  Options of therapy were reviewed today which include continuing oral iron versus intravenous iron infusion.  Uppercase associated with intravenous iron was reiterated.  These include  arthralgias, myalgias and infusion related complications and rarely anaphylaxis.  After discussion today, she is agreeable to continue with oral iron at this time.  Christine Vega hemoglobin reviewed today continues to be within normal range.    2. Colon cancer screening: She will have to have colonoscopy this year as she turns 57.    3. Follow-up: Will be in 6 months for repeat evaluation.  15  minutes was spent with the patient face-to-face today.  More than 50% of time was spent on reviewing Christine Vega laboratory data, treatment options and complications related therapy.    Zola Button, MD 8/6/20202:47 PM

## 2018-11-20 NOTE — Telephone Encounter (Signed)
Contacted patient, made aware of iron level, and to stop taking iron supplement. Patient verbalized understanding and all questions were answered.

## 2018-11-20 NOTE — Telephone Encounter (Signed)
-----   Message from Wyatt Portela, MD sent at 11/20/2018  3:57 PM EDT ----- Please let her know her iron is normal. She can stop iron supplement now.

## 2018-12-30 ENCOUNTER — Telehealth: Payer: Self-pay | Admitting: *Deleted

## 2018-12-30 NOTE — Telephone Encounter (Signed)
Called left message with phone number to call to set up Botox delivery and said it needed to be delivered by Thursday. [facility] [phone] for delivery

## 2018-12-31 ENCOUNTER — Other Ambulatory Visit: Payer: Self-pay

## 2018-12-31 ENCOUNTER — Ambulatory Visit (INDEPENDENT_AMBULATORY_CARE_PROVIDER_SITE_OTHER): Payer: Medicaid Other | Admitting: Advanced Practice Midwife

## 2018-12-31 ENCOUNTER — Encounter: Payer: Self-pay | Admitting: Advanced Practice Midwife

## 2018-12-31 VITALS — BP 111/73 | HR 101

## 2018-12-31 DIAGNOSIS — N898 Other specified noninflammatory disorders of vagina: Secondary | ICD-10-CM

## 2018-12-31 MED ORDER — FLUCONAZOLE 150 MG PO TABS: 150.0000 mg | ORAL_TABLET | Freq: Once | ORAL | 0 refills | Status: AC

## 2018-12-31 NOTE — Progress Notes (Signed)
Thinks maybe a piece of condom in her vagina

## 2018-12-31 NOTE — Progress Notes (Signed)
GYNECOLOGY PROBLEM VISIT NOTE  History:     Christine Vega is a 48 y.o. G55P2002 female here for gyn problem visit.  Current complaints: patient states the tip of her partner's condom "broke off" during intercourse about one-two weeks ago and she is concerned that she can feel a piece of the remaining condom tip in her vaginal vault..   Denies abnormal vaginal bleeding, discharge, pelvic pain, problems with intercourse or other gynecologic concerns.    Gynecologic History Patient's last menstrual period was 01/13/2016 (exact date). Contraception: condoms and tubal ligation Last Pap: 12/04/2017. Results were:normal with negative HPV Last mammogram: 12/13/2017. Results were: normal  Obstetric History OB History  Gravida Para Term Preterm AB Living  2 2 2  0 0 2  SAB TAB Ectopic Multiple Live Births  0 0 0 0 2    # Outcome Date GA Lbr Len/2nd Weight Sex Delivery Anes PTL Lv  2 Term      Vag-Spont   LIV  1 Term      Vag-Spont   LIV    Past Medical History:  Diagnosis Date  . Anemia    history   . Anxiety   . Back pain    rotates to left hip and tailbone  . Depression   . DUB (dysfunctional uterine bleeding) 12/01/2010  . Endometriosis   . Fractured coccyx ([place])   . Heart murmur    no problems  . History of seasonal allergies   . Hyperlipidemia   . Migraine   . Migraines    tx w/depakote/verapamil per pt  . Murmur, heart   . 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 Post traumatic stress disorder (PTSD)   . Seizures ([place]) 2004   x 1; unknown source  . Uterine leiomyoma     Past Surgical History:  Procedure Laterality Date  . CHOLECYSTECTOMY    . HERNIA REPAIR    . HYSTERECTOMY ABDOMINAL WITH SALPINGECTOMY Left 02/07/2016   Procedure: HYSTERECTOMY ABDOMINAL WITH  left SALPINGECTOMY;  Surgeon: Mora Bellman, MD;  Location: [facility];  Service: Gynecology;  Laterality: Left;  . LAPAROSCOPY  05/16/2011   Procedure: LAPAROSCOPY  OPERATIVE;  Surgeon: Emeterio Reeve, MD;  Location: [facility];  Service: Gynecology;  Laterality: N/A;  poss oophorectomy  . LAPAROSCOPY  08/06/2011   Procedure: LAPAROSCOPY OPERATIVE;  Surgeon: Woodroe Mode, MD;  Location: [facility];  Service: Gynecology;  Laterality: N/A;  . LAPAROTOMY     for endometriosis/adhesions  . SALPINGOOPHORECTOMY  08/06/2011   Procedure: SALPINGO OOPHERECTOMY;  Surgeon: Woodroe Mode, MD;  Location: [facility];  Service: Gynecology;  Laterality: Right;  . SVD     x 2  . TUBAL LIGATION    . WISDOM TOOTH EXTRACTION      Current Outpatient Medications on File Prior to Visit  Medication Sig Dispense Refill  . buPROPion (WELLBUTRIN XL) 150 MG 24 hr tablet Take 150 mg by mouth daily.    . butalbital-acetaminophen-caffeine (FIORICET, ESGIC) 50-325-40 MG tablet Take 1 tablet by mouth every 8 (eight) hours as needed for migraine.    . cetirizine (ZYRTEC) 10 MG tablet Take 10 mg by mouth daily as needed for allergies.    . cyclobenzaprine (FLEXERIL) 5 MG tablet Take 1 tablet (5 mg total) by mouth 3 (three) times daily as needed for muscle spasms. 10 tablet 0  . divalproex (DEPAKOTE) 500 MG DR tablet TAKE 2 TABLETS BY MOUTH TWICE DAILY 120  tablet 3  . eletriptan (RELPAX) 40 MG tablet Take 1 tablet (40 mg total) by mouth as needed for migraine or headache. May repeat in 2 hours if headache persists or recurs. 10 tablet 3  . gabapentin (NEURONTIN) 300 MG capsule Take 300 mg by mouth 3 (three) times daily.    Marland Kitchen HYDROcodone-acetaminophen (NORCO/VICODIN) 5-325 MG tablet TK 1 T PO Q 6 H  0  . hydrOXYzine (VISTARIL) 25 MG capsule Take 25 mg by mouth 3 (three) times daily as needed for anxiety.    . meloxicam (MOBIC) 15 MG tablet Take 1 tablet (15 mg total) by mouth daily. 30 tablet 1  . metroNIDAZOLE (FLAGYL) 500 MG tablet Take 1 tablet (500 mg total) by mouth 2 (two) times daily. 14 tablet 0  . naproxen (NAPROSYN) 500 MG tablet Take 1 tablet (500 mg total) by mouth every 12 (twelve) hours  as needed. 16 tablet 2  . prochlorperazine (COMPAZINE) 10 MG tablet Take 10 mg by mouth every 6 (six) hours as needed for nausea or vomiting.    . promethazine (PROMETHEGAN) 50 MG suppository Place 50 mg rectally every 8 (eight) hours as needed for nausea or vomiting.     . propranolol ER (INDERAL LA) 120 MG 24 hr capsule Take 120 mg by mouth daily.    . sertraline (ZOLOFT) 100 MG tablet Take 100 mg by mouth daily.    . simvastatin (ZOCOR) 20 MG tablet TK 1 T PO D  1  . tiZANidine (ZANAFLEX) 4 MG tablet Take 4 mg by mouth every 12 (twelve) hours as needed for muscle spasms.     Marland Kitchen triamcinolone (KENALOG) 0.025 % ointment Apply 1 application topically 2 (two) times daily. 80 g 2  . Ubrogepant (UBRELVY) 100 MG TABS Take 100 mg by mouth as needed. May repeat dose after 2 hours if needed.  Maximum 2 tablets in 24 hours 16 tablet 3   No current facility-administered medications on file prior to visit.     Allergies  Allergen Reactions  . Penicillins Hives, Itching and Other (See Comments)    Has patient had a PCN reaction causing immediate rash, facial/tongue/throat swelling, SOB or lightheadedness with hypotension: No Has patient had a PCN reaction causing severe rash involving mucus membranes or skin necrosis: No Has patient had a PCN reaction that required hospitalization: No Has patient had a PCN reaction occurring within the last 10 years: Yes If all of the above answers are "NO", then may proceed with Cephalosporin use.   . Prednisone Hives and Itching  . Lipitor [Atorvastatin Calcium]   . Zithromax [Azithromycin] Hives and Itching    Social History:  reports that she has never smoked. She has never used smokeless tobacco. She reports that she does not drink alcohol or use drugs.  Family History  Problem Relation Age of Onset  . Migraines Mother   . Heart disease Father   . Migraines Father   . Migraines Daughter   . Breast cancer Neg Hx     The following portions of the  patient's history were reviewed and updated as appropriate: allergies, current medications, past family history, past medical history, past social history, past surgical history and problem list.  Review of Systems Pertinent items noted in HPI and remainder of comprehensive ROS otherwise negative.  Physical Exam:  BP 111/73   Pulse (!) 101   LMP 01/13/2016 (Exact Date)  CONSTITUTIONAL: Well-developed, well-nourished female in no acute distress.  HENT:  Normocephalic, atraumatic, External right and  left ear normal. Oropharynx is clear and moist EYES: Conjunctivae and EOM are normal. Pupils are equal, round, and reactive to light. No scleral icterus.  NECK: Normal range of motion, supple, no masses.  Normal thyroid.  SKIN: Skin is warm and dry. No rash noted. Not diaphoretic. No erythema. No pallor. MUSCULOSKELETAL: Normal range of motion. No tenderness.  No cyanosis, clubbing, or edema.  2+ distal pulses. NEUROLOGIC: Alert and oriented to person, place, and time. Normal reflexes, muscle tone coordination. No cranial nerve deficit noted. PSYCHIATRIC: Normal mood and affect. Normal behavior. Normal judgment and thought content. CARDIOVASCULAR: Normal heart rate noted, regular rhythm RESPIRATORY: Clear to auscultation bilaterally. Effort and breath sounds normal, no problems with respiration noted. BREASTS: Symmetric in size. No masses, skin changes, nipple drainage, or lymphadenopathy. ABDOMEN: Soft, normal bowel sounds, no distention noted.  No tenderness, rebound or guarding.  PELVIC: Normal appearing external genitalia; normal appearing vaginal mucosa and cervix.  abnormal discharge noted.  Normal uterine size, no other palpable masses, no uterine or adnexal tenderness. 1cm foreign object removed from vaginal vault with fox swabs   Assessment and Plan:    1. Vaginal irritation - Monitor for improvement now that foreign body has been removed - Rx Diflucan based on PE  Routine preventative  health maintenance measures emphasized. Please refer to After Visit Summary for other counseling recommendations.     Mallie Snooks, MSN, CNM Certified Nurse Midwife, [facility] for [employer], [place] 12/31/18 9:09 PM

## 2018-12-31 NOTE — Patient Instructions (Signed)
Vaginitis  Vaginitis is irritation and swelling (inflammation) of the vagina. It happens when normal bacteria and yeast in the vagina grow too much. There are many types of this condition. Treatment will depend on the type you have. Follow these instructions at home: Lifestyle  Keep your vagina area clean and dry. ? Avoid using soap. ? Rinse the area with water.  Do not do the following until your doctor says it is okay: ? Wash and clean out the vagina (douche). ? Use tampons. ? Have sex.  Wipe from front to back after going to the bathroom.  Let air reach your vagina. ? Wear cotton underwear. ? Do not wear: ? Underwear while you sleep. ? Tight pants. ? Thong underwear. ? Underwear or nylons without a cotton panel. ? Take off any wet clothing, such as bathing suits, as soon as possible.  Use gentle, non-scented products. Do not use things that can irritate the vagina, such as fabric softeners. Avoid the following products if they are scented: ? Feminine sprays. ? Detergents. ? Tampons. ? Feminine hygiene products. ? Soaps or bubble baths.  Practice safe sex and use condoms. General instructions  Take over-the-counter and prescription medicines only as told by your doctor.  If you were prescribed an antibiotic medicine, take or use it as told by your doctor. Do not stop taking or using the antibiotic even if you start to feel better.  Keep all follow-up visits as told by your doctor. This is important. Contact a doctor if:  You have pain in your belly.  You have a fever.  Your symptoms last for more than 2-3 days. Get help right away if:  You have a fever and your symptoms get worse all of a sudden. Summary  Vaginitis is irritation and swelling of the vagina. It can happen when the normal bacteria and yeast in the vagina grow too much. There are many types.  Treatment will depend on the type you have.  Do not douche, use tampons , or have sex until your health  care provider approves. When you can return to sex, practice safe sex and use condoms. This information is not intended to replace advice given to you by your health care provider. Make sure you discuss any questions you have with your health care provider. Document Released: 06/29/2008 Document Revised: 03/15/2017 Document Reviewed: 04/24/2016 Elsevier Patient Education  2020 [employer]  

## 2019-01-02 ENCOUNTER — Ambulatory Visit: Payer: Medicaid Other | Admitting: Neurology

## 2019-01-02 ENCOUNTER — Other Ambulatory Visit: Payer: Self-pay

## 2019-01-02 NOTE — Progress Notes (Signed)
Rescheduled Botox.  Patient sick.

## 2019-01-07 ENCOUNTER — Other Ambulatory Visit: Payer: Self-pay

## 2019-01-07 ENCOUNTER — Ambulatory Visit (INDEPENDENT_AMBULATORY_CARE_PROVIDER_SITE_OTHER): Payer: Medicaid Other | Admitting: Neurology

## 2019-01-07 DIAGNOSIS — G43709 Chronic migraine without aura, not intractable, without status migrainosus: Secondary | ICD-10-CM

## 2019-01-07 MED ORDER — ONABOTULINUMTOXINA 100 UNITS IJ SOLR
155.0000 [IU] | Freq: Once | INTRAMUSCULAR | Status: AC
  Administered 2019-01-07: 155 [IU] via INTRAMUSCULAR

## 2019-01-07 NOTE — Progress Notes (Signed)
Pt supplied medication 45 units wasted

## 2019-01-07 NOTE — Progress Notes (Signed)
[facility]   Procedure Note Botox  Attending: Dr. Metta Clines  Preoperative Diagnosis(es): Chronic migraine  Consent obtained from: Patient Benefits discussed included, but were not limited to decreased muscle tightness, increased joint range of motion, and decreased pain.  Risk discussed included, but were not limited pain and discomfort, bleeding, bruising, excessive weakness, venous thrombosis, muscle atrophy and dysphagia.  Anticipated outcomes of the procedure as well as he risks and benefits of the alternatives to the procedure, and the roles and tasks of the personnel to be involved, were discussed with the patient, and the patient consents to the procedure and agrees to proceed. A copy of the patient medication guide was given to the patient which explains the blackbox warning.  Patients identity and treatment sites confirmed Yes  Details of Procedure: Skin was cleaned with alcohol. Prior to injection, the needle plunger was aspirated to make sure the needle was not within a blood vessel.  There was no blood retrieved on aspiration.    Following is a summary of the muscles injected  And the amount of Botulinum toxin used:  Dilution 200 units of Botox was reconstituted with 4 ml of preservative free normal saline. Time of reconstitution: At the time of the office visit (<30 minutes prior to injection)   Injections  155 total units of Botox was injected with a 30 gauge needle.  Injection Sites: L occipitalis: 15 units- 3 sites  R occiptalis: 15 units- 3 sites  L upper trapezius: 15 units- 3 sites R upper trapezius: 15 units- 3 sits          L paraspinal: 10 units- 2 sites R paraspinal: 10 units- 2 sites  Face L frontalis(2 injection sites):10 units   R frontalis(2 injection sites):10 units         L corrugator: 5 units   R corrugator: 5 units           Procerus: 5 units   L temporalis: 20 units R temporalis: 20 units   Agent:  200 units of botulinum Type A  (Onobotulinum Toxin type A) was reconstituted with 4 ml of preservative free normal saline.  Time of reconstitution: At the time of the office visit (<30 minutes prior to injection)     Total injected (Units): 155  Total wasted (Units): 45  Patient tolerated procedure well without complications.   Reinjection is anticipated in 3 months. Return to clinic in a month

## 2019-01-08 ENCOUNTER — Other Ambulatory Visit: Payer: Self-pay | Admitting: Internal Medicine

## 2019-01-08 DIAGNOSIS — Z1231 Encounter for screening mammogram for malignant neoplasm of breast: Secondary | ICD-10-CM

## 2019-02-05 ENCOUNTER — Telehealth: Payer: Self-pay | Admitting: *Deleted

## 2019-02-05 ENCOUNTER — Other Ambulatory Visit: Payer: Self-pay | Admitting: *Deleted

## 2019-02-05 DIAGNOSIS — R21 Rash and other nonspecific skin eruption: Secondary | ICD-10-CM

## 2019-02-05 MED ORDER — TRIAMCINOLONE ACETONIDE 0.025 % EX OINT: 1.0000 "application " | TOPICAL_OINTMENT | Freq: Two times a day (BID) | CUTANEOUS | 2 refills | Status: DC

## 2019-02-05 NOTE — Telephone Encounter (Signed)
Patient called in requesting Triamcinolone (Kenalog) that she was prescribed this time last year for contact dermatitis and she would like it refilled because she is starting to itch again... Informed her I would send msg and I do not know if this would require her to be seen in office to get the refill please advise.Marland KitchenMarland KitchenMarland Kitchen

## 2019-02-06 ENCOUNTER — Ambulatory Visit: Payer: Medicaid Other

## 2019-02-10 ENCOUNTER — Other Ambulatory Visit: Payer: Self-pay

## 2019-02-10 ENCOUNTER — Ambulatory Visit (INDEPENDENT_AMBULATORY_CARE_PROVIDER_SITE_OTHER): Payer: Medicaid Other | Admitting: Obstetrics & Gynecology

## 2019-02-10 ENCOUNTER — Ambulatory Visit: Payer: Medicaid Other | Admitting: Neurology

## 2019-02-10 ENCOUNTER — Encounter: Payer: Self-pay | Admitting: Obstetrics & Gynecology

## 2019-02-10 ENCOUNTER — Other Ambulatory Visit (HOSPITAL_COMMUNITY)
Admission: RE | Admit: 2019-02-10 | Discharge: 2019-02-10 | Disposition: A | Discharge status: Home / Self Care | Payer: Medicaid Other | Source: Ambulatory Visit | Attending: Obstetrics & Gynecology | Admitting: Obstetrics & Gynecology

## 2019-02-10 VITALS — BP 135/82 | HR 94 | Ht 71.0 in | Wt 195.0 lb

## 2019-02-10 DIAGNOSIS — N898 Other specified noninflammatory disorders of vagina: Secondary | ICD-10-CM | POA: Insufficient documentation

## 2019-02-10 DIAGNOSIS — B379 Candidiasis, unspecified: Secondary | ICD-10-CM

## 2019-02-10 DIAGNOSIS — B3731 Acute candidiasis of vulva and vagina: Secondary | ICD-10-CM

## 2019-02-10 DIAGNOSIS — B373 Candidiasis of vulva and vagina: Secondary | ICD-10-CM | POA: Diagnosis not present

## 2019-02-10 NOTE — Progress Notes (Signed)
   GYNECOLOGY OFFICE VISIT NOTE  History:   Christine Vega is a 48 y.o. [phone] here today for recurrent vulvar irritation x 2 weeks. Also reports some discharge. She denies any abnormal vaginal bleeding, pelvic pain or other concerns.    Past Medical History:  Diagnosis Date  . Anemia    history   . Anxiety   . Back pain    rotates to left hip and tailbone  . Depression   . DUB (dysfunctional uterine bleeding) 12/01/2010  . Endometriosis   . Fractured coccyx ([place])   . Heart murmur    no problems  . History of seasonal allergies   . Hyperlipidemia   . Migraine   . Migraines    tx w/depakote/verapamil per pt  . Murmur, heart   . 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 Post traumatic stress disorder (PTSD)   . Seizures ([place]) 2004   x 1; unknown source  . Uterine leiomyoma     Past Surgical History:  Procedure Laterality Date  . CHOLECYSTECTOMY    . HERNIA REPAIR    . HYSTERECTOMY ABDOMINAL WITH SALPINGECTOMY Left 02/07/2016   Procedure: HYSTERECTOMY ABDOMINAL WITH  left SALPINGECTOMY;  Surgeon: Mora Bellman, MD;  Location: [facility];  Service: Gynecology;  Laterality: Left;  . LAPAROSCOPY  05/16/2011   Procedure: LAPAROSCOPY OPERATIVE;  Surgeon: Emeterio Reeve, MD;  Location: [facility];  Service: Gynecology;  Laterality: N/A;  poss oophorectomy  . LAPAROSCOPY  08/06/2011   Procedure: LAPAROSCOPY OPERATIVE;  Surgeon: Woodroe Mode, MD;  Location: [facility];  Service: Gynecology;  Laterality: N/A;  . LAPAROTOMY     for endometriosis/adhesions  . SALPINGOOPHORECTOMY  08/06/2011   Procedure: SALPINGO OOPHERECTOMY;  Surgeon: Woodroe Mode, MD;  Location: [facility];  Service: Gynecology;  Laterality: Right;  . SVD     x 2  . TUBAL LIGATION    . WISDOM TOOTH EXTRACTION      The following portions of the patient's history were reviewed and updated as appropriate: allergies, current medications, past family history, past medical history, past social  history, past surgical history and problem list.    Review of Systems:  Pertinent items noted in HPI and remainder of comprehensive ROS otherwise negative.  Physical Exam:  BP 135/82   Pulse 94   Ht 5\' 11"  (1.803 m)   Wt 195 lb (88.5 kg)   LMP 01/13/2016 (Exact Date)   BMI 27.20 kg/m  CONSTITUTIONAL: Well-developed, well-nourished female in no acute distress.  PSYCHIATRIC: Normal mood and affect. Normal behavior. Normal judgment and thought content. CARDIOVASCULAR: Normal heart rate noted RESPIRATORY: Effort and breath sounds normal, no problems with respiration noted ABDOMEN: No masses noted. No other overt distention noted.   PELVIC: Normal appearing external genitalia; normal appearing vaginal mucosa and cervix. White discharge noted, testing sample obtained.  Normal uterine size, no other palpable masses, no uterine or adnexal tenderness.     Assessment and Plan:    1. Vaginal irritation Will follow up results and manage accordingly.  - Cervicovaginal ancillary only( [place])   Return for any gynecologic concerns.    Total face-to-face time with patient: 10 minutes.  Over 50% of encounter was spent on counseling and coordination of care.   Verita Schneiders, MD, [place] for [employer], [place]

## 2019-02-12 ENCOUNTER — Other Ambulatory Visit: Payer: Self-pay | Admitting: Neurology

## 2019-02-12 LAB — CERVICOVAGINAL ANCILLARY ONLY
Bacterial Vaginitis (gardnerella): NEGATIVE
Candida Glabrata: POSITIVE — AB
Candida Vaginitis: POSITIVE — AB
Chlamydia: NEGATIVE
Comment: NEGATIVE
Comment: NEGATIVE
Comment: NEGATIVE
Comment: NEGATIVE
Comment: NEGATIVE
Comment: NORMAL
Neisseria Gonorrhea: NEGATIVE
Trichomonas: NEGATIVE

## 2019-02-12 MED ORDER — BORIC ACID CRYS: 600.0000 mg | CRYSTALS | Freq: Every day | 2 refills | Status: DC

## 2019-02-12 MED ORDER — FLUCONAZOLE 150 MG PO TABS: 150.0000 mg | ORAL_TABLET | ORAL | 3 refills | Status: DC

## 2019-02-12 NOTE — Addendum Note (Signed)
Addended by: Verita Schneiders A on: 02/12/2019 06:02 PM   Modules accepted: Orders

## 2019-02-13 NOTE — Telephone Encounter (Signed)
Requested Prescriptions   Pending Prescriptions Disp Refills  . eletriptan (RELPAX) 40 MG tablet [Pharmacy Med Name: ELETRIPTAN HBR 40MG  TABLET] 10 tablet 2    Sig: TAKE 1 TABLET BY MOUTH AS NEEDED FOR MIGRAINE OR HEADACHE, MAY REPEAT IN 2 HOURS IF HEADACHE PERSISTS OR RECURS   Rx last filled:10/13/18 #10 3 refills  Pt last seen: 06/25/18  Follow up appt scheduled:03/04/19

## 2019-02-18 ENCOUNTER — Telehealth: Payer: Self-pay | Admitting: *Deleted

## 2019-02-18 MED ORDER — FLUCONAZOLE 150 MG PO TABS: 150.0000 mg | ORAL_TABLET | Freq: Once | ORAL | 1 refills | Status: AC

## 2019-02-18 NOTE — Telephone Encounter (Signed)
Pt called in regards to her diflucan, she was unaware that the med was sent into [facility] in [place]. Pt requesting dif[facility]an sent into [facility]

## 2019-02-23 ENCOUNTER — Telehealth: Payer: Self-pay | Admitting: Neurology

## 2019-02-23 ENCOUNTER — Other Ambulatory Visit: Payer: Self-pay

## 2019-02-23 ENCOUNTER — Other Ambulatory Visit: Payer: Self-pay | Admitting: Neurology

## 2019-02-23 ENCOUNTER — Ambulatory Visit
Admission: RE | Admit: 2019-02-23 | Discharge: 2019-02-23 | Disposition: A | Discharge status: Home / Self Care | Payer: Medicaid Other | Source: Ambulatory Visit | Attending: Internal Medicine | Admitting: Internal Medicine

## 2019-02-23 DIAGNOSIS — Z1231 Encounter for screening mammogram for malignant neoplasm of breast: Secondary | ICD-10-CM

## 2019-02-23 MED ORDER — ONDANSETRON HCL 4 MG PO TABS: 4.0000 mg | ORAL_TABLET | Freq: Three times a day (TID) | ORAL | 3 refills | Status: DC | PRN

## 2019-02-23 NOTE — Telephone Encounter (Signed)
Not on current medication  Are you ok with sending to pharmacy

## 2019-02-23 NOTE — Telephone Encounter (Signed)
Patient called regarding needing a Rx for Zofran? She uses [facility] on [address]. Her number is [phone]. Thank you

## 2019-02-23 NOTE — Telephone Encounter (Signed)
Called patient no answer left message Rx sent to pharmacy

## 2019-02-23 NOTE — Telephone Encounter (Signed)
Prescription for Zofran sent.

## 2019-03-02 NOTE — Progress Notes (Signed)
Virtual Visit via Video Note The purpose of this virtual visit is to provide medical care while limiting exposure to the novel coronavirus.    Consent was obtained for video visit:  Yes.   Answered questions that patient had about telehealth interaction:  Yes.   I discussed the limitations, risks, security and privacy concerns of performing an evaluation and management service by telemedicine. I also discussed with the patient that there may be a patient responsible charge related to this service. The patient expressed understanding and agreed to proceed.  Pt location: Home Physician Location: Home Name of referring provider:  [name], MD I connected with Si Gaul at patients initiation/request on 03/04/2019 at  8:30 AM EST by video enabled telemedicine application and verified that I am speaking with the correct person using two identifiers. Pt MRN:  [id number] Pt DOB:  [birth year] Video Participants:  Si Gaul   History of Present Illness:  Christine Vega is a 48 year old woman with migraines and chronic pain who follows up for migraines.  UPDATE: Intensity:  Moderate to severe Duration:  Cannot remember but Ubrelvy Frequency:  13 to 14 days a month Frequency of abortive medication:No more than 2 days a week Current NSAIDS: no Current analgesics: Fioricet, hydrocodone for back pain Current triptans/ergot:   None Current anti-emetic: Zofran 4mg ; promethazine 50mg  PR Current muscle relaxants: tizanidine 4mg  Current anti-anxiolytic: no Current sleep aide: no Current Antihypertensive medications: Propranolol ER 120mg  Current Antidepressant medications: bupropion, sertraline 150mg  Current Anticonvulsant medications: divalproex 500mg  24r twice daily, gabapentin 300mg  three times daily Current CGRP inhibitor:  Roselyn Meier (makes her feel queasy) Current Vitamins/Herbal/Supplements: D, E Current Antihistamines/Decongestants: hydroxyzine Other  therapy: Botox  Caffeine: Rarely coffee, tea, soda Alcohol: no Smoker: no Diet: Hydrates Exercise: yes Depression/anxiety: stable Sleep hygiene: poor   HISTORY: Onset: 48 years old Location: Either temple Quality: Throbbing, pressure Initial Intensity: 10/10 Aura: no Prodrome: no Associated symptoms: Nausea, vomiting, photophobia, phonophobia, osmophobia, blurred vision Initial Duration: 3 days  Initial Frequency: 16 headache days a month Triggers: Certain smells, chocolate , garlic Relieving factors: None Activity: Cannot function  Past NSAIDS: Ibuprofen, naproxen Past analgesics: Tylenol, oxycodone, Excedrin, Fioricet Past abortive triptans/ergot: Sumatriptan (tablet and injection), Maxalt, Zomig NS, Migranal NS, Relpax Past muscle relaxants: Flexeril Past anti-emetic: promethazine Past anti-anxiolytic: lorazepam Past antihypertensive medications: verapamil Past antidepressant medications: nortriptyline Past anticonvulsant medications: Topiramate, Lyrica Past vitamins/Herbal/Supplements: No Other therapy: no  Family history of headache: Mother, daughters  CT of head and cervical spine from 12/20/15 were personally reviewed and were negative for acute changes. Cervical spine did demonstrate degenerative disc disease at C5-6 and C6-7.  Past Medical History: Past Medical History:  Diagnosis Date  . Anemia    history   . Anxiety   . Back pain    rotates to left hip and tailbone  . Depression   . DUB (dysfunctional uterine bleeding) 12/01/2010  . Endometriosis   . Fractured coccyx ([place])   . Heart murmur    no problems  . History of seasonal allergies   . Hyperlipidemia   . Migraine   . Migraines    tx w/depakote/verapamil per pt  . Murmur, heart   . Neuromuscular disorder ([place])    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, walks/balance ok  . Post traumatic stress disorder (PTSD)   . Seizures ([place]) 2004   x 1; unknown source  . Uterine  leiomyoma     Medications: Outpatient Encounter Medications as of 03/04/2019  Medication  Sig  . Boric Acid CRYS Place 600 mg vaginally at bedtime for 14 days.  Marland Kitchen buPROPion (WELLBUTRIN XL) 150 MG 24 hr tablet Take 150 mg by mouth daily.  . butalbital-acetaminophen-caffeine (FIORICET, ESGIC) 50-325-40 MG tablet Take 1 tablet by mouth every 8 (eight) hours as needed for migraine.  . cetirizine (ZYRTEC) 10 MG tablet Take 10 mg by mouth daily as needed for allergies.  . cyclobenzaprine (FLEXERIL) 5 MG tablet Take 1 tablet (5 mg total) by mouth 3 (three) times daily as needed for muscle spasms.  . divalproex (DEPAKOTE) 500 MG DR tablet TAKE 2 TABLETS BY MOUTH TWICE DAILY  . eletriptan (RELPAX) 40 MG tablet TAKE 1 TABLET BY MOUTH AS NEEDED FOR MIGRAINE OR HEADACHE, MAY REPEAT IN 2 HOURS IF HEADACHE PERSISTS OR RECURS  . fluconazole (DIFLUCAN) 150 MG tablet Take 1 tablet (150 mg total) by mouth every 3 (three) days. For three doses  . gabapentin (NEURONTIN) 300 MG capsule Take 300 mg by mouth 3 (three) times daily.  Marland Kitchen HYDROcodone-acetaminophen (NORCO/VICODIN) 5-325 MG tablet TK 1 T PO Q 6 H  . hydrOXYzine (VISTARIL) 25 MG capsule Take 25 mg by mouth 3 (three) times daily as needed for anxiety.  . meloxicam (MOBIC) 15 MG tablet Take 1 tablet (15 mg total) by mouth daily.  . metroNIDAZOLE (FLAGYL) 500 MG tablet Take 1 tablet (500 mg total) by mouth 2 (two) times daily.  . naproxen (NAPROSYN) 500 MG tablet Take 1 tablet (500 mg total) by mouth every 12 (twelve) hours as needed.  . ondansetron (ZOFRAN) 4 MG tablet Take 1 tablet (4 mg total) by mouth every 8 (eight) hours as needed for nausea or vomiting.  . prochlorperazine (COMPAZINE) 10 MG tablet Take 10 mg by mouth every 6 (six) hours as needed for nausea or vomiting.  . promethazine (PROMETHEGAN) 50 MG suppository Place 50 mg rectally every 8 (eight) hours as needed for nausea or vomiting.   . propranolol ER (INDERAL LA) 120 MG 24 hr capsule Take 120  mg by mouth daily.  . sertraline (ZOLOFT) 100 MG tablet Take 100 mg by mouth daily.  . simvastatin (ZOCOR) 20 MG tablet TK 1 T PO D  . tiZANidine (ZANAFLEX) 4 MG tablet Take 4 mg by mouth every 12 (twelve) hours as needed for muscle spasms.   Marland Kitchen triamcinolone (KENALOG) 0.025 % ointment Apply 1 application topically 2 (two) times daily.  Marland Kitchen Ubrogepant (UBRELVY) 100 MG TABS Take 100 mg by mouth as needed. May repeat dose after 2 hours if needed.  Maximum 2 tablets in 24 hours   No facility-administered encounter medications on file as of 03/04/2019.     Allergies: Allergies  Allergen Reactions  . Penicillins Hives, Itching and Other (See Comments)    Has patient had a PCN reaction causing immediate rash, facial/tongue/throat swelling, SOB or lightheadedness with hypotension: No Has patient had a PCN reaction causing severe rash involving mucus membranes or skin necrosis: No Has patient had a PCN reaction that required hospitalization: No Has patient had a PCN reaction occurring within the last 10 years: Yes If all of the above answers are "NO", then may proceed with Cephalosporin use.   . Prednisone Hives and Itching  . Lipitor [Atorvastatin Calcium]   . Zithromax [Azithromycin] Hives and Itching    Family History: Family History  Problem Relation Age of Onset  . Migraines Mother   . Heart disease Father   . Migraines Father   . Migraines Daughter   .  Breast cancer Neg Hx     Social History: Social History   Socioeconomic History  . Marital status: Legally Separated    Spouse name: Not on file  . Number of children: Not on file  . Years of education: Not on file  . Highest education level: Not on file  Occupational History  . Not on file  Social Needs  . Financial resource strain: Not on file  . Food insecurity    Worry: Not on file    Inability: Not on file  . Transportation needs    Medical: Not on file    Non-medical: Not on file  Tobacco Use  . Smoking status:  Never Smoker  . Smokeless tobacco: Never Used  Substance and Sexual Activity  . Alcohol use: No    Alcohol/week: 0.0 standard drinks  . Drug use: No  . Sexual activity: Not Currently    Birth control/protection: Surgical  Lifestyle  . Physical activity    Days per week: Not on file    Minutes per session: Not on file  . Stress: Not on file  Relationships  . Social Herbalist on phone: Not on file    Gets together: Not on file    Attends religious service: Not on file    Active member of club or organization: Not on file    Attends meetings of clubs or organizations: Not on file    Relationship status: Not on file  . Intimate partner violence    Fear of current or ex partner: Not on file    Emotionally abused: Not on file    Physically abused: Not on file    Forced sexual activity: Not on file  Other Topics Concern  . Not on file  Social History Narrative  . Not on file    Observations/Objective:   Height 5\' 11"  (1.803 m), weight 195 lb (88.5 kg), last menstrual period 01/13/2016. No acute distress.  Alert and oriented.  Speech fluent and not dysarthric.  Language intact.  Eyes orthophoric on primary gaze.  Face symmetric.  Assessment and Plan:   Migraine without aura, without status migrainosus, not intractable.  Migraines are still frequent on 3 preventatives (Botox, Depakote, propranolol).  I would like to stop Botox and try Aimovig.  1.  For preventative management, stop Botox and start Aimovig 70mg  monthly.  Continue Depakote and propranolol 2.  For abortive therapy, will stop Ubrelvy due to intolerability and she will try samples of Nurtec.  Advised to stop Fioricet. 3.  Limit use of pain relievers to no more than 2 days out of week to prevent risk of rebound or medication-overuse headache. 4.  Keep headache diary 5.  Exercise, hydration, caffeine cessation, sleep hygiene, monitor for and avoid triggers 6.  Consider:  magnesium citrate 400mg  daily,  riboflavin 400mg  daily, and coenzyme Q10 100mg  three times daily 7. Follow up 4 months   Follow Up Instructions:    -I discussed the assessment and treatment plan with the patient. The patient was provided an opportunity to ask questions and all were answered. The patient agreed with the plan and demonstrated an understanding of the instructions.   The patient was advised to call back or seek an in-person evaluation if the symptoms worsen or if the condition fails to improve as anticipated.  Dudley Major, DO

## 2019-03-04 ENCOUNTER — Telehealth: Payer: Self-pay | Admitting: Neurology

## 2019-03-04 ENCOUNTER — Other Ambulatory Visit: Payer: Self-pay

## 2019-03-04 ENCOUNTER — Telehealth (INDEPENDENT_AMBULATORY_CARE_PROVIDER_SITE_OTHER): Payer: Medicaid Other | Admitting: Neurology

## 2019-03-04 ENCOUNTER — Encounter: Payer: Self-pay | Admitting: Neurology

## 2019-03-04 VITALS — Ht 71.0 in | Wt 195.0 lb

## 2019-03-04 DIAGNOSIS — Z79899 Other long term (current) drug therapy: Secondary | ICD-10-CM

## 2019-03-04 DIAGNOSIS — G43009 Migraine without aura, not intractable, without status migrainosus: Secondary | ICD-10-CM

## 2019-03-04 MED ORDER — AIMOVIG 70 MG/ML ~~LOC~~ SOAJ: 70.0000 mg | SUBCUTANEOUS | 11 refills | Status: DC

## 2019-03-04 MED ORDER — NURTEC 75 MG PO TBDP: 1.0000 | ORAL_TABLET | Freq: Every day | ORAL | 0 refills | Status: DC | PRN

## 2019-03-04 MED ORDER — AIMOVIG 70 MG/ML ~~LOC~~ SOAJ: 1.0000 "pen " | SUBCUTANEOUS | 0 refills | Status: DC

## 2019-03-04 NOTE — Addendum Note (Signed)
Addended by: Ranae Plumber on: 03/04/2019 09:10 AM   Modules accepted: Orders

## 2019-03-04 NOTE — Telephone Encounter (Signed)
Called patient she wanted to clarify her visit. She states that she never had Ubrevly filled. And she never received co pay card from back in 10/2018. She was confused about her medication.   She states that she has Medicaid and most of her medication she can get for $3. She will contact pharmacy to see how much it will cost with her Medicaid. She will call office back with update. But she would like to start this medication.   Please be aware

## 2019-03-04 NOTE — Telephone Encounter (Signed)
Patient called and said she has a question about her migraine medicine. She does not remember the name of it and said the nurse will know which one she means.  [facility] on [address]

## 2019-03-11 ENCOUNTER — Other Ambulatory Visit: Payer: Self-pay | Admitting: Neurology

## 2019-04-01 ENCOUNTER — Telehealth: Payer: Self-pay

## 2019-04-01 NOTE — Telephone Encounter (Signed)
Botox to be shipped tomorrow 04/02/2019 per [facility] [phone].

## 2019-04-02 NOTE — Telephone Encounter (Signed)
Received patient Botox in clinic today via fedex. Log into Botox book. In fridge for appt next weel.

## 2019-04-03 ENCOUNTER — Ambulatory Visit: Payer: Medicaid Other | Admitting: Neurology

## 2019-04-08 ENCOUNTER — Ambulatory Visit: Payer: Medicaid Other | Admitting: Neurology

## 2019-04-24 ENCOUNTER — Other Ambulatory Visit: Payer: Self-pay

## 2019-04-24 ENCOUNTER — Ambulatory Visit (INDEPENDENT_AMBULATORY_CARE_PROVIDER_SITE_OTHER): Payer: Medicaid Other | Admitting: Neurology

## 2019-04-24 DIAGNOSIS — G43009 Migraine without aura, not intractable, without status migrainosus: Secondary | ICD-10-CM | POA: Diagnosis not present

## 2019-04-24 DIAGNOSIS — G43809 Other migraine, not intractable, without status migrainosus: Secondary | ICD-10-CM | POA: Diagnosis not present

## 2019-04-24 MED ORDER — ONABOTULINUMTOXINA 100 UNITS IJ SOLR
200.0000 [IU] | Freq: Once | INTRAMUSCULAR | Status: AC
  Administered 2019-04-24: 200 [IU] via INTRAMUSCULAR

## 2019-04-24 NOTE — Progress Notes (Signed)
Botulinum Clinic  ° °Procedure Note Botox ° °Attending: Dr. Talah [place] ° °Preoperative Diagnosis(es): Chronic migraine ° °Consent obtained from: The patient °Benefits discussed included, but were not limited to decreased muscle tightness, increased joint range of motion, and decreased pain.  Risk discussed included, but were not limited pain and discomfort, bleeding, bruising, excessive weakness, venous thrombosis, muscle atrophy and dysphagia.  Anticipated outcomes of the procedure as well as he risks and benefits of the alternatives to the procedure, and the roles and tasks of the personnel to be involved, were discussed with the patient, and the patient consents to the procedure and agrees to proceed. A copy of the patient medication guide was given to the patient which explains the blackbox warning. ° °Patients identity and treatment sites confirmed Yes.  . ° °Details of Procedure: °Skin was cleaned with alcohol. Prior to injection, the needle plunger was aspirated to make sure the needle was not within a blood vessel.  There was no blood retrieved on aspiration.   ° °Following is a summary of the muscles injected  And the amount of Botulinum toxin used: ° °Dilution °200 units of Botox was reconstituted with 4 ml of preservative free normal saline. °Time of reconstitution: At the time of the office visit (<30 minutes prior to injection)  ° °Injections  °155 total units of Botox was injected with a 30 gauge needle. ° °Injection Sites: °L occipitalis: 15 units- 3 sites  °R occiptalis: 15 units- 3 sites ° °L upper trapezius: 15 units- 3 sites °R upper trapezius: 15 units- 3 sits          °L paraspinal: 10 units- 2 sites °R paraspinal: 10 units- 2 sites ° °Face °L frontalis(2 injection sites):10 units   °R frontalis(2 injection sites):10 units         °L corrugator: 5 units   °R corrugator: 5 units           °Procerus: 5 units   °L temporalis: 20 units °R temporalis: 20 units  ° °Agent:  °200 units of botulinum Type  A (Onobotulinum Toxin type A) was reconstituted with 4 ml of preservative free normal saline.  °Time of reconstitution: At the time of the office visit (<30 minutes prior to injection)  ° ° ° Total injected (Units):  155 ° Total wasted (Units):  45 ° °Patient tolerated procedure well without complications.   °Reinjection is anticipated in 3 months. ° ° °

## 2019-04-30 ENCOUNTER — Ambulatory Visit: Payer: Medicaid Other | Admitting: Obstetrics & Gynecology

## 2019-05-07 ENCOUNTER — Ambulatory Visit: Payer: Medicaid Other | Admitting: Obstetrics and Gynecology

## 2019-06-10 ENCOUNTER — Encounter: Payer: Self-pay | Admitting: *Deleted

## 2019-06-10 NOTE — Progress Notes (Addendum)
Called [place] [facility] spoke with Magge call ref# [id number] Sent for further review call in 24 hours for outcome [id number]  Called [place] [facility] [phone] spoke with [facility] call reference # [id number] Approved 06/10/2019-06/04/2020  Then called [place] (they initiated the PA) and relayed the PA info. She ran a claim and it went through.

## 2019-07-06 NOTE — Progress Notes (Signed)
Virtual Visit via Video Note The purpose of this virtual visit is to provide medical care while limiting exposure to the novel coronavirus.    Consent was obtained for video visit:  Yes.   Answered questions that patient had about telehealth interaction:  Yes.   I discussed the limitations, risks, security and privacy concerns of performing an evaluation and management service by telemedicine. I also discussed with the patient that there may be a patient responsible charge related to this service. The patient expressed understanding and agreed to proceed.  Pt location: Home Physician Location: office Name of referring provider:  [name], MD I connected with Christine Vega at patients initiation/request on 07/08/2019 at 10:30 AM EDT by video enabled telemedicine application and verified that I am speaking with the correct person using two identifiers. Pt MRN:  [id number] Pt DOB:  Jan 11, 1971 Video Participants:  Christine Vega   History of Present Illness:  Christine Vega is a 49 year old woman with migraines and chronic pain who follows up for migraines.  UPDATE: In November, plan was to stop Botox and start Aimovig.  She doesn't like the way it makes her feel.  She then decided to still continue with the Botox.  Roselyn Meier and eletriptan ineffective.  Right now, she is experiencing seasonal allergies. . Intensity:  severe Duration:  Cannot remember but Ubrelvy Frequency:  13 to 14 days a month Frequency of abortive medication:No more than 2 days a week Current NSAIDS: no Current analgesics: Fioricet, hydrocodone for back pain Current triptans/ergot:  None Current anti-emetic: Zofran 4mg ; promethazine 50mg  PR Current muscle relaxants: tizanidine 4mg  Current anti-anxiolytic: no Current sleep aide: no Current Antihypertensive medications: Propranolol ER 120mg  Current Antidepressant medications: bupropion, sertraline 150mg  Current Anticonvulsant medications:  divalproex 1000mg  24r twice daily, gabapentin 300mg  three times daily Current CGRP inhibitor:  Aimovig 70mg  (makes her feel weird); Roselyn Meier (makes her feel queasy, ineffective) Current Vitamins/Herbal/Supplements: D, E Current Antihistamines/Decongestants: hydroxyzine Other therapy: Botox  Caffeine: Rarely coffee, tea, soda Alcohol: no Smoker: no Diet: Hydrates Exercise: yes Depression/anxiety: stable Sleep hygiene: poor  HISTORY: Onset: 49 years old Location: Either temple Quality: Throbbing, pressure Initial Intensity: 10/10 Aura: no Prodrome: no Associated symptoms: Nausea, vomiting, photophobia, phonophobia, osmophobia, blurred vision Initial Duration: 3 days  Initial Frequency: 16 headache days a month Triggers: Certain smells, chocolate , garlic Relieving factors: None Activity: Cannot function  Past NSAIDS: Ibuprofen, naproxen Past analgesics: Tylenol, oxycodone, Excedrin, Fioricet Past abortive triptans/ergot: Sumatriptan (tablet and injection), Maxalt, Zomig NS, Migranal NS, Relpax Past muscle relaxants: Flexeril Past anti-emetic: promethazine Past anti-anxiolytic: lorazepam Past antihypertensive medications: verapamil Past antidepressant medications: nortriptyline Past anticonvulsant medications: Topiramate, Lyrica Past vitamins/Herbal/Supplements: No Other therapy: no  Family history of headache: Mother, daughters  CT of head and cervical spine from 12/20/15 were personally reviewed and were negative for acute changes. Cervical spine did demonstrate degenerative disc disease at C5-6 and C6-7.  Past Medical History: Past Medical History:  Diagnosis Date  . Anemia    history   . Anxiety   . Back pain    rotates to left hip and tailbone  . Depression   . DUB (dysfunctional uterine bleeding) 12/01/2010  . Endometriosis   . Fractured coccyx ([place])   . Heart murmur    no problems  . History of seasonal allergies   .  Hyperlipidemia   . Migraine   . Migraines    tx w/depakote/verapamil per pt  . Murmur, heart   . 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 Post traumatic stress disorder (PTSD)   . Seizures ([place]) 2004   x 1; unknown source  . Uterine leiomyoma     Medications: Outpatient Encounter Medications as of 07/08/2019  Medication Sig  . Boric Acid CRYS Place 600 mg vaginally at bedtime for 14 days.  Marland Kitchen buPROPion (WELLBUTRIN XL) 150 MG 24 hr tablet Take 150 mg by mouth daily.  . butalbital-acetaminophen-caffeine (FIORICET, ESGIC) 50-325-40 MG tablet Take 1 tablet by mouth every 8 (eight) hours as needed for migraine.  . cetirizine (ZYRTEC) 10 MG tablet Take 10 mg by mouth daily as needed for allergies.  . cyclobenzaprine (FLEXERIL) 5 MG tablet Take 1 tablet (5 mg total) by mouth 3 (three) times daily as needed for muscle spasms.  . divalproex (DEPAKOTE) 500 MG DR tablet TAKE 2 TABLETS BY MOUTH TWICE DAILY  . eletriptan (RELPAX) 40 MG tablet TAKE 1 TABLET BY MOUTH AS NEEDED FOR MIGRAINE OR HEADACHE, MAY REPEAT IN 2 HOURS IF HEADACHE PERSISTS OR RECURS  . Erenumab-aooe (AIMOVIG) 70 MG/ML SOAJ Inject 70 mg into the skin every 30 (thirty) days.  Christine Vega (AIMOVIG) 70 MG/ML SOAJ Inject 1 pen into the skin every 30 (thirty) days.  . fluconazole (DIFLUCAN) 150 MG tablet Take 1 tablet (150 mg total) by mouth every 3 (three) days. For three doses  . gabapentin (NEURONTIN) 300 MG capsule Take 300 mg by mouth 3 (three) times daily.  Marland Kitchen HYDROcodone-acetaminophen (NORCO/VICODIN) 5-325 MG tablet TK 1 T PO Q 6 H  . hydrOXYzine (VISTARIL) 25 MG capsule Take 25 mg by mouth 3 (three) times daily as needed for anxiety.  . meloxicam (MOBIC) 15 MG tablet Take 1 tablet (15 mg total) by mouth daily.  . metroNIDAZOLE (FLAGYL) 500 MG tablet Take 1 tablet (500 mg total) by mouth 2 (two) times daily.  . naproxen (NAPROSYN) 500 MG tablet Take 1 tablet (500 mg total) by mouth every 12  (twelve) hours as needed.  . ondansetron (ZOFRAN) 4 MG tablet Take 1 tablet (4 mg total) by mouth every 8 (eight) hours as needed for nausea or vomiting.  . prochlorperazine (COMPAZINE) 10 MG tablet Take 10 mg by mouth every 6 (six) hours as needed for nausea or vomiting.  . promethazine (PROMETHEGAN) 50 MG suppository Place 50 mg rectally every 8 (eight) hours as needed for nausea or vomiting.   . propranolol ER (INDERAL LA) 120 MG 24 hr capsule Take 120 mg by mouth daily.  . Rimegepant Sulfate (NURTEC) 75 MG TBDP Take 1 tablet by mouth daily as needed.  . sertraline (ZOLOFT) 100 MG tablet Take 100 mg by mouth daily.  . simvastatin (ZOCOR) 20 MG tablet TK 1 T PO D  . tiZANidine (ZANAFLEX) 4 MG tablet Take 4 mg by mouth every 12 (twelve) hours as needed for muscle spasms.   Marland Kitchen triamcinolone (KENALOG) 0.025 % ointment Apply 1 application topically 2 (two) times daily.  Marland Kitchen Ubrogepant (UBRELVY) 100 MG TABS Take 100 mg by mouth as needed. May repeat dose after 2 hours if needed.  Maximum 2 tablets in 24 hours   No facility-administered encounter medications on file as of 07/08/2019.    Allergies: Allergies  Allergen Reactions  . Penicillins Hives, Itching and Other (See Comments)    Has patient had a PCN reaction causing immediate rash, facial/tongue/throat swelling, SOB or lightheadedness with hypotension: No Has patient had a PCN reaction causing severe rash involving mucus membranes or skin necrosis: No Has  patient had a PCN reaction that required hospitalization: No Has patient had a PCN reaction occurring within the last 10 years: Yes If all of the above answers are "NO", then may proceed with Cephalosporin use.   . Prednisone Hives and Itching  . Lipitor [Atorvastatin Calcium]   . Zithromax [Azithromycin] Hives and Itching    Family History: Family History  Problem Relation Age of Onset  . Migraines Mother   . Heart disease Father   . Migraines Father   . Migraines Daughter   .  Breast cancer Neg Hx     Social History: Social History   Socioeconomic History  . Marital status: Legally Separated    Spouse name: Not on file  . Number of children: 2  . Years of education: Not on file  . Highest education level: High school graduate  Occupational History  . Not on file  Tobacco Use  . Smoking status: Never Smoker  . Smokeless tobacco: Never Used  Substance and Sexual Activity  . Alcohol use: No    Alcohol/week: 0.0 standard drinks  . Drug use: No  . Sexual activity: Not Currently    Birth control/protection: Surgical  Other Topics Concern  . Not on file  Social History Narrative   Pt is single she lives with father of children   She has 2 children   She is right hand   Drinks: ginger ale, no tea no soda, no coffee   Social Determinants of Radio broadcast assistant Strain:   . Difficulty of Paying Living Expenses:   Food Insecurity:   . Worried About Charity fundraiser in the Last Year:   . Arboriculturist in the Last Year:   Transportation Needs:   . Film/video editor (Medical):   Marland Kitchen Lack of Transportation (Non-Medical):   Physical Activity:   . Days of Exercise per Week:   . Minutes of Exercise per Session:   Stress:   . Feeling of Stress :   Social Connections:   . Frequency of Communication with Friends and Family:   . Frequency of Social Gatherings with Friends and Family:   . Attends Religious Services:   . Active Member of Clubs or Organizations:   . Attends Archivist Meetings:   Marland Kitchen Marital Status:   Intimate Partner Violence:   . Fear of Current or Ex-Partner:   . Emotionally Abused:   Marland Kitchen Physically Abused:   . Sexually Abused:     Observations/Objective:   Last menstrual period 01/13/2016. No acute distress.    Assessment and Plan:   Chronic migraine without aura, without status migrainosus, not intractable  1.  For preventative management, continue Botox.  Stop Aimovig.  I will also have her taper off of  Depakote as it likely is not effective and as it is, she is on a lot of medications:  She will take 1 tablet twice daily for a week, then 1 tablet at bedtime for a week, then STOP.  Eventually, I would like her to taper off propranolol as well. 2.  For abortive therapy, she will try Nurtec. 3.  Limit use of pain relievers to no more than 2 days out of week to prevent risk of rebound or medication-overuse headache. 4.  Keep headache diary 5.  Exercise, hydration, caffeine cessation, sleep hygiene, monitor for and avoid triggers 6. Follow up for Botox (4/9)   Follow Up Instructions:    -I discussed the assessment and treatment plan  with the patient. The patient was provided an opportunity to ask questions and all were answered. The patient agreed with the plan and demonstrated an understanding of the instructions.   The patient was advised to call back or seek an in-person evaluation if the symptoms worsen or if the condition fails to improve as anticipated.   Dudley Major, DO

## 2019-07-08 ENCOUNTER — Other Ambulatory Visit: Payer: Self-pay

## 2019-07-08 ENCOUNTER — Telehealth (INDEPENDENT_AMBULATORY_CARE_PROVIDER_SITE_OTHER): Payer: Medicaid Other | Admitting: Neurology

## 2019-07-08 DIAGNOSIS — G43709 Chronic migraine without aura, not intractable, without status migrainosus: Secondary | ICD-10-CM | POA: Diagnosis not present

## 2019-07-08 MED ORDER — NURTEC 75 MG PO TBDP: 75.0000 mg | ORAL_TABLET | ORAL | 0 refills | Status: DC | PRN

## 2019-07-08 NOTE — Addendum Note (Signed)
Addended by: Jake Seats on: 07/08/2019 11:06 AM   Modules accepted: Orders

## 2019-07-14 ENCOUNTER — Telehealth: Payer: Self-pay

## 2019-07-14 NOTE — Telephone Encounter (Signed)
Received an Eletriptan refill request from [employer] [facility].  Per last office note this medication was ineffective.  Left message for patient to call back to discuss refill.

## 2019-07-21 ENCOUNTER — Telehealth: Payer: Self-pay | Admitting: *Deleted

## 2019-07-21 NOTE — Telephone Encounter (Signed)
Called yesterday to let her know that [facility] no longer ships Botox. After checking with them they said no they will not. I offered [facility] specialty pharmacy or [facility]. She had forgotten her appointment coming up this Friday 07/24/2019. She needs to go to a funeral in [place] and wanted to delay the appointment. I asked Dr. Tomi Likens and he said it was fine. I called her back and she did not answer so I left a message that we need to get the Botox ordered. I spoke to [facility] and they said [facility] would work. I told her I need to get the prescription set up and she needed to submit the copay so it can be shipped overnight.  In the mean time Hinton Dyer called her to set up an appointment at a later date and she did not find one to suit her, they were all more than 6 weeks out.  Today I called again to see where we are on this and she did not answer so I left her a voicemail asking her to call back. At this point the only route we have available if she still wants the Friday apointment is to use [facility] and she will need to pick it up on her way to the appointment but we will still need to send the prescription there.

## 2019-07-24 ENCOUNTER — Ambulatory Visit: Payer: Medicaid Other | Admitting: Neurology

## 2019-07-29 NOTE — Progress Notes (Signed)
Pt never came to pick up her nurtec sample she had 2 sets up front both returned to stock .

## 2019-08-17 ENCOUNTER — Encounter: Payer: Self-pay | Admitting: *Deleted

## 2019-08-17 NOTE — Progress Notes (Signed)
Confirmation [id number] WPrior Approval #:Status:SUSPENDED Submitted Via [facility]

## 2019-08-18 ENCOUNTER — Other Ambulatory Visit: Payer: Self-pay | Admitting: Neurology

## 2019-08-28 ENCOUNTER — Other Ambulatory Visit: Payer: Self-pay

## 2019-08-28 ENCOUNTER — Ambulatory Visit (INDEPENDENT_AMBULATORY_CARE_PROVIDER_SITE_OTHER): Payer: Medicaid Other | Admitting: Neurology

## 2019-08-28 DIAGNOSIS — G43709 Chronic migraine without aura, not intractable, without status migrainosus: Secondary | ICD-10-CM

## 2019-08-28 DIAGNOSIS — G43009 Migraine without aura, not intractable, without status migrainosus: Secondary | ICD-10-CM

## 2019-08-28 MED ORDER — BOTOX 200 UNITS IJ SOLR: 200.0000 [IU] | Freq: Once | INTRAMUSCULAR | 0 refills | Status: AC

## 2019-08-28 NOTE — Progress Notes (Signed)
[facility]   Procedure Note Botox  Attending: Dr. Metta Clines  Preoperative Diagnosis(es): Chronic migraine  Consent obtained from: The patient Benefits discussed included, but were not limited to decreased muscle tightness, increased joint range of motion, and decreased pain.  Risk discussed included, but were not limited pain and discomfort, bleeding, bruising, excessive weakness, venous thrombosis, muscle atrophy and dysphagia.  Anticipated outcomes of the procedure as well as he risks and benefits of the alternatives to the procedure, and the roles and tasks of the personnel to be involved, were discussed with the patient, and the patient consents to the procedure and agrees to proceed. A copy of the patient medication guide was given to the patient which explains the blackbox warning.  Patients identity and treatment sites confirmed Yes.  .  Details of Procedure: Skin was cleaned with alcohol. Prior to injection, the needle plunger was aspirated to make sure the needle was not within a blood vessel.  There was no blood retrieved on aspiration.    Following is a summary of the muscles injected  And the amount of Botulinum toxin used:  Dilution 200 units of Botox was reconstituted with 4 ml of preservative free normal saline. Time of reconstitution: At the time of the office visit (<30 minutes prior to injection)   Injections  155 total units of Botox was injected with a 30 gauge needle.  Injection Sites: L occipitalis: 15 units- 3 sites  R occiptalis: 15 units- 3 sites  L upper trapezius: 15 units- 3 sites R upper trapezius: 15 units- 3 sits          L paraspinal: 10 units- 2 sites R paraspinal: 10 units- 2 sites  Face L frontalis(2 injection sites):10 units   R frontalis(2 injection sites):10 units         L corrugator: 5 units   R corrugator: 5 units           Procerus: 5 units   L temporalis: 20 units R temporalis: 20 units   Agent:  200 units of botulinum Type  A (Onobotulinum Toxin type A) was reconstituted with 4 ml of preservative free normal saline.  Time of reconstitution: At the time of the office visit (<30 minutes prior to injection)     Total injected (Units): 155  Total wasted (Units): 45  Patient tolerated procedure well without complications.   Reinjection is anticipated in 3 months. Return to clinic in 4 1/2 months.

## 2019-09-10 ENCOUNTER — Other Ambulatory Visit: Payer: Self-pay | Admitting: Neurology

## 2019-09-16 ENCOUNTER — Other Ambulatory Visit: Payer: Self-pay | Admitting: Neurology

## 2019-11-11 ENCOUNTER — Encounter: Payer: Self-pay | Admitting: Neurology

## 2019-11-11 NOTE — Progress Notes (Signed)
Patient gave new [place] [employer] insurance info.   Submitted BV through [facility]; awaiting determination about PA and supplier.

## 2019-11-13 NOTE — Progress Notes (Addendum)
BV came back needing PA for Well [employer] insurance. OK to use buy and bill or spec [facility] @ [phone].   Approval valid from 11/27/19 to 01/26/20.

## 2019-11-19 ENCOUNTER — Telehealth: Payer: Self-pay | Admitting: *Deleted

## 2019-11-19 MED ORDER — METRONIDAZOLE 500 MG PO TABS: 500.0000 mg | ORAL_TABLET | Freq: Two times a day (BID) | ORAL | 0 refills | Status: DC

## 2019-11-19 NOTE — Telephone Encounter (Signed)
Pt reguesting meds for BV

## 2019-11-24 ENCOUNTER — Telehealth: Payer: Self-pay | Admitting: Neurology

## 2019-11-24 NOTE — Telephone Encounter (Signed)
Patient called in and needed to reschedule her botox from 11/27/19 to 12/25/19. She would like to find out if it will "throw her off".

## 2019-11-25 ENCOUNTER — Ambulatory Visit: Payer: Self-pay | Admitting: Family Medicine

## 2019-11-26 NOTE — Telephone Encounter (Signed)
Everybody responds differently.  It may or it may not.

## 2019-11-26 NOTE — Telephone Encounter (Signed)
LMOVM

## 2019-11-27 ENCOUNTER — Ambulatory Visit: Payer: Medicaid Other | Admitting: Neurology

## 2019-12-01 ENCOUNTER — Ambulatory Visit: Payer: Self-pay | Admitting: Obstetrics and Gynecology

## 2019-12-01 ENCOUNTER — Encounter: Payer: Self-pay | Admitting: Obstetrics and Gynecology

## 2019-12-02 NOTE — Progress Notes (Signed)
Patient did not keep her GYN annual visit appointment for 12/01/2019.  Durene Romans MD Attending Center for [employer] Fish farm manager)

## 2019-12-17 ENCOUNTER — Ambulatory Visit: Payer: Medicaid Other | Admitting: Obstetrics & Gynecology

## 2019-12-23 ENCOUNTER — Ambulatory Visit: Payer: Medicaid Other | Admitting: Student

## 2019-12-25 ENCOUNTER — Ambulatory Visit (INDEPENDENT_AMBULATORY_CARE_PROVIDER_SITE_OTHER): Payer: Medicaid Other | Admitting: Neurology

## 2019-12-25 ENCOUNTER — Other Ambulatory Visit: Payer: Self-pay

## 2019-12-25 DIAGNOSIS — G43709 Chronic migraine without aura, not intractable, without status migrainosus: Secondary | ICD-10-CM | POA: Diagnosis not present

## 2019-12-25 MED ORDER — ONABOTULINUMTOXINA 100 UNITS IJ SOLR
160.0000 [IU] | Freq: Once | INTRAMUSCULAR | Status: AC
  Administered 2019-12-25: 150 [IU] via INTRAMUSCULAR

## 2019-12-25 NOTE — Progress Notes (Signed)
[facility]   Procedure Note Botox  Attending: Dr. Metta Clines  Preoperative Diagnosis(es): Chronic migraine  Consent obtained from: The patient Benefits discussed included, but were not limited to decreased muscle tightness, increased joint range of motion, and decreased pain.  Risk discussed included, but were not limited pain and discomfort, bleeding, bruising, excessive weakness, venous thrombosis, muscle atrophy and dysphagia.  Anticipated outcomes of the procedure as well as he risks and benefits of the alternatives to the procedure, and the roles and tasks of the personnel to be involved, were discussed with the patient, and the patient consents to the procedure and agrees to proceed. A copy of the patient medication guide was given to the patient which explains the blackbox warning.  Patients identity and treatment sites confirmed Yes.  .  Details of Procedure: Skin was cleaned with alcohol. Prior to injection, the needle plunger was aspirated to make sure the needle was not within a blood vessel.  There was no blood retrieved on aspiration.    Following is a summary of the muscles injected  And the amount of Botulinum toxin used:  Dilution 200 units of Botox was reconstituted with 4 ml of preservative free normal saline. Time of reconstitution: At the time of the office visit (<30 minutes prior to injection)   Injections  155 total units of Botox was injected with a 30 gauge needle.  Injection Sites: L occipitalis: 15 units- 3 sites  R occiptalis: 15 units- 3 sites  L upper trapezius: 15 units- 3 sites R upper trapezius: 15 units- 3 sits          L paraspinal: 10 units- 2 sites R paraspinal: 10 units- 2 sites  Face L frontalis(2 injection sites):10 units   R frontalis(2 injection sites):10 units         L corrugator: 5 units   R corrugator: 5 units           Procerus: 0.  Declined due to rash.   L temporalis: 20 units R temporalis: 20 units   Agent:  200 units  of botulinum Type A (Onobotulinum Toxin type A) was reconstituted with 4 ml of preservative free normal saline.  Time of reconstitution: At the time of the office visit (<30 minutes prior to injection)     Total injected (Units): 150  Total wasted (Units): 10  Patient tolerated procedure well without complications.   Reinjection is anticipated in 3 months.

## 2019-12-30 ENCOUNTER — Encounter: Payer: Self-pay | Admitting: Family Medicine

## 2019-12-30 ENCOUNTER — Ambulatory Visit (INDEPENDENT_AMBULATORY_CARE_PROVIDER_SITE_OTHER): Payer: Medicaid Other | Admitting: Family Medicine

## 2019-12-30 ENCOUNTER — Other Ambulatory Visit: Payer: Self-pay

## 2019-12-30 ENCOUNTER — Other Ambulatory Visit (HOSPITAL_COMMUNITY)
Admission: RE | Admit: 2019-12-30 | Discharge: 2019-12-30 | Disposition: A | Discharge status: Home / Self Care | Payer: Medicaid Other | Source: Ambulatory Visit | Attending: Obstetrics & Gynecology | Admitting: Obstetrics & Gynecology

## 2019-12-30 VITALS — BP 127/75 | HR 97 | Ht 71.5 in | Wt 208.0 lb

## 2019-12-30 DIAGNOSIS — E559 Vitamin D deficiency, unspecified: Secondary | ICD-10-CM

## 2019-12-30 DIAGNOSIS — B373 Candidiasis of vulva and vagina: Secondary | ICD-10-CM

## 2019-12-30 DIAGNOSIS — Z01419 Encounter for gynecological examination (general) (routine) without abnormal findings: Secondary | ICD-10-CM

## 2019-12-30 DIAGNOSIS — N898 Other specified noninflammatory disorders of vagina: Secondary | ICD-10-CM

## 2019-12-30 DIAGNOSIS — B379 Candidiasis, unspecified: Secondary | ICD-10-CM

## 2019-12-30 DIAGNOSIS — B3731 Acute candidiasis of vulva and vagina: Secondary | ICD-10-CM

## 2019-12-30 DIAGNOSIS — Z113 Encounter for screening for infections with a predominantly sexual mode of transmission: Secondary | ICD-10-CM

## 2019-12-30 MED ORDER — FLUCONAZOLE 150 MG PO TABS: 150.0000 mg | ORAL_TABLET | Freq: Every day | ORAL | 2 refills | Status: DC

## 2019-12-30 MED ORDER — BORIC ACID CRYS: 600.0000 mg | CRYSTALS | 2 refills | Status: DC

## 2019-12-30 NOTE — Patient Instructions (Signed)

## 2019-12-30 NOTE — Progress Notes (Signed)
  Subjective:     Christine Vega is a 49 y.o. female and is here for a comprehensive physical exam. The patient reports problems - Abnormal vaginal discharge.  Patient is status post hysterectomy and does not require Pap smears.  Reports white discharge with odor. Has associated vaginal irritation.   The following portions of the patient's history were reviewed and updated as appropriate: allergies, current medications, past family history, past medical history, past social history, past surgical history and problem list.  Review of Systems Pertinent items noted in HPI and remainder of comprehensive ROS otherwise negative.   Objective:    BP 127/75   Pulse 97   Ht 5' 11.5" (1.816 m)   Wt 208 lb (94.3 kg)   LMP 01/13/2016 (Exact Date)   BMI 28.61 kg/m  General appearance: alert, cooperative and appears stated age Head: Normocephalic, without obvious abnormality, atraumatic Neck: no adenopathy, supple, symmetrical, trachea midline and thyroid not enlarged, symmetric, no tenderness/mass/nodules Lungs: clear to auscultation bilaterally Breasts: normal appearance, no masses or tenderness Heart: regular rate and rhythm, S1, S2 normal, no murmur, click, rub or gallop Abdomen: soft, non-tender; bowel sounds normal; no masses,  no organomegaly Pelvic: external genitalia normal, no adnexal masses or tenderness, no cervical motion tenderness, rectovaginal septum normal, uterus surgically absent and Clumpy white discharge noted Extremities: Homans sign is negative, no sign of DVT Pulses: 2+ and symmetric Skin: Skin color, texture, turgor normal. No rashes or lesions Lymph nodes: Cervical, supraclavicular, and axillary nodes normal. Neurologic: Grossly normal    Assessment:    Healthy female exam.      Plan:     Encounter for gynecological examination without abnormal finding - Plan: MM 3D SCREEN BREAST BILATERAL, CBC, TSH, Hemoglobin A1c, Comprehensive metabolic panel, Lipid panel,  VITAMIN D 25 Hydroxy (Vit-D Deficiency, Fractures)  Screen for STD (sexually transmitted disease) - Plan: Hepatitis B surface antigen, Hepatitis C antibody, HIV Antibody (routine testing w rflx), RPR  Vaginal irritation - Plan: Cervicovaginal ancillary only  Infection due to Candida glabrata - Plan: Boric Acid CRYS, fluconazole (DIFLUCAN) 150 MG tablet  Candidal vulvovaginitis - Plan: Boric Acid CRYS  Vitamin D deficiency - Plan: Vitamin D, Ergocalciferol, (DRISDOL) 1.25 MG (50000 UNIT) CAPS capsule  Return in 1 year (on 12/29/2020).  See After Visit Summary for Counseling Recommendations

## 2019-12-31 LAB — COMPREHENSIVE METABOLIC PANEL
ALT: 14 IU/L (ref 0–32)
AST: 18 IU/L (ref 0–40)
Albumin/Globulin Ratio: 1.8 (ref 1.2–2.2)
Albumin: 4.9 g/dL (ref 3.8–4.9)
Alkaline Phosphatase: 98 IU/L (ref 44–121)
BUN/Creatinine Ratio: 11 (ref 9–23)
BUN: 8 mg/dL (ref 6–24)
Bilirubin Total: 0.3 mg/dL (ref 0.0–1.2)
CO2: 25 mmol/L (ref 20–29)
Calcium: 10.2 mg/dL (ref 8.7–10.2)
Chloride: 96 mmol/L (ref 96–106)
Creatinine, Ser: 0.72 mg/dL (ref 0.57–1.00)
GFR calc Af Amer: 112 mL/min/{1.73_m2} (ref >59)
GFR calc non Af Amer: 97 mL/min/{1.73_m2} (ref >59)
Globulin, Total: 2.7 g/dL (ref 1.5–4.5)
Glucose: 77 mg/dL (ref 65–99)
Potassium: 4.1 mmol/L (ref 3.5–5.2)
Sodium: 138 mmol/L (ref 134–144)
Total Protein: 7.6 g/dL (ref 6.0–8.5)

## 2019-12-31 LAB — CBC
Hematocrit: 39.8 % (ref 34.0–46.6)
Hemoglobin: 13.2 g/dL (ref 11.1–15.9)
MCH: 28.4 pg (ref 26.6–33.0)
MCHC: 33.2 g/dL (ref 31.5–35.7)
MCV: 86 fL (ref 79–97)
Platelets: 298 10*3/uL (ref 150–450)
RBC: 4.64 x10E6/uL (ref 3.77–5.28)
RDW: 14.7 % (ref 11.7–15.4)
WBC: 6.1 10*3/uL (ref 3.4–10.8)

## 2019-12-31 LAB — LIPID PANEL
Chol/HDL Ratio: 4.7 ratio — ABNORMAL HIGH (ref 0.0–4.4)
Cholesterol, Total: 251 mg/dL — ABNORMAL HIGH (ref 100–199)
HDL: 53 mg/dL (ref >39)
LDL Chol Calc (NIH): 158 mg/dL — ABNORMAL HIGH (ref 0–99)
Triglycerides: 221 mg/dL — ABNORMAL HIGH (ref 0–149)
VLDL Cholesterol Cal: 40 mg/dL (ref 5–40)

## 2019-12-31 LAB — HEMOGLOBIN A1C
Est. average glucose Bld gHb Est-mCnc: 126 mg/dL
Hgb A1c MFr Bld: 6 % — ABNORMAL HIGH (ref 4.8–5.6)

## 2019-12-31 LAB — VITAMIN D 25 HYDROXY (VIT D DEFICIENCY, FRACTURES): Vit D, 25-Hydroxy: 18 ng/mL — ABNORMAL LOW (ref 30.0–100.0)

## 2019-12-31 LAB — HEPATITIS C ANTIBODY: Hep C Virus Ab: <0.1 s/co ratio (ref 0.0–0.9)

## 2019-12-31 LAB — RPR: RPR Ser Ql: NONREACTIVE

## 2019-12-31 LAB — TSH: TSH: 1.9 u[IU]/mL (ref 0.450–4.500)

## 2019-12-31 LAB — HIV ANTIBODY (ROUTINE TESTING W REFLEX): HIV Screen 4th Generation wRfx: NONREACTIVE

## 2019-12-31 LAB — HEPATITIS B SURFACE ANTIGEN: Hepatitis B Surface Ag: NEGATIVE

## 2019-12-31 MED ORDER — VITAMIN D (ERGOCALCIFEROL) 1.25 MG (50000 UNIT) PO CAPS: 50000.0000 [IU] | ORAL_CAPSULE | ORAL | 0 refills | Status: AC

## 2020-01-01 LAB — CERVICOVAGINAL ANCILLARY ONLY
Bacterial Vaginitis (gardnerella): NEGATIVE
Candida Glabrata: POSITIVE — AB
Candida Vaginitis: NEGATIVE
Chlamydia: NEGATIVE
Comment: NEGATIVE
Comment: NEGATIVE
Comment: NEGATIVE
Comment: NEGATIVE
Comment: NEGATIVE
Comment: NORMAL
Neisseria Gonorrhea: NEGATIVE
Trichomonas: NEGATIVE

## 2020-02-09 NOTE — Progress Notes (Signed)
Virtual Visit via Video Note The purpose of this virtual visit is to provide medical care while limiting exposure to the novel coronavirus.    Consent was obtained for video visit:  Yes.   Answered questions that patient had about telehealth interaction:  Yes.   I discussed the limitations, risks, security and privacy concerns of performing an evaluation and management service by telemedicine. I also discussed with the patient that there may be a patient responsible charge related to this service. The patient expressed understanding and agreed to proceed.  Pt location: Home Physician Location: office Name of referring provider:  [name], MD I connected with Christine Vega at patients initiation/request on 02/11/2020 at  3:30 PM EDT by video enabled telemedicine application and verified that I am speaking with the correct person using two identifiers. Pt MRN:  [id number] Pt DOB:  [birth year] Video Participants:  Christine Vega   History of Present Illness:   Christine Vega is a49year old woman with migraines and chronic pain who follows up for migraine.  UPDATE: Stopped Aimovig.  Tapered off of Depakote.  On Botox, status post 2 rounds. No improvement Intensity: severe Duration:several hours Frequency: 13 to 14 days a month Frequency of abortive medication:No more than 2 days a week Current NSAIDS: Mobic Current analgesics: Fioricet Current triptans/ergot: None Current anti-emetic: Zofran 4mg ;Compazine 10mg  Current muscle relaxants: tizanidine 4mg  Current anti-anxiolytic: no Current sleep aide: no Current Antihypertensive medications: Propranolol ER 120mg  Current Antidepressant medications: bupropion, sertraline 100mg  Current Anticonvulsant medications:gabapentin 300mg  three times daily, lamotrigine 100mg  daily Current CGRP inhibitor: none Current Vitamins/Herbal/Supplements: D, E Current Antihistamines/Decongestants: hydroxyzine Other  therapy: Botox  Caffeine: Rarely coffee, tea, soda Alcohol: no Smoker: no Diet: Hydrates Exercise: yes Depression/anxiety: stable Sleep hygiene: poor  HISTORY: Onset: 49 years old Location: Either temple Quality: Throbbing, pressure Initial Intensity: 10/10 Aura: no Prodrome: no Associated symptoms: Nausea, vomiting, photophobia, phonophobia, osmophobia, blurred vision Initial Duration: 3 days  Initial Frequency: 16 headache days a month Triggers: Certain smells, chocolate , garlic Relieving factors: None Activity: Cannot function  Past NSAIDS: Ibuprofen, naproxen Past analgesics: Tylenol, oxycodone, Excedrin, Fioricet Past abortive triptans/ergot: Sumatriptan (tablet and injection), Maxalt, Zomig NS, Migranal NS, Relpax Past muscle relaxants: Flexeril Past anti-emetic: promethazine 50mg  PR Past anti-anxiolytic: lorazepam Past antihypertensive medications: verapamil Past antidepressant medications: nortriptyline Past anticonvulsant medications: Topiramate, Lyrica Past CGRP inhibitor:  Aimovig 70mg  (makes her feel weird); Roselyn Meier (makes her feel queasy, ineffective), Nurtec Past vitamins/Herbal/Supplements: No Other therapy: no  Family history of headache: Mother, daughters  CT of head and cervical spine from 12/20/15 were personally reviewed and were negative for acute changes. Cervical spine did demonstrate degenerative disc disease at C5-6 and C6-7.    Past Medical History: Past Medical History:  Diagnosis Date  . Anemia    history   . Anxiety   . Back pain    rotates to left hip and tailbone  . Depression   . DUB (dysfunctional uterine bleeding) 12/01/2010  . Endometriosis   . Fractured coccyx ([place])   . Heart murmur    no problems  . History of seasonal allergies   . Hyperlipidemia   . Migraine   . Migraines    tx w/depakote/verapamil per pt  . Murmur, heart   . 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 Post traumatic stress disorder (PTSD)   . Seizures ([place]) 2004   x 1; unknown source  . Uterine leiomyoma  Medications: Outpatient Encounter Medications as of 02/11/2020  Medication Sig  . AMITIZA 24 MCG capsule Take 24 mcg by mouth 2 (two) times daily.  . Boric Acid CRYS Place 600 mg vaginally 2 (two) times a week.  Marland Kitchen BOTOX 200 units SOLR INJECT 200 UNITS AS DIRECTED ONCE FOR 1 DOSE.  Marland Kitchen buPROPion (WELLBUTRIN XL) 150 MG 24 hr tablet Take 150 mg by mouth daily.  . butalbital-acetaminophen-caffeine (FIORICET, ESGIC) 50-325-40 MG tablet Take 1 tablet by mouth every 8 (eight) hours as needed for migraine.  . cyclobenzaprine (FLEXERIL) 5 MG tablet Take 1 tablet (5 mg total) by mouth 3 (three) times daily as needed for muscle spasms.  Marland Kitchen eletriptan (RELPAX) 40 MG tablet TAKE 1 TABLET BY MOUTH AS NEEDED FOR MIGRAINE OR HEADACHE, MAY REPEAT IN 2 HOURS IF HEADACHE PERSISTS OR RECURS  . fluconazole (DIFLUCAN) 150 MG tablet Take 1 tablet (150 mg total) by mouth daily. Repeat in 24 hours if needed  . fluticasone (FLONASE) 50 MCG/ACT nasal spray SHAKE LIQUID AND USE 2 SPRAYS IN EACH NOSTRIL DAILY  . gabapentin (NEURONTIN) 300 MG capsule Take 300 mg by mouth 3 (three) times daily.  . hydrOXYzine (ATARAX/VISTARIL) 50 MG tablet Take 50 mg by mouth 3 (three) times daily as needed.  . lamoTRIgine (LAMICTAL) 100 MG tablet Take 100 mg by mouth daily.  . meloxicam (MOBIC) 15 MG tablet Take 1 tablet (15 mg total) by mouth daily.  . naproxen (NAPROSYN) 500 MG tablet Take 1 tablet (500 mg total) by mouth every 12 (twelve) hours as needed.  Marland Kitchen omeprazole (PRILOSEC) 40 MG capsule Take 40 mg by mouth daily.  . ondansetron (ZOFRAN) 4 MG tablet Take 1 tablet (4 mg total) by mouth every 8 (eight) hours as needed for nausea or vomiting.  . prochlorperazine (COMPAZINE) 10 MG tablet Take 10 mg by mouth every 6 (six) hours as needed for nausea or vomiting.  . propranolol ER (INDERAL LA) 120 MG 24 hr  capsule Take 120 mg by mouth daily.  . Rimegepant Sulfate (NURTEC) 75 MG TBDP Take 75 mg by mouth as needed.  . sertraline (ZOLOFT) 100 MG tablet Take 100 mg by mouth daily.  . simvastatin (ZOCOR) 40 MG tablet Take 40 mg by mouth daily.  Marland Kitchen tiZANidine (ZANAFLEX) 4 MG tablet Take 4 mg by mouth every 12 (twelve) hours as needed for muscle spasms.   . Vitamin D, Ergocalciferol, (DRISDOL) 1.25 MG (50000 UNIT) CAPS capsule Take 1 capsule (50,000 Units total) by mouth every 7 (seven) days.  Marland Kitchen HYDROcodone-acetaminophen (NORCO/VICODIN) 5-325 MG tablet TK 1 T PO Q 6 H (Patient not taking: Reported on 02/11/2020)  . promethazine (PROMETHEGAN) 50 MG suppository Place 50 mg rectally every 8 (eight) hours as needed for nausea or vomiting.  (Patient not taking: Reported on 02/11/2020)  . Rimegepant Sulfate (NURTEC) 75 MG TBDP Take 1 tablet by mouth daily as needed. (Patient not taking: Reported on 02/11/2020)  . triamcinolone (KENALOG) 0.025 % ointment Apply 1 application topically 2 (two) times daily. (Patient not taking: Reported on 02/11/2020)   No facility-administered encounter medications on file as of 02/11/2020.    Allergies: Allergies  Allergen Reactions  . Penicillins Hives, Itching and Other (See Comments)    Has patient had a PCN reaction causing immediate rash, facial/tongue/throat swelling, SOB or lightheadedness with hypotension: No Has patient had a PCN reaction causing severe rash involving mucus membranes or skin necrosis: No Has patient had a PCN reaction that required hospitalization: No Has patient had  a PCN reaction occurring within the last 10 years: Yes If all of the above answers are "NO", then may proceed with Cephalosporin use.   . Lipitor [Atorvastatin Calcium]   . Zithromax [Azithromycin] Hives and Itching    Family History: Family History  Problem Relation Age of Onset  . Migraines Mother   . Heart disease Father   . Migraines Father   . Migraines Daughter   . Breast  cancer Neg Hx     Social History: Social History   Socioeconomic History  . Marital status: Legally Separated    Spouse name: Not on file  . Number of children: 2  . Years of education: Not on file  . Highest education level: High school graduate  Occupational History  . Not on file  Tobacco Use  . Smoking status: Never Smoker  . Smokeless tobacco: Never Used  Vaping Use  . Vaping Use: Never used  Substance and Sexual Activity  . Alcohol use: No    Alcohol/week: 0.0 standard drinks  . Drug use: No  . Sexual activity: Not Currently    Birth control/protection: Surgical  Other Topics Concern  . Not on file  Social History Narrative   Pt is single she lives with father of children   She has 2 children   She is right hand   Drinks: ginger ale, no tea no soda, no coffee   Social Determinants of Health   Financial Resource Strain:   . Difficulty of Paying Living Expenses: Not on file  Food Insecurity:   . Worried About Charity fundraiser in the Last Year: Not on file  . Ran Out of Food in the Last Year: Not on file  Transportation Needs:   . Lack of Transportation (Medical): Not on file  . Lack of Transportation (Non-Medical): Not on file  Physical Activity:   . Days of Exercise per Week: Not on file  . Minutes of Exercise per Session: Not on file  Stress:   . Feeling of Stress : Not on file  Social Connections:   . Frequency of Communication with Friends and Family: Not on file  . Frequency of Social Gatherings with Friends and Family: Not on file  . Attends Religious Services: Not on file  . Active Member of Clubs or Organizations: Not on file  . Attends Archivist Meetings: Not on file  . Marital Status: Not on file  Intimate Partner Violence:   . Fear of Current or Ex-Partner: Not on file  . Emotionally Abused: Not on file  . Physically Abused: Not on file  . Sexually Abused: Not on file    Observations/Objective:   Height 5\' 11"  (1.803 m),  weight 204 lb (92.5 kg), last menstrual period 01/13/2016. No acute distress.  Alert and oriented.  Speech fluent and not dysarthric.  Language intact.  Assessment and Plan:   Chronic migraine without aura  1.  Will stop Botox.  Will start Qulipta. 2.  Fioricet as needed.  Limit use of pain relievers to no more than 2 days out of week to prevent risk of rebound or medication-overuse headache. 3.  Keep headache diary 4.  Follow up in 6 months.  Follow Up Instructions:    -I discussed the assessment and treatment plan with the patient. The patient was provided an opportunity to ask questions and all were answered. The patient agreed with the plan and demonstrated an understanding of the instructions.   The patient was advised to  call back or seek an in-person evaluation if the symptoms worsen or if the condition fails to improve as anticipated.   Dudley Major, DO

## 2020-02-11 ENCOUNTER — Encounter: Payer: Self-pay | Admitting: Neurology

## 2020-02-11 ENCOUNTER — Other Ambulatory Visit: Payer: Self-pay

## 2020-02-11 ENCOUNTER — Telehealth (INDEPENDENT_AMBULATORY_CARE_PROVIDER_SITE_OTHER): Payer: Medicaid Other | Admitting: Neurology

## 2020-02-11 VITALS — Ht 71.0 in | Wt 204.0 lb

## 2020-02-11 DIAGNOSIS — G43709 Chronic migraine without aura, not intractable, without status migrainosus: Secondary | ICD-10-CM

## 2020-02-19 NOTE — Progress Notes (Addendum)
11/5- called [facility] and set up patient account and gave verbal script for Botox.   New PA Approval valid from 03/04/20 to 03/04/21.

## 2020-02-24 ENCOUNTER — Other Ambulatory Visit: Payer: Self-pay | Admitting: Neurology

## 2020-02-24 ENCOUNTER — Encounter: Payer: Self-pay | Admitting: Neurology

## 2020-02-24 ENCOUNTER — Ambulatory Visit: Payer: Medicaid Other

## 2020-02-24 NOTE — Progress Notes (Signed)
Barry Brunner Key: [id number] help? Call us at [phone] Status Sent to [place] 60MG  tablets Form [facility] Medicaid of [employer] Prior Authorization Request Form [phone] NCPDP)

## 2020-02-24 NOTE — Telephone Encounter (Signed)
Advise pt form for Christine Vega was filled out and faxed over to the company. Phone number given to the pt.   Will re fax form.

## 2020-02-24 NOTE — Progress Notes (Signed)
Christine Vega Key: [id number] - PA Case ID: [id number] Need help? Call us at [phone] Outcome Approvedtoday Approved. This drug has been approved. Approved quantity: 30 <> per 30 day(s). You may fill up to a 34 day supply at a retail pharmacy. You may fill up to a 90 day supply for maintenance drugs, please refer to the formulary for details. Please call the pharmacy to process your prescription claim. Drug Qulipta 60MG  tablets Form [facility] Medicaid of [employer] Prior Authorization Request Form [phone] NCPDP)

## 2020-03-01 ENCOUNTER — Other Ambulatory Visit: Payer: Self-pay

## 2020-03-01 ENCOUNTER — Ambulatory Visit
Admission: RE | Admit: 2020-03-01 | Discharge: 2020-03-01 | Disposition: A | Discharge status: Home / Self Care | Payer: Medicaid Other | Source: Ambulatory Visit | Attending: Family Medicine | Admitting: Family Medicine

## 2020-03-01 DIAGNOSIS — Z01419 Encounter for gynecological examination (general) (routine) without abnormal findings: Secondary | ICD-10-CM

## 2020-03-09 ENCOUNTER — Other Ambulatory Visit: Payer: Self-pay | Admitting: Family Medicine

## 2020-03-09 DIAGNOSIS — E559 Vitamin D deficiency, unspecified: Secondary | ICD-10-CM

## 2020-03-15 ENCOUNTER — Encounter: Payer: Self-pay | Admitting: Neurology

## 2020-03-15 NOTE — Progress Notes (Addendum)
Patient to stop botox per jaffe last ov note!

## 2020-03-17 NOTE — Telephone Encounter (Signed)
Patient states that she has not heard anything about getting the medication from the speciality pharmacy she wants to know the status of this   Medication is  Qulipta   Please call

## 2020-03-17 NOTE — Telephone Encounter (Signed)
Pt advised I am looking into that as well as our rep for the [place].   I will give her call back once find out something. Pt did pick up samples. Will need some more medication soon.

## 2020-03-21 ENCOUNTER — Other Ambulatory Visit: Payer: Self-pay | Admitting: Neurology

## 2020-03-21 ENCOUNTER — Telehealth: Payer: Self-pay

## 2020-03-21 MED ORDER — QULIPTA 60 MG PO TABS: 60.0000 mg | ORAL_TABLET | Freq: Every day | ORAL | 5 refills | Status: DC

## 2020-03-21 NOTE — Progress Notes (Signed)
Qulipta approved.  Sent prescription to [facility] on [address]

## 2020-03-25 ENCOUNTER — Ambulatory Visit: Payer: Medicaid Other | Admitting: Neurology

## 2020-06-05 ENCOUNTER — Other Ambulatory Visit: Payer: Self-pay

## 2020-06-05 ENCOUNTER — Encounter (HOSPITAL_COMMUNITY): Payer: Self-pay | Admitting: Emergency Medicine

## 2020-06-05 ENCOUNTER — Ambulatory Visit (HOSPITAL_COMMUNITY)
Admission: EM | Admit: 2020-06-05 | Discharge: 2020-06-05 | Disposition: A | Discharge status: Home / Self Care | Payer: Medicaid Other | Attending: Emergency Medicine | Admitting: Emergency Medicine

## 2020-06-05 DIAGNOSIS — N368 Other specified disorders of urethra: Secondary | ICD-10-CM

## 2020-06-05 NOTE — ED Provider Notes (Signed)
HPI  SUBJECTIVE:  Christine Vega is a 50 y.o. female who presents with hard mass/swelling at the entrance of her vagina for the past 2 days.  She states that it is "uncomfortable" but not particularly painful.  No purulent or bloody drainage, itching, nausea, vomiting, fevers, abdominal, back, pelvic pain.  No genital rash.  She states it has not changed in size since she noticed it.  No urinary symptoms.  She has never had symptoms like this before.  She is sexually active with a single female partner who is asymptomatic to her knowledge.  They use condoms consistently.  No vaginal odor or discharge.  She is currently being treated for a vaginal yeast infection.  No alleviating factors.  Symptoms are worse with palpation, sitting down.  She has not tried anything for this.  Past medical history seizures, fibroids status post hysterectomy, HPV, HSV, BV, yeast.  No history of Bartholin cyst, gonorrhea, chlamydia, HIV, syphilis, trichomonas, MRSA, diabetes.  ZLD:[id number], Henderson Newcomer, MD. [facility] [place] in 2 days.    Past Medical History:  Diagnosis Date  . Anemia    history   . Anxiety   . Back pain    rotates to left hip and tailbone  . Depression   . DUB (dysfunctional uterine bleeding) 12/01/2010  . Endometriosis   . Fractured coccyx ([place])   . Heart murmur    no problems  . History of seasonal allergies   . Hyperlipidemia   . Migraine   . Migraines    tx w/depakote/verapamil per pt  . Murmur, heart   . 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 Post traumatic stress disorder (PTSD)   . Seizures ([place]) 2004   x 1; unknown source  . Uterine leiomyoma     Past Surgical History:  Procedure Laterality Date  . CHOLECYSTECTOMY    . HERNIA REPAIR    . HYSTERECTOMY ABDOMINAL WITH SALPINGECTOMY Left 02/07/2016   Procedure: HYSTERECTOMY ABDOMINAL WITH  left SALPINGECTOMY;  Surgeon: Mora Bellman, MD;  Location:  [facility];  Service: Gynecology;  Laterality: Left;  . LAPAROSCOPY  05/16/2011   Procedure: LAPAROSCOPY OPERATIVE;  Surgeon: Emeterio Reeve, MD;  Location: [facility];  Service: Gynecology;  Laterality: N/A;  poss oophorectomy  . LAPAROSCOPY  08/06/2011   Procedure: LAPAROSCOPY OPERATIVE;  Surgeon: Woodroe Mode, MD;  Location: [facility];  Service: Gynecology;  Laterality: N/A;  . LAPAROTOMY     for endometriosis/adhesions  . SALPINGOOPHORECTOMY  08/06/2011   Procedure: SALPINGO OOPHERECTOMY;  Surgeon: Woodroe Mode, MD;  Location: [facility];  Service: Gynecology;  Laterality: Right;  . SVD     x 2  . TUBAL LIGATION    . WISDOM TOOTH EXTRACTION      Family History  Problem Relation Age of Onset  . Migraines Mother   . Heart disease Father   . Migraines Father   . Migraines Daughter   . Breast cancer Neg Hx     Social History   Tobacco Use  . Smoking status: Never Smoker  . Smokeless tobacco: Never Used  Vaping Use  . Vaping Use: Never used  Substance Use Topics  . Alcohol use: No    Alcohol/week: 0.0 standard drinks  . Drug use: No    No current facility-administered medications for this encounter.  Current Outpatient Medications:  .  AMITIZA 24 MCG capsule, Take 24 mcg by mouth 2 (  two) times daily., Disp: , Rfl:  .  Atogepant (QULIPTA) 60 MG TABS, Take 60 mg by mouth daily., Disp: 30 tablet, Rfl: 5 .  Boric Acid CRYS, Place 600 mg vaginally 2 (two) times a week., Disp: 500 g, Rfl: 2 .  BOTOX 200 units SOLR, INJECT 200 UNITS AS DIRECTED ONCE FOR 1 DOSE., Disp: , Rfl:  .  buPROPion (WELLBUTRIN XL) 150 MG 24 hr tablet, Take 150 mg by mouth daily., Disp: , Rfl:  .  butalbital-acetaminophen-caffeine (FIORICET, ESGIC) 50-325-40 MG tablet, Take 1 tablet by mouth every 8 (eight) hours as needed for migraine., Disp: , Rfl:  .  cyclobenzaprine (FLEXERIL) 5 MG tablet, Take 1 tablet (5 mg total) by mouth 3 (three) times daily as needed for muscle spasms., Disp: 10 tablet, Rfl: 0 .  eletriptan  (RELPAX) 40 MG tablet, TAKE 1 TABLET BY MOUTH AS NEEDED FOR MIGRAINE OR HEADACHE, MAY REPEAT IN 2 HOURS IF HEADACHE PERSISTS OR RECURS, Disp: 10 tablet, Rfl: 0 .  fluconazole (DIFLUCAN) 150 MG tablet, Take 1 tablet (150 mg total) by mouth daily. Repeat in 24 hours if needed, Disp: 2 tablet, Rfl: 2 .  fluticasone (FLONASE) 50 MCG/ACT nasal spray, SHAKE LIQUID AND USE 2 SPRAYS IN EACH NOSTRIL DAILY, Disp: , Rfl:  .  gabapentin (NEURONTIN) 300 MG capsule, Take 300 mg by mouth 3 (three) times daily., Disp: , Rfl:  .  HYDROcodone-acetaminophen (NORCO/VICODIN) 5-325 MG tablet, TK 1 T PO Q 6 H (Patient not taking: Reported on 02/11/2020), Disp: , Rfl: 0 .  hydrOXYzine (ATARAX/VISTARIL) 50 MG tablet, Take 50 mg by mouth 3 (three) times daily as needed., Disp: , Rfl:  .  lamoTRIgine (LAMICTAL) 100 MG tablet, Take 100 mg by mouth daily., Disp: , Rfl:  .  meloxicam (MOBIC) 15 MG tablet, Take 1 tablet (15 mg total) by mouth daily., Disp: 30 tablet, Rfl: 1 .  naproxen (NAPROSYN) 500 MG tablet, Take 1 tablet (500 mg total) by mouth every 12 (twelve) hours as needed., Disp: 16 tablet, Rfl: 2 .  omeprazole (PRILOSEC) 40 MG capsule, Take 40 mg by mouth daily., Disp: , Rfl:  .  ondansetron (ZOFRAN) 4 MG tablet, Take 1 tablet (4 mg total) by mouth every 8 (eight) hours as needed for nausea or vomiting., Disp: 20 tablet, Rfl: 3 .  prochlorperazine (COMPAZINE) 10 MG tablet, Take 10 mg by mouth every 6 (six) hours as needed for nausea or vomiting., Disp: , Rfl:  .  promethazine (PROMETHEGAN) 50 MG suppository, Place 50 mg rectally every 8 (eight) hours as needed for nausea or vomiting.  (Patient not taking: Reported on 02/11/2020), Disp: , Rfl:  .  propranolol ER (INDERAL LA) 120 MG 24 hr capsule, Take 120 mg by mouth daily., Disp: , Rfl:  .  Rimegepant Sulfate (NURTEC) 75 MG TBDP, Take 1 tablet by mouth daily as needed. (Patient not taking: Reported on 02/11/2020), Disp: 3 tablet, Rfl: 0 .  Rimegepant Sulfate (NURTEC) 75  MG TBDP, Take 75 mg by mouth as needed., Disp: 4 tablet, Rfl: 0 .  sertraline (ZOLOFT) 100 MG tablet, Take 100 mg by mouth daily., Disp: , Rfl:  .  simvastatin (ZOCOR) 40 MG tablet, Take 40 mg by mouth daily., Disp: , Rfl:  .  tiZANidine (ZANAFLEX) 4 MG tablet, Take 4 mg by mouth every 12 (twelve) hours as needed for muscle spasms. , Disp: , Rfl:  .  triamcinolone (KENALOG) 0.025 % ointment, Apply 1 application topically 2 (two) times daily. (Patient  not taking: Reported on 02/11/2020), Disp: 80 g, Rfl: 2  Allergies  Allergen Reactions  . Penicillins Hives, Itching and Other (See Comments)    Has patient had a PCN reaction causing immediate rash, facial/tongue/throat swelling, SOB or lightheadedness with hypotension: No Has patient had a PCN reaction causing severe rash involving mucus membranes or skin necrosis: No Has patient had a PCN reaction that required hospitalization: No Has patient had a PCN reaction occurring within the last 10 years: Yes If all of the above answers are "NO", then may proceed with Cephalosporin use.   . Lipitor [Atorvastatin Calcium]   . Zithromax [Azithromycin] Hives and Itching     ROS  As noted in HPI.   Physical Exam  BP 120/79 (BP Location: Left Arm)   Pulse 95   Resp 20   LMP 01/13/2016 (Exact Date)   SpO2 97%   Constitutional: Well developed, well nourished, no acute distress Eyes:  EOMI, conjunctiva normal bilaterally HENT: Normocephalic, atraumatic,mucus membranes moist Respiratory: Normal inspiratory effort Cardiovascular: Normal rate GI: nondistended soft, nontender. No suprapubic tenderness  back: No CVA tenderness GU: External genitalia normal.  No inner outer labial swelling, tenderness.  No ulcers, rash.  Positive nontender firm mass in the 12 o'clock position at theentrance to the vagina..  When mass is gently compressed, some urine comes out of the urethra.  Questionable small cystocele with Valsalva.  External urethra appears  normal.  Chaperone present during exam Lymph: No inguinal lymphadenopathy skin: No rash, skin intact Musculoskeletal: no deformities Neurologic: Alert & oriented x 3, no focal neuro deficits Psychiatric: Speech and behavior appropriate   ED Course   Medications - No data to display  No orders of the defined types were placed in this encounter.   No results found for this or any previous visit (from the past 24 hour(s)). No results found.  ED Clinical Impression  1. Urethral prolapse     Suspect urethral prolapse.  She has no urinary complaints.  No evidence of inner or outer labial infection, Bartholin cyst, urethral trauma. she has follow-up with OB/GYN on Tuesday at 1500, in 2 days.  Will have her evaluated by them for next steps.  Discussed findings, medical decision-making, need for follow-up with OB/GYN.  Patient agrees with plan  ED Assessment/Plan No orders of the defined types were placed in this encounter.   *This clinic note was created using Dragon dictation software. Therefore, there may be occasional mistakes despite careful proofreading.  ?     Melynda Ripple, MD 06/06/20 1723

## 2020-06-05 NOTE — ED Triage Notes (Signed)
Complains of knot in vagina area for 3-4 days.  Area is large, irritated and uncomfortable

## 2020-06-05 NOTE — Discharge Instructions (Addendum)
I suspect that you have a urethral prolapse or small cystocele.  Talk to OB/GYN about what next steps need to be made.

## 2020-06-07 ENCOUNTER — Encounter: Payer: Self-pay | Admitting: Obstetrics and Gynecology

## 2020-06-07 ENCOUNTER — Other Ambulatory Visit (HOSPITAL_COMMUNITY)
Admission: RE | Admit: 2020-06-07 | Discharge: 2020-06-07 | Disposition: A | Discharge status: Home / Self Care | Payer: Medicaid Other | Source: Ambulatory Visit | Attending: Obstetrics and Gynecology | Admitting: Obstetrics and Gynecology

## 2020-06-07 ENCOUNTER — Other Ambulatory Visit: Payer: Self-pay

## 2020-06-07 ENCOUNTER — Ambulatory Visit (INDEPENDENT_AMBULATORY_CARE_PROVIDER_SITE_OTHER): Payer: Medicaid Other | Admitting: Obstetrics and Gynecology

## 2020-06-07 VITALS — BP 130/81 | HR 91 | Wt 213.0 lb

## 2020-06-07 DIAGNOSIS — R21 Rash and other nonspecific skin eruption: Secondary | ICD-10-CM | POA: Diagnosis not present

## 2020-06-07 DIAGNOSIS — R102 Pelvic and perineal pain: Secondary | ICD-10-CM | POA: Diagnosis not present

## 2020-06-07 DIAGNOSIS — Z113 Encounter for screening for infections with a predominantly sexual mode of transmission: Secondary | ICD-10-CM | POA: Insufficient documentation

## 2020-06-07 MED ORDER — TRIAMCINOLONE ACETONIDE 0.1 % EX OINT: 1.0000 "application " | TOPICAL_OINTMENT | Freq: Every morning | CUTANEOUS | 0 refills | Status: DC

## 2020-06-07 MED ORDER — NYSTATIN 100000 UNIT/GM EX OINT: 1.0000 "application " | TOPICAL_OINTMENT | Freq: Every day | CUTANEOUS | 0 refills | Status: AC

## 2020-06-07 NOTE — Progress Notes (Signed)
Obstetrics and Gynecology Visit Return Patient Evaluation  Appointment Date: 06/07/2020  Primary Care Provider: Gar[place], [place] for [facility]  Chief Complaint: f/u urgent care visit for vaginal pain. STD check  History of Present Illness:  Christine Vega is a 50 y.o. with above CC. She has had a 2017 TAH/LS for fibroids and a prior RSO for dermoid.  Patient went to UC on 2/20 for vaginal pain feeling a mass in that area. UC exam: GU: External genitalia normal.  No inner outer labial swelling, tenderness.  No ulcers, rash.  Positive nontender firm mass in the 12 o'clock position at theentrance to the vagina..  When mass is gently compressed, some urine comes out of the urethra.  Questionable small cystocele with Valsalva.  External urethra appears normal.    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  She does endorse hot flashes that has been ongoing since the hyst.  She denies any issues with voiding or urination.   Review of Systems: as noted in the History of Present Illness.  Patient Active Problem List   Diagnosis Date Noted  . Vaginal irritation 03/05/2018  . Nasal turbinate hypertrophy 05/14/2017  . Iron deficiency anemia due to chronic blood loss 04/05/2016  . 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 Migraine without aura 06/24/2013  . Vaginal pain 12/01/2010   Medications:  Christine Vega. Patron had no medications administered during this visit. Current Outpatient Medications  Medication Sig Dispense Refill  . AMITIZA 24 MCG capsule Take 24 mcg by mouth 2 (two) times daily.    . Atogepant (QULIPTA) 60 MG TABS Take 60 mg by mouth daily. 30 tablet 5  . BOTOX 200 units SOLR INJECT 200 UNITS AS DIRECTED ONCE FOR 1 DOSE.    Marland Kitchen buPROPion (WELLBUTRIN XL)  150 MG 24 hr tablet Take 150 mg by mouth daily.    . butalbital-acetaminophen-caffeine (FIORICET, ESGIC) 50-325-40 MG tablet Take 1 tablet by mouth every 8 (eight) hours as needed for migraine.    . cyclobenzaprine (FLEXERIL) 5 MG tablet Take 1 tablet (5 mg total) by mouth 3 (three) times daily as needed for muscle spasms. 10 tablet 0  . eletriptan (RELPAX) 40 MG tablet TAKE 1 TABLET BY MOUTH AS NEEDED FOR MIGRAINE OR HEADACHE, MAY REPEAT IN 2 HOURS IF HEADACHE PERSISTS OR RECURS 10 tablet 0  . fluconazole (DIFLUCAN) 150 MG tablet Take 1 tablet (150 mg total) by mouth daily. Repeat in 24 hours if needed 2 tablet 2  . fluticasone (FLONASE) 50 MCG/ACT nasal spray SHAKE LIQUID AND USE 2 SPRAYS IN EACH NOSTRIL DAILY    . gabapentin (NEURONTIN) 300 MG capsule Take 300 mg by mouth 3 (three) times daily.    Marland Kitchen HYDROcodone-acetaminophen (NORCO/VICODIN) 5-325 MG tablet TK 1 T PO Q 6 H (Patient not taking: Reported on 02/11/2020)  0  . hydrOXYzine (ATARAX/VISTARIL) 50 MG tablet Take 50 mg by mouth 3 (three) times daily as needed.    . lamoTRIgine (LAMICTAL) 100 MG tablet Take 100 mg by mouth daily.    . meloxicam (MOBIC) 15 MG tablet Take 1 tablet (15 mg total) by mouth daily. 30 tablet 1  . naproxen (NAPROSYN) 500 MG tablet Take 1 tablet (500 mg total) by mouth every 12 (twelve) hours as needed. 16 tablet 2  .  omeprazole (PRILOSEC) 40 MG capsule Take 40 mg by mouth daily.    . ondansetron (ZOFRAN) 4 MG tablet Take 1 tablet (4 mg total) by mouth every 8 (eight) hours as needed for nausea or vomiting. 20 tablet 3  . prochlorperazine (COMPAZINE) 10 MG tablet Take 10 mg by mouth every 6 (six) hours as needed for nausea or vomiting.    . promethazine (PROMETHEGAN) 50 MG suppository Place 50 mg rectally every 8 (eight) hours as needed for nausea or vomiting.  (Patient not taking: Reported on 02/11/2020)    . propranolol ER (INDERAL LA) 120 MG 24 hr capsule Take 120 mg by mouth daily.    . Rimegepant Sulfate  (NURTEC) 75 MG TBDP Take 1 tablet by mouth daily as needed. (Patient not taking: Reported on 02/11/2020) 3 tablet 0  . Rimegepant Sulfate (NURTEC) 75 MG TBDP Take 75 mg by mouth as needed. 4 tablet 0  . sertraline (ZOLOFT) 100 MG tablet Take 100 mg by mouth daily.    . simvastatin (ZOCOR) 40 MG tablet Take 40 mg by mouth daily.    Marland Kitchen tiZANidine (ZANAFLEX) 4 MG tablet Take 4 mg by mouth every 12 (twelve) hours as needed for muscle spasms.      No current facility-administered medications for this visit.    Allergies: is allergic to penicillins, lipitor [atorvastatin calcium], and zithromax [azithromycin].  Physical Exam:  BP 130/81   Pulse 91   Wt 213 lb (96.6 kg)   LMP 01/13/2016 (Exact Date)   BMI 29.71 kg/m  Body mass index is 29.71 kg/m. General appearance: Well nourished, well developed female in no acute distress.  Abdomen: diffusely non tender to palpation, non distended, and no masses, hernias Neuro/Psych:  Normal mood and affect.    Pelvic exam:  EGBUS normal Vagina: on speculum exam, normal appearing vagina and cuff. Only bottom half speculum used posteriorly and had patient valsalva with mild anterior vault prolapse to -2 for Ba. On manual exam, she notes tenderness at the uretheral neck. There is a slight hardness there but I only note this b/c of the tenderness she has and wouldn't think it abnormal if she was asymptomatic   Assessment: pt stable  Plan:  1. Screen for STD (sexually transmitted disease) - Cervicovaginal ancillary only - Hepatitis B surface antigen - Hepatitis C antibody - HIV Antibody (routine testing w rflx) - RPR  2. Vaginal pain She states that she had a "bladder tack" during the time of her hysterectomy but it's not described in the note so I told her I'm not sure what was done but it just looks like a usual TAH.  I told her I'm not sure what the cause of her s/s are but recommend f/u her swabs and trying mycolog 2 ointment.   I also told her  that we can about HRT at her next visit based on her response to this.   RTC: 1 month for f/u exam.   Durene Romans MD Attending Center for [facility])

## 2020-06-08 LAB — RPR: RPR Ser Ql: NONREACTIVE

## 2020-06-08 LAB — HEPATITIS C ANTIBODY: Hep C Virus Ab: <0.1 s/co ratio (ref 0.0–0.9)

## 2020-06-08 LAB — HIV ANTIBODY (ROUTINE TESTING W REFLEX): HIV Screen 4th Generation wRfx: NONREACTIVE

## 2020-06-08 LAB — HEPATITIS B SURFACE ANTIGEN: Hepatitis B Surface Ag: NEGATIVE

## 2020-06-09 LAB — CERVICOVAGINAL ANCILLARY ONLY
Bacterial Vaginitis (gardnerella): NEGATIVE
Candida Glabrata: POSITIVE — AB
Candida Vaginitis: NEGATIVE
Chlamydia: NEGATIVE
Comment: NEGATIVE
Comment: NEGATIVE
Comment: NEGATIVE
Comment: NEGATIVE
Comment: NEGATIVE
Comment: NORMAL
Neisseria Gonorrhea: NEGATIVE
Trichomonas: NEGATIVE

## 2020-06-10 ENCOUNTER — Other Ambulatory Visit: Payer: Self-pay | Admitting: Obstetrics and Gynecology

## 2020-06-27 ENCOUNTER — Encounter: Payer: Self-pay | Admitting: Obstetrics and Gynecology

## 2020-06-27 ENCOUNTER — Ambulatory Visit: Payer: Medicaid Other | Admitting: Obstetrics and Gynecology

## 2020-06-28 NOTE — Progress Notes (Signed)
Patient did not keep her GYN follow up appointment for 06/27/2020.  Durene Romans MD Attending Center for [employer] [facility])

## 2020-08-10 NOTE — Progress Notes (Signed)
Virtual Visit via Video Note The purpose of this virtual visit is to provide medical care while limiting exposure to the novel coronavirus.    Consent was obtained for video visit:  Yes.   Answered questions that patient had about telehealth interaction:  Yes.   I discussed the limitations, risks, security and privacy concerns of performing an evaluation and management service by telemedicine. I also discussed with the patient that there may be a patient responsible charge related to this service. The patient expressed understanding and agreed to proceed.  Pt location: Home Physician Location: office Name of referring provider:  [name], MD I connected with Si Gaul at patients initiation/request on 08/12/2020 at 10:50 AM EDT by video enabled telemedicine application and verified that I am speaking with the correct person using two identifiers. Pt MRN:  [id number] Pt DOB:  20-Jul-1970 Video Participants:  Si Gaul  Assessment and Plan:   Chronic migraine - Qulipta ineffective  1.  Migraine prevention:  Stop Qulipta.  Start [name] Q28d 2.  Migraine rescue:  She will try Reyvow 3.  Limit use of pain relievers to no more than 2 days out of week to prevent risk of rebound or medication-overuse headache. 4. Keep headache diary 5. Follow up 6 months. She plans to pick up sample of [name] and Reyvow on Monday.  History of Present Illness:  Christine Vega is a50year old woman with migraines and chronic pain who follows up for migraine.  UPDATE: Switched from Botox to [place] in October. Intensity: severe Duration:2 1/2 days Frequency: 13 to 14 days a month Frequency of abortive medication:Fioricet 13 to 14 days a month. Current NSAIDS: Mobic Current analgesics: Fioricet Current triptans/ergot: None Current anti-emetic: Zofran 4mg ;Compazine 10mg  Current muscle relaxants: tizanidine 4mg  Current anti-anxiolytic: no Current sleep aide:  no Current Antihypertensive medications: Propranolol ER 120mg  Current Antidepressant medications: bupropion, sertraline 100mg  Current Anticonvulsant medications:gabapentin 300mg  three times daily, lamotrigine 100mg  daily Current CGRP inhibitor:Qulipta 60mg  QD Current Vitamins/Herbal/Supplements: D, E Current Antihistamines/Decongestants: hydroxyzine Other therapy: none  Caffeine: Rarely coffee, tea, soda Alcohol: no Smoker: no Diet: Hydrates Exercise: yes Depression/anxiety: stable Sleep hygiene: poor  HISTORY: Onset: 50 years old Location: Either temple Quality: Throbbing, pressure Initial Intensity: 10/10 Aura: no Prodrome: no Associated symptoms: Nausea, vomiting, photophobia, phonophobia, osmophobia, blurred vision Initial Duration: 3 days  Initial Frequency: 16 headache days a month Triggers: Certain smells, chocolate , garlic Relieving factors: None Activity: Cannot function  Past NSAIDS: Ibuprofen, naproxen Past analgesics: Tylenol, oxycodone, Excedrin, Fioricet Past abortive triptans/ergot: Sumatriptan (tablet and injection), Maxalt, Zomig NS, Migranal NS, Relpax Past muscle relaxants: Flexeril Past anti-emetic: promethazine 50mg  PR Past anti-anxiolytic: lorazepam Past antihypertensive medications: verapamil Past antidepressant medications: nortriptyline Past anticonvulsant medications: Topiramate, Lyrica Past CGRP inhibitor:  Aimovig 70mg  (makes her feel weird);Roselyn Meier (makes her feel queasy, ineffective), Nurtec Past vitamins/Herbal/Supplements: No Other therapy: Botox  Family history of headache: Mother, daughters  CT of head and cervical spine from 12/20/15 were personally reviewed and were negative for acute changes. Cervical spine did demonstrate degenerative disc disease at C5-6 and C6-7.   Past Medical History: Past Medical History:  Diagnosis Date  . Anemia    history   . Anxiety   . Back pain    rotates  to left hip and tailbone  . Depression   . DUB (dysfunctional uterine bleeding) 12/01/2010  . Endometriosis   . Fractured coccyx ([place])   . Heart murmur    no problems  . History of seasonal  allergies   . Hyperlipidemia   . Migraine   . Migraines    tx w/depakote/verapamil per pt  . Murmur, heart   . 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 Post traumatic stress disorder (PTSD)   . Seizures ([place]) 2004   x 1; unknown source  . Uterine leiomyoma     Medications: Outpatient Encounter Medications as of 08/12/2020  Medication Sig  . AMITIZA 24 MCG capsule Take 24 mcg by mouth 2 (two) times daily.  . Atogepant (QULIPTA) 60 MG TABS Take 60 mg by mouth daily.  Marland Kitchen BOTOX 200 units SOLR INJECT 200 UNITS AS DIRECTED ONCE FOR 1 DOSE.  Marland Kitchen buPROPion (WELLBUTRIN XL) 150 MG 24 hr tablet Take 150 mg by mouth daily.  . butalbital-acetaminophen-caffeine (FIORICET, ESGIC) 50-325-40 MG tablet Take 1 tablet by mouth every 8 (eight) hours as needed for migraine.  . cyclobenzaprine (FLEXERIL) 5 MG tablet Take 1 tablet (5 mg total) by mouth 3 (three) times daily as needed for muscle spasms.  Marland Kitchen eletriptan (RELPAX) 40 MG tablet TAKE 1 TABLET BY MOUTH AS NEEDED FOR MIGRAINE OR HEADACHE, MAY REPEAT IN 2 HOURS IF HEADACHE PERSISTS OR RECURS  . fluticasone (FLONASE) 50 MCG/ACT nasal spray SHAKE LIQUID AND USE 2 SPRAYS IN EACH NOSTRIL DAILY  . gabapentin (NEURONTIN) 300 MG capsule Take 300 mg by mouth 3 (three) times daily.  . hydrOXYzine (ATARAX/VISTARIL) 50 MG tablet Take 50 mg by mouth 3 (three) times daily as needed.  . lamoTRIgine (LAMICTAL) 100 MG tablet Take 100 mg by mouth daily.  . meloxicam (MOBIC) 15 MG tablet Take 1 tablet (15 mg total) by mouth daily.  . naproxen (NAPROSYN) 500 MG tablet Take 1 tablet (500 mg total) by mouth every 12 (twelve) hours as needed.  Marland Kitchen omeprazole (PRILOSEC) 40 MG capsule Take 40 mg by mouth daily.  . ondansetron (ZOFRAN) 4 MG tablet Take 1  tablet (4 mg total) by mouth every 8 (eight) hours as needed for nausea or vomiting.  . prochlorperazine (COMPAZINE) 10 MG tablet Take 10 mg by mouth every 6 (six) hours as needed for nausea or vomiting.  . promethazine (PROMETHEGAN) 50 MG suppository Place 50 mg rectally every 8 (eight) hours as needed for nausea or vomiting.  (Patient not taking: Reported on 02/11/2020)  . propranolol ER (INDERAL LA) 120 MG 24 hr capsule Take 120 mg by mouth daily.  . Rimegepant Sulfate (NURTEC) 75 MG TBDP Take 1 tablet by mouth daily as needed. (Patient not taking: Reported on 02/11/2020)  . Rimegepant Sulfate (NURTEC) 75 MG TBDP Take 75 mg by mouth as needed.  . sertraline (ZOLOFT) 100 MG tablet Take 100 mg by mouth daily.  . simvastatin (ZOCOR) 40 MG tablet Take 40 mg by mouth daily.  Marland Kitchen tiZANidine (ZANAFLEX) 4 MG tablet Take 4 mg by mouth every 12 (twelve) hours as needed for muscle spasms.    No facility-administered encounter medications on file as of 08/12/2020.    Allergies: Allergies  Allergen Reactions  . Penicillins Hives, Itching and Other (See Comments)    Has patient had a PCN reaction causing immediate rash, facial/tongue/throat swelling, SOB or lightheadedness with hypotension: No Has patient had a PCN reaction causing severe rash involving mucus membranes or skin necrosis: No Has patient had a PCN reaction that required hospitalization: No Has patient had a PCN reaction occurring within the last 10 years: Yes If all of the above answers are "NO", then may proceed  with Cephalosporin use.   . Lipitor [Atorvastatin Calcium]   . Zithromax [Azithromycin] Hives and Itching    Family History: Family History  Problem Relation Age of Onset  . Migraines Mother   . Heart disease Father   . Migraines Father   . Migraines Daughter   . Breast cancer Neg Hx     Observations/Objective:   No acute distress.  Alert and oriented.  Speech fluent and not dysarthric.  Language intact.     Follow  Up Instructions:    -I discussed the assessment and treatment plan with the patient. The patient was provided an opportunity to ask questions and all were answered. The patient agreed with the plan and demonstrated an understanding of the instructions.   The patient was advised to call back or seek an in-person evaluation if the symptoms worsen or if the condition fails to improve as anticipated.   Dudley Major, DO

## 2020-08-12 ENCOUNTER — Encounter: Payer: Self-pay | Admitting: Neurology

## 2020-08-12 ENCOUNTER — Telehealth (INDEPENDENT_AMBULATORY_CARE_PROVIDER_SITE_OTHER): Payer: Medicaid Other | Admitting: Neurology

## 2020-08-12 DIAGNOSIS — G43709 Chronic migraine without aura, not intractable, without status migrainosus: Secondary | ICD-10-CM | POA: Diagnosis not present

## 2020-09-22 NOTE — Telephone Encounter (Signed)
ERROR

## 2020-09-23 ENCOUNTER — Telehealth: Payer: Self-pay | Admitting: Neurology

## 2020-09-23 NOTE — Telephone Encounter (Signed)
Left message for patient to contact pharmacy

## 2020-09-23 NOTE — Telephone Encounter (Signed)
Wants a nurse to call her back regarding her injection- PA for it. Needs to know what to do

## 2020-09-27 ENCOUNTER — Telehealth: Payer: Self-pay | Admitting: Neurology

## 2020-09-27 NOTE — Telephone Encounter (Signed)
Patient called and requested a call back from a nurse. She needs to know which pharmacy to call.

## 2020-09-27 NOTE — Telephone Encounter (Signed)
Needs prior authorization for [name]

## 2020-09-28 NOTE — Telephone Encounter (Signed)
Opened in error

## 2020-09-30 NOTE — Telephone Encounter (Signed)
Patient called again to check on the status of the PA for [name]. She said she needs to confirm the pharmacy location as well.  [facility] on [address]

## 2020-09-30 NOTE — Telephone Encounter (Signed)
Left message pending.

## 2020-09-30 NOTE — Telephone Encounter (Signed)
09/30/2020 [name] - Denied by Jack[place]uline [place]

## 2020-10-05 NOTE — Telephone Encounter (Signed)
Pt called again and would like a call back. She said she is hurting really bad today and would like to know whats going on with the medicine. [phone]

## 2020-10-06 ENCOUNTER — Other Ambulatory Visit: Payer: Self-pay

## 2020-10-06 ENCOUNTER — Telehealth: Payer: Self-pay | Admitting: Neurology

## 2020-10-06 MED ORDER — PREDNISONE 20 MG PO TABS: ORAL_TABLET | ORAL | 0 refills | Status: DC

## 2020-10-06 NOTE — Telephone Encounter (Signed)
Spoke with pt she does nto have a way to get here today her daughter dose not drive and her neighbor will not be off work until [employer]. She stated she can not wait until Monday is there anything else we can please try? It informed that I would ask if we could call in any other options for her to the pharmacy and that I would call her back to let her know,

## 2020-10-06 NOTE — Telephone Encounter (Signed)
Pt advised of the Denial of [name]. Pt wanted to know what can she get for right now? The headache had her down since yesterday.   Please advise.

## 2020-10-06 NOTE — Telephone Encounter (Signed)
Does she had diabetes or problems with steroids in the past? We can do a 1-week course of steroids to help break headache cycle: Prednisone 20mg : Take 3 tabs on day 1, 2-1/2 tabs on day 2, 2 tabs on day 3, 1-1/2 tabs on day 4, 1 tab on day 5, 1/2 tab on days 6 and 7, then stop. #11 tabs no refills

## 2020-10-06 NOTE — Telephone Encounter (Signed)
Pt called and informed that we are going to call in prednisone dose pack for her headache to drug store on file,

## 2020-10-06 NOTE — Telephone Encounter (Signed)
Can offer migraine cocktail if she can have a driver bring her today, thanks

## 2020-10-06 NOTE — Telephone Encounter (Signed)
Christine Vega Key: [id number] - PA Case ID: [id number] Need help? Call us at [phone] Outcome Deniedon June 14 Denied. The drug that you asked for is not covered. We are denying your request because we do not show that you have tried at least 2 preferred drugs. <>. Other covered drug(s) is/are: <>. Note: Some covered drug(s) may have limits on the quantity you can get. You can get this information on the list of covered drugs (Preferred Drug List) for details.

## 2020-10-13 ENCOUNTER — Telehealth: Payer: Self-pay | Admitting: Neurology

## 2020-10-13 ENCOUNTER — Other Ambulatory Visit: Payer: Self-pay | Admitting: Neurology

## 2020-10-13 MED ORDER — EMGALITY 120 MG/ML ~~LOC~~ SOAJ: 240.0000 mg | Freq: Once | SUBCUTANEOUS | 0 refills | Status: AC

## 2020-10-13 MED ORDER — EMGALITY 120 MG/ML ~~LOC~~ SOAJ: 120.0000 mg | SUBCUTANEOUS | 5 refills | Status: DC

## 2020-10-13 NOTE — Telephone Encounter (Signed)
Patient called to see what other medications Dr. Tomi Likens can recommend for her since her Arie Sabina was denied by her insurance.  [place]

## 2020-10-13 NOTE — Telephone Encounter (Signed)
Voicemail left.

## 2020-10-13 NOTE — Telephone Encounter (Signed)
Please advise 

## 2020-10-21 ENCOUNTER — Encounter: Payer: Self-pay | Admitting: Oncology

## 2020-11-02 ENCOUNTER — Telehealth: Payer: Self-pay | Admitting: Neurology

## 2020-11-02 NOTE — Telephone Encounter (Signed)
Pt called in stating we have to have Prior Authorization for the [facility]. She stated the pharmacy has been trying to get in contact with [place] but we have not responded.

## 2020-11-03 NOTE — Progress Notes (Signed)
Barry Brunner KeyBertha Stakes - PA Case ID: [id number] Need help? Call us at [phone] Status Sent to [employer] Emgality 120MG /ML auto-injectors (migraine) Form [facility] Medicaid of [employer] Prior Authorization Request Form [phone] NCPDP)

## 2020-11-04 NOTE — Telephone Encounter (Signed)
Patient notified of approval and documented

## 2020-11-04 NOTE — Telephone Encounter (Signed)
This was sent to plan yesteray 07/21/222

## 2020-11-04 NOTE — Progress Notes (Signed)
Approval for Emgality 1 ml per 30 days. Approval approval date 11/02/2020 thru end date 02/03/2021, sent paper to scan, pt notified.

## 2020-11-07 ENCOUNTER — Ambulatory Visit: Payer: Medicaid Other | Admitting: Podiatry

## 2020-11-14 ENCOUNTER — Ambulatory Visit: Payer: Medicaid Other | Admitting: Podiatry

## 2020-11-15 ENCOUNTER — Encounter: Payer: Self-pay | Admitting: Radiology

## 2020-11-17 ENCOUNTER — Ambulatory Visit (INDEPENDENT_AMBULATORY_CARE_PROVIDER_SITE_OTHER): Payer: Medicaid Other

## 2020-11-17 ENCOUNTER — Encounter: Payer: Self-pay | Admitting: Podiatry

## 2020-11-17 ENCOUNTER — Ambulatory Visit (INDEPENDENT_AMBULATORY_CARE_PROVIDER_SITE_OTHER): Payer: Medicaid Other | Admitting: Podiatry

## 2020-11-17 ENCOUNTER — Other Ambulatory Visit: Payer: Self-pay

## 2020-11-17 DIAGNOSIS — M21619 Bunion of unspecified foot: Secondary | ICD-10-CM

## 2020-11-17 DIAGNOSIS — M216X2 Other acquired deformities of left foot: Secondary | ICD-10-CM | POA: Diagnosis not present

## 2020-11-18 NOTE — Progress Notes (Signed)
Subjective:   Patient ID: Christine Vega, female   DOB: 50 y.o.   MRN: [id number]   HPI Patient presents with painful bunion deformity of the left first metatarsal.  Patient states this is been getting worse over the last 6 months and making it increasingly hard for her to wear shoe gear with any degree of comfort and states that she does have family history of this.  States that she has tried wider shoes she tried soaks without relief of symptoms and patient does not smoke likes to be active   Review of Systems  All other systems reviewed and are negative.      Objective:  Physical Exam Vitals and nursing note reviewed.  Constitutional:      Appearance: She is well-developed.  Pulmonary:     Effort: Pulmonary effort is normal.  Musculoskeletal:        General: Normal range of motion.  Skin:    General: Skin is warm.  Neurological:     Mental Status: She is alert.    Neurovascular status intact muscle strength was found to be adequate range of motion within normal limits.  Patient is noted to have moderate flatfoot deformity has structural hyperostosis medial aspect first metatarsal head left foot that is red when pressed and making shoe gear difficult and has mild deviation of the hallux with good range of motion of the first MPJ.  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  Due to the longstanding nature and pain and failure to respond conservatively she is opted for surgical intervention and she wants to review consent form today as it is hard for her to get her and she needs to get this done as soon as possible.  I allowed her to read consent form carefully after reviewing alternative treatments complications and after extensive review patient  signed consent form understanding there is no long-term guarantees and the fact that recovery can take 6 months and all complications are possible.  Patient is completely aware of this and is comfortable with this wants surgery signed consent form and is given all preoperative instructions and is encouraged to call with questions that may occur prior to the procedure  X-rays indicate significant elevation of the 1 2 intermetatarsal angle left of approximately 16 degrees with rotation noted and structural bunion formation present left

## 2020-11-24 ENCOUNTER — Telehealth: Payer: Self-pay | Admitting: Urology

## 2020-11-24 NOTE — Telephone Encounter (Addendum)
DOS - 03/07/21  [place] LEFT --- [phone]  [employer] MEDICAID EFFECTIVE DATE - 11/15/19   RECEIVED FAX FROM [employer] STATING CPT CODE 53664 HAS BEEN APPROVED, AUTH # [id number], GOOD FROM 8/23/2 - 12/06/20.    RECEIVED FAX FROM [employer] (CHANGED SX DATE) STATING THAT CPT CODE 40347 HAS BEEN APPROVED, AUTH # [id number], GOOD FROM 01/03/21 - 03/04/21.   SPOKE WITH PAM WITH [employer] AND SHE UPDATED THE NEW SX DATE AND EXTENDED THE DATE RANGE. CPT CODE 42595 HAS BEEN APPROVED, AUTH # [id number], GOOD FROM 02/16/2021 - 04/17/2021.  REF # [id number]   SPOKE WITH KEYOJANNA WITH [employer] AND SHE UPDATED THE NEW SX DATE AND EXTENDED THE DATE RANGE. CPT CODE 63875 HAS BEEN APPROVED, AUTH # [id number], GOOD FROM 02/16/2021 - 05/09/2021.  REF # [id number]

## 2020-12-14 ENCOUNTER — Ambulatory Visit (HOSPITAL_COMMUNITY)
Admission: EM | Admit: 2020-12-14 | Discharge: 2020-12-14 | Disposition: A | Discharge status: Home / Self Care | Payer: Medicaid Other | Attending: Medical Oncology | Admitting: Medical Oncology

## 2020-12-14 ENCOUNTER — Encounter: Payer: Medicaid Other | Admitting: Podiatry

## 2020-12-14 ENCOUNTER — Other Ambulatory Visit: Payer: Self-pay

## 2020-12-14 ENCOUNTER — Encounter (HOSPITAL_COMMUNITY): Payer: Self-pay | Admitting: *Deleted

## 2020-12-14 DIAGNOSIS — S4992XA Unspecified injury of left shoulder and upper arm, initial encounter: Secondary | ICD-10-CM | POA: Diagnosis not present

## 2020-12-14 MED ORDER — ETODOLAC 400 MG PO TABS: 400.0000 mg | ORAL_TABLET | Freq: Two times a day (BID) | ORAL | 0 refills | Status: DC

## 2020-12-14 NOTE — ED Provider Notes (Addendum)
[facility]    CSN: [id number] Arrival date & time: 12/14/20  1816      History   Chief Complaint Chief Complaint  Patient presents with   Arm Injury    HPI Christine Vega is a 50 y.o. female.   HPI  Arm Injury: Patient reports that on Friday she had her left arm accidentally caught in an elevator.  She states that it squeezed the side of her arm with the door but then reopened.  She states that she did not seek help that day but did have discomfort.  She states that the discomfort has improved but she does have a bit of numbness and tingling in her fingers and some tenderness to palpation of her bicep and tricep muscle.  She has used Tylenol with mild relief.  There is no skin breakdown, significant bruising or bony abnormality. No former injury.   Past Medical History:  Diagnosis Date   Anemia    history    Anxiety    Back pain    rotates to left hip and tailbone   Depression    DUB (dysfunctional uterine bleeding) 12/01/2010   Endometriosis    Fractured coccyx (HCC)    Heart murmur    no problems   History of seasonal allergies    Hyperlipidemia    Migraine    Migraines    tx w/depakote/verapamil per pt   Murmur, heart    Neuromuscular disorder ([place])    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, walks/balance ok   Post traumatic stress disorder (PTSD)    Seizures ([place]) 2004   x 1; unknown source   Uterine leiomyoma     Patient Active Problem List   Diagnosis Date Noted   Nasal turbinate hypertrophy 05/14/2017   Iron deficiency anemia due to chronic blood loss 04/05/2016   Cervical high risk HPV (human papillomavirus) test positive, normal cytology on 02/24/14 03/01/2014   Atypical migraine 06/24/2013   Migraine without aura 06/24/2013   Vaginal pain 12/01/2010    Past Surgical History:  Procedure Laterality Date   CHOLECYSTECTOMY     HERNIA REPAIR     HYSTERECTOMY ABDOMINAL WITH SALPINGECTOMY Left 02/07/2016   Procedure: HYSTERECTOMY ABDOMINAL WITH  left  SALPINGECTOMY;  Surgeon: Mora Bellman, MD;  Location: [facility];  Service: Gynecology;  Laterality: Left;   LAPAROSCOPY  05/16/2011   Procedure: LAPAROSCOPY OPERATIVE;  Surgeon: Emeterio Reeve, MD;  Location: [facility];  Service: Gynecology;  Laterality: N/A;  poss oophorectomy   LAPAROSCOPY  08/06/2011   Procedure: LAPAROSCOPY OPERATIVE;  Surgeon: Woodroe Mode, MD;  Location: [facility];  Service: Gynecology;  Laterality: N/A;   LAPAROTOMY     for endometriosis/adhesions   SALPINGOOPHORECTOMY  08/06/2011   Procedure: SALPINGO OOPHERECTOMY;  Surgeon: Woodroe Mode, MD;  Location: [facility];  Service: Gynecology;  Laterality: Right;   SVD     x 2   TUBAL LIGATION     WISDOM TOOTH EXTRACTION      OB History     Gravida  2   Para  2   Term  2   Preterm  0   AB  0   Living  2      SAB  0   IAB  0   Ectopic  0   Multiple  0   Live Births  2            Home Medications    Prior to Admission medications   Medication  Sig Start Date End Date Taking? Authorizing Provider  AMITIZA 24 MCG capsule Take 24 mcg by mouth 2 (two) times daily. 11/09/19   [provider]  Atogepant (QULIPTA) 60 MG TABS Take 60 mg by mouth daily. 03/21/20   Jaffe, Adam R, DO  BOTOX 200 units SOLR INJECT 200 UNITS AS DIRECTED ONCE FOR 1 DOSE. Patient not taking: Reported on 08/12/2020 08/28/19   [provider]  buPROPion (WELLBUTRIN XL) 150 MG 24 hr tablet Take 150 mg by mouth daily.    [provider]  butalbital-acetaminophen-caffeine (FIORICET, ESGIC) 50-325-40 MG tablet Take 1 tablet by mouth every 8 (eight) hours as needed for migraine.    [provider]  cyclobenzaprine (FLEXERIL) 5 MG tablet Take 1 tablet (5 mg total) by mouth 3 (three) times daily as needed for muscle spasms. 05/07/18   Drenda Freeze, MD  eletriptan (RELPAX) 40 MG tablet TAKE 1 TABLET BY MOUTH AS NEEDED FOR MIGRAINE OR HEADACHE, MAY REPEAT IN 2 HOURS IF HEADACHE PERSISTS OR RECURS Patient not  taking: Reported on 08/12/2020 08/18/19   Pieter Partridge, DO  fluticasone (FLONASE) 50 MCG/ACT nasal spray SHAKE LIQUID AND USE 2 SPRAYS IN [facility] NOSTRIL DAILY 10/14/18   [provider]  gabapentin (NEURONTIN) 300 MG capsule Take 300 mg by mouth 3 (three) times daily.    [provider]  Galcanezumab-gnlm (EMGALITY) 120 MG/ML SOAJ Inject 120 mg into the skin every 28 (twenty-eight) days. 10/13/20   Pieter Partridge, DO  hydrOXYzine (ATARAX/VISTARIL) 50 MG tablet Take 50 mg by mouth 3 (three) times daily as needed. 11/09/19   [provider]  lamoTRIgine (LAMICTAL) 100 MG tablet Take 100 mg by mouth daily. 11/09/19   [provider]  meloxicam (MOBIC) 15 MG tablet Take 1 tablet (15 mg total) by mouth daily. Patient not taking: Reported on 08/12/2020 07/31/17   Jaynee Eagles, PA-C  naproxen (NAPROSYN) 500 MG tablet Take 1 tablet (500 mg total) by mouth every 12 (twelve) hours as needed. 11/18/17   Pieter Partridge, DO  omeprazole (PRILOSEC) 40 MG capsule Take 40 mg by mouth daily. 11/09/19   [provider]  ondansetron (ZOFRAN) 4 MG tablet Take 1 tablet (4 mg total) by mouth every 8 (eight) hours as needed for nausea or vomiting. 02/23/19   Pieter Partridge, DO  predniSONE (DELTASONE) 20 MG tablet Take 3 tabs on day 1, 2-1/2 tabs on day 2, 2 tabs on day 3, 1-1/2 tabs on day 4, 1 tab on day 5, 1/2 tab on days 6 and 7, then stop 10/06/20   Cameron Sprang, MD  prochlorperazine (COMPAZINE) 10 MG tablet Take 10 mg by mouth every 6 (six) hours as needed for nausea or vomiting.    [provider]  promethazine (PHENERGAN) 50 MG suppository Place 50 mg rectally every 8 (eight) hours as needed for nausea or vomiting.  Patient not taking: Reported on 08/12/2020    [provider]  propranolol ER (INDERAL LA) 120 MG 24 hr capsule Take 120 mg by mouth daily.    [provider]  Rimegepant Sulfate (NURTEC) 75 MG TBDP Take 1 tablet by mouth daily as needed. Patient not  taking: No sig reported 03/04/19   Pieter Partridge, DO  Rimegepant Sulfate (NURTEC) 75 MG TBDP Take 75 mg by mouth as needed. Patient not taking: Reported on 08/12/2020 07/08/19   Pieter Partridge, DO  sertraline (ZOLOFT) 100 MG tablet Take 100 mg by mouth daily.  07/25/11   [provider]  simvastatin (ZOCOR) 40 MG tablet Take 40 mg by mouth daily. 08/15/19   [provider]  tiZANidine (ZANAFLEX) 4 MG tablet Take 4 mg by mouth every 12 (twelve) hours as needed for muscle spasms.     [provider]    Family History Family History  Problem Relation Age of Onset   Migraines Mother    Heart disease Father    Migraines Father    Migraines Daughter    Breast cancer Neg Hx     Social History Social History   Tobacco Use   Smoking status: Never   Smokeless tobacco: Never  Vaping Use   Vaping Use: Never used  Substance Use Topics   Alcohol use: No    Alcohol/week: 0.0 standard drinks   Drug use: No     Allergies   Penicillins, Lipitor [atorvastatin calcium], and Zithromax [azithromycin]   Review of Systems Review of Systems  As stated above in HPI Physical Exam Triage Vital Signs ED Triage Vitals  Enc Vitals Group     BP 12/14/20 1939 127/82     Pulse Rate 12/14/20 1939 88     Resp 12/14/20 1939 20     Temp 12/14/20 1939 98.7 F (37.1 C)     Temp src --      SpO2 12/14/20 1939 98 %     Weight --      Height --      Head Circumference --      Peak Flow --      Pain Score 12/14/20 1936 9     Pain Loc --      Pain Edu? --      Excl. in [place]? --    No data found.  Updated Vital Signs BP 127/82   Pulse 88   Temp 98.7 F (37.1 C)   Resp 20   LMP 01/13/2016 (Exact Date)   SpO2 98%   Physical Exam Vitals and nursing note reviewed.  Constitutional:      Appearance: Normal appearance.  Cardiovascular:     Pulses: Normal pulses.  Musculoskeletal:        General: Swelling (Mild of the left bicep muscle over hematoma) and tenderness (left  bicep muscle over the hematoma) present. No deformity. Normal range of motion.  Skin:    General: Skin is warm.     Capillary Refill: Capillary refill takes less than 2 seconds.     Coloration: Skin is not jaundiced.  Neurological:     General: No focal deficit present.     Mental Status: She is alert and oriented to person, place, and time.     Sensory: No sensory deficit.     Motor: No weakness.     Coordination: Coordination normal.     UC Treatments / Results  Labs (all labs ordered are listed, but only abnormal results are displayed) Labs Reviewed - No data to display  EKG   Radiology No results found.  Procedures Procedures (including critical care time)  Medications Ordered in UC Medications - No data to display  Initial Impression / Assessment and Plan / UC Course  I have reviewed the triage vital signs and the nursing notes.  Pertinent labs & imaging results that were available during my care of the patient were reviewed by me and considered in my medical decision making (see chart for details).     New.  She has a hematoma of the left arm in  the area of her most discomfort.  Negative fracture testing of the arm throughout.  For now I am recommending that she start an NSAID and uses a heating pad on the hematoma.  We discussed not taking the NSAID with her Mobic and to take with food and water to avoid GI upset and bleed.  We discussed the hematoma and that this may take a few days to a few weeks to resolve fully on its own.  In terms of her mild numbness and tingling we discussed that it is potential that the nerve was irritated that this should resolve on its own over time and with the help with NSAID.  If symptoms have not improved significantly over the next week and a half she should follow-up with orthopedics or sooner as needed. Final Clinical Impressions(s) / UC Diagnoses   Final diagnoses:  None   Discharge Instructions   None    ED Prescriptions    None    PDMP not reviewed this encounter.   Hughie Closs, Hershal Coria 12/14/20 2018    Hughie Closs, PA-C 12/14/20 2019

## 2020-12-14 NOTE — ED Triage Notes (Addendum)
Pt reports her Lt upper arm was closed in an elevator door . Pt reports swelling to arm has improved.

## 2020-12-28 ENCOUNTER — Telehealth: Payer: Self-pay

## 2020-12-28 NOTE — Telephone Encounter (Signed)
Christine Vega called to reschedule her surgery with Dr. Paulla Dolly from 01/03/2021 to 03/07/2021. She stated she has something to do and needs to wait till November to have her surgery. Notified Dr. Paulla Dolly and Caren Griffins with [facility]

## 2021-01-09 ENCOUNTER — Encounter: Payer: Medicaid Other | Admitting: Podiatrist

## 2021-01-24 ENCOUNTER — Other Ambulatory Visit: Payer: Self-pay | Admitting: Internal Medicine

## 2021-01-24 ENCOUNTER — Other Ambulatory Visit: Payer: Self-pay | Admitting: Family Medicine

## 2021-01-24 DIAGNOSIS — Z1231 Encounter for screening mammogram for malignant neoplasm of breast: Secondary | ICD-10-CM

## 2021-01-25 ENCOUNTER — Ambulatory Visit: Payer: Medicaid Other | Admitting: Advanced Practice Midwife

## 2021-01-25 NOTE — Progress Notes (Signed)
Virtual Visit via Video Note The purpose of this virtual visit is to provide medical care while limiting exposure to the novel coronavirus.    Consent was obtained for video visit:  Yes.   Answered questions that patient had about telehealth interaction:  Yes.   I discussed the limitations, risks, security and privacy concerns of performing an evaluation and management service by telemedicine. I also discussed with the patient that there may be a patient responsible charge related to this service. The patient expressed understanding and agreed to proceed.  Pt location: Home Physician Location: office Name of referring provider:  [name], MD I connected with Christine Vega at patients initiation/request on 01/27/2021 at  1:30 PM EDT by video enabled telemedicine application and verified that I am speaking with the correct person using two identifiers. Pt MRN:  [id number] Pt DOB:  [birth year] Video Participants:  Christine Vega;  Assessment and Plan:   Chronic migraine  Migraine prevention:  In addition to Emgality, start zonisamide titrating to 100mg  daily Migraine rescue:  Reyvow.  Zofran for nausea Limit use of pain relievers to no more than 2 days out of week to prevent risk of rebound or medication-overuse headache. Keep headache diary Follow up 6 months  History of Present Illness:  Christine Vega is a 50 year old woman with migraines and chronic pain who follows up for migraine.   UPDATE: Christine Vega was denied so she was started on Emgality.  Reyvow Intensity:  severe Duration:  2 to 2 and 1/2 days.   Frequency:  14 days a month Frequency of abortive medication: Fioricet 13 to 14 days a month. Current NSAIDS:  etodolac, naproxen Current analgesics:  none Current triptans/ergot:   none Current anti-emetic:  Zofran 4mg  Current muscle relaxants:  tizanidine 4mg  Current anti-anxiolytic:  no Current sleep aide:  no Current Antihypertensive medications:  Propranolol  ER 120mg  Current Antidepressant medications:  bupropion, sertraline 100mg  Current Anticonvulsant medications:gabapentin 300mg  three times daily, lamotrigine 100mg  daily Current CGRP inhibitor:  Emgality Current Vitamins/Herbal/Supplements:  D, E Current Antihistamines/Decongestants:  hydroxyzine, Flonase Other therapy:  none   Caffeine:  Rarely coffee, tea, soda Alcohol:  no Smoker:  no Diet:  Hydrates Exercise:  yes Depression/anxiety:  stable Sleep hygiene:  poor    HISTORY: Onset: 50 years old Location:  Either temple Quality:  Throbbing, pressure Initial Intensity:  10/10 Aura:  no Prodrome:  no Associated symptoms: Nausea, vomiting, photophobia, phonophobia, osmophobia, blurred vision Initial Duration:  3 days  Initial Frequency:  16 headache days a month Triggers: Certain smells, chocolate , garlic Relieving factors: None Activity:  Cannot function   Past NSAIDS:  Ibuprofen, naproxen Past analgesics:  Tylenol, oxycodone, Excedrin, Fioricet Past abortive triptans/ergot:  Sumatriptan (tablet and injection), Maxalt, Zomig NS, Migranal NS, Relpax Past muscle relaxants:  Flexeril Past anti-emetic:  promethazine 50mg  PR, Compazine Past anti-anxiolytic:  lorazepam Past antihypertensive medications:  verapamil Past antidepressant medications:  nortriptyline Past anticonvulsant medications:  Topiramate, Lyrica Past CGRP inhibitor:  Aimovig 70mg  (makes her feel weird); Roselyn Meier (makes her feel queasy, ineffective), Nurtec, Qulipta Past vitamins/Herbal/Supplements:  No Other therapy:  Botox   Family history of headache:  Mother, daughters   CT of head and cervical spine from 12/20/15 were personally reviewed and were negative for acute changes.  Cervical spine did demonstrate degenerative disc disease at C5-6 and C6-7.  Past Medical History: Past Medical History:  Diagnosis Date   Anemia    history    Anxiety  Back pain    rotates to left hip and tailbone   Depression     DUB (dysfunctional uterine bleeding) 12/01/2010   Endometriosis    Fractured coccyx (HCC)    Heart murmur    no problems   History of seasonal allergies    Hyperlipidemia    Migraine    Migraines    tx w/depakote/verapamil per pt   Murmur, heart    Neuromuscular disorder ([place])    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, walks/balance ok   Post traumatic stress disorder (PTSD)    Seizures ([place]) 2004   x 1; unknown source   Uterine leiomyoma     Medications: Outpatient Encounter Medications as of 01/27/2021  Medication Sig   AMITIZA 24 MCG capsule Take 24 mcg by mouth 2 (two) times daily.   buPROPion (WELLBUTRIN XL) 150 MG 24 hr tablet Take 150 mg by mouth daily.   butalbital-acetaminophen-caffeine (FIORICET, ESGIC) 50-325-40 MG tablet Take 1 tablet by mouth every 8 (eight) hours as needed for migraine.   eletriptan (RELPAX) 40 MG tablet TAKE 1 TABLET BY MOUTH AS NEEDED FOR MIGRAINE OR HEADACHE, MAY REPEAT IN 2 HOURS IF HEADACHE PERSISTS OR RECURS   etodolac (LODINE) 400 MG tablet Take 1 tablet (400 mg total) by mouth 2 (two) times daily.   fluticasone (FLONASE) 50 MCG/ACT nasal spray SHAKE LIQUID AND USE 2 SPRAYS IN EACH NOSTRIL DAILY   gabapentin (NEURONTIN) 300 MG capsule Take 300 mg by mouth 3 (three) times daily.   Galcanezumab-gnlm (EMGALITY) 120 MG/ML SOAJ Inject 120 mg into the skin every 28 (twenty-eight) days.   hydrOXYzine (ATARAX/VISTARIL) 50 MG tablet Take 50 mg by mouth 3 (three) times daily as needed.   lamoTRIgine (LAMICTAL) 100 MG tablet Take 100 mg by mouth daily.   naproxen (NAPROSYN) 500 MG tablet Take 1 tablet (500 mg total) by mouth every 12 (twelve) hours as needed.   omeprazole (PRILOSEC) 40 MG capsule Take 40 mg by mouth daily.   ondansetron (ZOFRAN) 4 MG tablet Take 1 tablet (4 mg total) by mouth every 8 (eight) hours as needed for nausea or vomiting.   prochlorperazine (COMPAZINE) 10 MG tablet Take 10 mg by mouth every 6 (six) hours as needed for nausea or vomiting.    propranolol ER (INDERAL LA) 120 MG 24 hr capsule Take 120 mg by mouth daily.   sertraline (ZOLOFT) 100 MG tablet Take 100 mg by mouth daily.   simvastatin (ZOCOR) 40 MG tablet Take 40 mg by mouth daily.   tiZANidine (ZANAFLEX) 4 MG tablet Take 4 mg by mouth every 12 (twelve) hours as needed for muscle spasms.    Atogepant (QULIPTA) 60 MG TABS Take 60 mg by mouth daily. (Patient not taking: Reported on 01/27/2021)   BOTOX 200 units SOLR INJECT 200 UNITS AS DIRECTED ONCE FOR 1 DOSE. (Patient not taking: No sig reported)   cyclobenzaprine (FLEXERIL) 5 MG tablet Take 1 tablet (5 mg total) by mouth 3 (three) times daily as needed for muscle spasms.   promethazine (PHENERGAN) 50 MG suppository Place 50 mg rectally every 8 (eight) hours as needed for nausea or vomiting.  (Patient not taking: No sig reported)   Rimegepant Sulfate (NURTEC) 75 MG TBDP Take 1 tablet by mouth daily as needed. (Patient not taking: No sig reported)   Rimegepant Sulfate (NURTEC) 75 MG TBDP Take 75 mg by mouth as needed. (Patient not taking: No sig reported)   [DISCONTINUED] predniSONE (DELTASONE) 20 MG tablet Take 3 tabs on day  1, 2-1/2 tabs on day 2, 2 tabs on day 3, 1-1/2 tabs on day 4, 1 tab on day 5, 1/2 tab on days 6 and 7, then stop   No facility-administered encounter medications on file as of 01/27/2021.    Allergies: Allergies  Allergen Reactions   Penicillins Hives, Itching and Other (See Comments)    Has patient had a PCN reaction causing immediate rash, facial/tongue/throat swelling, SOB or lightheadedness with hypotension: No Has patient had a PCN reaction causing severe rash involving mucus membranes or skin necrosis: No Has patient had a PCN reaction that required hospitalization: No Has patient had a PCN reaction occurring within the last 10 years: Yes If all of the above answers are "NO", then may proceed with Cephalosporin use.    Lipitor [Atorvastatin Calcium]    Zithromax [Azithromycin] Hives and  Itching    Family History: Family History  Problem Relation Age of Onset   Migraines Mother    Heart disease Father    Migraines Father    Migraines Daughter    Breast cancer Neg Hx     Observations/Objective:   Last menstrual period 01/13/2016. No acute distress.  Alert and oriented.  Speech fluent and not dysarthric.  Language intact.     Follow Up Instructions:    -I discussed the assessment and treatment plan with the patient. The patient was provided an opportunity to ask questions and all were answered. The patient agreed with the plan and demonstrated an understanding of the instructions.   The patient was advised to call back or seek an in-person evaluation if the symptoms worsen or if the condition fails to improve as anticipated.  Dudley Major, DO

## 2021-01-27 ENCOUNTER — Telehealth (INDEPENDENT_AMBULATORY_CARE_PROVIDER_SITE_OTHER): Payer: Medicaid Other | Admitting: Neurology

## 2021-01-27 ENCOUNTER — Encounter: Payer: Self-pay | Admitting: Neurology

## 2021-01-27 ENCOUNTER — Other Ambulatory Visit: Payer: Self-pay

## 2021-01-27 DIAGNOSIS — G43709 Chronic migraine without aura, not intractable, without status migrainosus: Secondary | ICD-10-CM | POA: Diagnosis not present

## 2021-01-27 MED ORDER — REYVOW 100 MG PO TABS: 100.0000 mg | ORAL_TABLET | Freq: Every day | ORAL | 5 refills | Status: DC | PRN

## 2021-01-27 MED ORDER — ZONISAMIDE 25 MG PO CAPS: ORAL_CAPSULE | ORAL | 0 refills | Status: DC

## 2021-01-27 NOTE — Patient Instructions (Signed)
Zoniasmide Reyvow

## 2021-01-30 ENCOUNTER — Telehealth: Payer: Self-pay

## 2021-01-30 NOTE — Telephone Encounter (Signed)
New message   Your information has been sent to [facility].   Christine Vega (Key: [id number]) Reyvow 100MG  tablets   Form [employer] of [employer] Electronic Prior Authorization Request Form [phone] NCPDP) Created 10 minutes ago Sent to Plan 8 minutes ago Plan Response 8 minutes ago Submit Clinical Questions 1 minute ago Determination Wait for Determination Please wait for [facility] Medicaid 2017 to return a determination.

## 2021-01-31 NOTE — Telephone Encounter (Signed)
F/u   Christine Vega (Key: [id number]) Reyvow 100MG  tablets   Form [employer] of [place] Prior Authorization Request Form [phone] NCPDP) Created 21 hours ago Sent to Plan 21 hours ago Plan Response 21 hours ago Submit Clinical Questions 21 hours ago Determination Favorable 20 hours ago Message from Plan Approved. This drug has been approved. Approved quantity: 8 tablets per 8 day(s). You may fill up to a 34 day supply at a retail pharmacy. You may fill up to a 90 day supply for maintenance drugs, please refer to the formulary for details. Please call the pharmacy to process your prescription claim.

## 2021-02-15 ENCOUNTER — Telehealth: Payer: Self-pay

## 2021-02-15 NOTE — Telephone Encounter (Signed)
New message   Your information has been sent to [facility].  Barry Brunner Key: [id number] - PA Case ID: [id number] - Rx #: [id number] Need help? Call us at [phone] Status Sent to [employer] [facility] Emgality 120MG /ML auto-injectors (migraine) Form [facility] Medicaid of [employer] Prior Authorization Request Form [phone] NCPDP) Original Claim Info

## 2021-02-15 NOTE — Telephone Encounter (Signed)
F/u   Outcome Approved today  Approved. This drug has been approved. Approved quantity: 1 ml per 30 day(s). You may fill up to a 34 day supply at a retail pharmacy. You may fill up to a 90 day supply for maintenance drugs, please refer to the formulary for details. Please call the pharmacy to process your prescription claim.  Drug Emgality 120MG /ML auto-injectors (migraine)

## 2021-02-16 ENCOUNTER — Telehealth: Payer: Self-pay | Admitting: Neurology

## 2021-02-17 NOTE — Telephone Encounter (Signed)
F/u    Outcome Approved today   Approved. This drug has been approved. Approved quantity: 1 ml per 30 day(s). You may fill up to a 34 day supply at a retail pharmacy. You may fill up to a 90 day supply for maintenance drugs, please refer to the formulary for details. Please call the pharmacy to process your prescription claim.   Drug Emgality 120MG /ML auto-injectors (migraine)

## 2021-03-02 ENCOUNTER — Telehealth: Payer: Self-pay | Admitting: Neurology

## 2021-03-02 MED ORDER — EMGALITY 120 MG/ML ~~LOC~~ SOAJ: 120.0000 mg | SUBCUTANEOUS | 5 refills | Status: DC

## 2021-03-02 NOTE — Telephone Encounter (Signed)
Per [facility] they only have the syringes prefilled and not the autoinjector.   [facility], They do have the Autoinjector in stock.  Pt advised she can not pick up [facility] since er script written for the autoinjector. If we change the script  we will have to go and do another PA which could take up to  a week to get approved.  Per pt [facility] to [facility] so she cn pick up today.  Advised pt is she has any issues please give [place] a call back.

## 2021-03-02 NOTE — Telephone Encounter (Signed)
Patient called requesting to speak with Vita Barley about her Emgality not getting approved. She is not sure what to do now.

## 2021-03-03 ENCOUNTER — Ambulatory Visit: Payer: Medicaid Other

## 2021-03-03 ENCOUNTER — Telehealth: Payer: Self-pay

## 2021-03-03 NOTE — Telephone Encounter (Signed)
Christine Vega called wanting to reschedule her surgery with Dr. Paulla Dolly on 03/07/2021. She stated she wants to spend time with her family during the holidays and they live several hours away. I have her rescheduled to 05/09/2021. Notified Dr. Paulla Dolly and Caren Griffins with [facility]

## 2021-03-07 ENCOUNTER — Other Ambulatory Visit: Payer: Self-pay | Admitting: Neurology

## 2021-03-08 ENCOUNTER — Other Ambulatory Visit: Payer: Self-pay | Admitting: Neurology

## 2021-03-08 MED ORDER — ZONISAMIDE 100 MG PO CAPS: 100.0000 mg | ORAL_CAPSULE | Freq: Every day | ORAL | 5 refills | Status: DC

## 2021-03-13 ENCOUNTER — Encounter: Payer: Medicaid Other | Admitting: Podiatry

## 2021-04-04 ENCOUNTER — Emergency Department (HOSPITAL_COMMUNITY)
Admission: EM | Admit: 2021-04-04 | Discharge: 2021-04-05 | Disposition: A | Discharge status: Home / Self Care | Payer: Medicaid Other | Attending: Emergency Medicine | Admitting: Emergency Medicine

## 2021-04-04 ENCOUNTER — Emergency Department (HOSPITAL_COMMUNITY): Payer: Medicaid Other

## 2021-04-04 DIAGNOSIS — M542 Cervicalgia: Secondary | ICD-10-CM | POA: Insufficient documentation

## 2021-04-04 DIAGNOSIS — R609 Edema, unspecified: Secondary | ICD-10-CM | POA: Insufficient documentation

## 2021-04-04 LAB — BASIC METABOLIC PANEL
Anion gap: 9 (ref 5–15)
BUN: 7 mg/dL (ref 6–20)
CO2: 24 mmol/L (ref 22–32)
Calcium: 9.7 mg/dL (ref 8.9–10.3)
Chloride: 105 mmol/L (ref 98–111)
Creatinine, Ser: 0.71 mg/dL (ref 0.44–1.00)
GFR, Estimated: >60 mL/min (ref >60)
Glucose, Bld: 131 mg/dL — ABNORMAL HIGH (ref 70–99)
Potassium: 4.3 mmol/L (ref 3.5–5.1)
Sodium: 138 mmol/L (ref 135–145)

## 2021-04-04 LAB — CBC WITH DIFFERENTIAL/PLATELET
Abs Immature Granulocytes: 0.01 10*3/uL (ref 0.00–0.07)
Basophils Absolute: 0 10*3/uL (ref 0.0–0.1)
Basophils Relative: 0 %
Eosinophils Absolute: 0 10*3/uL (ref 0.0–0.5)
Eosinophils Relative: 0 %
HCT: 40.7 % (ref 36.0–46.0)
Hemoglobin: 12.8 g/dL (ref 12.0–15.0)
Immature Granulocytes: 0 %
Lymphocytes Relative: 20 %
Lymphs Abs: 1.3 10*3/uL (ref 0.7–4.0)
MCH: 27.5 pg (ref 26.0–34.0)
MCHC: 31.4 g/dL (ref 30.0–36.0)
MCV: 87.3 fL (ref 80.0–100.0)
Monocytes Absolute: 0.3 10*3/uL (ref 0.1–1.0)
Monocytes Relative: 5 %
Neutro Abs: 5.1 10*3/uL (ref 1.7–7.7)
Neutrophils Relative %: 75 %
Platelets: 293 10*3/uL (ref 150–400)
RBC: 4.66 MIL/uL (ref 3.87–5.11)
RDW: 14.4 % (ref 11.5–15.5)
WBC: 6.8 10*3/uL (ref 4.0–10.5)
nRBC: 0 % (ref 0.0–0.2)

## 2021-04-04 LAB — TSH: TSH: 0.704 u[IU]/mL (ref 0.350–4.500)

## 2021-04-04 NOTE — ED Triage Notes (Signed)
Pt c/o neck soreness, swelling x3 days. States she may have "tweaked it" when sleeping. Hx broken neck in 2005. No other complaints

## 2021-04-04 NOTE — ED Provider Notes (Signed)
Emergency Medicine Provider Triage Evaluation Note  Christine Vega , a 50 y.o. female  was evaluated in triage.  Pt complains of neck pain.  Hx of C3 fx.  Denies any prior neck surgery.  States that she thinks she slept on it "funny" the other night.  She denies any recent illnesses.   She is concerned about the neck feeling swollen and her thyroid.  Review of Systems  Positive: Neck swelling, pain Negative: Fever, chills  Physical Exam  LMP 01/13/2016 (Exact Date)  Gen:   Awake, no distress   Resp:  Normal effort  MSK:   Moves extremities without difficulty  Other:  No obvious deformity  Medical Decision Making  Medically screening exam initiated at 10:08 PM.  Appropriate orders placed.  SHARIYAH ELAND was informed that the remainder of the evaluation will be completed by another provider, this initial triage assessment does not replace that evaluation, and the importance of remaining in the ED until their evaluation is complete.  Neck pain   Montine Circle, PA-C 04/04/21 2211    Tegeler, Gwenyth Allegra, MD 04/04/21 2322

## 2021-04-05 ENCOUNTER — Ambulatory Visit: Payer: Medicaid Other

## 2021-04-05 MED ORDER — METHOCARBAMOL 500 MG PO TABS: 500.0000 mg | ORAL_TABLET | Freq: Two times a day (BID) | ORAL | 0 refills | Status: DC

## 2021-04-05 MED ORDER — ACETAMINOPHEN ER 650 MG PO TBCR: 650.0000 mg | EXTENDED_RELEASE_TABLET | Freq: Three times a day (TID) | ORAL | 0 refills | Status: DC | PRN

## 2021-04-05 MED ORDER — ACETAMINOPHEN 325 MG PO TABS
650.0000 mg | ORAL_TABLET | Freq: Once | ORAL | Status: AC
  Administered 2021-04-05: 09:00:00 650 mg via ORAL
  Filled 2021-04-05: qty 2

## 2021-04-05 NOTE — ED Provider Notes (Signed)
[facility] EMERGENCY DEPARTMENT Provider Note   CSN: [id number] Arrival date & time: 04/04/21  2043     History Chief Complaint  Patient presents with   Neck Pain    Christine Vega is a 50 y.o. female with a past medical history as noted below who presents to the ED due to progressively worsening anterior neck pain for the past few days.  Patient has a history of a C3 fracture in 2005.  Anterior neck pain associated with mild edema.  Patient is concerned about her thyroid due to family history of thyroid issues. No personal history. Denies difficulty swallowing or breathing.  Denies headache, dizziness, and upper extremity numbness/tingling.  Denies chest pain and shortness of breath.  Patient states she slept on her neck "funny" the other night which she believes caused the pain.  Denies recent illness.  No fever or chills.  She has not tried any thing for symptoms.  Pain worse with movement of neck and palpation.  History obtained from patient and past medical records. No interpreter used during encounter.       Past Medical History:  Diagnosis Date   Anemia    history    Anxiety    Back pain    rotates to left hip and tailbone   Depression    DUB (dysfunctional uterine bleeding) 12/01/2010   Endometriosis    Fractured coccyx (HCC)    Heart murmur    no problems   History of seasonal allergies    Hyperlipidemia    Migraine    Migraines    tx w/depakote/verapamil per pt   Murmur, heart    Neuromuscular disorder ([place])    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, walks/balance ok   Post traumatic stress disorder (PTSD)    Seizures ([place]) 2004   x 1; unknown source   Uterine leiomyoma     Patient Active Problem List   Diagnosis Date Noted   Nasal turbinate hypertrophy 05/14/2017   Iron deficiency anemia due to chronic blood loss 04/05/2016   Cervical high risk HPV (human papillomavirus) test positive, normal cytology on 02/24/14 03/01/2014   Atypical migraine  06/24/2013   Migraine without aura 06/24/2013   Vaginal pain 12/01/2010    Past Surgical History:  Procedure Laterality Date   CHOLECYSTECTOMY     HERNIA REPAIR     HYSTERECTOMY ABDOMINAL WITH SALPINGECTOMY Left 02/07/2016   Procedure: HYSTERECTOMY ABDOMINAL WITH  left SALPINGECTOMY;  Surgeon: Mora Bellman, MD;  Location: [facility];  Service: Gynecology;  Laterality: Left;   LAPAROSCOPY  05/16/2011   Procedure: LAPAROSCOPY OPERATIVE;  Surgeon: Emeterio Reeve, MD;  Location: [facility];  Service: Gynecology;  Laterality: N/A;  poss oophorectomy   LAPAROSCOPY  08/06/2011   Procedure: LAPAROSCOPY OPERATIVE;  Surgeon: Woodroe Mode, MD;  Location: [facility];  Service: Gynecology;  Laterality: N/A;   LAPAROTOMY     for endometriosis/adhesions   SALPINGOOPHORECTOMY  08/06/2011   Procedure: SALPINGO OOPHERECTOMY;  Surgeon: Woodroe Mode, MD;  Location: [facility];  Service: Gynecology;  Laterality: Right;   SVD     x 2   TUBAL LIGATION     WISDOM TOOTH EXTRACTION       OB History     Gravida  2   Para  2   Term  2   Preterm  0   AB  0   Living  2      SAB  0   IAB  0  Ectopic  0   Multiple  0   Live Births  2           Family History  Problem Relation Age of Onset   Migraines Mother    Heart disease Father    Migraines Father    Migraines Daughter    Breast cancer Neg Hx     Social History   Tobacco Use   Smoking status: Never   Smokeless tobacco: Never  Vaping Use   Vaping Use: Never used  Substance Use Topics   Alcohol use: No    Alcohol/week: 0.0 standard drinks   Drug use: No    Home Medications Prior to Admission medications   Medication Sig Start Date End Date Taking? Authorizing Provider  acetaminophen (TYLENOL 8 HOUR) 650 MG CR tablet Take 1 tablet (650 mg total) by mouth every 8 (eight) hours as needed for pain. 04/05/21  Yes Yaser Harvill, Druscilla Brownie, PA-C  methocarbamol (ROBAXIN) 500 MG tablet Take 1 tablet (500 mg total) by mouth 2 (two) times daily.  04/05/21  Yes Avalina Benko C, PA-C  AMITIZA 24 MCG capsule Take 24 mcg by mouth 2 (two) times daily. 11/09/19   [provider]  Atogepant (QULIPTA) 60 MG TABS Take 60 mg by mouth daily. Patient not taking: Reported on 01/27/2021 03/21/20   Metta Clines R, DO  BOTOX 200 units SOLR INJECT 200 UNITS AS DIRECTED ONCE FOR 1 DOSE. Patient not taking: No sig reported 08/28/19   [provider]  buPROPion (WELLBUTRIN XL) 150 MG 24 hr tablet Take 150 mg by mouth daily.    [provider]  butalbital-acetaminophen-caffeine (FIORICET, ESGIC) 50-325-40 MG tablet Take 1 tablet by mouth every 8 (eight) hours as needed for migraine.    [provider]  cyclobenzaprine (FLEXERIL) 5 MG tablet Take 1 tablet (5 mg total) by mouth 3 (three) times daily as needed for muscle spasms. 05/07/18   Drenda Freeze, MD  eletriptan (RELPAX) 40 MG tablet TAKE 1 TABLET BY MOUTH AS NEEDED FOR MIGRAINE OR HEADACHE, MAY REPEAT IN 2 HOURS IF HEADACHE PERSISTS OR RECURS 08/18/19   Pieter Partridge, DO  etodolac (LODINE) 400 MG tablet Take 1 tablet (400 mg total) by mouth 2 (two) times daily. 12/14/20   Hughie Closs, PA-C  fluticasone (FLONASE) 50 MCG/ACT nasal spray SHAKE LIQUID AND USE 2 SPRAYS IN [facility] NOSTRIL DAILY 10/14/18   [provider]  gabapentin (NEURONTIN) 300 MG capsule Take 300 mg by mouth 3 (three) times daily.    [provider]  Galcanezumab-gnlm (EMGALITY) 120 MG/ML SOAJ Inject 120 mg into the skin every 28 (twenty-eight) days. 03/02/21   Pieter Partridge, DO  hydrOXYzine (ATARAX/VISTARIL) 50 MG tablet Take 50 mg by mouth 3 (three) times daily as needed. 11/09/19   [provider]  lamoTRIgine (LAMICTAL) 100 MG tablet Take 100 mg by mouth daily. 11/09/19   [provider]  Lasmiditan Succinate (REYVOW) 100 MG TABS Take 100 mg by mouth daily as needed. 01/27/21   Tomi Likens, Adam R, DO  naproxen (NAPROSYN) 500 MG tablet Take 1 tablet (500 mg total) by  mouth every 12 (twelve) hours as needed. 11/18/17   Pieter Partridge, DO  omeprazole (PRILOSEC) 40 MG capsule Take 40 mg by mouth daily. 11/09/19   [provider]  ondansetron (ZOFRAN) 4 MG tablet Take 1 tablet (4 mg total) by mouth every 8 (eight) hours as needed for nausea or vomiting. 02/23/19   Pieter Partridge,  DO  prochlorperazine (COMPAZINE) 10 MG tablet Take 10 mg by mouth every 6 (six) hours as needed for nausea or vomiting.    [provider]  promethazine (PHENERGAN) 50 MG suppository Place 50 mg rectally every 8 (eight) hours as needed for nausea or vomiting.  Patient not taking: No sig reported    [provider]  propranolol ER (INDERAL LA) 120 MG 24 hr capsule Take 120 mg by mouth daily.    [provider]  Rimegepant Sulfate (NURTEC) 75 MG TBDP Take 1 tablet by mouth daily as needed. Patient not taking: No sig reported 03/04/19   Pieter Partridge, DO  Rimegepant Sulfate (NURTEC) 75 MG TBDP Take 75 mg by mouth as needed. Patient not taking: No sig reported 07/08/19   Pieter Partridge, DO  sertraline (ZOLOFT) 100 MG tablet Take 100 mg by mouth daily. 07/25/11   [provider]  simvastatin (ZOCOR) 40 MG tablet Take 40 mg by mouth daily. 08/15/19   [provider]  tiZANidine (ZANAFLEX) 4 MG tablet Take 4 mg by mouth every 12 (twelve) hours as needed for muscle spasms.     [provider]  zonisamide (ZONEGRAN) 100 MG capsule Take 1 capsule (100 mg total) by mouth daily. 03/08/21   Pieter Partridge, DO    Allergies    Penicillins, Lipitor [atorvastatin calcium], and Zithromax [azithromycin]  Review of Systems   Review of Systems  Constitutional:  Negative for chills and fever.  HENT:  Negative for trouble swallowing.   Respiratory:  Negative for shortness of breath.   Cardiovascular:  Negative for chest pain.  Musculoskeletal:  Positive for neck pain. Negative for neck stiffness.  Neurological:  Negative for speech difficulty and  numbness.  All other systems reviewed and are negative.  Physical Exam Updated Vital Signs BP 116/69 (BP Location: Right Arm)    Pulse 81    Temp 98.6 F (37 C) (Oral)    Resp 18    LMP 01/13/2016 (Exact Date)    SpO2 96%   Physical Exam Vitals and nursing note reviewed.  Constitutional:      General: She is not in acute distress.    Appearance: She is not ill-appearing.  HENT:     Head: Normocephalic.     Mouth/Throat:     Comments: Airway patent. Eyes:     Pupils: Pupils are equal, round, and reactive to light.  Neck:     Comments: Mild tenderness over anterior aspect of neck. Thyroid appears grossly normal. No meningismus. No cervical midline tenderness Cardiovascular:     Rate and Rhythm: Normal rate and regular rhythm.     Pulses: Normal pulses.     Heart sounds: Normal heart sounds. No murmur heard.   No friction rub. No gallop.  Pulmonary:     Effort: Pulmonary effort is normal.     Breath sounds: Normal breath sounds.  Abdominal:     General: Abdomen is flat. There is no distension.     Palpations: Abdomen is soft.     Tenderness: There is no abdominal tenderness. There is no guarding or rebound.  Musculoskeletal:        General: Normal range of motion.     Cervical back: Neck supple.  Skin:    General: Skin is warm and dry.  Neurological:     General: No focal deficit present.     Mental Status: She is alert.     Comments: Equal grip strength  Psychiatric:  Mood and Affect: Mood normal.        Behavior: Behavior normal.    ED Results / Procedures / Treatments   Labs (all labs ordered are listed, but only abnormal results are displayed) Labs Reviewed  BASIC METABOLIC PANEL - Abnormal; Notable for the following components:      Result Value   Glucose, Bld 131 (*)    All other components within normal limits  CBC WITH DIFFERENTIAL/PLATELET  TSH    EKG None  Radiology DG Cervical Spine Complete  Result Date: 04/04/2021 CLINICAL DATA:  Neck  pain after sleeping. EXAM: CERVICAL SPINE - COMPLETE 4+ VIEW COMPARISON:  Cervical spine CT 12/20/2015. FINDINGS: There is no evidence of cervical spine fracture or prevertebral soft tissue swelling. Alignment is normal. There is mild disc space narrowing and endplate osteophyte formation at C5-C6 and C6-C7 similar to the prior study compatible with degenerative change. There is mild bilateral neural foraminal stenosis at these levels secondary to uncovertebral spurring. IMPRESSION: 1. No acute fracture or malalignment. 2. Stable mild degenerative changes. Electronically Signed   By: Ronney Asters M.D.   On: 04/04/2021 23:06    Procedures Procedures   Medications Ordered in ED Medications  acetaminophen (TYLENOL) tablet 650 mg (has no administration in time range)    ED Course  I have reviewed the triage vital signs and the nursing notes.  Pertinent labs & imaging results that were available during my care of the patient were reviewed by me and considered in my medical decision making (see chart for details).    MDM Rules/Calculators/A&P                         50 year old female presents to the ED due to anterior neck pain associated with subjective swelling.  Patient concerned about thyroid.  Previous C3 fracture in 2005.  No difficulty swallowing or breathing.  No chest pain or shortness of breath.  Denies dizziness, numbness/tingling of upper extremities.  On arrival, stable vitals.  Patient no acute distress.  Physical exam significant for reproducible anterior neck tenderness.  Thyroid appears grossly normal.  Equal grip strength.  Normal neurological exam.  CBC unremarkable.  Normal hemoglobin.  No leukocytosis.  BMP significant for mild hyperglycemia at 131 without evidence of DKA.  Normal renal function.  Normal TSH.  Cervical x-ray personally reviewed which demonstrates degenerative changes. No acute findings. Patient declined EKG; however, low suspicion for cardiac etiology of neck pain.  Low suspicion for dissection given reproducible nature on exam.  Suspect MSK etiology.  Patient given Tylenol here in the ED and discharged with Robaxin. Advised patient to follow-up with PCP within the next week for further evaluation. Strict ED precautions discussed with patient. Patient states understanding and agrees to plan. Patient discharged home in no acute distress and stable vitals  Final Clinical Impression(s) / ED Diagnoses Final diagnoses:  Neck pain    Rx / DC Orders ED Discharge Orders          Ordered    methocarbamol (ROBAXIN) 500 MG tablet  2 times daily        04/05/21 0817    acetaminophen (TYLENOL 8 HOUR) 650 MG CR tablet  Every 8 hours PRN        04/05/21 0817             Suzy Bouchard, PA-C 04/05/21 4742    Sherwood Gambler, MD 04/05/21 1039

## 2021-04-05 NOTE — ED Notes (Signed)
Pt verbalizes understanding of discharge instructions. Opportunity for questions and answers were provided. Pt discharged from the ED.   ?

## 2021-04-05 NOTE — Discharge Instructions (Signed)
It was a pleasure taking care of you today. As discussed, all of your labs were reassuring. Your thyroid lab was normal. Your x-ray did not show any broken bones. I am sending you home with Tylenol and a muscle relaxer. Take as needed for pain. Follow-up with PCP within 1 week for further evaluation. Return to the ER for new or worsening symptoms.

## 2021-04-06 ENCOUNTER — Ambulatory Visit: Payer: Medicaid Other

## 2021-04-13 ENCOUNTER — Ambulatory Visit
Admission: RE | Admit: 2021-04-13 | Discharge: 2021-04-13 | Disposition: A | Discharge status: Home / Self Care | Payer: Medicaid Other | Source: Ambulatory Visit | Attending: Internal Medicine | Admitting: Internal Medicine

## 2021-04-13 DIAGNOSIS — Z1231 Encounter for screening mammogram for malignant neoplasm of breast: Secondary | ICD-10-CM

## 2021-05-05 ENCOUNTER — Telehealth: Payer: Self-pay

## 2021-05-05 NOTE — Telephone Encounter (Signed)
Received a call from [place] at [facility]. She stated they called Christine Vega to notify her of the arrival time for surgery on 05/09/2021. Christine Vega stated she didn't want to have the surgery and she will call when she is ready. This is the 4th time she has cancelled surgery.

## 2021-05-15 ENCOUNTER — Encounter: Payer: Medicaid Other | Admitting: Podiatry

## 2021-06-01 ENCOUNTER — Ambulatory Visit: Payer: Medicaid Other | Admitting: Podiatry

## 2021-06-05 ENCOUNTER — Encounter: Payer: Self-pay | Admitting: Podiatry

## 2021-06-05 ENCOUNTER — Ambulatory Visit: Payer: Medicaid Other | Admitting: Podiatry

## 2021-06-08 ENCOUNTER — Encounter: Payer: Self-pay | Admitting: Advanced Practice Midwife

## 2021-06-08 ENCOUNTER — Other Ambulatory Visit: Payer: Self-pay

## 2021-06-08 ENCOUNTER — Ambulatory Visit (INDEPENDENT_AMBULATORY_CARE_PROVIDER_SITE_OTHER): Payer: Medicaid Other | Admitting: Advanced Practice Midwife

## 2021-06-08 ENCOUNTER — Other Ambulatory Visit (HOSPITAL_COMMUNITY)
Admission: RE | Admit: 2021-06-08 | Discharge: 2021-06-08 | Disposition: A | Discharge status: Home / Self Care | Payer: Medicaid Other | Source: Ambulatory Visit | Attending: Advanced Practice Midwife | Admitting: Advanced Practice Midwife

## 2021-06-08 VITALS — BP 116/78 | HR 111 | Wt 217.0 lb

## 2021-06-08 DIAGNOSIS — Z01419 Encounter for gynecological examination (general) (routine) without abnormal findings: Secondary | ICD-10-CM | POA: Diagnosis present

## 2021-06-08 DIAGNOSIS — F4321 Adjustment disorder with depressed mood: Secondary | ICD-10-CM

## 2021-06-09 LAB — LIPID PANEL
Chol/HDL Ratio: 5.9 ratio — ABNORMAL HIGH (ref 0.0–4.4)
Cholesterol, Total: 252 mg/dL — ABNORMAL HIGH (ref 100–199)
HDL: 43 mg/dL (ref >39)
LDL Chol Calc (NIH): 162 mg/dL — ABNORMAL HIGH (ref 0–99)
Triglycerides: 254 mg/dL — ABNORMAL HIGH (ref 0–149)
VLDL Cholesterol Cal: 47 mg/dL — ABNORMAL HIGH (ref 5–40)

## 2021-06-09 LAB — HEMOGLOBIN A1C
Est. average glucose Bld gHb Est-mCnc: 146 mg/dL
Hgb A1c MFr Bld: 6.7 % — ABNORMAL HIGH (ref 4.8–5.6)

## 2021-06-09 LAB — HEPATITIS C ANTIBODY: Hep C Virus Ab: NONREACTIVE

## 2021-06-09 LAB — HIV ANTIBODY (ROUTINE TESTING W REFLEX): HIV Screen 4th Generation wRfx: NONREACTIVE

## 2021-06-09 LAB — RPR: RPR Ser Ql: NONREACTIVE

## 2021-06-09 LAB — VITAMIN D 25 HYDROXY (VIT D DEFICIENCY, FRACTURES): Vit D, 25-Hydroxy: 27.9 ng/mL — ABNORMAL LOW (ref 30.0–100.0)

## 2021-06-09 LAB — HEPATITIS B SURFACE ANTIGEN: Hepatitis B Surface Ag: NEGATIVE

## 2021-06-09 NOTE — Progress Notes (Signed)
GYNECOLOGY ANNUAL PREVENTATIVE CARE ENCOUNTER NOTE  History:     Christine Vega is a 51 y.o. G9P2002 female here for a routine annual gynecologic exam.  Current complaints: "feeling overwhelmed".  Patient's daughter and one year old grandson live with her. She provides a significant level of care for them both to allow her daughter to attend college. 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 Patient states she "has nothing for myself" and is not engaging in any personal stress-relieving activities. Patient also shares that she has been abstinent for more than one year. Denies abnormal vaginal bleeding, discharge, pelvic pain, problems with intercourse or other gynecologic concerns.    Gynecologic History Patient's last menstrual period was 01/13/2016 (exact date). Contraception: status post hysterectomy Last Pap: 12/04/2017. Result was normal with negative HPV Last Mammogram: 03/2021.  Result was normal   Obstetric History OB History  Gravida Para Term Preterm AB Living  2 2 2  0 0 2  SAB IAB Ectopic Multiple Live Births  0 0 0 0 2    # Outcome Date GA Lbr Len/2nd Weight Sex Delivery Anes PTL Lv  2 Term      Vag-Spont   LIV  1 Term      Vag-Spont   LIV    Past Medical History:  Diagnosis Date   Anemia    history    Anxiety    Back pain    rotates to left hip and tailbone   Depression    DUB (dysfunctional uterine bleeding) 12/01/2010   Endometriosis    Fractured coccyx (HCC)    Heart murmur    no problems   History of seasonal allergies    Hyperlipidemia    Migraine    Migraines    tx w/depakote/verapamil per pt   Murmur, heart    Neuromuscular disorder ([place] [employer] [place])    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, walks/balance ok   Post traumatic stress disorder (PTSD)    Seizures ([place]) 2004   x 1; unknown source   Uterine leiomyoma     Past Surgical History:  Procedure Laterality Date   CHOLECYSTECTOMY     HERNIA REPAIR     HYSTERECTOMY ABDOMINAL WITH  SALPINGECTOMY Left 02/07/2016   Procedure: HYSTERECTOMY ABDOMINAL WITH  left SALPINGECTOMY;  Surgeon: Mora Bellman, MD;  Location: [facility];  Service: Gynecology;  Laterality: Left;   LAPAROSCOPY  05/16/2011   Procedure: LAPAROSCOPY OPERATIVE;  Surgeon: Emeterio Reeve, MD;  Location: [facility];  Service: Gynecology;  Laterality: N/A;  poss oophorectomy   LAPAROSCOPY  08/06/2011   Procedure: LAPAROSCOPY OPERATIVE;  Surgeon: Woodroe Mode, MD;  Location: [facility];  Service: Gynecology;  Laterality: N/A;   LAPAROTOMY     for endometriosis/adhesions   SALPINGOOPHORECTOMY  08/06/2011   Procedure: SALPINGO OOPHERECTOMY;  Surgeon: Woodroe Mode, MD;  Location: [facility];  Service: Gynecology;  Laterality: Right;   SVD     x 2   TUBAL LIGATION     WISDOM TOOTH EXTRACTION      Current Outpatient Medications on File Prior to Visit  Medication Sig Dispense Refill   buPROPion (WELLBUTRIN XL) 150 MG 24 hr tablet Take 150 mg by mouth daily.     butalbital-acetaminophen-caffeine (FIORICET, ESGIC) 50-325-40 MG tablet Take 1 tablet by mouth every 8 (eight) hours as needed for migraine.     cyclobenzaprine (FLEXERIL) 5 MG tablet Take 1 tablet (5 mg total) by mouth 3 (three) times daily as needed for muscle  spasms. 10 tablet 0   eletriptan (RELPAX) 40 MG tablet TAKE 1 TABLET BY MOUTH AS NEEDED FOR MIGRAINE OR HEADACHE, MAY REPEAT IN 2 HOURS IF HEADACHE PERSISTS OR RECURS 10 tablet 0   fluticasone (FLONASE) 50 MCG/ACT nasal spray SHAKE LIQUID AND USE 2 SPRAYS IN EACH NOSTRIL DAILY     gabapentin (NEURONTIN) 300 MG capsule Take 300 mg by mouth 3 (three) times daily.     Galcanezumab-gnlm (EMGALITY) 120 MG/ML SOAJ Inject 120 mg into the skin every 28 (twenty-eight) days. 1.12 mL 5   hydrOXYzine (ATARAX/VISTARIL) 50 MG tablet Take 50 mg by mouth 3 (three) times daily as needed.     lamoTRIgine (LAMICTAL) 100 MG tablet Take 100 mg by mouth daily.     Lasmiditan Succinate (REYVOW) 100 MG TABS Take 100 mg by mouth daily as  needed. 8 tablet 5   omeprazole (PRILOSEC) 40 MG capsule Take 40 mg by mouth daily.     ondansetron (ZOFRAN) 4 MG tablet Take 1 tablet (4 mg total) by mouth every 8 (eight) hours as needed for nausea or vomiting. 20 tablet 3   prochlorperazine (COMPAZINE) 10 MG tablet Take 10 mg by mouth every 6 (six) hours as needed for nausea or vomiting.     promethazine (PHENERGAN) 50 MG suppository Place 50 mg rectally every 8 (eight) hours as needed for nausea or vomiting.     sertraline (ZOLOFT) 100 MG tablet Take 100 mg by mouth daily.     simvastatin (ZOCOR) 40 MG tablet Take 40 mg by mouth daily.     tiZANidine (ZANAFLEX) 4 MG tablet Take 4 mg by mouth every 12 (twelve) hours as needed for muscle spasms.      zonisamide (ZONEGRAN) 100 MG capsule Take 1 capsule (100 mg total) by mouth daily. 30 capsule 5   acetaminophen (TYLENOL 8 HOUR) 650 MG CR tablet Take 1 tablet (650 mg total) by mouth every 8 (eight) hours as needed for pain. (Patient not taking: Reported on 06/08/2021) 15 tablet 0   AMITIZA 24 MCG capsule Take 24 mcg by mouth 2 (two) times daily.     Atogepant (QULIPTA) 60 MG TABS Take 60 mg by mouth daily. (Patient not taking: Reported on 01/27/2021) 30 tablet 5   BOTOX 200 units SOLR INJECT 200 UNITS AS DIRECTED ONCE FOR 1 DOSE. (Patient not taking: No sig reported)     etodolac (LODINE) 400 MG tablet Take 1 tablet (400 mg total) by mouth 2 (two) times daily. 30 tablet 0   methocarbamol (ROBAXIN) 500 MG tablet Take 1 tablet (500 mg total) by mouth 2 (two) times daily. (Patient not taking: Reported on 06/08/2021) 20 tablet 0   naproxen (NAPROSYN) 500 MG tablet Take 1 tablet (500 mg total) by mouth every 12 (twelve) hours as needed. (Patient not taking: Reported on 06/08/2021) 16 tablet 2   propranolol ER (INDERAL LA) 120 MG 24 hr capsule Take 120 mg by mouth daily.     Rimegepant Sulfate (NURTEC) 75 MG TBDP Take 1 tablet by mouth daily as needed. (Patient not taking: Reported on 02/11/2020) 3 tablet 0    Rimegepant Sulfate (NURTEC) 75 MG TBDP Take 75 mg by mouth as needed. (Patient not taking: Reported on 08/12/2020) 4 tablet 0   No current facility-administered medications on file prior to visit.    Allergies  Allergen Reactions   Penicillins Hives, Itching and Other (See Comments)    Has patient had a PCN reaction causing immediate rash, facial/tongue/throat swelling, SOB or  lightheadedness with hypotension: No Has patient had a PCN reaction causing severe rash involving mucus membranes or skin necrosis: No Has patient had a PCN reaction that required hospitalization: No Has patient had a PCN reaction occurring within the last 10 years: Yes If all of the above answers are "NO", then may proceed with Cephalosporin use.    Lipitor [Atorvastatin Calcium]    Zithromax [Azithromycin] Hives and Itching    Social History:  reports that she has never smoked. She has never used smokeless tobacco. She reports that she does not drink alcohol and does not use drugs.  Family History  Problem Relation Age of Onset   Migraines Mother    Heart disease Father    Migraines Father    Migraines Daughter    Breast cancer Neg Hx     The following portions of the patient's history were reviewed and updated as appropriate: allergies, current medications, past family history, past medical history, past social history, past surgical history and problem list.  Review of Systems Pertinent items noted in HPI and remainder of comprehensive ROS otherwise negative.  Physical Exam:  BP 116/78    Pulse (!) 111    Wt 217 lb (98.4 kg)    LMP 01/13/2016 (Exact Date)    BMI 30.27 kg/m  CONSTITUTIONAL: Well-developed, well-nourished female in no acute distress.  HENT:  Normocephalic, atraumatic, External right and left ear normal.  EYES: Conjunctivae and EOM are normal. Pupils are equal, round, and reactive to light. No scleral icterus.  NECK: Normal range of motion, supple, no masses.  Normal thyroid.  SKIN:  Skin is warm and dry. No rash noted. Not diaphoretic. No erythema. No pallor. MUSCULOSKELETAL: Normal range of motion. No tenderness.  No cyanosis, clubbing, or edema. NEUROLOGIC: Alert and oriented to person, place, and time. Normal reflexes, muscle tone coordination.  PSYCHIATRIC: Normal mood and affect. Normal behavior. Normal judgment and thought content. CARDIOVASCULAR: Normal heart rate noted, regular rhythm RESPIRATORY: Clear to auscultation bilaterally. Effort and breath sounds normal, no problems with respiration noted. BREASTS: Symmetric in size. No masses, tenderness, skin changes, nipple drainage, or lymphadenopathy bilaterally. Performed in the presence of a chaperone. ABDOMEN: Soft, no distention noted.  No tenderness, rebound or guarding.  PELVIC: Normal appearing external genitalia and urethral meatus; normal appearing vaginal mucosa and cervix.  Moderate amount of thin bluish-white vaginal discharge noted.  Pap smear obtained.  Performed in the presence of a chaperone.   Assessment and Plan:    1. Well woman exam with routine gynecological exam - No acute findings - Cytology - PAP - Hemoglobin A1c - Lipid panel - VITAMIN D 25 Hydroxy (Vit-D Deficiency, Fractures) - Hepatitis B surface antigen - Hepatitis C antibody - HIV Antibody (routine testing w rflx) - RPR  2. Situational depression - Has referral to Psychiatry/counseling - Encouraged to make plan for scheduled self care  Will follow up results of pap smear and manage accordingly. Mammogram completed 04/14/2021 Routine preventative health maintenance measures emphasized. Please refer to After Visit Summary for other counseling recommendations.      Mallie Snooks, [place], MSN, CNM Certified Nurse Midwife, Product/process development scientist for [employer], [place]

## 2021-06-12 ENCOUNTER — Ambulatory Visit: Payer: Medicaid Other | Admitting: Podiatry

## 2021-06-13 LAB — CYTOLOGY - PAP
Comment: NEGATIVE
Diagnosis: NEGATIVE
High risk HPV: NEGATIVE

## 2021-07-12 ENCOUNTER — Other Ambulatory Visit: Payer: Self-pay | Admitting: Neurology

## 2021-07-13 NOTE — Telephone Encounter (Signed)
1. Which medications need refilled? (List name and dosage, if known) Emgality ? ?2. Which pharmacy/location is medication to be sent to? (include street and city if Management consultant) [facility] on [address] ? ?Last seen 08/12/20 ?

## 2021-07-14 ENCOUNTER — Other Ambulatory Visit: Payer: Self-pay | Admitting: Neurology

## 2021-07-17 ENCOUNTER — Telehealth: Payer: Self-pay | Admitting: Neurology

## 2021-07-17 NOTE — Telephone Encounter (Signed)
1. Which medications need refilled? (List name and dosage, if known) Emgality  ? ?2. Which pharmacy/location is medication to be sent to? (include street and city if Management consultant) [facility] on [address] ? ?Pt called in again stating [facility] didn't have it yet. She called in on Thursday. Her last visit was 01/27/21. ?

## 2021-07-18 ENCOUNTER — Other Ambulatory Visit: Payer: Self-pay

## 2021-07-18 MED ORDER — EMGALITY 120 MG/ML ~~LOC~~ SOAJ: 120.0000 mg | SUBCUTANEOUS | 0 refills | Status: DC

## 2021-07-18 NOTE — Telephone Encounter (Signed)
Sent to [facility] [address] [address] no refill until seen, Patient is scheduled in May ?

## 2021-07-19 ENCOUNTER — Encounter: Payer: Self-pay | Admitting: Oncology

## 2021-07-19 ENCOUNTER — Other Ambulatory Visit (HOSPITAL_COMMUNITY): Payer: Self-pay

## 2021-08-11 ENCOUNTER — Other Ambulatory Visit: Payer: Self-pay | Admitting: Neurology

## 2021-08-16 NOTE — Progress Notes (Deleted)
NEUROLOGY FOLLOW UP OFFICE NOTE  Christine Vega [id number]  Assessment/Plan:   Chronic migraine   Migraine prevention:  In addition to Emgality, start zonisamide titrating to '100mg'$  daily Migraine rescue:  Reyvow.  Zofran for nausea Limit use of pain relievers to no more than 2 days out of week to prevent risk of rebound or medication-overuse headache. Keep headache diary Follow up 6 months  Subjective:  Christine Vega is a 51 year old woman with migraines and chronic pain who follows up for migraine.   UPDATE: Started zonisamide in October  Intensity:  severe Duration:  2 to 2 and 1/2 days.   Frequency:  14 days a month Frequency of abortive medication: Fioricet 13 to 14 days a month. Current NSAIDS:  etodolac, naproxen Current analgesics:  none Current triptans/ergot:   none Current anti-emetic:  Zofran '4mg'$  Current muscle relaxants:  tizanidine '4mg'$  Current anti-anxiolytic:  no Current sleep aide:  no Current Antihypertensive medications:  Propranolol ER '120mg'$  Current Antidepressant medications:  bupropion, sertraline '100mg'$  Current Anticonvulsant medications:zonisamide '100mg'$  daily, gabapentin '300mg'$  three times daily, lamotrigine '100mg'$  daily Current CGRP inhibitor:  Emgality Current Vitamins/Herbal/Supplements:  D, E Current Antihistamines/Decongestants:  hydroxyzine, Flonase Other therapy:  none   Caffeine:  Rarely coffee, tea, soda Alcohol:  no Smoker:  no Diet:  Hydrates Exercise:  yes Depression/anxiety:  stable Sleep hygiene:  poor    HISTORY: Onset: 51 years old Location:  Either temple Quality:  Throbbing, pressure Initial Intensity:  10/10 Aura:  no Prodrome:  no Associated symptoms: Nausea, vomiting, photophobia, phonophobia, osmophobia, blurred vision Initial Duration:  3 days  Initial Frequency:  16 headache days a month Triggers: Certain smells, chocolate , garlic Relieving factors: None Activity:  Cannot function   Past NSAIDS:  Ibuprofen,  naproxen Past analgesics:  Tylenol, oxycodone, Excedrin, Fioricet Past abortive triptans/ergot:  Sumatriptan (tablet and injection), Maxalt, Zomig NS, Migranal NS, Relpax Past muscle relaxants:  Flexeril Past anti-emetic:  promethazine '50mg'$  PR, Compazine Past anti-anxiolytic:  lorazepam Past antihypertensive medications:  verapamil Past antidepressant medications:  nortriptyline Past anticonvulsant medications:  Topiramate, Lyrica Past CGRP inhibitor:  Aimovig '70mg'$  (makes her feel weird); Roselyn Meier (makes her feel queasy, ineffective), Nurtec, Qulipta Past vitamins/Herbal/Supplements:  No Other therapy:  Botox   Family history of headache:  Mother, daughters   CT of head and cervical spine from 12/20/15 were personally reviewed and were negative for acute changes.  Cervical spine did demonstrate degenerative disc disease at C5-6 and C6-7.  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 (Patient not taking: Reported on 06/08/2021) 15 tablet 0   AMITIZA 24 MCG capsule Take 24 mcg by mouth 2 (two) times daily.     Atogepant (QULIPTA) 60 MG TABS Take 60 mg by mouth daily. (Patient not taking: Reported on 01/27/2021) 30  tablet 5   BOTOX 200 units SOLR INJECT 200 UNITS AS DIRECTED ONCE FOR 1 DOSE. (Patient not taking: No sig reported)     buPROPion (WELLBUTRIN XL) 150 MG 24 hr tablet Take 150 mg by mouth daily.     butalbital-acetaminophen-caffeine (FIORICET, ESGIC) 50-325-40 MG tablet Take 1 tablet by mouth every 8 (eight) hours as needed for migraine.     cyclobenzaprine (FLEXERIL) 5 MG tablet Take 1 tablet (5 mg total) by mouth 3 (three) times daily as needed for muscle spasms. 10 tablet 0   eletriptan (RELPAX) 40 MG tablet TAKE 1 TABLET BY MOUTH AS NEEDED FOR MIGRAINE OR HEADACHE, MAY REPEAT IN 2 HOURS IF HEADACHE PERSISTS OR RECURS 10 tablet 0   EMGALITY 120 MG/ML SOAJ ADMINISTER 1 ML UNDER THE SKIN EVERY 28 DAYS 1 mL 0   etodolac (LODINE) 400 MG tablet Take 1 tablet (400 mg total) by mouth 2 (two) times daily. 30 tablet 0   fluticasone (FLONASE) 50 MCG/ACT nasal spray SHAKE LIQUID AND USE 2 SPRAYS IN EACH NOSTRIL DAILY     gabapentin (NEURONTIN) 300 MG capsule Take 300 mg by mouth 3 (three) times daily.     hydrOXYzine (ATARAX/VISTARIL) 50 MG tablet Take 50 mg by mouth 3 (three) times daily as needed.     lamoTRIgine (LAMICTAL) 100 MG tablet Take 100 mg by mouth daily.     methocarbamol (ROBAXIN) 500 MG tablet Take 1 tablet (500 mg total) by mouth 2 (two) times daily. (Patient not taking: Reported on 06/08/2021) 20 tablet 0   naproxen (NAPROSYN) 500 MG tablet Take 1 tablet (500 mg total) by mouth every 12 (twelve) hours as needed. (Patient not taking: Reported on 06/08/2021) 16 tablet 2   omeprazole (PRILOSEC) 40 MG capsule Take 40 mg by mouth daily.     ondansetron (ZOFRAN) 4 MG tablet Take 1 tablet (4 mg total) by mouth every 8 (eight) hours as needed for nausea or vomiting. 20 tablet 3   prochlorperazine (COMPAZINE) 10 MG tablet Take 10 mg by mouth every 6 (six) hours as needed for nausea or vomiting.     promethazine (PHENERGAN) 50 MG suppository Place 50 mg rectally every 8 (eight) hours as needed for  nausea or vomiting.     propranolol ER (INDERAL LA) 120 MG 24 hr capsule Take 120 mg by mouth daily.     REYVOW 100 MG TABS TAKE 1 TABLET BY MOUTH DAILY AS NEEDED 8 tablet 0   Rimegepant Sulfate (NURTEC) 75 MG TBDP Take 1 tablet by mouth daily as needed. (Patient not taking: Reported on 02/11/2020) 3 tablet 0   Rimegepant Sulfate (NURTEC) 75 MG TBDP Take 75 mg by mouth as needed. (Patient not taking: Reported on 08/12/2020) 4 tablet 0   sertraline (ZOLOFT) 100 MG tablet Take 100 mg by mouth daily.     simvastatin (ZOCOR) 40 MG tablet Take 40 mg by mouth daily.     tiZANidine (ZANAFLEX) 4 MG tablet Take 4 mg by mouth every 12 (twelve) hours as needed for muscle spasms.      zonisamide (ZONEGRAN) 100 MG capsule Take 1 capsule (100 mg total) by mouth daily. 30 capsule 5   No current facility-administered medications on file  prior to visit.    ALLERGIES: Allergies  Allergen Reactions   Penicillins Hives, Itching and Other (See Comments)    Has patient had a PCN reaction causing immediate rash, facial/tongue/throat swelling, SOB or lightheadedness with hypotension: No Has patient had a PCN reaction causing severe rash involving mucus membranes or skin necrosis: No Has patient had a PCN reaction that required hospitalization: No Has patient had a PCN reaction occurring within the last 10 years: Yes If all of the above answers are "NO", then may proceed with Cephalosporin use.    Lipitor [Atorvastatin Calcium]    Zithromax [Azithromycin] Hives and Itching    FAMILY HISTORY: Family History  Problem Relation Age of Onset   Migraines Mother    Heart disease Father    Migraines Father    Migraines Daughter    Breast cancer Neg Hx       Objective:  *** General: No acute distress.  Patient appears ***-groomed.   Head:  Normocephalic/atraumatic Eyes:  Fundi examined but not visualized Neck: supple, no paraspinal tenderness, full range of motion Heart:  Regular rate and rhythm Lungs:   Clear to auscultation bilaterally Back: No paraspinal tenderness Neurological Exam: alert and oriented to person, place, and time.  Speech fluent and not dysarthric, language intact.  CN II-XII intact. Bulk and tone normal, muscle strength 5/5 throughout.  Sensation to light touch intact.  Deep tendon reflexes 2+ throughout, toes downgoing.  Finger to nose testing intact.  Gait normal, Romberg negative.   Metta Clines, DO  CC: ***

## 2021-08-18 ENCOUNTER — Ambulatory Visit: Payer: Medicaid Other | Admitting: Neurology

## 2021-08-28 ENCOUNTER — Ambulatory Visit: Payer: Medicaid Other | Admitting: Neurology

## 2021-08-28 NOTE — Progress Notes (Deleted)
NEUROLOGY FOLLOW UP OFFICE NOTE  [name] [id number]  Assessment/Plan:   Chronic migraine   Migraine prevention:  In addition to Emgality, start zonisamide titrating to '100mg'$  daily Migraine rescue:  Reyvow.  Zofran for nausea Limit use of pain relievers to no more than 2 days out of week to prevent risk of rebound or medication-overuse headache. Keep headache diary Follow up 6 months  Subjective:  Christine Vega is a 51 year old woman with migraines and chronic pain who follows up for migraine.   UPDATE: Started zonisamide in October  Intensity:  severe Duration:  2 to 2 and 1/2 days.   Frequency:  14 days a month Frequency of abortive medication: Fioricet 13 to 14 days a month. Current NSAIDS:  etodolac, naproxen Current analgesics:  none Current triptans/ergot:   none Current anti-emetic:  Zofran '4mg'$  Current muscle relaxants:  tizanidine '4mg'$  Current anti-anxiolytic:  no Current sleep aide:  no Current Antihypertensive medications:  Propranolol ER '120mg'$  Current Antidepressant medications:  bupropion, sertraline '100mg'$  Current Anticonvulsant medications:zonisamide '100mg'$  daily, gabapentin '300mg'$  three times daily, lamotrigine '100mg'$  daily Current CGRP inhibitor:  Emgality Current Vitamins/Herbal/Supplements:  D, E Current Antihistamines/Decongestants:  hydroxyzine, Flonase Other therapy:  none   Caffeine:  Rarely coffee, tea, soda Alcohol:  no Smoker:  no Diet:  Hydrates Exercise:  yes Depression/anxiety:  stable Sleep hygiene:  poor    HISTORY: Onset: 51 years old Location:  Either temple Quality:  Throbbing, pressure Initial Intensity:  10/10 Aura:  no Prodrome:  no Associated symptoms: Nausea, vomiting, photophobia, phonophobia, osmophobia, blurred vision Initial Duration:  3 days  Initial Frequency:  16 headache days a month Triggers: Certain smells, chocolate , garlic Relieving factors: None Activity:  Cannot function   Past NSAIDS:  Ibuprofen,  naproxen Past analgesics:  Tylenol, oxycodone, Excedrin, Fioricet Past abortive triptans/ergot:  Sumatriptan (tablet and injection), Maxalt, Zomig NS, Migranal NS, Relpax Past muscle relaxants:  Flexeril Past anti-emetic:  promethazine '50mg'$  PR, Compazine Past anti-anxiolytic:  lorazepam Past antihypertensive medications:  verapamil Past antidepressant medications:  nortriptyline Past anticonvulsant medications:  Topiramate, Lyrica Past CGRP inhibitor:  Aimovig '70mg'$  (makes her feel weird); Roselyn Meier (makes her feel queasy, ineffective), Nurtec, Qulipta Past vitamins/Herbal/Supplements:  No Other therapy:  Botox   Family history of headache:  Mother, daughters   CT of head and cervical spine from 12/20/15 were personally reviewed and were negative for acute changes.  Cervical spine did demonstrate degenerative disc disease at C5-6 and C6-7.  PAST MEDICAL HISTORY: Past Medical History:  Diagnosis Date   Anemia    history    Anxiety    Back pain    rotates to left hip and tailbone   Depression    DUB (dysfunctional uterine bleeding) 12/01/2010   Endometriosis    Fractured coccyx ([facility])    Heart murmur    no problems   History of seasonal allergies    Hyperlipidemia    Migraine    Migraines    tx w/depakote/verapamil per pt   Murmur, heart    Neuromuscular disorder ([place])    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, walks/balance ok   Post traumatic stress disorder (PTSD)    Seizures ([place]) 2004   x 1; unknown source   Uterine leiomyoma     MEDICATIONS: Current Outpatient Medications on File Prior to Visit  Medication Sig Dispense Refill   acetaminophen (TYLENOL 8 HOUR) 650 MG CR tablet Take 1 tablet (650 mg total) by mouth every 8 (eight) hours as needed for  pain. (Patient not taking: Reported on 06/08/2021) 15 tablet 0   AMITIZA 24 MCG capsule Take 24 mcg by mouth 2 (two) times daily.     Atogepant (QULIPTA) 60 MG TABS Take 60 mg by mouth daily. (Patient not taking: Reported on 01/27/2021) 30  tablet 5   BOTOX 200 units SOLR INJECT 200 UNITS AS DIRECTED ONCE FOR 1 DOSE. (Patient not taking: No sig reported)     buPROPion (WELLBUTRIN XL) 150 MG 24 hr tablet Take 150 mg by mouth daily.     butalbital-acetaminophen-caffeine (FIORICET, ESGIC) 50-325-40 MG tablet Take 1 tablet by mouth every 8 (eight) hours as needed for migraine.     cyclobenzaprine (FLEXERIL) 5 MG tablet Take 1 tablet (5 mg total) by mouth 3 (three) times daily as needed for muscle spasms. 10 tablet 0   eletriptan (RELPAX) 40 MG tablet TAKE 1 TABLET BY MOUTH AS NEEDED FOR MIGRAINE OR HEADACHE, MAY REPEAT IN 2 HOURS IF HEADACHE PERSISTS OR RECURS 10 tablet 0   EMGALITY 120 MG/ML SOAJ ADMINISTER 1 ML UNDER THE SKIN EVERY 28 DAYS 1 mL 0   etodolac (LODINE) 400 MG tablet Take 1 tablet (400 mg total) by mouth 2 (two) times daily. 30 tablet 0   fluticasone (FLONASE) 50 MCG/ACT nasal spray SHAKE LIQUID AND USE 2 SPRAYS IN EACH NOSTRIL DAILY     gabapentin (NEURONTIN) 300 MG capsule Take 300 mg by mouth 3 (three) times daily.     hydrOXYzine (ATARAX/VISTARIL) 50 MG tablet Take 50 mg by mouth 3 (three) times daily as needed.     lamoTRIgine (LAMICTAL) 100 MG tablet Take 100 mg by mouth daily.     methocarbamol (ROBAXIN) 500 MG tablet Take 1 tablet (500 mg total) by mouth 2 (two) times daily. (Patient not taking: Reported on 06/08/2021) 20 tablet 0   naproxen (NAPROSYN) 500 MG tablet Take 1 tablet (500 mg total) by mouth every 12 (twelve) hours as needed. (Patient not taking: Reported on 06/08/2021) 16 tablet 2   omeprazole (PRILOSEC) 40 MG capsule Take 40 mg by mouth daily.     ondansetron (ZOFRAN) 4 MG tablet Take 1 tablet (4 mg total) by mouth every 8 (eight) hours as needed for nausea or vomiting. 20 tablet 3   prochlorperazine (COMPAZINE) 10 MG tablet Take 10 mg by mouth every 6 (six) hours as needed for nausea or vomiting.     promethazine (PHENERGAN) 50 MG suppository Place 50 mg rectally every 8 (eight) hours as needed for  nausea or vomiting.     propranolol ER (INDERAL LA) 120 MG 24 hr capsule Take 120 mg by mouth daily.     REYVOW 100 MG TABS TAKE 1 TABLET BY MOUTH DAILY AS NEEDED 8 tablet 0   Rimegepant Sulfate (NURTEC) 75 MG TBDP Take 1 tablet by mouth daily as needed. (Patient not taking: Reported on 02/11/2020) 3 tablet 0   Rimegepant Sulfate (NURTEC) 75 MG TBDP Take 75 mg by mouth as needed. (Patient not taking: Reported on 08/12/2020) 4 tablet 0   sertraline (ZOLOFT) 100 MG tablet Take 100 mg by mouth daily.     simvastatin (ZOCOR) 40 MG tablet Take 40 mg by mouth daily.     tiZANidine (ZANAFLEX) 4 MG tablet Take 4 mg by mouth every 12 (twelve) hours as needed for muscle spasms.      zonisamide (ZONEGRAN) 100 MG capsule Take 1 capsule (100 mg total) by mouth daily. 30 capsule 5   No current facility-administered medications on file  prior to visit.    ALLERGIES: Allergies  Allergen Reactions   Penicillins Hives, Itching and Other (See Comments)    Has patient had a PCN reaction causing immediate rash, facial/tongue/throat swelling, SOB or lightheadedness with hypotension: No Has patient had a PCN reaction causing severe rash involving mucus membranes or skin necrosis: No Has patient had a PCN reaction that required hospitalization: No Has patient had a PCN reaction occurring within the last 10 years: Yes If all of the above answers are "NO", then may proceed with Cephalosporin use.    Lipitor [Atorvastatin Calcium]    Zithromax [Azithromycin] Hives and Itching    FAMILY HISTORY: Family History  Problem Relation Age of Onset   Migraines Mother    Heart disease Father    Migraines Father    Migraines Daughter    Breast cancer Neg Hx       Objective:  *** General: No acute distress.  Patient appears ***-groomed.   Head:  Normocephalic/atraumatic Eyes:  Fundi examined but not visualized Neck: supple, no paraspinal tenderness, full range of motion Heart:  Regular rate and rhythm Lungs:   Clear to auscultation bilaterally Back: No paraspinal tenderness Neurological Exam: alert and oriented to person, place, and time.  Speech fluent and not dysarthric, language intact.  CN II-XII intact. Bulk and tone normal, muscle strength 5/5 throughout.  Sensation to light touch intact.  Deep tendon reflexes 2+ throughout, toes downgoing.  Finger to nose testing intact.  Gait normal, Romberg negative.   Metta Clines, DO  CC: ***

## 2021-08-29 ENCOUNTER — Encounter: Payer: Self-pay | Admitting: Neurology

## 2021-08-29 ENCOUNTER — Ambulatory Visit: Payer: Medicaid Other | Admitting: Neurology

## 2021-08-29 DIAGNOSIS — Z029 Encounter for administrative examinations, unspecified: Secondary | ICD-10-CM

## 2021-08-30 ENCOUNTER — Encounter: Payer: Self-pay | Admitting: Neurology

## 2021-08-31 ENCOUNTER — Telehealth: Payer: Self-pay | Admitting: Neurology

## 2021-08-31 NOTE — Telephone Encounter (Signed)
Patient dismissed from [facility] and all providers practicing at this clinic. 08/31/21

## 2021-09-07 ENCOUNTER — Other Ambulatory Visit: Payer: Self-pay | Admitting: Neurology

## 2021-09-08 ENCOUNTER — Other Ambulatory Visit: Payer: Self-pay | Admitting: Neurology

## 2021-09-14 ENCOUNTER — Ambulatory Visit (INDEPENDENT_AMBULATORY_CARE_PROVIDER_SITE_OTHER): Payer: Medicaid Other

## 2021-09-14 ENCOUNTER — Encounter (HOSPITAL_COMMUNITY): Payer: Self-pay

## 2021-09-14 ENCOUNTER — Ambulatory Visit (HOSPITAL_COMMUNITY)
Admission: EM | Admit: 2021-09-14 | Discharge: 2021-09-14 | Disposition: A | Discharge status: Home / Self Care | Payer: Medicaid Other | Attending: Emergency Medicine | Admitting: Emergency Medicine

## 2021-09-14 DIAGNOSIS — M542 Cervicalgia: Secondary | ICD-10-CM | POA: Diagnosis not present

## 2021-09-14 MED ORDER — PREDNISONE 20 MG PO TABS: 40.0000 mg | ORAL_TABLET | Freq: Every day | ORAL | 0 refills | Status: DC

## 2021-09-14 MED ORDER — CYCLOBENZAPRINE HCL 10 MG PO TABS: 10.0000 mg | ORAL_TABLET | Freq: Every day | ORAL | 0 refills | Status: DC

## 2021-09-14 NOTE — ED Provider Notes (Signed)
[place]    CSN: [id number] Arrival date & time: 09/14/21  1805      History   Chief Complaint Chief Complaint  Patient presents with   Neck Pain    HPI Christine Vega is a 51 y.o. female.   Patient presents with right-sided neck pain and swelling beginning upon awakening this morning.  Feels as if pain is a result of sleeping awkwardly.  Range of motion intact but is pain is elicited when turning the head to the left side.  Has not attempted treatment of symptoms.  Denies numbness, tingling.  History of a cervical fracture.    Past Medical History:  Diagnosis Date   Anemia    history    Anxiety    Back pain    rotates to left hip and tailbone   Depression    DUB (dysfunctional uterine bleeding) 12/01/2010   Endometriosis    Fractured coccyx (HCC)    Heart murmur    no problems   History of seasonal allergies    Hyperlipidemia    Migraine    Migraines    tx w/depakote/verapamil per pt   Murmur, heart    Neuromuscular disorder ([place])    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, walks/balance ok   Post traumatic stress disorder (PTSD)    Seizures ([place]) 2004   x 1; unknown source   Uterine leiomyoma     Patient Active Problem List   Diagnosis Date Noted   Nasal turbinate hypertrophy 05/14/2017   Iron deficiency anemia due to chronic blood loss 04/05/2016   Cervical high risk HPV (human papillomavirus) test positive, normal cytology on 02/24/14 03/01/2014   Atypical migraine 06/24/2013   Migraine without aura 06/24/2013   Vaginal pain 12/01/2010    Past Surgical History:  Procedure Laterality Date   CHOLECYSTECTOMY     HERNIA REPAIR     HYSTERECTOMY ABDOMINAL WITH SALPINGECTOMY Left 02/07/2016   Procedure: HYSTERECTOMY ABDOMINAL WITH  left SALPINGECTOMY;  Surgeon: Mora Bellman, MD;  Location: [facility];  Service: Gynecology;  Laterality: Left;   LAPAROSCOPY  05/16/2011   Procedure: LAPAROSCOPY OPERATIVE;  Surgeon: Emeterio Reeve, MD;  Location: [facility];  Service:  Gynecology;  Laterality: N/A;  poss oophorectomy   LAPAROSCOPY  08/06/2011   Procedure: LAPAROSCOPY OPERATIVE;  Surgeon: Woodroe Mode, MD;  Location: [facility];  Service: Gynecology;  Laterality: N/A;   LAPAROTOMY     for endometriosis/adhesions   SALPINGOOPHORECTOMY  08/06/2011   Procedure: SALPINGO OOPHERECTOMY;  Surgeon: Woodroe Mode, MD;  Location: [facility];  Service: Gynecology;  Laterality: Right;   SVD     x 2   TUBAL LIGATION     WISDOM TOOTH EXTRACTION      OB History     Gravida  2   Para  2   Term  2   Preterm  0   AB  0   Living  2      SAB  0   IAB  0   Ectopic  0   Multiple  0   Live Births  2            Home Medications    Prior to Admission medications   Medication Sig Start Date End Date Taking? Authorizing Provider  acetaminophen (TYLENOL 8 HOUR) 650 MG CR tablet Take 1 tablet (650 mg total) by mouth every 8 (eight) hours as needed for pain. Patient not taking: Reported on 06/08/2021 04/05/21   Suzy Bouchard,  PA-C  AMITIZA 24 MCG capsule Take 24 mcg by mouth 2 (two) times daily. 11/09/19   [provider]  Atogepant (QULIPTA) 60 MG TABS Take 60 mg by mouth daily. Patient not taking: Reported on 01/27/2021 03/21/20   Metta Clines R, DO  BOTOX 200 units SOLR INJECT 200 UNITS AS DIRECTED ONCE FOR 1 DOSE. Patient not taking: No sig reported 08/28/19   [provider]  buPROPion (WELLBUTRIN XL) 150 MG 24 hr tablet Take 150 mg by mouth daily.    [provider]  butalbital-acetaminophen-caffeine (FIORICET, ESGIC) 50-325-40 MG tablet Take 1 tablet by mouth every 8 (eight) hours as needed for migraine.    [provider]  cyclobenzaprine (FLEXERIL) 5 MG tablet Take 1 tablet (5 mg total) by mouth 3 (three) times daily as needed for muscle spasms. 05/07/18   Drenda Freeze, MD  eletriptan (RELPAX) 40 MG tablet TAKE 1 TABLET BY MOUTH AS NEEDED FOR MIGRAINE OR HEADACHE, MAY REPEAT IN 2 HOURS IF HEADACHE PERSISTS OR  RECURS 08/18/19   Tomi Likens, Adam R, DO  EMGALITY 120 MG/ML SOAJ ADMINISTER 1 ML UNDER THE SKIN EVERY 28 DAYS 08/11/21   Pieter Partridge, DO  etodolac (LODINE) 400 MG tablet Take 1 tablet (400 mg total) by mouth 2 (two) times daily. 12/14/20   Hughie Closs, PA-C  fluticasone (FLONASE) 50 MCG/ACT nasal spray SHAKE LIQUID AND USE 2 SPRAYS IN [facility] NOSTRIL DAILY 10/14/18   [provider]  gabapentin (NEURONTIN) 300 MG capsule Take 300 mg by mouth 3 (three) times daily.    [provider]  hydrOXYzine (ATARAX/VISTARIL) 50 MG tablet Take 50 mg by mouth 3 (three) times daily as needed. 11/09/19   [provider]  lamoTRIgine (LAMICTAL) 100 MG tablet Take 100 mg by mouth daily. 11/09/19   [provider]  methocarbamol (ROBAXIN) 500 MG tablet Take 1 tablet (500 mg total) by mouth 2 (two) times daily. Patient not taking: Reported on 06/08/2021 04/05/21   Suzy Bouchard, PA-C  naproxen (NAPROSYN) 500 MG tablet Take 1 tablet (500 mg total) by mouth every 12 (twelve) hours as needed. Patient not taking: Reported on 06/08/2021 11/18/17   Pieter Partridge, DO  omeprazole (PRILOSEC) 40 MG capsule Take 40 mg by mouth daily. 11/09/19   [provider]  ondansetron (ZOFRAN) 4 MG tablet Take 1 tablet (4 mg total) by mouth every 8 (eight) hours as needed for nausea or vomiting. 02/23/19   Pieter Partridge, DO  prochlorperazine (COMPAZINE) 10 MG tablet Take 10 mg by mouth every 6 (six) hours as needed for nausea or vomiting.    [provider]  promethazine (PHENERGAN) 50 MG suppository Place 50 mg rectally every 8 (eight) hours as needed for nausea or vomiting.    [provider]  propranolol ER (INDERAL LA) 120 MG 24 hr capsule Take 120 mg by mouth daily.    [provider]  REYVOW 100 MG TABS TAKE 1 TABLET BY MOUTH DAILY AS NEEDED 08/11/21   Tomi Likens, Adam R, DO  Rimegepant Sulfate (NURTEC) 75 MG TBDP Take 1 tablet by mouth daily as needed. Patient not taking:  Reported on 02/11/2020 03/04/19   Pieter Partridge, DO  Rimegepant Sulfate (NURTEC) 75 MG TBDP Take 75 mg by mouth as needed. Patient not taking: Reported on 08/12/2020 07/08/19   Pieter Partridge, DO  sertraline (ZOLOFT) 100 MG tablet Take 100 mg by mouth daily. 07/25/11   [provider]  simvastatin ([place])  40 MG tablet Take 40 mg by mouth daily. 08/15/19   [provider]  tiZANidine (ZANAFLEX) 4 MG tablet Take 4 mg by mouth every 12 (twelve) hours as needed for muscle spasms.     [provider]  zonisamide (ZONEGRAN) 100 MG capsule Take 1 capsule (100 mg total) by mouth daily. 03/08/21   Pieter Partridge, DO    Family History Family History  Problem Relation Age of Onset   Migraines Mother    Heart disease Father    Migraines Father    Migraines Daughter    Breast cancer Neg Hx     Social History Social History   Tobacco Use   Smoking status: Never   Smokeless tobacco: Never  Vaping Use   Vaping Use: Never used  Substance Use Topics   Alcohol use: No    Alcohol/week: 0.0 standard drinks   Drug use: No     Allergies   Penicillins, Lipitor [atorvastatin calcium], and Zithromax [azithromycin]   Review of Systems Review of Systems  Constitutional: Negative.   Respiratory: Negative.    Cardiovascular: Negative.   Gastrointestinal: Negative.   Musculoskeletal:  Positive for neck pain. Negative for arthralgias, back pain, gait problem, joint swelling, myalgias and neck stiffness.  Skin: Negative.     Physical Exam Triage Vital Signs ED Triage Vitals [09/14/21 1847]  Enc Vitals Group     BP (!) 158/78     Pulse Rate 98     Resp 16     Temp 98.5 F (36.9 C)     Temp Source Oral     SpO2 99 %     Weight      Height      Head Circumference      Peak Flow      Pain Score      Pain Loc      Pain Edu?      Excl. in [place]?    No data found.  Updated Vital Signs BP (!) 145/85 (BP Location: Left Arm)   Pulse 98   Temp 98.5 F (36.9 C) (Oral)    Resp 16   LMP 01/13/2016 (Exact Date)   SpO2 99%   Visual Acuity Right Eye Distance:   Left Eye Distance:   Bilateral Distance:    Right Eye Near:   Left Eye Near:    Bilateral Near:     Physical Exam Constitutional:      Appearance: Normal appearance.  HENT:     Head: Normocephalic.  Eyes:     Extraocular Movements: Extraocular movements intact.  Pulmonary:     Effort: Pulmonary effort is normal.  Musculoskeletal:     Comments: Tenderness and mild swelling along the right lateral aspect of the neck, starting at the trapezius muscle, range of motion intact, 2+ carotid pulse, No rigidity or deformity noted  Skin:    General: Skin is warm and dry.  Neurological:     Mental Status: She is alert and oriented to person, place, and time. Mental status is at baseline.  Psychiatric:        Mood and Affect: Mood normal.        Behavior: Behavior normal.     UC Treatments / Results  Labs (all labs ordered are listed, but only abnormal results are displayed) Labs Reviewed - No data to display  EKG   Radiology No results found.  Procedures Procedures (including critical care time)  Medications Ordered in UC Medications - No data  to display  Initial Impression / Assessment and Plan / UC Course  I have reviewed the triage vital signs and the nursing notes.  Pertinent labs & imaging results that were available during my care of the patient were reviewed by me and considered in my medical decision making (see chart for details).  Neck pain  Cervical x-ray pending, low suspicion for fracture, discussed with patient prescribed prednisone 40 mg burst and Flexeril to be used outpatient, recommended RICE, heat, pillows for support, daily stretching and activity as tolerated, given walking referral to orthopedics if symptoms continue to persist or worsen Final Clinical Impressions(s) / UC Diagnoses   Final diagnoses:  None   Discharge Instructions   None    ED  Prescriptions   None    PDMP not reviewed this encounter.   Hans Eden, [place] 09/18/21 [phone]

## 2021-09-14 NOTE — ED Triage Notes (Signed)
C/o right sided neck pain x 2-3 days. She is taking Motrin with no relief. No h/o falls.

## 2021-09-14 NOTE — Discharge Instructions (Signed)
Your pain is most likely caused by irritation to the muscles.   Starting tomorrow take prednisone every morning with food for the next 5 days, this is to reduce any inflammation and irritation which in turn will help with your pain  You may use a muscle relaxer at bedtime to provide additional comfort, as well as this medication may make you drowsy  Your x-ray of your neck is pending, you will be notified of any concerning results and told how to move forward  You may use heating pad in 15 minute intervals as needed for additional comfort, within the first 2-3 days you may find comfort in using ice in 10-15 minutes over affected area  Begin stretching affected area daily for 10 minutes as tolerated to further loosen muscles   Can try sleeping without pillow on firm mattress   Practice good posture: head back, shoulders back, chest forward, pelvis back and weight distributed evenly on both legs  If pain persist after recommended treatment or reoccurs if may be beneficial to follow up with orthopedic specialist for evaluation, this doctor specializes in the bones and can manage your symptoms long-term with options such as but not limited to imaging, medications or physical therapy

## 2021-09-15 ENCOUNTER — Telehealth (HOSPITAL_COMMUNITY): Payer: Self-pay | Admitting: Emergency Medicine

## 2021-09-15 NOTE — Telephone Encounter (Signed)
Patient is calling for the results of her x ray that was done yesterday. Please advised. Thanks

## 2021-09-15 NOTE — Telephone Encounter (Signed)
Her x-ray showed degenerative changes particularly in the lower part of her neck (arthritis).  This could be contributing to her pain.  She should use the medications prescribed at her visit but I do recommend she follow-up with an orthopedic if her symptoms are not improving quickly to consider additional evaluation including more advanced imaging (MRI).  She can call to schedule an appointment with any local orthopedic provider that she wishes to see.

## 2021-10-18 ENCOUNTER — Encounter (HOSPITAL_BASED_OUTPATIENT_CLINIC_OR_DEPARTMENT_OTHER): Payer: Self-pay | Admitting: Podiatry

## 2021-10-18 ENCOUNTER — Other Ambulatory Visit: Payer: Self-pay

## 2021-10-26 ENCOUNTER — Ambulatory Visit (HOSPITAL_BASED_OUTPATIENT_CLINIC_OR_DEPARTMENT_OTHER): Admit: 2021-10-26 | Payer: Medicaid Other | Admitting: Podiatry

## 2021-10-26 DIAGNOSIS — Z01818 Encounter for other preprocedural examination: Secondary | ICD-10-CM

## 2021-10-26 SURGERY — BUNIONECTOMY
Anesthesia: General | Site: Toe | Laterality: Left

## 2022-03-22 ENCOUNTER — Other Ambulatory Visit: Payer: Self-pay | Admitting: Internal Medicine

## 2022-03-22 DIAGNOSIS — Z1231 Encounter for screening mammogram for malignant neoplasm of breast: Secondary | ICD-10-CM

## 2022-03-25 ENCOUNTER — Emergency Department (HOSPITAL_COMMUNITY)
Admission: EM | Admit: 2022-03-25 | Discharge: 2022-03-25 | Disposition: A | Discharge status: Home / Self Care | Payer: Medicaid Other | Attending: Emergency Medicine | Admitting: Emergency Medicine

## 2022-03-25 DIAGNOSIS — M542 Cervicalgia: Secondary | ICD-10-CM | POA: Insufficient documentation

## 2022-03-25 MED ORDER — METHOCARBAMOL 500 MG PO TABS: 500.0000 mg | ORAL_TABLET | Freq: Two times a day (BID) | ORAL | 0 refills | Status: DC

## 2022-03-25 MED ORDER — NAPROXEN 500 MG PO TABS: 500.0000 mg | ORAL_TABLET | Freq: Two times a day (BID) | ORAL | 0 refills | Status: DC
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
## 2022-03-27 ENCOUNTER — Ambulatory Visit (INDEPENDENT_AMBULATORY_CARE_PROVIDER_SITE_OTHER): Payer: Medicaid Other | Admitting: Psychiatry

## 2022-03-27 VITALS — BP 140/89 | HR 90 | Ht 71.0 in | Wt 209.2 lb

## 2022-03-27 DIAGNOSIS — G43719 Chronic migraine without aura, intractable, without status migrainosus: Secondary | ICD-10-CM

## 2022-03-27 MED ORDER — AJOVY 225 MG/1.5ML ~~LOC~~ SOAJ
225.0000 mg | SUBCUTANEOUS | 6 refills | Status: DC
Start: 2022-03-27 — End: 2023-03-11

## 2022-03-27 MED ORDER — ZAVZPRET 10 MG/ACT NA SOLN: 10.0000 mg | NASAL | 6 refills | Status: DC | PRN

## 2022-03-27 MED ORDER — REYVOW 100 MG PO TABS: 100.0000 mg | ORAL_TABLET | ORAL | 5 refills | Status: DC | PRN

## 2022-03-27 NOTE — Patient Instructions (Addendum)
Stop Emgality. Start [name] for migraine prevention  Start Zavegepant for migraine rescue. Spray one spray into nostril at onset of headache. Max dose 1 spray in 24 hours. This can be taken with Revyow if needed.
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
## 2022-03-28 ENCOUNTER — Telehealth: Payer: Self-pay

## 2022-03-28 NOTE — Telephone Encounter (Signed)
PA submitted via CMM  Key: [facility] information has been sent to [facility].

## 2022-04-01 ENCOUNTER — Encounter (HOSPITAL_COMMUNITY): Payer: Self-pay | Admitting: *Deleted

## 2022-04-01 ENCOUNTER — Ambulatory Visit (HOSPITAL_COMMUNITY)
Admission: EM | Admit: 2022-04-01 | Discharge: 2022-04-01 | Disposition: A | Discharge status: Home / Self Care | Payer: Medicaid Other

## 2022-04-01 DIAGNOSIS — Z711 Person with feared health complaint in whom no diagnosis is made: Secondary | ICD-10-CM | POA: Diagnosis not present

## 2022-04-01 NOTE — ED Triage Notes (Signed)
She states she put the wrong ear pod in her right ear now it hurts. She just wants to make sure its ok.

## 2022-04-01 NOTE — ED Provider Notes (Signed)
[facility]    CSN: [id number] Arrival date & time: 04/01/22  1109      History   Chief Complaint Chief Complaint  Patient presents with   Ear Injury    Entered by patient    HPI Christine Vega is a 51 y.o. female.  Presents with concern about right ear She put her airpod in wrong yesterday, the longer part went into her ear instead of the ear piece She took it out right away, in once piece No bleeding but feels a little tender No hearing loss  She just wanted to have her ear checked out to make sure She is concerned about a ruptured ear drum  Past Medical History:  Diagnosis Date   Anemia    history    Anxiety    Back pain    rotates to left hip and tailbone   Depression    DUB (dysfunctional uterine bleeding) 12/01/2010   Endometriosis    Fractured coccyx (HCC)    Heart murmur    no problems   History of seasonal allergies    Hyperlipidemia    Migraine    Migraines    tx w/depakote/verapamil per pt   Murmur, heart    Neuromuscular disorder ([place])    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, walks/balance ok   Post traumatic stress disorder (PTSD)    Seizures ([place]) 2004   x 1; unknown source   Uterine leiomyoma     Patient Active Problem List   Diagnosis Date Noted   Nasal turbinate hypertrophy 05/14/2017   Iron deficiency anemia due to chronic blood loss 04/05/2016   Cervical high risk HPV (human papillomavirus) test positive, normal cytology on 02/24/14 03/01/2014   Atypical migraine 06/24/2013   Migraine without aura 06/24/2013   Vaginal pain 12/01/2010    Past Surgical History:  Procedure Laterality Date   CHOLECYSTECTOMY     HERNIA REPAIR     HYSTERECTOMY ABDOMINAL WITH SALPINGECTOMY Left 02/07/2016   Procedure: HYSTERECTOMY ABDOMINAL WITH  left SALPINGECTOMY;  Surgeon: Mora Bellman, MD;  Location: [facility];  Service: Gynecology;  Laterality: Left;   LAPAROSCOPY  05/16/2011   Procedure: LAPAROSCOPY OPERATIVE;  Surgeon: Emeterio Reeve, MD;   Location: [facility];  Service: Gynecology;  Laterality: N/A;  poss oophorectomy   LAPAROSCOPY  08/06/2011   Procedure: LAPAROSCOPY OPERATIVE;  Surgeon: Woodroe Mode, MD;  Location: [facility];  Service: Gynecology;  Laterality: N/A;   LAPAROTOMY     for endometriosis/adhesions   SALPINGOOPHORECTOMY  08/06/2011   Procedure: SALPINGO OOPHERECTOMY;  Surgeon: Woodroe Mode, MD;  Location: [facility];  Service: Gynecology;  Laterality: Right;   SVD     x 2   TUBAL LIGATION     WISDOM TOOTH EXTRACTION      OB History     Gravida  2   Para  2   Term  2   Preterm  0   AB  0   Living  2      SAB  0   IAB  0   Ectopic  0   Multiple  0   Live Births  2            Home Medications    Prior to Admission medications   Medication Sig Start Date End Date Taking? Authorizing Provider  buPROPion (WELLBUTRIN XL) 150 MG 24 hr tablet Take 150 mg by mouth daily.   Yes [provider]  EMGALITY 120 MG/ML SOAJ ADMINISTER 1  ML UNDER THE SKIN EVERY 28 DAYS 08/11/21  Yes Tomi Likens, Adam R, DO  fexofenadine-pseudoephedrine (ALLEGRA-D 24) 180-240 MG 24 hr tablet Take 1 tablet by mouth daily.   Yes [provider]  fluticasone (FLONASE) 50 MCG/ACT nasal spray SHAKE LIQUID AND USE 2 SPRAYS IN EACH NOSTRIL DAILY 10/14/18  Yes [provider]  Fremanezumab-vfrm ([name]) 225 MG/1.5ML SOAJ Inject 225 mg into the skin every 30 (thirty) days. 03/27/22  Yes Genia Harold, MD  gabapentin (NEURONTIN) 300 MG capsule Take 300 mg by mouth 3 (three) times daily.   Yes [provider]  HYDROcodone-acetaminophen (NORCO) 10-325 MG tablet Take 1 tablet by mouth every 6 (six) hours as needed for moderate pain.   Yes [provider]  hydrOXYzine (ATARAX/VISTARIL) 50 MG tablet Take 50 mg by mouth 3 (three) times daily as needed. 11/09/19  Yes [provider]  lamoTRIgine (LAMICTAL) 100 MG tablet Take 100 mg by mouth daily. 11/09/19  Yes [provider]  Lasmiditan  Succinate (REYVOW) 100 MG TABS Take 100 mg by mouth as needed (for migraine). 03/27/22  Yes Genia Harold, MD  loratadine (CLARITIN REDITABS) 10 MG dissolvable tablet Take 10 mg by mouth daily.   Yes [provider]  methocarbamol (ROBAXIN) 500 MG tablet Take 1 tablet (500 mg total) by mouth 2 (two) times daily. 03/25/22  Yes Dorothyann Peng, PA-C  metroNIDAZOLE (FLAGYL) 500 MG tablet Take 500 mg by mouth 2 (two) times daily. 7 days   Yes [provider]  naproxen (NAPROSYN) 500 MG tablet Take 1 tablet (500 mg total) by mouth 2 (two) times daily. 03/25/22  Yes Dorothyann Peng, PA-C  omeprazole (PRILOSEC) 40 MG capsule Take 40 mg by mouth daily. 11/09/19  Yes [provider]  ondansetron (ZOFRAN) 4 MG tablet Take 1 tablet (4 mg total) by mouth every 8 (eight) hours as needed for nausea or vomiting. 02/23/19  Yes Jaffe, Adam R, DO  prochlorperazine (COMPAZINE) 10 MG tablet Take 10 mg by mouth every 6 (six) hours as needed for nausea or vomiting.   Yes [provider]  promethazine (PHENERGAN) 50 MG suppository Place 50 mg rectally every 8 (eight) hours as needed for nausea or vomiting.   Yes [provider]  propranolol ER (INDERAL LA) 120 MG 24 hr capsule Take 120 mg by mouth daily.   Yes [provider]  REYVOW 100 MG TABS TAKE 1 TABLET BY MOUTH DAILY AS NEEDED 08/11/21  Yes Jaffe, Adam R, DO  sertraline (ZOLOFT) 100 MG tablet Take 100 mg by mouth daily. 07/25/11  Yes [provider]  simvastatin (ZOCOR) 40 MG tablet Take 40 mg by mouth daily. 08/15/19  Yes [provider]  tiZANidine (ZANAFLEX) 4 MG tablet Take 4 mg by mouth every 12 (twelve) hours as needed for muscle spasms.    Yes [provider]  Zavegepant HCl (ZAVZPRET) 10 MG/ACT SOLN Place 10 mg into the nose as needed (for migraine). Max dose 1 spray in 24 hours 03/27/22  Yes Chima, Anderson Malta, MD  zonisamide (ZONEGRAN) 100 MG capsule Take 1 capsule (100 mg total)  by mouth daily. 03/08/21  Yes Jaffe, Adam R, DO  AMITIZA 24 MCG capsule Take 24 mcg by mouth 2 (two) times daily. Patient not taking: Reported on 03/27/2022 11/09/19   [provider]    Family History Family History  Problem Relation Age of Onset   Migraines Mother    Heart disease Father    Migraines Father  Migraines Daughter    Breast cancer Neg Hx     Social History Social History   Tobacco Use   Smoking status: Never   Smokeless tobacco: Never  Vaping Use   Vaping Use: Never used  Substance Use Topics   Alcohol use: No    Alcohol/week: 0.0 standard drinks of alcohol   Drug use: No     Allergies   Penicillins, Lipitor [atorvastatin calcium], and Zithromax [azithromycin]   Review of Systems Review of Systems As per HPI  Physical Exam Triage Vital Signs ED Triage Vitals  Enc Vitals Group     BP 04/01/22 1250 131/82     Pulse Rate 04/01/22 1250 89     Resp 04/01/22 1250 18     Temp 04/01/22 1250 98.1 F (36.7 C)     Temp Source 04/01/22 1250 Oral     SpO2 04/01/22 1250 96 %     Weight --      Height --      Head Circumference --      Peak Flow --      Pain Score 04/01/22 1248 6     Pain Loc --      Pain Edu? --      Excl. in [place]? --    No data found.  Updated Vital Signs BP 131/82 (BP Location: Right Arm)   Pulse 89   Temp 98.1 F (36.7 C) (Oral)   Resp 18   LMP 01/13/2016 (Exact Date)   SpO2 96%    Physical Exam Vitals and nursing note reviewed.  Constitutional:      General: She is not in acute distress.    Appearance: Normal appearance.  HENT:     Right Ear: Hearing, tympanic membrane, ear canal and external ear normal. No decreased hearing noted. No laceration, drainage, swelling or tenderness. No middle ear effusion. No foreign body. Tympanic membrane is not perforated.     Left Ear: Hearing, tympanic membrane, ear canal and external ear normal.     Mouth/Throat:     Pharynx: Oropharynx is clear.  Cardiovascular:      Rate and Rhythm: Normal rate and regular rhythm.  Pulmonary:     Effort: Pulmonary effort is normal.  Neurological:     Mental Status: She is alert and oriented to person, place, and time.     UC Treatments / Results  Labs (all labs ordered are listed, but only abnormal results are displayed) Labs Reviewed - No data to display  EKG   Radiology No results found.  Procedures Procedures (including critical care time)  Medications Ordered in UC Medications - No data to display  Initial Impression / Assessment and Plan / UC Course  I have reviewed the triage vital signs and the nursing notes.  Pertinent labs & imaging results that were available during my care of the patient were reviewed by me and considered in my medical decision making (see chart for details).  Normal exam No concerns Discussed with patient no abrasion, laceration, rupture, bruising, foreign body. Recommend give it another day or so, may be tender but there is nothing concerning on exam.  Final Clinical Impressions(s) / UC Diagnoses   Final diagnoses:  Worried well     Discharge Instructions      Ear looks great! No concerns today.  Give it a few more days to fully improve.   Avoid putting anything in the ears for several more days.    ED Prescriptions   None  PDMP not reviewed this encounter.   Kimari Coudriet, [place], [place] 04/01/22 1735

## 2022-04-01 NOTE — Discharge Instructions (Signed)
Ear looks great! No concerns today.  Give it a few more days to fully improve.   Avoid putting anything in the ears for several more days.

## 2022-04-02 ENCOUNTER — Telehealth: Payer: Self-pay

## 2022-04-02 NOTE — Telephone Encounter (Signed)
PA submitted via CMM Key: [id number]  Awaiting determination.

## 2022-04-02 NOTE — Telephone Encounter (Addendum)
This drug has been approved. Approved quantity: 6 sprays per 30 day(s). You may fill up to a  34 day supply at a retail pharmacy. You may fill up to a 90 day supply for maintenance drugs,  please refer to the formulary for details. Please call the pharmacy to process your prescription  claim. Approval Timeframe: Start Date 03/28/2022 End Date Until Further Notice  Pharmacy notified.

## 2022-04-04 NOTE — Telephone Encounter (Addendum)
Contacted [employer](option forms), spoke to [place] to initiate appeal. Appeal form is being faxed to [employer].    Appeal form received, compeleted and placed on MD desk for signature.

## 2022-04-04 NOTE — Telephone Encounter (Addendum)
Request was denied, will appeal decision  as PA did not ask the following.   The following criteria from the health plan guideline, Migraine Therapy Calcitonin Gene-Related Inhibitors,  must be met before we can approve this request. Please send [place] supporting chart notes and lab results. 1. Beneficiary does not have medication over-use headache [facility]); 2. Beneficiaries that are women of childbearing age have had a negative pregnancy test at baseline;(not  required for Nurtec ODT or Qulipta) 3. Beneficiary has 4 or more migraine days per month for at least 3 months; 4. Beneficiary is utilizing prophylactic intervention modalities (e.g. behavioral therapy, physical therapy, lifestyle modifications)

## 2022-04-18 ENCOUNTER — Telehealth: Payer: Self-pay

## 2022-04-18 NOTE — Telephone Encounter (Signed)
Quantity request of 15 tab is more than plan allows. Approved for 12 tablets per 30 days. Pharmacy noticed,

## 2022-04-18 NOTE — Telephone Encounter (Signed)
I don't have anything on my desk, so it's possible it got lost if you guys don't have it

## 2022-04-18 NOTE — Telephone Encounter (Signed)
Did you receive and review this appeal ?

## 2022-04-18 NOTE — Telephone Encounter (Signed)
Form found, signed and faxed back to [phone]. Confirmation received.

## 2022-04-18 NOTE — Telephone Encounter (Signed)
PA submitted via CMM Key: [id number] Your information has been sent to [facility].

## 2022-04-26 NOTE — Telephone Encounter (Signed)
Appeal approved 04/16/22-07/25/22 Pharmacy notified

## 2022-05-02 NOTE — Telephone Encounter (Signed)
Pt is asking for a call from RN to discuss the appeal approval on the Fremanezumab-vfrm ([name]) 225 MG/1.5ML SOAJ ,

## 2022-05-02 NOTE — Telephone Encounter (Signed)
Contacted pt back, she stated she got a letter in mail regarding Reyvow and [name] that they were denied. I advised her I handled the appeals already and they were approved. She was appreciative.

## 2022-05-17 ENCOUNTER — Telehealth: Payer: Self-pay | Admitting: Neurology

## 2022-05-17 ENCOUNTER — Ambulatory Visit: Payer: Medicaid Other

## 2022-05-17 NOTE — Telephone Encounter (Signed)
Patient was dismissed from our office on 08-30-21   Diamond from [facility] would like to speak to someone about patient Christine Vega and [name] I have sent approvals earlier this month and she has tried to call pharmacy and was on hold for 20 mins with out reaching anyone she needs to know what she can do to get patient medication  she would like to speak to someone

## 2022-05-17 NOTE — Telephone Encounter (Signed)
I advised to [facility], patient is no longer at this practice.

## 2022-05-22 ENCOUNTER — Ambulatory Visit: Payer: Medicaid Other

## 2022-05-24 ENCOUNTER — Inpatient Hospital Stay: Admission: RE | Admit: 2022-05-24 | Payer: Medicaid Other | Source: Ambulatory Visit

## 2022-06-14 ENCOUNTER — Ambulatory Visit
Admission: RE | Admit: 2022-06-14 | Discharge: 2022-06-14 | Disposition: A | Discharge status: Home / Self Care | Payer: Medicaid Other | Source: Ambulatory Visit | Attending: Internal Medicine | Admitting: Internal Medicine

## 2022-06-14 DIAGNOSIS — Z1231 Encounter for screening mammogram for malignant neoplasm of breast: Secondary | ICD-10-CM

## 2022-06-21 ENCOUNTER — Other Ambulatory Visit: Payer: Self-pay

## 2022-06-21 ENCOUNTER — Encounter (HOSPITAL_COMMUNITY): Payer: Self-pay | Admitting: Emergency Medicine

## 2022-06-21 ENCOUNTER — Emergency Department (HOSPITAL_COMMUNITY)
Admission: EM | Admit: 2022-06-21 | Discharge: 2022-06-21 | Disposition: A | Discharge status: Home / Self Care | Payer: Medicaid Other | Attending: Emergency Medicine | Admitting: Emergency Medicine

## 2022-06-21 DIAGNOSIS — R197 Diarrhea, unspecified: Secondary | ICD-10-CM | POA: Diagnosis not present

## 2022-06-21 DIAGNOSIS — Z79899 Other long term (current) drug therapy: Secondary | ICD-10-CM | POA: Insufficient documentation

## 2022-06-21 DIAGNOSIS — T7840XA Allergy, unspecified, initial encounter: Secondary | ICD-10-CM | POA: Insufficient documentation

## 2022-06-21 DIAGNOSIS — S0990XA Unspecified injury of head, initial encounter: Secondary | ICD-10-CM

## 2022-06-21 DIAGNOSIS — W228XXA Striking against or struck by other objects, initial encounter: Secondary | ICD-10-CM | POA: Insufficient documentation
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
## 2022-06-21 NOTE — ED Provider Notes (Signed)
[facility] Provider Note   CSN: [id number] Arrival date & time: 06/21/22  0346     History  Chief Complaint  Patient presents with   Head Injury    Christine Vega is a 52 y.o. female.  The history is provided by the patient.  Patient history of anxiety, migraines presents with multiple complaints.  Patient reports she had a gastrointestinal illness last week and had multiple episodes of nonbloody diarrhea.  She reports she still feels dehydrated.  She reports she ate at [place] yesterday and after eating black beans, she felt that her throat was itchy and her face started to burn.  That is now starting to improve spontaneously.  No rash reported no chest pain or shortness of breath.  Patient also reports last night when she got out of her car she hit her head and has had a headache.  No LOC.  She is not on anticoagulation.  She reports she went to bed, and when she woke up she felt groggy.  Denies any confusion.  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 Authorizing Provider  AMITIZA 24 MCG capsule Take 24 mcg by mouth 2 (two) times daily. Patient not taking: Reported on 03/27/2022 11/09/19   [provider]  buPROPion (WELLBUTRIN XL) 150 MG 24 hr tablet Take  150 mg by mouth daily.    [provider]  EMGALITY 120 MG/ML SOAJ ADMINISTER 1 ML UNDER THE SKIN EVERY 28 DAYS 08/11/21   Tomi Likens, Adam R, DO  fexofenadine-pseudoephedrine (ALLEGRA-D 24) 180-240 MG 24 hr tablet Take 1 tablet by mouth daily.    [provider]  fluticasone (FLONASE) 50 MCG/ACT nasal spray SHAKE LIQUID AND USE 2 SPRAYS IN EACH NOSTRIL DAILY 10/14/18   [provider]  Fremanezumab-vfrm ([name]) 225 MG/1.5ML SOAJ Inject 225 mg into the skin every 30 (thirty) days. 03/27/22   Genia Harold, MD  gabapentin (NEURONTIN) 300 MG capsule Take 300 mg by mouth 3 (three) times daily.    [provider]  HYDROcodone-acetaminophen (NORCO) 10-325 MG tablet Take 1 tablet by mouth every 6 (six) hours as needed for moderate pain.    [provider]  hydrOXYzine (ATARAX/VISTARIL) 50 MG tablet Take 50 mg by mouth 3 (three) times daily as needed. 11/09/19   [provider]  lamoTRIgine (LAMICTAL) 100 MG tablet Take 100 mg by mouth daily. 11/09/19   [provider]  Lasmiditan Succinate (REYVOW) 100 MG TABS Take 100 mg by mouth as needed (for migraine). 03/27/22   Genia Harold, MD  loratadine (CLARITIN REDITABS) 10 MG dissolvable tablet  Take 10 mg by mouth daily.    [provider]  methocarbamol (ROBAXIN) 500 MG tablet Take 1 tablet (500 mg total) by mouth 2 (two) times daily. 03/25/22   Dorothyann Peng, PA-C  metroNIDAZOLE (FLAGYL) 500 MG tablet Take 500 mg by mouth 2 (two) times daily. 7 days    [provider]  naproxen (NAPROSYN) 500 MG tablet Take 1 tablet (500 mg total) by mouth 2 (two) times daily. 03/25/22   Dorothyann Peng, PA-C  omeprazole (PRILOSEC) 40 MG capsule Take 40 mg by mouth daily. 11/09/19   [provider]  ondansetron (ZOFRAN) 4 MG tablet Take 1 tablet (4 mg total) by mouth every 8 (eight) hours as needed for nausea or vomiting. 02/23/19   Pieter Partridge, DO  prochlorperazine (COMPAZINE) 10 MG  tablet Take 10 mg by mouth every 6 (six) hours as needed for nausea or vomiting.    [provider]  promethazine (PHENERGAN) 50 MG suppository Place 50 mg rectally every 8 (eight) hours as needed for nausea or vomiting.    [provider]  propranolol ER (INDERAL LA) 120 MG 24 hr capsule Take 120 mg by mouth daily.    [provider]  REYVOW 100 MG TABS TAKE 1 TABLET BY MOUTH DAILY AS NEEDED 08/11/21   Tomi Likens, Adam R, DO  sertraline (ZOLOFT) 100 MG tablet Take 100 mg by mouth daily. 07/25/11   [provider]  simvastatin (ZOCOR) 40 MG tablet Take 40 mg by mouth daily. 08/15/19   [provider]  tiZANidine (ZANAFLEX) 4 MG tablet Take 4 mg by mouth every 12 (twelve) hours as needed for muscle spasms.     [provider]  Zavegepant HCl (ZAVZPRET) 10 MG/ACT SOLN Place 10 mg into the nose as needed (for migraine). Max dose 1 spray in 24 hours 03/27/22   Genia Harold, MD  zonisamide (ZONEGRAN) 100 MG capsule Take 1 capsule (100 mg total) by mouth daily. 03/08/21   Pieter Partridge, DO      Allergies    Penicillins, Lipitor [atorvastatin calcium], and Zithromax [azithromycin]    Review of Systems   Review of Systems  Constitutional:  Negative for fever.  Gastrointestinal:  Negative for vomiting.  Neurological:  Positive for headaches.    Physical Exam Updated Vital Signs BP 123/80 (BP Location: Right Arm)   Pulse 82   Temp 97.9 F (36.6 C) (Oral)   Resp 18   Ht 1.803 m ('5\' 11"'$ )   Wt 95 kg   LMP 01/13/2016 (Exact Date)   SpO2 99%   BMI 29.21 kg/m  Physical Exam CONSTITUTIONAL: Well developed/well nourished HEAD: Normocephalic/atraumatic, no visible trauma to the scalp EYES: EOMI/PERRL ENMT: Mucous membranes moist, uvula midline, no erythema or exudates.  No stridor, no drooling, no angioedema NECK: supple no meningeal signs CV: S1/S2 noted, no murmurs/rubs/gallops noted LUNGS: Lungs are clear to auscultation bilaterally, no  apparent distress ABDOMEN: soft NEURO: Pt is awake/alert/appropriate, moves all extremitiesx4.  No facial droop.  No ataxia.  No facial droop No arm or leg drift EXTREMITIES: pulses normal/equal, full ROM SKIN: warm, no rash PSYCH: no abnormalities of mood noted, alert and oriented to situation  ED Results / Procedures / Treatments   Labs (all labs ordered are listed, but only abnormal results are displayed) Labs Reviewed - No data to display  EKG None  Radiology No results found.  Procedures Procedures    Medications Ordered in ED Medications - No data to display  ED Course/ Medical Decision Making/ A&P                             Medical Decision Making  Patient presents for multiple complaints, thought she had a allergic reaction due to eating black beans.  No visible sign of allergic type reaction or anaphylaxis.  Patient also concerned after hitting her head recently, denies LOC, no neurodeficits, GCS 15, she is ambulatory.  She is safe for discharge        Final Clinical Impression(s) / ED Diagnoses Final diagnoses:  Injury of head, initial encounter  Allergic reaction, initial encounter    Rx / DC Orders ED Discharge Orders     None         Ripley Fraise, MD 06/21/22 [phone]

## 2022-06-23 ENCOUNTER — Encounter (HOSPITAL_COMMUNITY): Payer: Self-pay

## 2022-06-23 ENCOUNTER — Emergency Department (HOSPITAL_COMMUNITY)
Admission: EM | Admit: 2022-06-23 | Discharge: 2022-06-23 | Disposition: A | Discharge status: Home / Self Care | Payer: Medicaid Other | Attending: Emergency Medicine | Admitting: Emergency Medicine

## 2022-06-23 ENCOUNTER — Other Ambulatory Visit: Payer: Self-pay

## 2022-06-23 DIAGNOSIS — M79605 Pain in left leg: Secondary | ICD-10-CM | POA: Insufficient documentation

## 2022-06-23 DIAGNOSIS — J302 Other seasonal allergic rhinitis: Secondary | ICD-10-CM | POA: Insufficient documentation

## 2022-06-23 MED ORDER — PREDNISONE 20 MG PO TABS: ORAL_TABLET | ORAL | 0 refills | Status: DC

## 2022-06-23 MED ORDER — METHOCARBAMOL 500 MG PO TABS: 500.0000 mg | ORAL_TABLET | Freq: Two times a day (BID) | ORAL | 0 refills | Status: DC

## 2022-06-23 MED ORDER — CETIRIZINE HCL 10 MG PO TABS: 10.0000 mg | ORAL_TABLET | Freq: Every day | ORAL | 0 refills | Status: DC

## 2022-06-23 NOTE — ED Triage Notes (Signed)
Pt reports sharp pain and burning to left lateral thigh and hip that started tonight. She is ambulatory with independent steady gait.

## 2022-06-23 NOTE — Discharge Instructions (Signed)
Take the prescribed medication as directed.  Can re-start your eye drops if they help. Keep an eye on your salt intake, dont overdo it. Follow-up with your primary care doctor. Return to the ED for new or worsening symptoms.
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
## 2022-07-08 ENCOUNTER — Encounter (HOSPITAL_COMMUNITY): Payer: Self-pay | Admitting: Emergency Medicine

## 2022-07-08 ENCOUNTER — Emergency Department (HOSPITAL_COMMUNITY)
Admission: EM | Admit: 2022-07-08 | Discharge: 2022-07-08 | Disposition: A | Discharge status: Home / Self Care | Payer: Medicaid Other | Attending: Emergency Medicine | Admitting: Emergency Medicine

## 2022-07-08 ENCOUNTER — Other Ambulatory Visit: Payer: Self-pay

## 2022-07-08 DIAGNOSIS — R197 Diarrhea, unspecified: Secondary | ICD-10-CM | POA: Diagnosis not present

## 2022-07-08 DIAGNOSIS — E860 Dehydration: Secondary | ICD-10-CM | POA: Diagnosis not present

## 2022-07-08 DIAGNOSIS — R531 Weakness: Secondary | ICD-10-CM | POA: Diagnosis not present

## 2022-07-08 DIAGNOSIS — R112 Nausea with vomiting, unspecified: Secondary | ICD-10-CM | POA: Diagnosis not present

## 2022-07-08 DIAGNOSIS — R5381 Other malaise: Secondary | ICD-10-CM | POA: Diagnosis not present

## 2022-07-08 DIAGNOSIS — Z20822 Contact with and (suspected) exposure to covid-19: Secondary | ICD-10-CM | POA: Insufficient documentation

## 2022-07-08 LAB — CBC
HCT: 41.6 % (ref 36.0–46.0)
Hemoglobin: 13 g/dL (ref 12.0–15.0)
MCH: 27.6 pg (ref 26.0–34.0)
MCHC: 31.3 g/dL (ref 30.0–36.0)
MCV: 88.3 fL (ref 80.0–100.0)
Platelets: 283 10*3/uL (ref 150–400)
RBC: 4.71 MIL/uL (ref 3.87–5.11)
RDW: 15.1 % (ref 11.5–15.5)
WBC: 7.6 10*3/uL (ref 4.0–10.5)
nRBC: 0 % (ref 0.0–0.2)

## 2022-07-08 LAB — COMPREHENSIVE METABOLIC PANEL
ALT: 25 U/L (ref 0–44)
AST: 23 U/L (ref 15–41)
Albumin: 4.2 g/dL (ref 3.5–5.0)
Alkaline Phosphatase: 65 U/L (ref 38–126)
Anion gap: 11 (ref 5–15)
BUN: 7 mg/dL (ref 6–20)
CO2: 25 mmol/L (ref 22–32)
Calcium: 9.7 mg/dL (ref 8.9–10.3)
Chloride: 100 mmol/L (ref 98–111)
Creatinine, Ser: 0.88 mg/dL (ref 0.44–1.00)
GFR, Estimated: >60 mL/min (ref >60)
Glucose, Bld: 117 mg/dL — ABNORMAL HIGH (ref 70–99)
Potassium: 4.1 mmol/L (ref 3.5–5.1)
Sodium: 136 mmol/L (ref 135–145)
Total Bilirubin: 0.2 mg/dL — ABNORMAL LOW (ref 0.3–1.2)
Total Protein: 7.3 g/dL (ref 6.5–8.1)

## 2022-07-08 LAB — RESP PANEL BY RT-PCR (RSV, FLU A&B, COVID)  RVPGX2
Influenza A by PCR: NEGATIVE
Influenza B by PCR: NEGATIVE
Resp Syncytial Virus by PCR: NEGATIVE
SARS Coronavirus 2 by RT PCR: NEGATIVE

## 2022-07-08 MED ORDER — ACETAMINOPHEN 500 MG PO TABS
1000.0000 mg | ORAL_TABLET | Freq: Once | ORAL | Status: AC
  Administered 2022-07-08: 1000 mg via ORAL
  Filled 2022-07-08: qty 2

## 2022-07-08 MED ORDER — ONDANSETRON HCL 4 MG PO TABS: 4.0000 mg | ORAL_TABLET | Freq: Four times a day (QID) | ORAL | 0 refills | Status: DC

## 2022-07-08 MED ORDER — LACTATED RINGERS IV BOLUS
1000.0000 mL | Freq: Once | INTRAVENOUS | Status: AC
  Administered 2022-07-08: 1000 mL via INTRAVENOUS

## 2022-07-08 MED ORDER — ONDANSETRON HCL 4 MG/2ML IJ SOLN
4.0000 mg | Freq: Once | INTRAMUSCULAR | Status: AC
  Administered 2022-07-08: 4 mg via INTRAVENOUS
  Filled 2022-07-08: qty 2

## 2022-07-08 NOTE — ED Provider Notes (Signed)
[facility] Provider Note   CSN: [id number] Arrival date & time: 07/08/22  1536     History {Add pertinent medical, surgical, social history, OB history to HPI:1} Chief Complaint  Patient presents with   Abdominal Pain   Dehydration    Christine Vega is a 52 y.o. female with recent history of gastroenteritis involving nonbloody diarrhea past 2 weeks that has been improving.  She has been having formed stool in the past few days.  However, she has had decreased appetite, has not been tolerating much p.o. intake over the past 3 days, having chills but no recorded fever, and headache that has been responsive to Motrin and Tylenol at home.  She is also having generalized weakness as well as myalgias. Denies cough, shortness of breath, chest pain, sore throat, dysuria, hematuria.   Abdominal Pain      Home Medications Prior to Admission medications   Medication Sig Start Date End Date Taking? Authorizing Provider  AMITIZA 24 MCG capsule Take 24 mcg by mouth 2 (two) times daily. Patient not taking: Reported on 03/27/2022 11/09/19   [provider]  buPROPion (WELLBUTRIN XL) 150 MG 24 hr tablet Take 150 mg by mouth daily.    [provider]  cetirizine (ZYRTEC ALLERGY) 10 MG tablet Take 1 tablet (10 mg total) by mouth daily. 06/23/22   Larene Pickett, PA-C  EMGALITY 120 MG/ML SOAJ ADMINISTER 1 ML UNDER THE SKIN EVERY 28 DAYS 08/11/21   Pieter Partridge, DO  fexofenadine-pseudoephedrine (ALLEGRA-D 24) 180-240 MG 24 hr tablet Take 1 tablet by mouth daily.    [provider]  fluticasone (FLONASE) 50 MCG/ACT nasal spray SHAKE LIQUID AND USE 2 SPRAYS IN EACH NOSTRIL DAILY 10/14/18   [provider]  Fremanezumab-vfrm ([name]) 225 MG/1.5ML SOAJ Inject 225 mg into the skin every 30 (thirty) days. 03/27/22   Genia Harold, MD  gabapentin (NEURONTIN) 300 MG capsule Take 300 mg by mouth 3 (three) times daily.     [provider]  HYDROcodone-acetaminophen (NORCO) 10-325 MG tablet Take 1 tablet by mouth every 6 (six) hours as needed for moderate pain.    [provider]  hydrOXYzine (ATARAX/VISTARIL) 50 MG tablet Take 50 mg by mouth 3 (three) times daily as needed. 11/09/19   [provider]  lamoTRIgine (LAMICTAL) 100 MG tablet Take 100 mg by mouth daily. 11/09/19   [provider]  Lasmiditan Succinate (REYVOW) 100 MG TABS Take 100 mg by mouth as needed (for migraine). 03/27/22   Genia Harold, MD  loratadine (CLARITIN REDITABS) 10 MG dissolvable tablet Take 10 mg by mouth daily.    [provider]  methocarbamol (ROBAXIN) 500 MG tablet Take 1 tablet (500 mg total) by mouth 2 (two) times daily. 06/23/22   Larene Pickett, PA-C  metroNIDAZOLE (FLAGYL) 500 MG tablet Take 500 mg by mouth 2 (two) times daily. 7 days    [provider]  naproxen (NAPROSYN) 500 MG tablet Take 1 tablet (500 mg total) by mouth 2 (two) times daily. 03/25/22   Dorothyann Peng, PA-C  omeprazole (PRILOSEC) 40 MG capsule Take 40 mg by mouth daily. 11/09/19   [provider]  ondansetron (ZOFRAN) 4 MG tablet Take 1 tablet (4 mg total) by mouth every 8 (eight) hours as needed for nausea or vomiting. 02/23/19   Pieter Partridge, DO  predniSONE (DELTASONE) 20 MG tablet Take 40 mg by mouth daily for 3 days, then 20mg  by  mouth daily for 3 days, then 10mg  daily for 3 days 06/23/22   Larene Pickett, PA-C  prochlorperazine (COMPAZINE) 10 MG tablet Take 10 mg by mouth every 6 (six) hours as needed for nausea or vomiting.    [provider]  promethazine (PHENERGAN) 50 MG suppository Place 50 mg rectally every 8 (eight) hours as needed for nausea or vomiting.    [provider]  propranolol ER (INDERAL LA) 120 MG 24 hr capsule Take 120 mg by mouth daily.    [provider]  REYVOW 100 MG TABS TAKE 1 TABLET BY MOUTH DAILY AS NEEDED 08/11/21   Tomi Likens, Adam R, DO   sertraline (ZOLOFT) 100 MG tablet Take 100 mg by mouth daily. 07/25/11   [provider]  simvastatin (ZOCOR) 40 MG tablet Take 40 mg by mouth daily. 08/15/19   [provider]  tiZANidine (ZANAFLEX) 4 MG tablet Take 4 mg by mouth every 12 (twelve) hours as needed for muscle spasms.     [provider]  Zavegepant HCl (ZAVZPRET) 10 MG/ACT SOLN Place 10 mg into the nose as needed (for migraine). Max dose 1 spray in 24 hours 03/27/22   Genia Harold, MD  zonisamide (ZONEGRAN) 100 MG capsule Take 1 capsule (100 mg total) by mouth daily. 03/08/21   Pieter Partridge, DO      Allergies    Penicillins, Lipitor [atorvastatin calcium], and Zithromax [azithromycin]    Review of Systems   Review of Systems  Gastrointestinal:  Positive for abdominal pain.  See HPI  Physical Exam Updated Vital Signs BP (!) 128/90   Pulse (!) 104   Temp 98.6 F (37 C) (Oral)   Resp 18   Ht 5\' 11"  (1.803 m)   Wt 91.9 kg   LMP 01/13/2016 (Exact Date)   SpO2 98%   BMI 28.26 kg/m  Physical Exam  ED Results / Procedures / Treatments   Labs (all labs ordered are listed, but only abnormal results are displayed) Labs Reviewed  RESP PANEL BY RT-PCR (RSV, FLU A&B, COVID)  RVPGX2  COMPREHENSIVE METABOLIC PANEL  CBC    EKG None  Radiology No results found.  Procedures Procedures  {Document cardiac monitor, telemetry assessment procedure when appropriate:1}  Medications Ordered in ED Medications  acetaminophen (TYLENOL) tablet 1,000 mg (has no administration in time range)  lactated ringers bolus 1,000 mL (has no administration in time range)  ondansetron (ZOFRAN) injection 4 mg (has no administration in time range)    ED Course/ Medical Decision Making/ A&P   {   Click here for ABCD2, HEART and other calculatorsREFRESH Note before signing :1}                          Medical Decision Making  ***  {Document critical care time when appropriate:1} {Document review of labs  and clinical decision tools ie heart score, Chads2Vasc2 etc:1}  {Document your independent review of radiology images, and any outside records:1} {Document your discussion with family members, caretakers, and with consultants:1} {Document social determinants of health affecting pt's care:1} {Document your decision making why or why not admission, treatments were needed:1} Final Clinical Impression(s) / ED Diagnoses Final diagnoses:  None    Rx / DC Orders ED Discharge Orders     None

## 2022-07-08 NOTE — Discharge Instructions (Addendum)
Your labs appear to be normal and you are covid and flu negative. You likely do have some residual symptoms from your illness before especially losing so much fluid with the diarrhea.   You got some fluids here that helped quite a bit, but you should continue to push fluids for the next few days.  As we discussed you can drink propel or Pedialyte to help your electrolytes.  You have been given a prescription for Zofran to take as needed for nausea and to help keep food and fluids down over the next few days.  This dissolves under your tongue you can use that as needed

## 2022-07-08 NOTE — ED Notes (Signed)
Assumed care of pt who was seen on march 7 march 9 and today for similar c/o of seasonal allergies dehydration, malaise and generalized body aches. Pt a/o x 4 respirations even and non labored ambulatory with steady gait and vs wnl. Pt pending dx results

## 2022-07-08 NOTE — ED Triage Notes (Signed)
Pt reports she has been dealing with abdominal pain for the last 2 weeks. Seen here recently for the same. Denies nausea, vomiting, reports some diarrhea. Also c/o hot flashes. Pt reports she thinks she's dehydrated. Overall, just not feeling well. Pt awake, alert, appropriate. VSS.

## 2022-07-31 ENCOUNTER — Ambulatory Visit (HOSPITAL_COMMUNITY): Payer: Medicaid Other

## 2022-08-01 ENCOUNTER — Ambulatory Visit (HOSPITAL_COMMUNITY): Payer: Medicaid Other

## 2022-08-04 ENCOUNTER — Encounter (HOSPITAL_COMMUNITY): Payer: Self-pay

## 2022-08-04 ENCOUNTER — Ambulatory Visit (HOSPITAL_COMMUNITY): Payer: Medicaid Other

## 2022-08-04 ENCOUNTER — Ambulatory Visit (HOSPITAL_COMMUNITY)
Admission: EM | Admit: 2022-08-04 | Discharge: 2022-08-04 | Disposition: A | Discharge status: Home / Self Care | Payer: Medicaid Other | Attending: Emergency Medicine | Admitting: Emergency Medicine

## 2022-08-04 DIAGNOSIS — L299 Pruritus, unspecified: Secondary | ICD-10-CM | POA: Diagnosis not present

## 2022-08-04 MED ORDER — CETIRIZINE HCL 10 MG PO TABS: 10.0000 mg | ORAL_TABLET | Freq: Every day | ORAL | 2 refills | Status: DC

## 2022-08-04 MED ORDER — FAMOTIDINE 20 MG PO TABS: 20.0000 mg | ORAL_TABLET | Freq: Every evening | ORAL | 2 refills | Status: DC

## 2022-08-04 NOTE — ED Provider Notes (Signed)
[facility]    CSN: [id number] Arrival date & time: 08/04/22  1518      History   Chief Complaint Chief Complaint  Patient presents with   Rash   Facial Injury    HPI Christine Vega is a 52 y.o. female.  Here for itching over the body. She was worried about hives. Had some rash on the inner arms. She thinks this is due to her laundry detergent as she changed brands before the itching started. She is not having any shortness of breath, trouble breathing, or swelling. Denies any new medications or foods.   Also bumped her left face while getting into the car last week. Had some swelling at first but it went away. No pain. No dizziness or headache.   Past Medical History:  Diagnosis Date   Anemia    history    Anxiety    Back pain    rotates to left hip and tailbone   Depression    DUB (dysfunctional uterine bleeding) 12/01/2010   Endometriosis    Fractured coccyx    Heart murmur    no problems   History of seasonal allergies    Hyperlipidemia    Migraine    Migraines    tx w/depakote/verapamil per pt   Murmur, heart    Neuromuscular disorder    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, walks/balance ok   Post traumatic stress disorder (PTSD)    Seizures 2004   x 1; unknown source   Uterine leiomyoma     Patient Active Problem List   Diagnosis Date Noted   Nasal turbinate hypertrophy 05/14/2017   Iron deficiency anemia due to chronic blood loss 04/05/2016   Cervical high risk HPV (human papillomavirus) test positive, normal cytology on 02/24/14 03/01/2014   Atypical migraine 06/24/2013   Migraine without aura 06/24/2013   Vaginal pain 12/01/2010    Past Surgical History:  Procedure Laterality Date   CHOLECYSTECTOMY     HERNIA REPAIR     HYSTERECTOMY ABDOMINAL WITH SALPINGECTOMY Left 02/07/2016   Procedure: HYSTERECTOMY ABDOMINAL WITH  left SALPINGECTOMY;  Surgeon: Catalina Antigua, MD;  Location: [facility];  Service: Gynecology;  Laterality: Left;   LAPAROSCOPY   05/16/2011   Procedure: LAPAROSCOPY OPERATIVE;  Surgeon: Scheryl Darter, MD;  Location: [facility];  Service: Gynecology;  Laterality: N/A;  poss oophorectomy   LAPAROSCOPY  08/06/2011   Procedure: LAPAROSCOPY OPERATIVE;  Surgeon: Adam Phenix, MD;  Location: [facility];  Service: Gynecology;  Laterality: N/A;   LAPAROTOMY     for endometriosis/adhesions   SALPINGOOPHORECTOMY  08/06/2011   Procedure: SALPINGO OOPHERECTOMY;  Surgeon: Adam Phenix, MD;  Location: [facility];  Service: Gynecology;  Laterality: Right;   SVD     x 2   TUBAL LIGATION     WISDOM TOOTH EXTRACTION      OB History     Gravida  2   Para  2   Term  2   Preterm  0   AB  0   Living  2      SAB  0   IAB  0   Ectopic  0   Multiple  0   Live Births  2            Home Medications    Prior to Admission medications   Medication Sig Start Date End Date Taking? Authorizing Provider  buPROPion (WELLBUTRIN XL) 150 MG 24 hr tablet Take 150 mg by mouth daily.  Yes [provider]  cetirizine (ZYRTEC ALLERGY) 10 MG tablet Take 1 tablet (10 mg total) by mouth daily. 08/04/22  Yes Keoni Risinger, Lurena Joiner, PA-C  famotidine (PEPCID) 20 MG tablet Take 1 tablet (20 mg total) by mouth at bedtime. 08/04/22  Yes Raylon Lamson, PA-C  fluticasone (FLONASE) 50 MCG/ACT nasal spray SHAKE LIQUID AND USE 2 SPRAYS IN EACH NOSTRIL DAILY 10/14/18  Yes [provider]  Fremanezumab-vfrm ([name]) 225 MG/1.5ML SOAJ Inject 225 mg into the skin every 30 (thirty) days. 03/27/22  Yes Ocie Doyne, MD  gabapentin (NEURONTIN) 300 MG capsule Take 300 mg by mouth 3 (three) times daily.   Yes [provider]  HYDROcodone-acetaminophen (NORCO) 10-325 MG tablet Take 1 tablet by mouth every 6 (six) hours as needed for moderate pain.   Yes [provider]  hydrOXYzine (ATARAX/VISTARIL) 50 MG tablet Take 50 mg by mouth 3 (three) times daily as needed. 11/09/19  Yes [provider]  lamoTRIgine (LAMICTAL) 100 MG  tablet Take 100 mg by mouth daily. 11/09/19  Yes [provider]  Lasmiditan Succinate (REYVOW) 100 MG TABS Take 100 mg by mouth as needed (for migraine). 03/27/22  Yes Ocie Doyne, MD  methocarbamol (ROBAXIN) 500 MG tablet Take 1 tablet (500 mg total) by mouth 2 (two) times daily. 06/23/22  Yes Garlon Hatchet, PA-C  omeprazole (PRILOSEC) 40 MG capsule Take 40 mg by mouth daily. 11/09/19  Yes [provider]  prochlorperazine (COMPAZINE) 10 MG tablet Take 10 mg by mouth every 6 (six) hours as needed for nausea or vomiting.   Yes [provider]  promethazine (PHENERGAN) 50 MG suppository Place 50 mg rectally every 8 (eight) hours as needed for nausea or vomiting.   Yes [provider]  propranolol ER (INDERAL LA) 120 MG 24 hr capsule Take 120 mg by mouth daily.   Yes [provider]  REYVOW 100 MG TABS TAKE 1 TABLET BY MOUTH DAILY AS NEEDED 08/11/21  Yes Jaffe, Adam R, DO  sertraline (ZOLOFT) 100 MG tablet Take 100 mg by mouth daily. 07/25/11  Yes [provider]  simvastatin (ZOCOR) 40 MG tablet Take 40 mg by mouth daily. 08/15/19  Yes [provider]  tiZANidine (ZANAFLEX) 4 MG tablet Take 4 mg by mouth every 12 (twelve) hours as needed for muscle spasms.    Yes [provider]  Zavegepant HCl (ZAVZPRET) 10 MG/ACT SOLN Place 10 mg into the nose as needed (for migraine). Max dose 1 spray in 24 hours 03/27/22  Yes Ocie Doyne, MD    Family History Family History  Problem Relation Age of Onset   Migraines Mother    Heart disease Father    Migraines Father    Migraines Daughter    Breast cancer Neg Hx     Social History Social History   Tobacco Use   Smoking status: Never   Smokeless tobacco: Never  Vaping Use   Vaping Use: Never used  Substance Use Topics   Alcohol use: No    Alcohol/week: 0.0 standard drinks of alcohol   Drug use: No     Allergies   Penicillins, Lipitor [atorvastatin calcium], Mushroom  extract complex, Spinach, and Zithromax [azithromycin]   Review of Systems Review of Systems  As per HPI  Physical Exam Triage Vital Signs ED Triage Vitals  Enc Vitals Group     BP 08/04/22 1644 138/83     Pulse Rate 08/04/22 1644 95     Resp 08/04/22 1644 18  Temp 08/04/22 1644 98.3 F (36.8 C)     Temp Source 08/04/22 1644 Oral     SpO2 08/04/22 1644 96 %     Weight 08/04/22 1640 210 lb (95.3 kg)     Height --      Head Circumference --      Peak Flow --      Pain Score 08/04/22 1640 6     Pain Loc --      Pain Edu? --      Excl. in GC? --    No data found.  Updated Vital Signs BP 138/83 (BP Location: Right Arm)   Pulse 95   Temp 98.3 F (36.8 C) (Oral)   Resp 18   Wt 210 lb (95.3 kg)   LMP 01/13/2016 (Exact Date)   SpO2 96%   BMI 29.29 kg/m    Physical Exam Vitals and nursing note reviewed.  Constitutional:      General: She is not in acute distress.    Appearance: Normal appearance.  HENT:     Head: Atraumatic. No masses or laceration.     Jaw: There is normal jaw occlusion.     Comments: No deformity, swelling, bruising, or laceration     Mouth/Throat:     Mouth: Mucous membranes are moist.     Pharynx: Oropharynx is clear. No posterior oropharyngeal erythema.  Eyes:     Conjunctiva/sclera: Conjunctivae normal.     Pupils: Pupils are equal, round, and reactive to light.  Cardiovascular:     Rate and Rhythm: Normal rate and regular rhythm.     Pulses: Normal pulses.     Heart sounds: Normal heart sounds.  Pulmonary:     Effort: Pulmonary effort is normal. No respiratory distress.     Breath sounds: Normal breath sounds. No wheezing.  Abdominal:     General: Bowel sounds are normal.     Tenderness: There is no abdominal tenderness.  Musculoskeletal:        General: Normal range of motion.     Cervical back: Normal range of motion.  Skin:    Findings: No rash.     Comments: There are a couple mosquito bites on the lower legs. There is one  small bump on the left forearm patient is worried about. She has some freckling of the inner arms that she worries were infection   Neurological:     General: No focal deficit present.     Mental Status: She is alert and oriented to person, place, and time.     Cranial Nerves: Cranial nerves 2-12 are intact. No cranial nerve deficit.     Sensory: Sensation is intact.     Motor: Motor function is intact.     UC Treatments / Results  Labs (all labs ordered are listed, but only abnormal results are displayed) Labs Reviewed - No data to display  EKG  Radiology No results found.  Procedures Procedures   Medications Ordered in UC Medications - No data to display  Initial Impression / Assessment and Plan / UC Course  I have reviewed the triage vital signs and the nursing notes.  Pertinent labs & imaging results that were available during my care of the patient were reviewed by me and considered in my medical decision making (see chart for details).  Patient with concerns for rash that is not visible to this provider.  Reassurance was provided that some of the areas are just freckles and mosquito bites.  She does  have a dermatology appointment on 5/10, I recommend she discuss with them about the areas of concern.  For the itching I recommend antihistamine regimen, daily Zyrtec and nightly Pepcid. No red flags today.  No sign of injury of the head.  Reassuring that swelling went away on its own.  Neuro exam intact.  Can use ice, ibuprofen or Tylenol for any residual pain or swelling.  Can return as needed  Final Clinical Impressions(s) / UC Diagnoses   Final diagnoses:  Pruritus     Discharge Instructions      I would not worry about the skin changes at this time. Everything looks good! You can always ask your dermatologist if you are still concerned.  For itching I recommend trying an antihistamine This will be once daily zyrtec and once nightly pepcid. Use for the next week  or so I also recommend to change back to your regular laundry detergent    ED Prescriptions     Medication Sig Dispense Auth. Provider   cetirizine (ZYRTEC ALLERGY) 10 MG tablet Take 1 tablet (10 mg total) by mouth daily. 30 tablet Jaquane Boughner, PA-C   famotidine (PEPCID) 20 MG tablet Take 1 tablet (20 mg total) by mouth at bedtime. 30 tablet Greenlee Ancheta, Lurena Joiner, PA-C      PDMP not reviewed this encounter.   Kathrine Haddock 08/04/22 0454

## 2022-08-04 NOTE — ED Triage Notes (Signed)
Pt states that she hit her face. Hit face last sat. Left side. Hit face getting in car.  Also breaking out in hives. Unsure if its laundry detergent.

## 2022-08-04 NOTE — Discharge Instructions (Addendum)
I would not worry about the skin changes at this time. Everything looks good! You can always ask your dermatologist if you are still concerned.  For itching I recommend trying an antihistamine This will be once daily zyrtec and once nightly pepcid. Use for the next week or so I also recommend to change back to your regular laundry detergent

## 2022-08-08 IMAGING — MG MM DIGITAL SCREENING BILAT W/ TOMO AND CAD
8 series · 8 of 24 positions shown · non-contrast
Comparison: Previous exam(s).

CLINICAL DATA: Screening.

EXAM:
DIGITAL SCREENING BILATERAL MAMMOGRAM WITH TOMOSYNTHESIS AND CAD
TECHNIQUE: Bilateral screening digital craniocaudal and mediolateral oblique
mammograms were obtained. Bilateral screening digital breast
tomosynthesis was performed. The images were evaluated with
computer-aided detection.

[L MLO synth-2D]
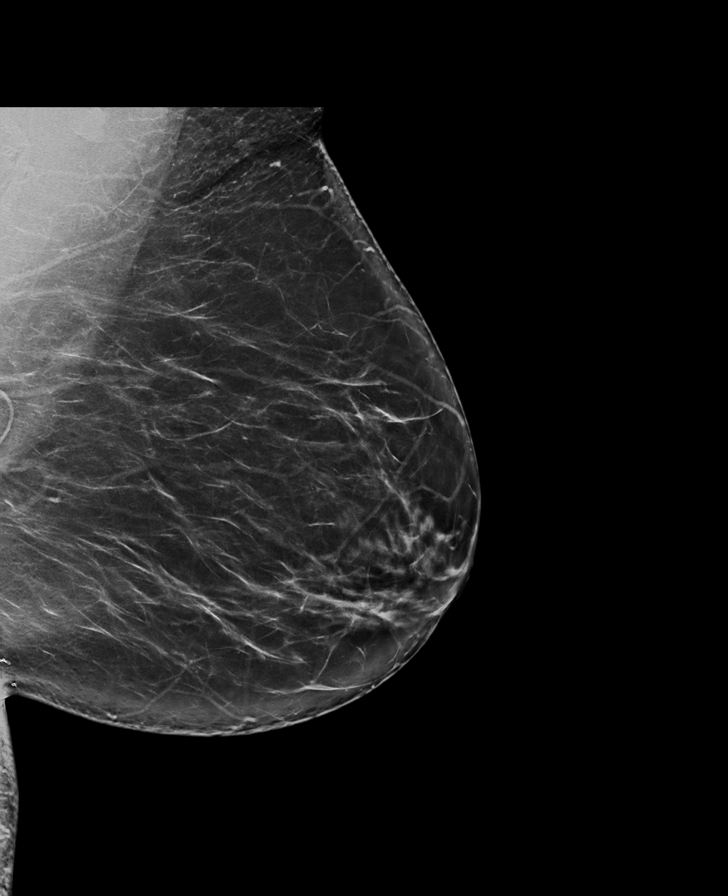

[R CC synth-2D]
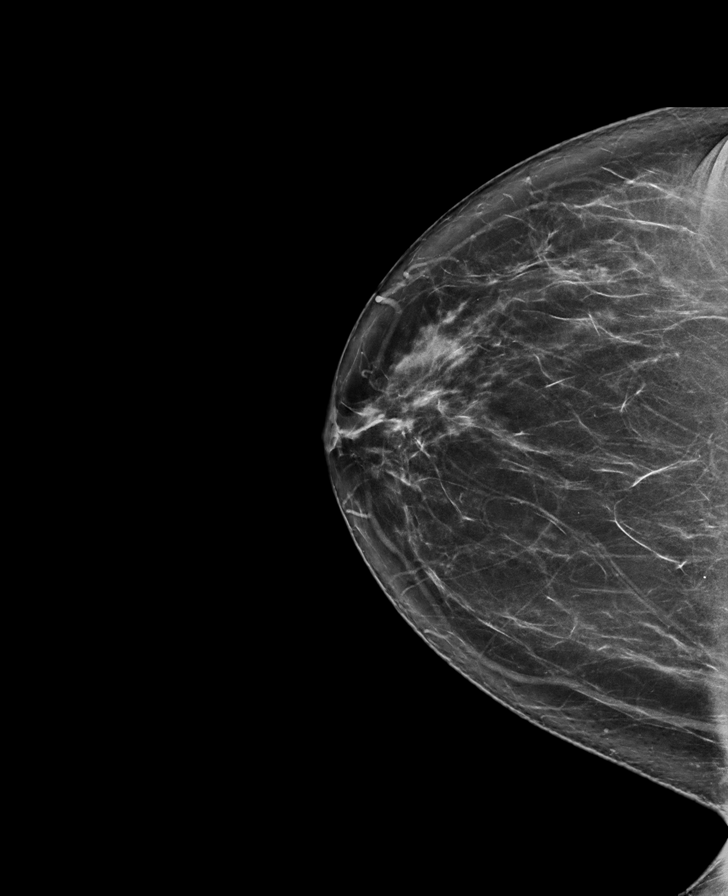

[R MLO synth-2D]
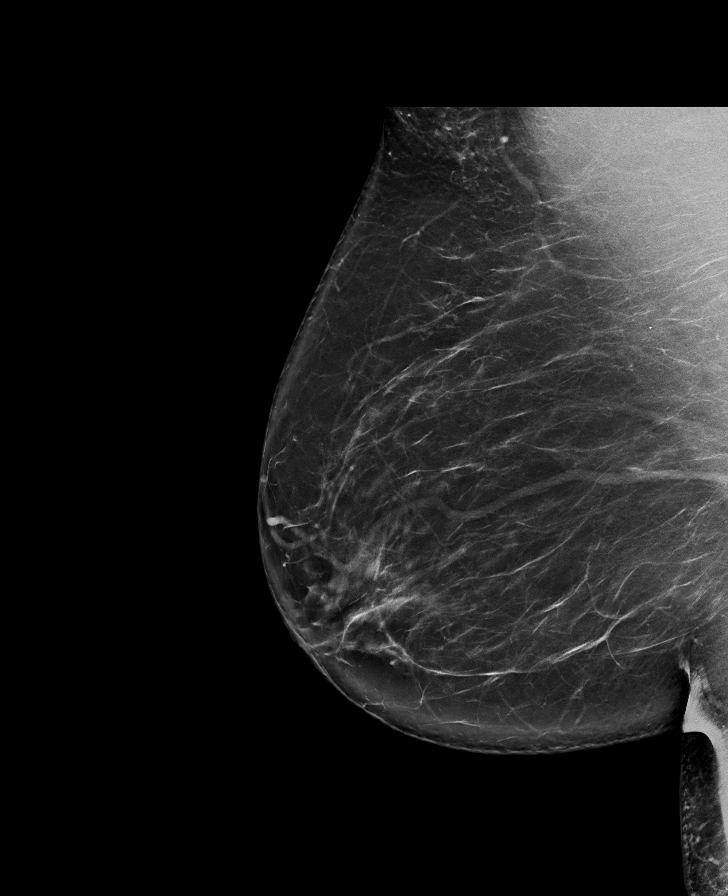

[L CC synth-2D]
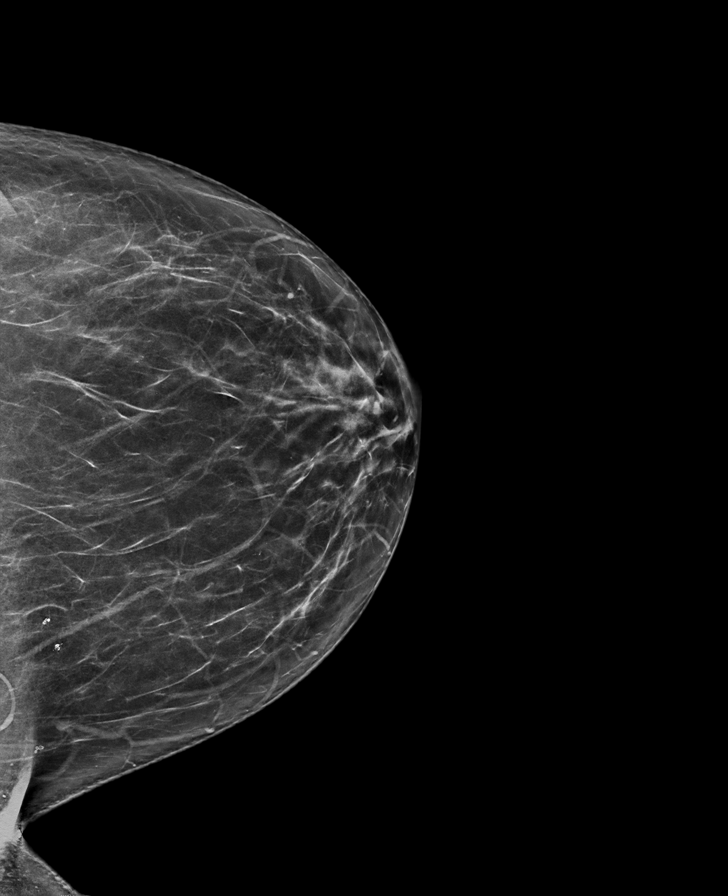

[R MLO tomo · tomo slice 49/97.0]
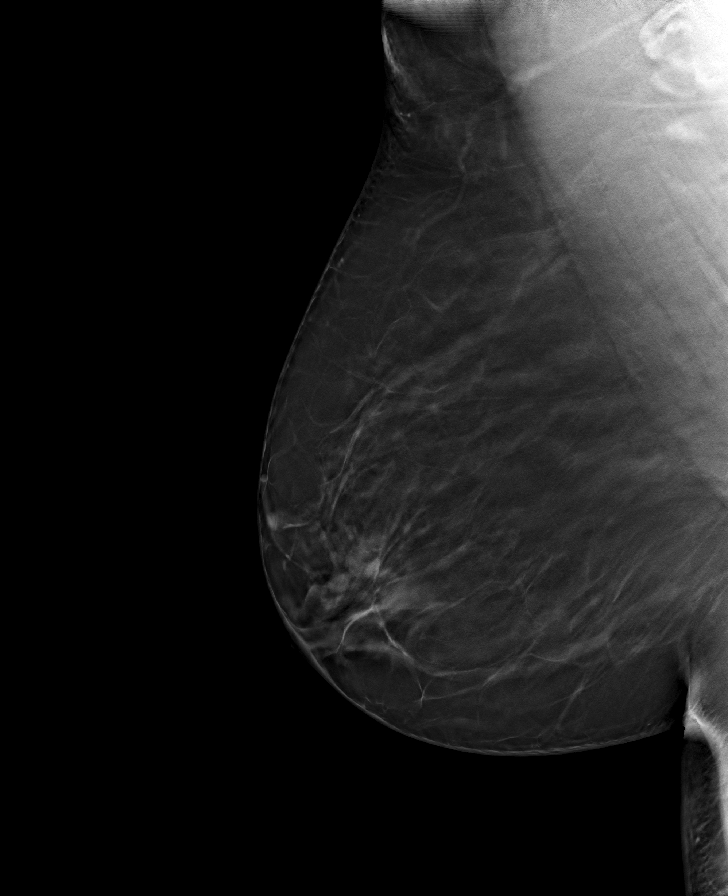

[L CC tomo · tomo slice 37/74.0]
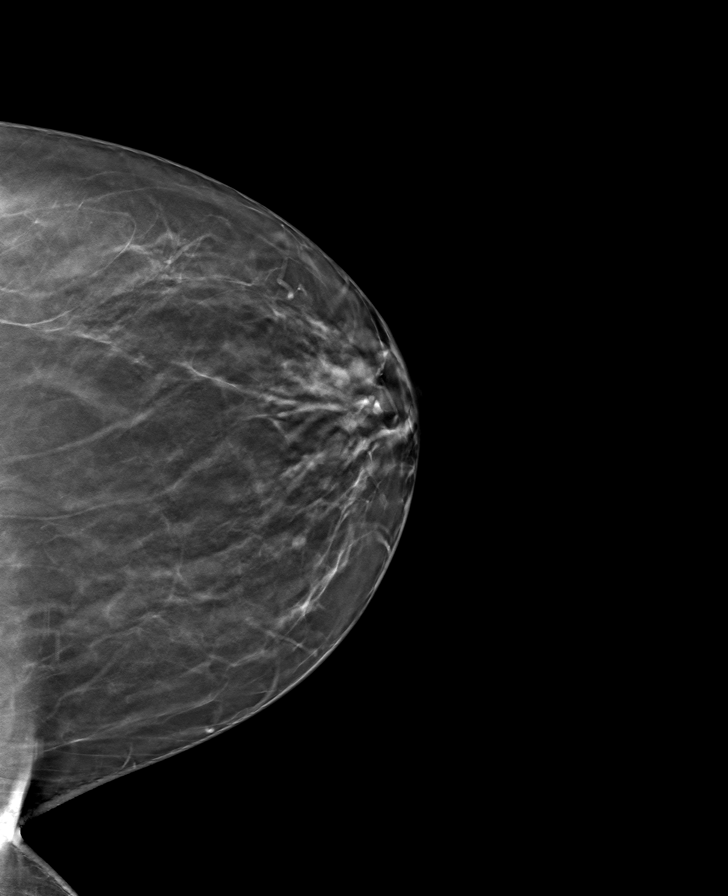

[R CC tomo · tomo slice 41/81.0]
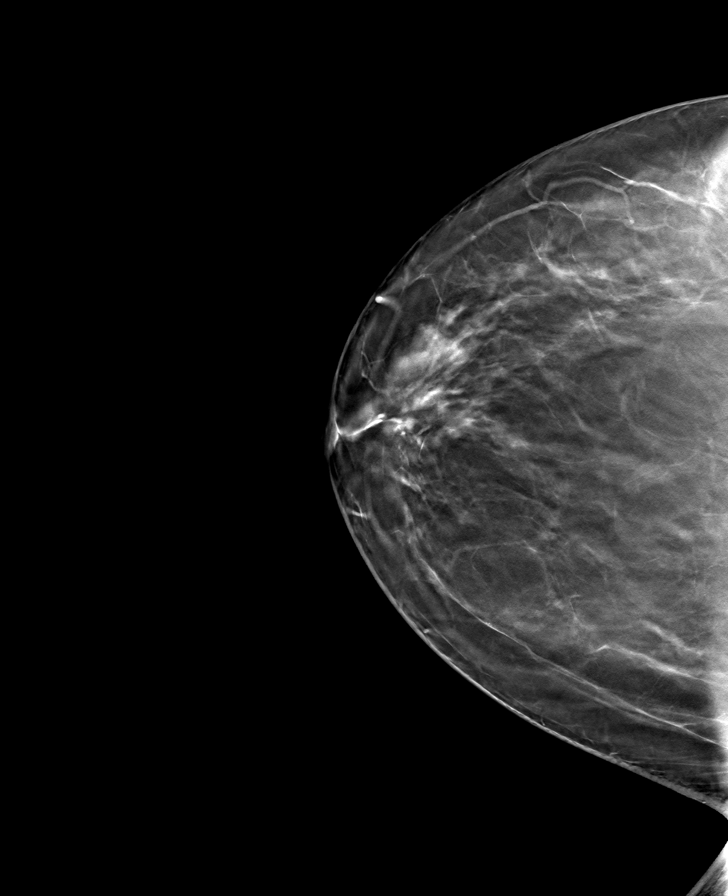

[L MLO tomo · tomo slice 46/91.0]
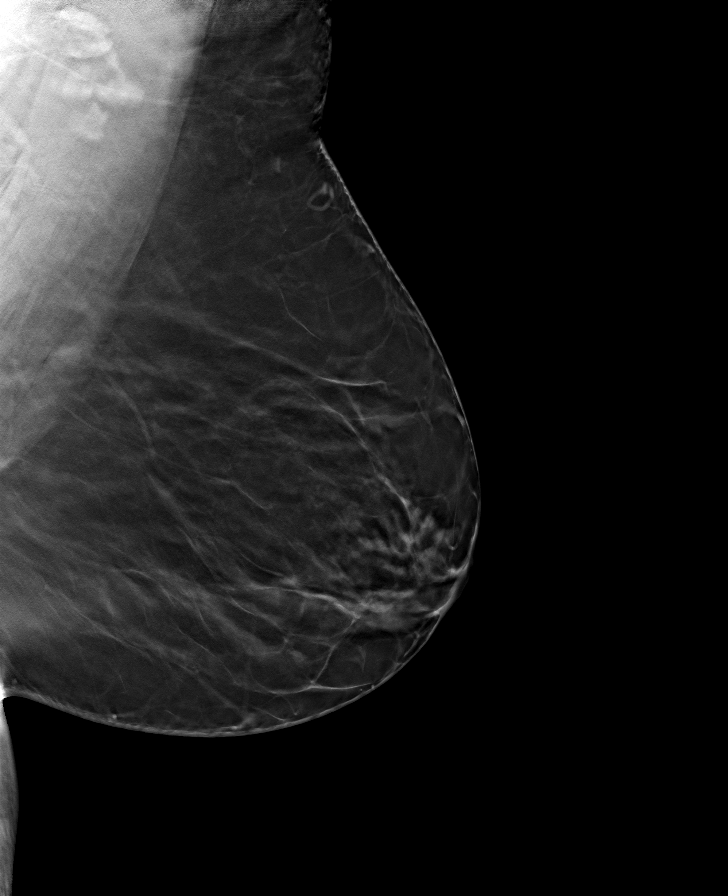

[8 of 24 positions shown; findings below may reference images not displayed]

ACR Breast Density Category b: There are scattered areas of
fibroglandular density.
FINDINGS: There are no findings suspicious for malignancy.
IMPRESSION: No mammographic evidence of malignancy. A result letter of this
screening mammogram will be mailed directly to the patient.

RECOMMENDATION:
Screening mammogram in one year. (Code:51-O-LD2)

BI-RADS CATEGORY  1: Negative.

## 2022-08-14 ENCOUNTER — Ambulatory Visit (HOSPITAL_COMMUNITY): Payer: Medicaid Other

## 2022-08-14 ENCOUNTER — Other Ambulatory Visit: Payer: Self-pay | Admitting: Psychiatry

## 2022-08-15 ENCOUNTER — Encounter (HOSPITAL_COMMUNITY): Payer: Self-pay

## 2022-08-15 ENCOUNTER — Ambulatory Visit (HOSPITAL_COMMUNITY)
Admission: RE | Admit: 2022-08-15 | Discharge: 2022-08-15 | Disposition: A | Discharge status: Home / Self Care | Payer: Medicaid Other | Source: Ambulatory Visit | Attending: Physician Assistant | Admitting: Physician Assistant

## 2022-08-15 VITALS — BP 135/88 | HR 95 | Temp 98.8°F | Resp 18

## 2022-08-15 DIAGNOSIS — R21 Rash and other nonspecific skin eruption: Secondary | ICD-10-CM

## 2022-08-15 MED ORDER — TRIAMCINOLONE ACETONIDE 0.1 % EX CREA: 1.0000 | TOPICAL_CREAM | Freq: Two times a day (BID) | CUTANEOUS | 0 refills | Status: DC

## 2022-08-15 NOTE — ED Triage Notes (Signed)
Pt c/o a sensitive burning rash to her neck for a week and a half. Denies any new products. States put hydrocortisone cream with no relief.

## 2022-08-15 NOTE — Discharge Instructions (Addendum)
At this time as I suspect that your skin concerns are likely due to exposure to an irritant, Tide detergent Please stop using this since you have a history of such a reaction.  I recommend using a dye free, fragrance free detergent for your clothing I also recommend continuing to use your Zyrtec to assist with further allergic symptoms Your hydroxyzine will also help with itching and allergic reactions-please take this as needed and directed I have sent in Kenalog cream for you to use as needed to assist with the itching.  You can use this up to twice a day but please do not use for longer than 2 weeks in any 1 area as this can cause skin thinning To assist with hydration and prevent further discomfort you can use Aquaphor or Eucerin cream Please follow-up as needed for persistent or worsening symptoms

## 2022-08-15 NOTE — ED Provider Notes (Signed)
[facility]    CSN: [id number] Arrival date & time: 08/15/22  1752      History   Chief Complaint Chief Complaint  Patient presents with   Rash    HPI Christine Vega is a 52 y.o. female.   HPI  RASH Duration:   1.5 weeks    Location:  anterior neck and small outbreaks along arms    Itching: yes Burning: yes Redness: yes Oozing: no Scaling: no Blisters: no Painful: no Fevers: no Change in detergents/soaps/personal care products: yes she reports recently switching to Tide detergent and has a history of breakouts with this  Recent illness: no Recent travel:no History of same: yes- reports history of Tide causing outbreaks and itching  Context: stable Alleviating factors: hydrocortisone cream Treatments attempted:hydrocortisone cream Shortness of breath: no  Throat/tongue swelling: no Myalgias/arthralgias: no She reports taking Pepcid and Zyrtec as recommended on 08/04/22 at previous UC visit    She also has concerns for face pain She reports hitting her face on the side of an SUV door about 2 weeks ago - review of chart shows she was seen in the ED for this on 06/21/22 She denies LOC when she hit her head  She reports some swelling on the left side of her face since the incident but this seems to be intermittent  She denies headaches, dizziness, lightheadedness  She reports a recent vision exam demonstrated normal results since the accident     Past Medical History:  Diagnosis Date   Anemia    history    Anxiety    Back pain    rotates to left hip and tailbone   Depression    DUB (dysfunctional uterine bleeding) 12/01/2010   Endometriosis    Fractured coccyx (HCC)    Heart murmur    no problems   History of seasonal allergies    Hyperlipidemia    Migraine    Migraines    tx w/depakote/verapamil per pt   Murmur, heart    Neuromuscular disorder (HCC)    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, walks/balance ok   Post traumatic stress disorder (PTSD)     Seizures (HCC) 2004   x 1; unknown source   Uterine leiomyoma     Patient Active Problem List   Diagnosis Date Noted   Nasal turbinate hypertrophy 05/14/2017   Iron deficiency anemia due to chronic blood loss 04/05/2016   Cervical high risk HPV (human papillomavirus) test positive, normal cytology on 02/24/14 03/01/2014   Atypical migraine 06/24/2013   Migraine without aura 06/24/2013   Vaginal pain 12/01/2010    Past Surgical History:  Procedure Laterality Date   CHOLECYSTECTOMY     HERNIA REPAIR     HYSTERECTOMY ABDOMINAL WITH SALPINGECTOMY Left 02/07/2016   Procedure: HYSTERECTOMY ABDOMINAL WITH  left SALPINGECTOMY;  Surgeon: Catalina Antigua, MD;  Location: [facility];  Service: Gynecology;  Laterality: Left;   LAPAROSCOPY  05/16/2011   Procedure: LAPAROSCOPY OPERATIVE;  Surgeon: Scheryl Darter, MD;  Location: [facility];  Service: Gynecology;  Laterality: N/A;  poss oophorectomy   LAPAROSCOPY  08/06/2011   Procedure: LAPAROSCOPY OPERATIVE;  Surgeon: Adam Phenix, MD;  Location: [facility];  Service: Gynecology;  Laterality: N/A;   LAPAROTOMY     for endometriosis/adhesions   SALPINGOOPHORECTOMY  08/06/2011   Procedure: SALPINGO OOPHERECTOMY;  Surgeon: Adam Phenix, MD;  Location: [facility];  Service: Gynecology;  Laterality: Right;   SVD     x 2   TUBAL LIGATION  WISDOM TOOTH EXTRACTION      OB History     Gravida  2   Para  2   Term  2   Preterm  0   AB  0   Living  2      SAB  0   IAB  0   Ectopic  0   Multiple  0   Live Births  2            Home Medications    Prior to Admission medications   Medication Sig Start Date End Date Taking? Authorizing Provider  triamcinolone cream (KENALOG) 0.1 % Apply 1 Application topically 2 (two) times daily. Do not use on the face. Do not use for more than 2 weeks in any one area. 08/15/22  Yes Mael Delap E, PA-C  buPROPion (WELLBUTRIN XL) 150 MG 24 hr tablet Take 150 mg by mouth daily.    [provider]   cetirizine (ZYRTEC ALLERGY) 10 MG tablet Take 1 tablet (10 mg total) by mouth daily. 08/04/22   Rising, Lurena Joiner, PA-C  famotidine (PEPCID) 20 MG tablet Take 1 tablet (20 mg total) by mouth at bedtime. 08/04/22   Rising, Rebecca, PA-C  fluticasone (FLONASE) 50 MCG/ACT nasal spray SHAKE LIQUID AND USE 2 SPRAYS IN EACH NOSTRIL DAILY 10/14/18   [provider]  Fremanezumab-vfrm ([name]) 225 MG/1.5ML SOAJ Inject 225 mg into the skin every 30 (thirty) days. 03/27/22   Ocie Doyne, MD  gabapentin (NEURONTIN) 300 MG capsule Take 300 mg by mouth 3 (three) times daily.    [provider]  HYDROcodone-acetaminophen (NORCO) 10-325 MG tablet Take 1 tablet by mouth every 6 (six) hours as needed for moderate pain.    [provider]  hydrOXYzine (ATARAX/VISTARIL) 50 MG tablet Take 50 mg by mouth 3 (three) times daily as needed. 11/09/19   [provider]  lamoTRIgine (LAMICTAL) 100 MG tablet Take 100 mg by mouth daily. 11/09/19   [provider]  Lasmiditan Succinate (REYVOW) 100 MG TABS Take 100 mg by mouth as needed (for migraine). 03/27/22   Ocie Doyne, MD  methocarbamol (ROBAXIN) 500 MG tablet Take 1 tablet (500 mg total) by mouth 2 (two) times daily. 06/23/22   Garlon Hatchet, PA-C  omeprazole (PRILOSEC) 40 MG capsule Take 40 mg by mouth daily. 11/09/19   [provider]  prochlorperazine (COMPAZINE) 10 MG tablet Take 10 mg by mouth every 6 (six) hours as needed for nausea or vomiting.    [provider]  promethazine (PHENERGAN) 50 MG suppository Place 50 mg rectally every 8 (eight) hours as needed for nausea or vomiting.    [provider]  propranolol ER (INDERAL LA) 120 MG 24 hr capsule Take 120 mg by mouth daily.    [provider]  REYVOW 100 MG TABS TAKE 1 TABLET BY MOUTH DAILY AS NEEDED 08/11/21   Everlena Cooper, Adam R, DO  sertraline (ZOLOFT) 100 MG tablet Take 100 mg by mouth daily. 07/25/11   [provider]   simvastatin (ZOCOR) 40 MG tablet Take 40 mg by mouth daily. 08/15/19   [provider]  tiZANidine (ZANAFLEX) 4 MG tablet Take 4 mg by mouth every 12 (twelve) hours as needed for muscle spasms.     [provider]  Zavegepant HCl (ZAVZPRET) 10 MG/ACT SOLN Place 10 mg into the nose as needed (for migraine). Max dose 1 spray in 24 hours 03/27/22   Ocie Doyne, MD    Family History Family  History  Problem Relation Age of Onset   Migraines Mother    Heart disease Father    Migraines Father    Migraines Daughter    Breast cancer Neg Hx     Social History Social History   Tobacco Use   Smoking status: Never   Smokeless tobacco: Never  Vaping Use   Vaping Use: Never used  Substance Use Topics   Alcohol use: No    Alcohol/week: 0.0 standard drinks of alcohol   Drug use: No     Allergies   Penicillins, Lipitor [atorvastatin calcium], Mushroom extract complex, Spinach, and Zithromax [azithromycin]   Review of Systems Review of Systems  Skin:  Positive for rash.     Physical Exam Triage Vital Signs ED Triage Vitals  Enc Vitals Group     BP 08/15/22 1816 135/88     Pulse Rate 08/15/22 1816 95     Resp 08/15/22 1816 18     Temp 08/15/22 1816 98.8 F (37.1 C)     Temp Source 08/15/22 1816 Oral     SpO2 08/15/22 1816 98 %     Weight --      Height --      Head Circumference --      Peak Flow --      Pain Score 08/15/22 1817 0     Pain Loc --      Pain Edu? --      Excl. in GC? --    No data found.  Updated Vital Signs BP 135/88 (BP Location: Left Arm)   Pulse 95   Temp 98.8 F (37.1 C) (Oral)   Resp 18   LMP 01/13/2016 (Exact Date)   SpO2 98%   Visual Acuity Right Eye Distance:   Left Eye Distance:   Bilateral Distance:    Right Eye Near:   Left Eye Near:    Bilateral Near:     Physical Exam Vitals reviewed.  Constitutional:      General: She is awake.     Appearance: Normal appearance. She is well-developed and well-groomed.   HENT:     Head: Normocephalic and atraumatic. No raccoon eyes, Battle's sign, abrasion, contusion, masses, right periorbital erythema, left periorbital erythema or laceration.     Nose: No nasal deformity, nasal tenderness or mucosal edema.  Eyes:     General: Lids are normal. Gaze aligned appropriately.     Extraocular Movements: Extraocular movements intact.     Right eye: Normal extraocular motion and no nystagmus.     Left eye: Normal extraocular motion and no nystagmus.     Conjunctiva/sclera: Conjunctivae normal.     Pupils: Pupils are equal, round, and reactive to light.  Musculoskeletal:     Cervical back: Normal range of motion. Normal range of motion.  Skin:    General: Skin is warm and dry.     Findings: No rash.     Comments: Tiny, numerous (less than 1 mm) Milia- like lesions along forearms  No observable rash on neck at this time   Neurological:     General: No focal deficit present.     Mental Status: She is alert.     GCS: GCS eye subscore is 4. GCS verbal subscore is 5. GCS motor subscore is 6.     Cranial Nerves: Cranial nerves 2-12 are intact. No cranial nerve deficit, dysarthria or facial asymmetry.     Motor: Motor function is intact.  Psychiatric:  Attention and Perception: Attention normal.        Mood and Affect: Mood normal.        Speech: Speech normal.        Behavior: Behavior is cooperative.      UC Treatments / Results  Labs (all labs ordered are listed, but only abnormal results are displayed) Labs Reviewed - No data to display  EKG   Radiology No results found.  Procedures Procedures (including critical care time)  Medications Ordered in UC Medications - No data to display  Initial Impression / Assessment and Plan / UC Course  I have reviewed the triage vital signs and the nursing notes.  Pertinent labs & imaging results that were available during my care of the patient were reviewed by me and considered in my medical  decision making (see chart for details).     Rash and nonspecific skin eruption Acute, new concern Patient reports itching and perceived rash along anterior neck and forearms  physical exam does not reveal a very prevalent or noticeable rash in either area.  Arms may have slight milia or keratosis pilaris given widespread, non-inflamed nature of lesions Recommend she refrains from using known triggers or irritants and instead uses dye and fragrance free detergents, soaps, and lotions to avoid further irritation At this time recommend she continues to use Zyrtec daily and Hydroxyzine PRN for itching  Will provide Kenalog 0.1% cream to assist with itching- side effects and use instructions provided during apt and in AVS Reviewed return and ED precautions Follow up as needed for persistent or progressing symptoms   We also discussed patient's concerns for previous facial injury Reassured patient that face appears overall symmetrical.  Neurological exam was normal.  Reviewed her recent emergency room and urgent care visits from 06/21/2022 and 08/04/2022 Suspect that her perceived facial swelling is likely secondary to current seasonal allergies Recommend that she continues to use her Zyrtec as continued use of this will likely assist with allergic symptoms  Reviewed return precautions as well as ED recommendations.  Patient voiced understanding and agreement Follow-up as needed for persistent or progressing symptoms or concerns   Final Clinical Impressions(s) / UC Diagnoses   Final diagnoses:  Rash and nonspecific skin eruption     Discharge Instructions      At this time as I suspect that your skin concerns are likely due to exposure to an irritant, Tide detergent Please stop using this since you have a history of such a reaction.  I recommend using a dye free, fragrance free detergent for your clothing I also recommend continuing to use your Zyrtec to assist with further allergic  symptoms Your hydroxyzine will also help with itching and allergic reactions-please take this as needed and directed I have sent in Kenalog cream for you to use as needed to assist with the itching.  You can use this up to twice a day but please do not use for longer than 2 weeks in any 1 area as this can cause skin thinning To assist with hydration and prevent further discomfort you can use Aquaphor or Eucerin cream Please follow-up as needed for persistent or worsening symptoms     ED Prescriptions     Medication Sig Dispense Auth. Provider   triamcinolone cream (KENALOG) 0.1 % Apply 1 Application topically 2 (two) times daily. Do not use on the face. Do not use for more than 2 weeks in any one area. 15 g Gervis Gaba E, PA-C  PDMP not reviewed this encounter.   Providence Crosby, PA-C 08/15/22 1912

## 2022-08-15 NOTE — Telephone Encounter (Signed)
Last seen on 03/27/22 per note "[facility]: Start Zavzpret 10 mg PRN "  Last filled on 08/10/22

## 2022-08-23 ENCOUNTER — Ambulatory Visit (INDEPENDENT_AMBULATORY_CARE_PROVIDER_SITE_OTHER): Payer: Medicaid Other | Admitting: Advanced Practice Midwife

## 2022-08-23 ENCOUNTER — Encounter: Payer: Self-pay | Admitting: Advanced Practice Midwife

## 2022-08-23 ENCOUNTER — Other Ambulatory Visit (HOSPITAL_COMMUNITY)
Admission: RE | Admit: 2022-08-23 | Discharge: 2022-08-23 | Disposition: A | Discharge status: Home / Self Care | Payer: Medicaid Other | Source: Ambulatory Visit | Attending: Advanced Practice Midwife | Admitting: Advanced Practice Midwife

## 2022-08-23 ENCOUNTER — Ambulatory Visit: Payer: Medicaid Other | Admitting: Advanced Practice Midwife

## 2022-08-23 VITALS — BP 129/80 | HR 94 | Wt 195.0 lb

## 2022-08-23 DIAGNOSIS — Z01419 Encounter for gynecological examination (general) (routine) without abnormal findings: Secondary | ICD-10-CM | POA: Insufficient documentation

## 2022-08-23 DIAGNOSIS — R030 Elevated blood-pressure reading, without diagnosis of hypertension: Secondary | ICD-10-CM

## 2022-08-23 DIAGNOSIS — Z659 Problem related to unspecified psychosocial circumstances: Secondary | ICD-10-CM | POA: Diagnosis not present

## 2022-08-23 DIAGNOSIS — Z1339 Encounter for screening examination for other mental health and behavioral disorders: Secondary | ICD-10-CM

## 2022-08-23 NOTE — Progress Notes (Signed)
GYNECOLOGY ANNUAL PREVENTATIVE CARE ENCOUNTER NOTE  History:     Christine Vega is a 52 y.o. [id number] female here for a routine annual gynecologic exam.  Current complaints: patient without physical complaints. She reports recent social stress related to her daughter and patient's grandson moving in with daughter's partner. Patient states they are working on rebuilding their relationship and she is excited to be able to bond with her grandson again. She denies SI, IPV, HI. Denies abnormal vaginal bleeding, discharge, pelvic pain, problems with intercourse or other gynecologic concerns.  Patient is retired, abstinent x 3 year. Recently returned from a trip to [place].   Gynecologic History Patient's last menstrual period was 01/13/2016 (exact date). Contraception: status post hysterectomy Last Pap: 05/2021. Result was normal with negative HPV Last Mammogram: 06/2022.  Result was normal   Obstetric History OB History  Gravida Para Term Preterm AB Living  2 2 2  0 0 2  SAB IAB Ectopic Multiple Live Births  0 0 0 0 2    # Outcome Date GA Lbr Len/2nd Weight Sex Delivery Anes PTL Lv  2 Term      Vag-Spont   LIV  1 Term      Vag-Spont   LIV    Past Medical History:  Diagnosis Date   Anemia    history    Anxiety    Back pain    rotates to left hip and tailbone   Depression    DUB (dysfunctional uterine bleeding) 12/01/2010   Endometriosis    Fractured coccyx (HCC)    Heart murmur    no problems   History of seasonal allergies    Hyperlipidemia    Migraine    Migraines    tx w/depakote/verapamil per pt   Murmur, heart    Neuromuscular disorder (HCC)    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, walks/balance ok   Post traumatic stress disorder (PTSD)    Seizures (HCC) 2004   x 1; unknown source   Uterine leiomyoma     Past Surgical History:  Procedure Laterality Date   CHOLECYSTECTOMY     HERNIA REPAIR     HYSTERECTOMY ABDOMINAL WITH SALPINGECTOMY Left 02/07/2016   Procedure:  HYSTERECTOMY ABDOMINAL WITH  left SALPINGECTOMY;  Surgeon: Catalina Antigua, MD;  Location: [facility];  Service: Gynecology;  Laterality: Left;   LAPAROSCOPY  05/16/2011   Procedure: LAPAROSCOPY OPERATIVE;  Surgeon: Scheryl Darter, MD;  Location: [facility];  Service: Gynecology;  Laterality: N/A;  poss oophorectomy   LAPAROSCOPY  08/06/2011   Procedure: LAPAROSCOPY OPERATIVE;  Surgeon: Adam Phenix, MD;  Location: [facility];  Service: Gynecology;  Laterality: N/A;   LAPAROTOMY     for endometriosis/adhesions   SALPINGOOPHORECTOMY  08/06/2011   Procedure: SALPINGO OOPHERECTOMY;  Surgeon: Adam Phenix, MD;  Location: [facility];  Service: Gynecology;  Laterality: Right;   SVD     x 2   TUBAL LIGATION     WISDOM TOOTH EXTRACTION      Current Outpatient Medications on File Prior to Visit  Medication Sig Dispense Refill   buPROPion (WELLBUTRIN XL) 150 MG 24 hr tablet Take 150 mg by mouth daily.     cetirizine (ZYRTEC ALLERGY) 10 MG tablet Take 1 tablet (10 mg total) by mouth daily. 30 tablet 2   famotidine (PEPCID) 20 MG tablet Take 1 tablet (20 mg total) by mouth at bedtime. 30 tablet 2   fluticasone (FLONASE) 50 MCG/ACT nasal spray SHAKE LIQUID AND USE 2  SPRAYS IN EACH NOSTRIL DAILY     Fremanezumab-vfrm ([name]) 225 MG/1.5ML SOAJ Inject 225 mg into the skin every 30 (thirty) days. 1.68 mL 6   gabapentin (NEURONTIN) 300 MG capsule Take 300 mg by mouth 3 (three) times daily.     HYDROcodone-acetaminophen (NORCO) 10-325 MG tablet Take 1 tablet by mouth every 6 (six) hours as needed for moderate pain.     hydrOXYzine (ATARAX/VISTARIL) 50 MG tablet Take 50 mg by mouth 3 (three) times daily as needed.     lamoTRIgine (LAMICTAL) 100 MG tablet Take 100 mg by mouth daily.     Lasmiditan Succinate (REYVOW) 100 MG TABS Take 100 mg by mouth as needed (for migraine). 15 tablet 5   methocarbamol (ROBAXIN) 500 MG tablet Take 1 tablet (500 mg total) by mouth 2 (two) times daily. 20 tablet 0   omeprazole (PRILOSEC) 40 MG  capsule Take 40 mg by mouth daily.     prochlorperazine (COMPAZINE) 10 MG tablet Take 10 mg by mouth every 6 (six) hours as needed for nausea or vomiting.     promethazine (PHENERGAN) 50 MG suppository Place 50 mg rectally every 8 (eight) hours as needed for nausea or vomiting.     propranolol ER (INDERAL LA) 120 MG 24 hr capsule Take 120 mg by mouth daily.     REYVOW 100 MG TABS TAKE 1 TABLET BY MOUTH DAILY AS NEEDED 8 tablet 0   sertraline (ZOLOFT) 100 MG tablet Take 100 mg by mouth daily.     simvastatin (ZOCOR) 40 MG tablet Take 40 mg by mouth daily.     tiZANidine (ZANAFLEX) 4 MG tablet Take 4 mg by mouth every 12 (twelve) hours as needed for muscle spasms.      triamcinolone cream (KENALOG) 0.1 % Apply 1 Application topically 2 (two) times daily. Do not use on the face. Do not use for more than 2 weeks in any one area. 15 g 0   Zavegepant HCl (ZAVZPRET) 10 MG/ACT SOLN Place 10 mg into the nose as needed (for migraine). Max dose 1 spray in 24 hours 6 each 6   No current facility-administered medications on file prior to visit.    Allergies  Allergen Reactions   Penicillins Hives, Itching and Other (See Comments)    Has patient had a PCN reaction causing immediate rash, facial/tongue/throat swelling, SOB or lightheadedness with hypotension: No Has patient had a PCN reaction causing severe rash involving mucus membranes or skin necrosis: No Has patient had a PCN reaction that required hospitalization: No Has patient had a PCN reaction occurring within the last 10 years: Yes If all of the above answers are "NO", then may proceed with Cephalosporin use.    Lipitor [Atorvastatin Calcium]    Mushroom Extract Complex Other (See Comments)   Spinach Other (See Comments)   Zithromax [Azithromycin] Hives and Itching    Social History:  reports that she has never smoked. She has never used smokeless tobacco. She reports that she does not drink alcohol and does not use drugs.  Family History   Problem Relation Age of Onset   Migraines Mother    Heart disease Father    Migraines Father    Migraines Daughter    Breast cancer Neg Hx     The following portions of the patient's history were reviewed and updated as appropriate: allergies, current medications, past family history, past medical history, past social history, past surgical history and problem list.  Review of Systems Pertinent items noted  in HPI and remainder of comprehensive ROS otherwise negative.  Physical Exam:  BP 129/80   Pulse 94   Wt 195 lb (88.5 kg)   LMP 01/13/2016 (Exact Date)   BMI 27.20 kg/m  CONSTITUTIONAL: Well-developed, well-nourished female in no acute distress.  HENT:  Normocephalic, atraumatic, External right and left ear normal.  EYES: Conjunctivae and EOM are normal. Pupils are equal, round, and reactive to light. No scleral icterus.  NECK: Normal range of motion, supple, no masses.  Normal thyroid.  SKIN: Skin is warm and dry. No rash noted. Not diaphoretic. No erythema. No pallor. MUSCULOSKELETAL: Normal range of motion. No tenderness.  No cyanosis, clubbing, or edema. NEUROLOGIC: Alert and oriented to person, place, and time. Normal reflexes, muscle tone coordination.  PSYCHIATRIC: Normal mood and affect. Normal behavior. Normal judgment and thought content. CARDIOVASCULAR: Normal heart rate noted, regular rhythm RESPIRATORY: Clear to auscultation bilaterally. Effort and breath sounds normal, no problems with respiration noted. BREASTS: Symmetric in size. No masses, tenderness, skin changes, nipple drainage, or lymphadenopathy bilaterally. Performed in the presence of a chaperone. ABDOMEN: Soft, no distention noted.  No tenderness, rebound or guarding.  PELVIC: Normal appearing external genitalia and urethral meatus; normal appearing vaginal mucosa and cervix. Atrophic tissue appropriate for age. No abnormal vaginal discharge noted.  Pap smear obtained.   Performed in the presence of a  chaperone.   Assessment and Plan:    1. Well woman exam with routine gynecological exam - Doing so well, flawless in her 70s - Cytology - PAP per patient preference  2. Other social stressor - Abnormal depression screening today, voices feeling more positive - Spoke with daughter and grandson during today's visit  3. Elevated blood pressure reading - Normotensive on recheck - Not on meds, typically elevated with stress otherwise normotensive per patient  Will follow up results of pap smear and manage accordingly. Mammogram recently accomplished Routine preventative health maintenance measures emphasized. Please refer to After Visit Summary for other counseling recommendations.      Clayton Bibles, MSA, MSN, CNM Certified Nurse Midwife, Biochemist, clinical for [employer], [facility]

## 2022-08-23 NOTE — Progress Notes (Signed)
Patient presents for Annual.  LMP: Hysterectomy Last pap: Date: 06/08/21 Contraception:  Hysterectomy Mammogram: Up to date: 05/25/22 STD Screening: Accepts Flu Vaccine : N/A  CC:  Annual

## 2022-08-24 ENCOUNTER — Ambulatory Visit (HOSPITAL_COMMUNITY): Payer: Medicaid Other

## 2022-08-24 LAB — CYTOLOGY - PAP
Chlamydia: NEGATIVE
Comment: NEGATIVE
Comment: NEGATIVE
Comment: NEGATIVE
Comment: NORMAL
Diagnosis: NEGATIVE
High risk HPV: NEGATIVE
Neisseria Gonorrhea: NEGATIVE
Trichomonas: NEGATIVE

## 2022-08-29 ENCOUNTER — Telehealth: Payer: Self-pay

## 2022-08-29 NOTE — Telephone Encounter (Signed)
Pt is advised that her Pap results are normal per [employer]. Pt verbalized understanding

## 2022-09-02 ENCOUNTER — Ambulatory Visit (HOSPITAL_COMMUNITY)
Admission: EM | Admit: 2022-09-02 | Discharge: 2022-09-02 | Disposition: A | Discharge status: Home / Self Care | Payer: Medicaid Other | Attending: Urgent Care | Admitting: Urgent Care

## 2022-09-02 ENCOUNTER — Encounter (HOSPITAL_COMMUNITY): Payer: Self-pay

## 2022-09-02 DIAGNOSIS — L301 Dyshidrosis [pompholyx]: Secondary | ICD-10-CM

## 2022-09-02 DIAGNOSIS — R21 Rash and other nonspecific skin eruption: Secondary | ICD-10-CM

## 2022-09-02 MED ORDER — HYDROXYZINE HCL 25 MG PO TABS: 12.5000 mg | ORAL_TABLET | Freq: Three times a day (TID) | ORAL | 0 refills | Status: DC | PRN

## 2022-09-02 MED ORDER — TRIAMCINOLONE ACETONIDE 0.1 % EX CREA: 1.0000 | TOPICAL_CREAM | Freq: Two times a day (BID) | CUTANEOUS | 0 refills | Status: DC

## 2022-09-02 NOTE — ED Provider Notes (Signed)
[facility]   MRN: [id number] DOB: 1970-11-12  Subjective:   Christine Vega is a 52 y.o. female presenting for 1 week history of itchy and irritating rash to the backs of her hands.  Symptoms started after she started using a new soap.  Has primarily been using it to her hands.  However she has some irritated spots along the left side of her cheek and in the nasolabial space.  No specific rashes been noted there.  Has not used the soap anywhere else on her body.  No current facility-administered medications for this encounter.  Current Outpatient Medications:    buPROPion (WELLBUTRIN XL) 150 MG 24 hr tablet, Take 150 mg by mouth daily., Disp: , Rfl:    cetirizine (ZYRTEC ALLERGY) 10 MG tablet, Take 1 tablet (10 mg total) by mouth daily., Disp: 30 tablet, Rfl: 2   famotidine (PEPCID) 20 MG tablet, Take 1 tablet (20 mg total) by mouth at bedtime., Disp: 30 tablet, Rfl: 2   fluticasone (FLONASE) 50 MCG/ACT nasal spray, SHAKE LIQUID AND USE 2 SPRAYS IN EACH NOSTRIL DAILY, Disp: , Rfl:    Fremanezumab-vfrm ([name]) 225 MG/1.5ML SOAJ, Inject 225 mg into the skin every 30 (thirty) days., Disp: 1.68 mL, Rfl: 6   gabapentin (NEURONTIN) 300 MG capsule, Take 300 mg by mouth 3 (three) times daily., Disp: , Rfl:    HYDROcodone-acetaminophen (NORCO) 10-325 MG tablet, Take 1 tablet by mouth every 6 (six) hours as needed for moderate pain., Disp: , Rfl:    hydrOXYzine (ATARAX/VISTARIL) 50 MG tablet, Take 50 mg by mouth 3 (three) times daily as needed., Disp: , Rfl:    lamoTRIgine (LAMICTAL) 100 MG tablet, Take 100 mg by mouth daily., Disp: , Rfl:    Lasmiditan Succinate (REYVOW) 100 MG TABS, Take 100 mg by mouth as needed (for migraine)., Disp: 15 tablet, Rfl: 5   methocarbamol (ROBAXIN) 500 MG tablet, Take 1 tablet (500 mg total) by mouth 2 (two) times daily., Disp: 20 tablet, Rfl: 0   omeprazole (PRILOSEC) 40 MG capsule, Take 40 mg by mouth daily., Disp: , Rfl:    prochlorperazine  (COMPAZINE) 10 MG tablet, Take 10 mg by mouth every 6 (six) hours as needed for nausea or vomiting., Disp: , Rfl:    promethazine (PHENERGAN) 50 MG suppository, Place 50 mg rectally every 8 (eight) hours as needed for nausea or vomiting., Disp: , Rfl:    propranolol ER (INDERAL LA) 120 MG 24 hr capsule, Take 120 mg by mouth daily., Disp: , Rfl:    REYVOW 100 MG TABS, TAKE 1 TABLET BY MOUTH DAILY AS NEEDED, Disp: 8 tablet, Rfl: 0   sertraline (ZOLOFT) 100 MG tablet, Take 100 mg by mouth daily., Disp: , Rfl:    simvastatin (ZOCOR) 40 MG tablet, Take 40 mg by mouth daily., Disp: , Rfl:    tiZANidine (ZANAFLEX) 4 MG tablet, Take 4 mg by mouth every 12 (twelve) hours as needed for muscle spasms. , Disp: , Rfl:    triamcinolone cream (KENALOG) 0.1 %, Apply 1 Application topically 2 (two) times daily. Do not use on the face. Do not use for more than 2 weeks in any one area., Disp: 15 g, Rfl: 0   Zavegepant HCl (ZAVZPRET) 10 MG/ACT SOLN, Place 10 mg into the nose as needed (for migraine). Max dose 1 spray in 24 hours, Disp: 6 each, Rfl: 6   Allergies  Allergen Reactions   Penicillins Hives, Itching and Other (See Comments)  Has patient had a PCN reaction causing immediate rash, facial/tongue/throat swelling, SOB or lightheadedness with hypotension: No Has patient had a PCN reaction causing severe rash involving mucus membranes or skin necrosis: No Has patient had a PCN reaction that required hospitalization: No Has patient had a PCN reaction occurring within the last 10 years: Yes If all of the above answers are "NO", then may proceed with Cephalosporin use.    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, walks/balance ok   Post traumatic stress disorder (PTSD)    Seizures (HCC) 2004   x 1; unknown source   Uterine leiomyoma      Past Surgical History:  Procedure Laterality Date   CHOLECYSTECTOMY     HERNIA REPAIR     HYSTERECTOMY ABDOMINAL WITH SALPINGECTOMY Left 02/07/2016   Procedure: HYSTERECTOMY ABDOMINAL WITH  left SALPINGECTOMY;  Surgeon: Catalina Antigua, MD;  Location: [facility];  Service: Gynecology;  Laterality: Left;   LAPAROSCOPY  05/16/2011   Procedure: LAPAROSCOPY OPERATIVE;  Surgeon: Scheryl Darter, MD;  Location: [facility];  Service: Gynecology;  Laterality: N/A;  poss oophorectomy   LAPAROSCOPY  08/06/2011   Procedure: LAPAROSCOPY OPERATIVE;  Surgeon: Adam Phenix, MD;  Location: [facility];  Service: Gynecology;  Laterality: N/A;   LAPAROTOMY     for endometriosis/adhesions   SALPINGOOPHORECTOMY  08/06/2011   Procedure: SALPINGO OOPHERECTOMY;  Surgeon: Adam Phenix, MD;  Location: [facility];  Service: Gynecology;  Laterality: Right;   SVD     x 2   TUBAL LIGATION     WISDOM TOOTH EXTRACTION      Family History  Problem Relation Age of Onset   Migraines Mother    Heart disease Father    Migraines Father    Migraines Daughter    Breast cancer Neg Hx     Social History   Tobacco Use   Smoking status: Never   Smokeless tobacco: Never  Vaping Use   Vaping Use: Never used  Substance Use Topics   Alcohol use: No    Alcohol/week: 0.0 standard drinks of alcohol   Drug use: No    ROS   Objective:   Vitals: BP 110/75 (BP Location: Left Arm)   Pulse (!) 101   Temp 98.1 F (36.7 C) (Oral)   Resp 16   Ht 5' 11.5" (1.816 m)   Wt 195 lb (88.5 kg)   LMP 01/13/2016 (Exact Date)   SpO2 96%    BMI 26.82 kg/m   Physical Exam Constitutional:      General: She is not in acute distress.    Appearance: Normal appearance. She is well-developed. She is not ill-appearing, toxic-appearing or diaphoretic.  HENT:     Head: Normocephalic and atraumatic.     Comments: No rash  over the nasolabial space or cheek.    Right Ear: External ear normal.     Left Ear: External ear normal.     Nose: Nose normal.     Mouth/Throat:     Mouth: Mucous membranes are moist.  Eyes:     General: No scleral icterus.       Right eye: No discharge.        Left eye: No discharge.     Extraocular Movements: Extraocular movements intact.  Cardiovascular:     Rate and Rhythm: Normal rate.  Pulmonary:     Effort: Pulmonary effort is normal.  Skin:    General: Skin is warm and dry.     Findings: Rash (dry scaly patches overlying the dorsal aspects of the hands bilaterally) present.  Neurological:     General: No focal deficit present.     Mental Status: She is alert and oriented to person, place, and time.  Psychiatric:        Mood and Affect: Mood normal.        Behavior: Behavior normal.     Assessment and Plan :   PDMP not reviewed this encounter.  1. Dyshidrotic eczema   2. Rash and nonspecific skin eruption    Suspected dyshidrotic eczema versus localized reaction.  Favor dyshidrotic eczema given that the palms of her hands are spared.  Recommended triamcinolone cream, hydroxyzine for itching.  Counseled patient on potential for adverse effects with medications prescribed/recommended today, ER and return-to-clinic precautions discussed, patient verbalized understanding.    Christine Vega, [place] 09/02/22 1829

## 2022-09-02 NOTE — ED Triage Notes (Signed)
Patient here today with c/o a reaction to her hands after using Suave soap 1 week ago. She has not been using the soap since. Rash is itching and swelling. She also has a little spot on the left cheek and under her nose.

## 2022-09-10 ENCOUNTER — Telehealth: Payer: Self-pay | Admitting: Pharmacy Technician

## 2022-09-10 NOTE — Telephone Encounter (Signed)
Pharmacy Patient Advocate Encounter  Received notification from [facility] that the request for prior authorization for [name] (fremanezumab-vfrm) injection 225MG /1.5ML auto-injectors has been denied due to see below.      Please be advised we currently do not have a Pharmacist to review denials, therefore you will need to process appeals accordingly as needed. Thanks for your support at this time.   You may call  or fax , to appeal.    PA# [id number]  The denial letter has been placed in the chart.

## 2022-09-10 NOTE — Telephone Encounter (Signed)
Patient Advocate Encounter   Received notification that prior authorization for [name] (fremanezumab-vfrm) injection 225MG /1.5ML auto-injectors is required.   PA submitted on 09/10/2022 [facility] Insurance [facility] Medicaid of [facility] Electronic Prior Authorization Request Form  Status is pending

## 2022-09-13 NOTE — Telephone Encounter (Signed)
Called pt. She is still taking [name] and doing well on this therapy, no SE. Feels over all, it has reduced migraines. However,  not significantly. Would like to continue therapy for now. She will discuss further at upcoming appt w/ AL,NP 10/08/22 at 3pm.   She will try and stop by office today to sign consent form for [place] to send in appeal letter for [name]. Aware we close at 5pm.

## 2022-09-25 NOTE — Telephone Encounter (Signed)
Pt came and signed appeal consent form. I faxed appeal for [name] to [facility] Appeal Depart. At [phone]. Received fax confirmation, waiting on determination.

## 2022-09-25 NOTE — Telephone Encounter (Signed)
Called pt. She has been dealing with migraines and forgot to come by to sign appeal consent form. She will stop by today to sign.

## 2022-09-26 NOTE — Telephone Encounter (Signed)
Called pt. Informed her of message nurse [facility] sent. Pt said okay thank you.

## 2022-09-26 NOTE — Telephone Encounter (Signed)
Received fax from [facility] stating appeal approved for [name] 09/10/22-09/26/2023. AV#[id number].
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
## 2022-10-08 ENCOUNTER — Ambulatory Visit: Payer: Medicaid Other | Admitting: Family Medicine

## 2022-10-08 ENCOUNTER — Telehealth: Payer: Self-pay | Admitting: Psychiatry

## 2022-10-08 DIAGNOSIS — G43709 Chronic migraine without aura, not intractable, without status migrainosus: Secondary | ICD-10-CM

## 2022-10-08 NOTE — Telephone Encounter (Signed)
Pt said having severe migraine, unable to drive and no one to bring her to appt.

## 2022-10-11 ENCOUNTER — Other Ambulatory Visit: Payer: Self-pay

## 2022-10-11 ENCOUNTER — Ambulatory Visit: Payer: Medicaid Other | Admitting: Family Medicine

## 2022-10-11 ENCOUNTER — Ambulatory Visit (HOSPITAL_COMMUNITY)
Admission: RE | Admit: 2022-10-11 | Discharge: 2022-10-11 | Disposition: A | Discharge status: Home / Self Care | Payer: Medicaid Other | Source: Ambulatory Visit | Attending: Internal Medicine | Admitting: Internal Medicine

## 2022-10-11 ENCOUNTER — Encounter (HOSPITAL_COMMUNITY): Payer: Self-pay

## 2022-10-11 VITALS — BP 111/72 | HR 91 | Temp 98.0°F | Resp 18

## 2022-10-11 DIAGNOSIS — J069 Acute upper respiratory infection, unspecified: Secondary | ICD-10-CM

## 2022-10-11 DIAGNOSIS — K641 Second degree hemorrhoids: Secondary | ICD-10-CM | POA: Diagnosis not present

## 2022-10-11 MED ORDER — HYDROCORTISONE ACETATE 25 MG RE SUPP: 25.0000 mg | Freq: Every day | RECTAL | 0 refills | Status: DC

## 2022-10-11 NOTE — ED Triage Notes (Signed)
Patient reports a headache from Sunday until yesterday.  Patient has had chills, soft stools.  Patient also noticed blood on tissue.  Patient has had 3 soft stools today and does have hemorrhoids.   Patient has had a runny nose, cough, slight sore throat, feels drainage in back of throat and watery eyes.    Patient has had tylenol

## 2022-10-11 NOTE — Discharge Instructions (Addendum)
Since as directed Increase oral fluid intake Increase vegetable and fruit intake Tylenol or ibuprofen as needed for pain and/or fever. If you have worsening symptoms please return to urgent care to be reevaluated.

## 2022-10-11 NOTE — ED Provider Notes (Signed)
[facility]    CSN: [id number] Arrival date & time: 10/11/22  1851      History   Chief Complaint Chief Complaint  Patient presents with   Appointment    19:00   URI    HPI Christine Vega is a 52 y.o. female comes to the urgent care with a 4-day history of nasal congestion, postnasal drainage.  Symptoms started 4 days ago with a headache.  Headache was severe and migrainous in nature.  Headache resolved and the patient started experiencing some sore throat, nasal congestion and postnasal drainage.  She had chills and subjective fever.  His symptoms have improved over the course of the illness.  Her appetite which was initially diminished has improved.  Patient also complains of rectal pain which started a couple of days ago.  She has a history of hemorrhoids and has been constipated over the past few days.  She noticed some blood when she wiped.  No abdominal pain or distention.  She says the pain was severe and it was associated with a sensation of a mass in her rectum.  She saw a hemorrhoid or mass in the anal area.  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, walks/balance ok   Post traumatic stress disorder (PTSD)    Seizures (HCC) 2004   x 1; unknown source   Uterine leiomyoma     Patient Active Problem List   Diagnosis Date Noted   Nasal turbinate hypertrophy 05/14/2017   Iron deficiency anemia due to chronic blood loss 04/05/2016   Cervical high risk HPV (human papillomavirus) test  positive, normal cytology on 02/24/14 03/01/2014   Atypical migraine 06/24/2013   Migraine without aura 06/24/2013   Vaginal pain 12/01/2010    Past Surgical History:  Procedure Laterality Date   CHOLECYSTECTOMY     HERNIA REPAIR     HYSTERECTOMY ABDOMINAL WITH SALPINGECTOMY Left 02/07/2016   Procedure: HYSTERECTOMY ABDOMINAL WITH  left SALPINGECTOMY;  Surgeon: Catalina Antigua, MD;  Location: [facility];  Service: Gynecology;  Laterality: Left;   LAPAROSCOPY  05/16/2011   Procedure: LAPAROSCOPY OPERATIVE;  Surgeon: Scheryl Darter, MD;  Location: [facility];  Service: Gynecology;  Laterality: N/A;  poss oophorectomy   LAPAROSCOPY  08/06/2011   Procedure: LAPAROSCOPY OPERATIVE;  Surgeon: Adam Phenix, MD;  Location: [facility];  Service: Gynecology;  Laterality: N/A;   LAPAROTOMY     for endometriosis/adhesions   SALPINGOOPHORECTOMY  08/06/2011   Procedure: SALPINGO OOPHERECTOMY;  Surgeon: Adam Phenix, MD;  Location: [facility];  Service: Gynecology;  Laterality: Right;   SVD     x 2   TUBAL LIGATION     WISDOM TOOTH EXTRACTION      OB History     Gravida  2   Para  2   Term  2   Preterm  0  AB  0   Living  2      SAB  0   IAB  0   Ectopic  0   Multiple  0   Live Births  2            Home Medications    Prior to Admission medications   Medication Sig Start Date End Date Taking? Authorizing Provider  hydrocortisone (ANUSOL-HC) 25 MG suppository Place 1 suppository (25 mg total) rectally at bedtime. 10/11/22  Yes Jayshawn Colston, Britta Mccreedy, MD  buPROPion (WELLBUTRIN XL) 150 MG 24 hr tablet Take 150 mg by mouth daily.    [provider]  cetirizine (ZYRTEC ALLERGY) 10 MG tablet Take 1 tablet (10 mg total) by mouth daily. 08/04/22   Rising, Lurena Joiner, PA-C  famotidine (PEPCID) 20 MG tablet Take 1 tablet (20 mg total) by mouth at bedtime. 08/04/22   Rising, Rebecca, PA-C  fluticasone (FLONASE) 50 MCG/ACT nasal spray SHAKE LIQUID AND USE 2 SPRAYS IN EACH NOSTRIL DAILY 10/14/18    [provider]  Fremanezumab-vfrm ([name]) 225 MG/1.5ML SOAJ Inject 225 mg into the skin every 30 (thirty) days. 03/27/22   Ocie Doyne, MD  gabapentin (NEURONTIN) 300 MG capsule Take 300 mg by mouth 3 (three) times daily.    [provider]  HYDROcodone-acetaminophen (NORCO) 10-325 MG tablet Take 1 tablet by mouth every 6 (six) hours as needed for moderate pain.    [provider]  hydrOXYzine (ATARAX) 25 MG tablet Take 0.5-1 tablets (12.5-25 mg total) by mouth every 8 (eight) hours as needed for itching. 09/02/22   Wallis Bamberg, PA-C  lamoTRIgine (LAMICTAL) 100 MG tablet Take 100 mg by mouth daily. 11/09/19   [provider]  Lasmiditan Succinate (REYVOW) 100 MG TABS Take 100 mg by mouth as needed (for migraine). 03/27/22   Ocie Doyne, MD  methocarbamol (ROBAXIN) 500 MG tablet Take 1 tablet (500 mg total) by mouth 2 (two) times daily. 06/23/22   Garlon Hatchet, PA-C  omeprazole (PRILOSEC) 40 MG capsule Take 40 mg by mouth daily. 11/09/19   [provider]  prochlorperazine (COMPAZINE) 10 MG tablet Take 10 mg by mouth every 6 (six) hours as needed for nausea or vomiting.    [provider]  promethazine (PHENERGAN) 50 MG suppository Place 50 mg rectally every 8 (eight) hours as needed for nausea or vomiting.    [provider]  propranolol ER (INDERAL LA) 120 MG 24 hr capsule Take 120 mg by mouth daily.    [provider]  REYVOW 100 MG TABS TAKE 1 TABLET BY MOUTH DAILY AS NEEDED 08/11/21   Everlena Cooper, Adam R, DO  sertraline (ZOLOFT) 100 MG tablet Take 100 mg by mouth daily. 07/25/11   [provider]  simvastatin (ZOCOR) 40 MG tablet Take 40 mg by mouth daily. 08/15/19   [provider]  tiZANidine (ZANAFLEX) 4 MG tablet Take 4 mg by mouth every 12 (twelve) hours as needed for muscle spasms.     [provider]  triamcinolone cream (KENALOG) 0.1 % Apply 1 Application topically 2 (two) times daily. 09/02/22    Wallis Bamberg, PA-C  Zavegepant HCl (ZAVZPRET) 10 MG/ACT SOLN Place 10 mg into the nose as needed (for migraine). Max dose 1 spray in 24 hours 03/27/22   Ocie Doyne, MD    Family History Family History  Problem Relation Age of Onset   Migraines Mother    Heart disease Father    Migraines Father    Migraines  Daughter    Breast cancer Neg Hx     Social History Social History   Tobacco Use   Smoking status: Never   Smokeless tobacco: Never  Vaping Use   Vaping Use: Never used  Substance Use Topics   Alcohol use: No    Alcohol/week: 0.0 standard drinks of alcohol   Drug use: No     Allergies   Penicillins, Lipitor [atorvastatin calcium], Mushroom extract complex, Spinach, and Zithromax [azithromycin]   Review of Systems Review of Systems As per HPI  Physical Exam Triage Vital Signs ED Triage Vitals  Enc Vitals Group     BP 10/11/22 1911 111/72     Pulse Rate 10/11/22 1911 91     Resp 10/11/22 1911 18     Temp 10/11/22 1911 98 F (36.7 C)     Temp Source 10/11/22 1911 Oral     SpO2 10/11/22 1911 96 %     Weight --      Height --      Head Circumference --      Peak Flow --      Pain Score 10/11/22 1908 6     Pain Loc --      Pain Edu? --      Excl. in GC? --    No data found.  Updated Vital Signs BP 111/72 (BP Location: Right Arm)   Pulse 91   Temp 98 F (36.7 C) (Oral)   Resp 18   LMP 01/13/2016 (Exact Date)   SpO2 96%   Visual Acuity Right Eye Distance:   Left Eye Distance:   Bilateral Distance:    Right Eye Near:   Left Eye Near:    Bilateral Near:     Physical Exam Vitals and nursing note reviewed.  Constitutional:      Appearance: Normal appearance.  HENT:     Right Ear: Tympanic membrane normal.     Left Ear: Tympanic membrane normal.     Mouth/Throat:     Mouth: Mucous membranes are moist.     Pharynx: No oropharyngeal exudate or posterior oropharyngeal erythema.  Cardiovascular:     Rate and Rhythm: Normal rate and regular  rhythm.     Pulses: Normal pulses.     Heart sounds: Normal heart sounds.  Pulmonary:     Effort: Pulmonary effort is normal.     Breath sounds: Normal breath sounds.  Neurological:     Mental Status: She is alert.      UC Treatments / Results  Labs (all labs ordered are listed, but only abnormal results are displayed) Labs Reviewed - No data to display  EKG   Radiology No results found.  Procedures Procedures (including critical care time)  Medications Ordered in UC Medications - No data to display  Initial Impression / Assessment and Plan / UC Course  I have reviewed the triage vital signs and the nursing notes.  Pertinent labs & imaging results that were available during my care of the patient were reviewed by me and considered in my medical decision making (see chart for details).     1.  Grade 2 hemorrhoids: Anusol suppository nightly Increase fruit and vegetable intake Increase oral fluid intake Return precautions given  2.  Viral URI, improving No indication for testing Tylenol or ibuprofen as needed for pain and/or fever Return precautions given. Final Clinical Impressions(s) / UC Diagnoses   Final diagnoses:  Grade II hemorrhoids  Viral URI     Discharge Instructions  Since as directed Increase oral fluid intake Increase vegetable and fruit intake Tylenol or ibuprofen as needed for pain and/or fever. If you have worsening symptoms please return to urgent care to be reevaluated.   ED Prescriptions     Medication Sig Dispense Auth. Provider   hydrocortisone (ANUSOL-HC) 25 MG suppository Place 1 suppository (25 mg total) rectally at bedtime. 12 suppository Andreka Stucki, Britta Mccreedy, MD      PDMP not reviewed this encounter.   Merrilee Jansky, MD 10/11/22 [phone]

## 2022-11-25 ENCOUNTER — Other Ambulatory Visit: Payer: Self-pay | Admitting: Psychiatry

## 2022-11-26 NOTE — Telephone Encounter (Signed)
Last seen on 03/27/22 Follow up scheduled on 12/19/22

## 2022-12-19 ENCOUNTER — Ambulatory Visit: Payer: Medicaid Other | Admitting: Psychiatry

## 2022-12-19 ENCOUNTER — Encounter: Payer: Self-pay | Admitting: Psychiatry

## 2022-12-19 VITALS — BP 134/82 | HR 89 | Ht 71.5 in | Wt 187.0 lb

## 2022-12-19 DIAGNOSIS — G43719 Chronic migraine without aura, intractable, without status migrainosus: Secondary | ICD-10-CM

## 2022-12-19 MED ORDER — GABAPENTIN 300 MG PO CAPS: ORAL_CAPSULE | ORAL | 6 refills | Status: DC

## 2022-12-19 NOTE — Patient Instructions (Addendum)
Increase gabapentin to 300 mg in the morning, 300 mg in the afternoon and 600 mg at bedtime for one week. Then increase to 600 mg in the morning, 300 mg in the afternoon, and 600 mg at bedtime for one week. Then increase to 600 mg three times a day.  Consider Cefaly device (www.cefaly.com)

## 2022-12-19 NOTE — Progress Notes (Signed)
   CC:  headaches  Follow-up Visit  Last visit: 03/27/22  Brief HPI: 52 year old female with a history of depression and anxiety who follows in clinic for migraines.  At her last visit she was started on [name] for migraine prevention and Zavzpret for rescue.  Interval History: She continues to average 16-17 migraines per month. [name] helps reduce the severity but not frequency of her headaches. Zavzpret works about as well as Reyvow did.   Migraine days per month: 16 Headache free days per month: 14  Current Headache Regimen: Preventative: [name] 225 mg monthly Abortive: Zavzpret 10 mg PRN, Reyvow 100 mg PRN   Prior Therapies                                  Preventive: Topamax Zonisamide 100 mg daily Depakote 500 mg BID Gabapentin 300 mg TID Lyrica 25 mg TID Lamictal 100 mg daily Propranolol 120 mg daily Verapamil 120 mg daily Sertraline 100 gm daily Nortriptyline 100 mg QHS Emgality 120 mg monthly Aimovig 70 mg monthly [name] Qulipta 60 mg daily Botox - stopped working   Rescue: Naproxen Diclofenac Fioricet Imitrex Maxalt Relpax 40 mg PRN Zomig nasal spray 2.5 mg PRN Migranal nasal spray Reyvow 100 mg PRN Nurtec 75 mg PRN Ubrelvy 100 mg PRN Zavzpret 10 mg PRN Tizanidine 4 mg PRN Zofran 4 mg PRN    Physical Exam:   Vital Signs: LMP 01/13/2016 (Exact Date)  GENERAL:  well appearing, in no acute distress, alert  SKIN:  Color, texture, turgor normal. No rashes or lesions HEAD:  Normocephalic/atraumatic. RESP: normal respiratory effort MSK:  No gross joint deformities.   NEUROLOGICAL: Mental Status: Alert, oriented to person, place and time, Follows commands, and Speech fluent and appropriate. Cranial Nerves: PERRL, face symmetric, no dysarthria, hearing grossly intact Motor: moves all extremities equally Gait: normal-based.  IMPRESSION: 52 year old female with a history of depression and anxiety who presents for follow up of migraines. She has  had mild improvement in severity of headaches with [name] and Zavzpret, but continues to have ~16 migraines per month. She would like to continue these medications for now. Will increase gabapentin and see if this provides any more relief from her headaches.  PLAN: -Prevention: Continue [name] 225 mg monthly for now. Increase gabapentin to 300/300/600 x1 week, then 600/300/600 x1 week, then 600 mg TID -Rescue: continue Zavzpret 10 mg PRN -Next steps: Consider Vyepti, Namenda, retrial of Botox (helped a little previously). Discussed device options including Cefaly   Follow-up: 6 months  I spent a total of 35 minutes on the date of the service. Headache education was done. Discussed treatment options including preventive and acute medications, and infusion therapy. Discussed medication side effects, adverse reactions and drug interactions. Written educational materials and patient instructions outlining all of the above were given.  Ocie Doyne, MD 12/19/22 4:01 PM

## 2023-01-07 ENCOUNTER — Encounter (HOSPITAL_COMMUNITY): Payer: Self-pay

## 2023-01-07 ENCOUNTER — Ambulatory Visit (HOSPITAL_COMMUNITY)
Admission: RE | Admit: 2023-01-07 | Discharge: 2023-01-07 | Disposition: A | Discharge status: Home / Self Care | Payer: Medicaid Other | Source: Ambulatory Visit | Attending: Internal Medicine | Admitting: Internal Medicine

## 2023-01-07 VITALS — BP 136/84 | HR 89 | Temp 97.8°F | Resp 16

## 2023-01-07 DIAGNOSIS — S0003XA Contusion of scalp, initial encounter: Secondary | ICD-10-CM | POA: Diagnosis not present

## 2023-01-07 NOTE — ED Triage Notes (Signed)
Pt presents with left side head pain after bumping her hand on the car door. No LOC.

## 2023-01-07 NOTE — Discharge Instructions (Signed)
Physical exam is reassuring There is no deformity or swelling over the scalp.  Skin is intact and there is no bleeding. Please take Tylenol or ibuprofen as needed for pain If you have worsening symptoms please return to urgent care to be reevaluated.

## 2023-01-07 NOTE — ED Provider Notes (Signed)
[facility]    CSN: [id number] Arrival date & time: 01/07/23  1554      History   Chief Complaint Chief Complaint  Patient presents with   Head Injury    HPI Christine Vega is a 52 y.o. female comes to the urgent care with complaints of scalp pain.  Patient hit her head against her car door earlier today.  She denies any loss of consciousness, blurry vision, nausea or vomiting.  No confusion.  Patient has scalp pain which is sharp in nature and is currently about 2 out of 10.  No swelling over the scalp.  Swelling is mainly in the left parietal scalp.  No bleeding.  Patient also complains of pain of the right stump.  No trauma.  No swelling.  No redness.   HPI  Past Medical History:  Diagnosis Date   Anemia    history    Anxiety    Back pain    rotates to left hip and tailbone   Depression    DUB (dysfunctional uterine bleeding) 12/01/2010   Endometriosis    Fractured coccyx (HCC)    Heart murmur    no problems   History of seasonal allergies    Hyperlipidemia    Migraine    Migraines    tx w/depakote/verapamil per pt   Murmur, heart    Neuromuscular disorder (HCC)    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, walks/balance ok   Post traumatic stress disorder (PTSD)    Seizures (HCC) 2004   x 1; unknown source   Uterine leiomyoma     Patient Active Problem List   Diagnosis Date Noted   Nasal turbinate hypertrophy 05/14/2017   Iron deficiency anemia due to chronic blood loss 04/05/2016   Cervical high risk HPV (human papillomavirus) test positive, normal cytology on 02/24/14 03/01/2014   Atypical migraine 06/24/2013   Migraine without aura 06/24/2013   Vaginal pain 12/01/2010    Past Surgical History:  Procedure Laterality Date   CHOLECYSTECTOMY     HERNIA REPAIR     HYSTERECTOMY ABDOMINAL WITH SALPINGECTOMY Left 02/07/2016   Procedure: HYSTERECTOMY ABDOMINAL WITH  left SALPINGECTOMY;  Surgeon: Catalina Antigua, MD;  Location: [facility];  Service: Gynecology;   Laterality: Left;   LAPAROSCOPY  05/16/2011   Procedure: LAPAROSCOPY OPERATIVE;  Surgeon: Scheryl Darter, MD;  Location: [facility];  Service: Gynecology;  Laterality: N/A;  poss oophorectomy   LAPAROSCOPY  08/06/2011   Procedure: LAPAROSCOPY OPERATIVE;  Surgeon: Adam Phenix, MD;  Location: [facility];  Service: Gynecology;  Laterality: N/A;   LAPAROTOMY     for endometriosis/adhesions   SALPINGOOPHORECTOMY  08/06/2011   Procedure: SALPINGO OOPHERECTOMY;  Surgeon: Adam Phenix, MD;  Location: [facility];  Service: Gynecology;  Laterality: Right;   SVD     x 2   TUBAL LIGATION     WISDOM TOOTH EXTRACTION      OB History     Gravida  2   Para  2   Term  2   Preterm  0   AB  0   Living  2      SAB  0   IAB  0   Ectopic  0   Multiple  0   Live Births  2            Home Medications    Prior to Admission medications   Medication Sig Start Date End Date Taking? Authorizing Provider  buPROPion (WELLBUTRIN XL) 150 MG  24 hr tablet Take 150 mg by mouth daily.   Yes [provider]  cetirizine (ZYRTEC ALLERGY) 10 MG tablet Take 1 tablet (10 mg total) by mouth daily. 08/04/22  Yes Rising, Lurena Joiner, PA-C  famotidine (PEPCID) 20 MG tablet Take 1 tablet (20 mg total) by mouth at bedtime. 08/04/22  Yes Rising, Rebecca, PA-C  fluticasone (FLONASE) 50 MCG/ACT nasal spray SHAKE LIQUID AND USE 2 SPRAYS IN EACH NOSTRIL DAILY 10/14/18  Yes [provider]  Fremanezumab-vfrm ([name]) 225 MG/1.5ML SOAJ Inject 225 mg into the skin every 30 (thirty) days. 03/27/22  Yes Ocie Doyne, MD  gabapentin (NEURONTIN) 300 MG capsule Take 300 mg (1 pill) in AM, 1 pill in afternoon, and 600 mg (2 pills) in PM for one week. Then take 2 pills in AM, 1 pill in afternoon, and 2 pills in PM for 1 week. Then take 600 mg (2 pills) three times a day. 12/19/22  Yes Ocie Doyne, MD  HYDROcodone-acetaminophen (NORCO) 10-325 MG tablet Take 1 tablet by mouth every 6 (six) hours as needed for moderate  pain.   Yes [provider]  hydrocortisone (ANUSOL-HC) 25 MG suppository Place 1 suppository (25 mg total) rectally at bedtime. 10/11/22  Yes Corderius Saraceni, Britta Mccreedy, MD  hydrOXYzine (ATARAX) 25 MG tablet Take 0.5-1 tablets (12.5-25 mg total) by mouth every 8 (eight) hours as needed for itching. 09/02/22  Yes Wallis Bamberg, PA-C  lamoTRIgine (LAMICTAL) 100 MG tablet Take 100 mg by mouth daily. 11/09/19  Yes [provider]  Lasmiditan Succinate (REYVOW) 100 MG TABS Take 100 mg by mouth as needed (for migraine). 03/27/22  Yes Ocie Doyne, MD  methocarbamol (ROBAXIN) 500 MG tablet Take 1 tablet (500 mg total) by mouth 2 (two) times daily. 06/23/22  Yes Garlon Hatchet, PA-C  omeprazole (PRILOSEC) 40 MG capsule Take 40 mg by mouth daily. 11/09/19  Yes [provider]  prochlorperazine (COMPAZINE) 10 MG tablet Take 10 mg by mouth every 6 (six) hours as needed for nausea or vomiting.   Yes [provider]  promethazine (PHENERGAN) 50 MG suppository Place 50 mg rectally every 8 (eight) hours as needed for nausea or vomiting.   Yes [provider]  propranolol ER (INDERAL LA) 120 MG 24 hr capsule Take 120 mg by mouth daily.   Yes [provider]  REYVOW 100 MG TABS TAKE 1 TABLET BY MOUTH DAILY AS NEEDED 08/11/21  Yes Jaffe, Adam R, DO  sertraline (ZOLOFT) 100 MG tablet Take 100 mg by mouth daily. 07/25/11  Yes [provider]  simvastatin (ZOCOR) 40 MG tablet Take 40 mg by mouth daily. 08/15/19  Yes [provider]  tiZANidine (ZANAFLEX) 4 MG tablet Take 4 mg by mouth every 12 (twelve) hours as needed for muscle spasms.    Yes [provider]  triamcinolone cream (KENALOG) 0.1 % Apply 1 Application topically 2 (two) times daily. 09/02/22  Yes Wallis Bamberg, PA-C  Zavegepant HCl (ZAVZPRET) 10 MG/ACT SOLN USE 1 SPRAY INTO THE NOSE AS NEEDED FOR MIGRAINE. MAX DAILY 1 SPRAY IN 24 HOURS 11/26/22  Yes Ocie Doyne, MD    Family History Family  History  Problem Relation Age of Onset   Migraines Mother    Heart disease Father    Migraines Father    Migraines Daughter    Breast cancer Neg Hx     Social History Social History   Tobacco Use   Smoking status: Never   Smokeless tobacco: Never  Vaping Use  Vaping status: Never Used  Substance Use Topics   Alcohol use: No    Alcohol/week: 0.0 standard drinks of alcohol   Drug use: No     Allergies   Penicillins, Lipitor [atorvastatin calcium], Mushroom extract complex, Spinach, and Zithromax [azithromycin]   Review of Systems Review of Systems As per HPI  Physical Exam Triage Vital Signs ED Triage Vitals [01/07/23 1607]  Encounter Vitals Group     BP 136/84     Systolic BP Percentile      Diastolic BP Percentile      Pulse Rate 89     Resp 16     Temp 97.8 F (36.6 C)     Temp Source Oral     SpO2 98 %     Weight      Height      Head Circumference      Peak Flow      Pain Score      Pain Loc      Pain Education      Exclude from Growth Chart    No data found.  Updated Vital Signs BP 136/84 (BP Location: Left Arm)   Pulse 89   Temp 97.8 F (36.6 C) (Oral)   Resp 16   LMP 01/13/2016 (Exact Date)   SpO2 98%   Visual Acuity Right Eye Distance:   Left Eye Distance:   Bilateral Distance:    Right Eye Near:   Left Eye Near:    Bilateral Near:     Physical Exam Vitals and nursing note reviewed.  Constitutional:      General: She is not in acute distress.    Appearance: Normal appearance. She is not ill-appearing.  Cardiovascular:     Rate and Rhythm: Normal rate and regular rhythm.  Musculoskeletal:        General: No swelling, tenderness or deformity. Normal range of motion.  Neurological:     General: No focal deficit present.     Mental Status: She is alert and oriented to person, place, and time. Mental status is at baseline.     Comments: No scalp swelling or contusion.  Mild scalp tenderness on palpation.  Tenderness is in the  left parietal region.      UC Treatments / Results  Labs (all labs ordered are listed, but only abnormal results are displayed) Labs Reviewed - No data to display  EKG   Radiology No results found.  Procedures Procedures (including critical care time)  Medications Ordered in UC Medications - No data to display  Initial Impression / Assessment and Plan / UC Course  I have reviewed the triage vital signs and the nursing notes.  Pertinent labs & imaging results that were available during my care of the patient were reviewed by me and considered in my medical decision making (see chart for details).     1.  Contusion of the left parietal scalp: Tylenol or ibuprofen as needed for pain No scalp deformity noted.  Scalp fracture is highly unlikely.  No neurologic deficits.  Reassurance given. Return precautions given. Final Clinical Impressions(s) / UC Diagnoses   Final diagnoses:  Contusion of left temporofrontal scalp, initial encounter     Discharge Instructions      Physical exam is reassuring There is no deformity or swelling over the scalp.  Skin is intact and there is no bleeding. Please take Tylenol or ibuprofen as needed for pain If you have worsening symptoms please return to urgent care to  be reevaluated.   ED Prescriptions   None    PDMP not reviewed this encounter.   Merrilee Jansky, MD 01/07/23 [phone]

## 2023-01-09 ENCOUNTER — Other Ambulatory Visit: Payer: Self-pay

## 2023-01-09 ENCOUNTER — Emergency Department (HOSPITAL_COMMUNITY)
Admission: EM | Admit: 2023-01-09 | Discharge: 2023-01-09 | Disposition: A | Discharge status: Home / Self Care | Payer: Medicaid Other | Attending: Emergency Medicine | Admitting: Emergency Medicine

## 2023-01-09 ENCOUNTER — Encounter (HOSPITAL_COMMUNITY): Payer: Self-pay

## 2023-01-09 DIAGNOSIS — Z23 Encounter for immunization: Secondary | ICD-10-CM | POA: Insufficient documentation

## 2023-01-09 DIAGNOSIS — T63391A Toxic effect of venom of other spider, accidental (unintentional), initial encounter: Secondary | ICD-10-CM | POA: Diagnosis present

## 2023-01-09 DIAGNOSIS — T63301A Toxic effect of unspecified spider venom, accidental (unintentional), initial encounter: Secondary | ICD-10-CM

## 2023-01-09 MED ORDER — TETANUS-DIPHTH-ACELL PERTUSSIS 5-2.5-18.5 LF-MCG/0.5 IM SUSY
0.5000 mL | PREFILLED_SYRINGE | Freq: Once | INTRAMUSCULAR | Status: AC
  Administered 2023-01-09: 0.5 mL via INTRAMUSCULAR
  Filled 2023-01-09: qty 0.5

## 2023-01-09 NOTE — ED Triage Notes (Addendum)
Pt coming in via POV w/ c/o of spider bite that happened about 30 minutes ago. Pt states she saw a gray spider and says "I think he bit me." Pt denies CP, N/V/D, SOB. Endorses a constant burning pain in left leg, where she says she was bit by the spider.

## 2023-01-09 NOTE — Discharge Instructions (Addendum)
Apply ice as needed.  You may take acetaminophen and/or ibuprofen as needed for pain.  You may take diphenhydramine (Benadryl) as needed for itching.  If you develop a lot of swelling where the bite is, return to the emergency department or go to an urgent care center.
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
## 2023-01-18 ENCOUNTER — Ambulatory Visit (HOSPITAL_COMMUNITY): Payer: Medicaid Other

## 2023-02-13 ENCOUNTER — Ambulatory Visit (HOSPITAL_COMMUNITY): Payer: Medicaid Other

## 2023-02-14 ENCOUNTER — Ambulatory Visit (HOSPITAL_COMMUNITY): Payer: Medicaid Other

## 2023-02-28 ENCOUNTER — Other Ambulatory Visit: Payer: Self-pay

## 2023-02-28 ENCOUNTER — Ambulatory Visit (HOSPITAL_COMMUNITY): Payer: Medicaid Other

## 2023-02-28 ENCOUNTER — Encounter (HOSPITAL_COMMUNITY): Payer: Self-pay

## 2023-02-28 ENCOUNTER — Ambulatory Visit (HOSPITAL_COMMUNITY)
Admission: RE | Admit: 2023-02-28 | Discharge: 2023-02-28 | Disposition: A | Discharge status: Home / Self Care | Payer: Medicaid Other | Source: Ambulatory Visit | Attending: Family Medicine | Admitting: Family Medicine

## 2023-02-28 VITALS — BP 125/85 | HR 84 | Temp 99.1°F | Resp 18

## 2023-02-28 DIAGNOSIS — T2020XA Burn of second degree of head, face, and neck, unspecified site, initial encounter: Secondary | ICD-10-CM | POA: Diagnosis not present

## 2023-02-28 MED ORDER — KETOROLAC TROMETHAMINE 10 MG PO TABS: 10.0000 mg | ORAL_TABLET | Freq: Four times a day (QID) | ORAL | 0 refills | Status: DC | PRN

## 2023-02-28 MED ORDER — KETOROLAC TROMETHAMINE 30 MG/ML IJ SOLN
30.0000 mg | Freq: Once | INTRAMUSCULAR | Status: AC
  Administered 2023-02-28: 30 mg via INTRAMUSCULAR

## 2023-02-28 MED ORDER — MUPIROCIN 2 % EX OINT: 1.0000 | TOPICAL_OINTMENT | Freq: Two times a day (BID) | CUTANEOUS | 0 refills | Status: DC

## 2023-02-28 MED ORDER — CEPHALEXIN 250 MG PO CAPS: 250.0000 mg | ORAL_CAPSULE | Freq: Three times a day (TID) | ORAL | 0 refills | Status: AC

## 2023-02-28 MED ORDER — KETOROLAC TROMETHAMINE 30 MG/ML IJ SOLN
INTRAMUSCULAR | Status: AC
  Filled 2023-02-28: qty 1
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
## 2023-02-28 NOTE — Discharge Instructions (Signed)
You have been given a shot of Toradol 30 mg today.  Ketorolac 10 mg tablets--take 1 tablet every 6 hours as needed for pain.  This is the same medicine that is in the shot we just gave you  Take cephalexin 250 mg--1 capsule 3 times daily for 5 days  Put mupirocin ointment on the sore areas twice daily until improved

## 2023-03-08 ENCOUNTER — Telehealth: Payer: Self-pay | Admitting: Psychiatry

## 2023-03-08 NOTE — Telephone Encounter (Signed)
Pt request refill for Fremanezumab-vfrm ([name]) 225 MG/1.5ML SOAJ  send to  [facility] [phone]

## 2023-03-11 ENCOUNTER — Ambulatory Visit: Payer: Medicaid Other | Admitting: Family Medicine

## 2023-03-11 MED ORDER — AJOVY 225 MG/1.5ML ~~LOC~~ SOAJ: 225.0000 mg | SUBCUTANEOUS | 4 refills | Status: DC

## 2023-03-11 NOTE — Telephone Encounter (Signed)
Last seen on 12/19/22 Follow up scheduled on 06/20/23

## 2023-03-20 ENCOUNTER — Ambulatory Visit (HOSPITAL_COMMUNITY)
Admission: EM | Admit: 2023-03-20 | Discharge: 2023-03-20 | Disposition: A | Discharge status: Home / Self Care | Payer: Medicaid Other

## 2023-03-20 ENCOUNTER — Encounter (HOSPITAL_COMMUNITY): Payer: Self-pay

## 2023-03-20 DIAGNOSIS — S39012A Strain of muscle, fascia and tendon of lower back, initial encounter: Secondary | ICD-10-CM | POA: Diagnosis not present

## 2023-03-20 MED ORDER — DEXAMETHASONE SODIUM PHOSPHATE 10 MG/ML IJ SOLN
10.0000 mg | Freq: Once | INTRAMUSCULAR | Status: AC
  Administered 2023-03-20: 10 mg via INTRAMUSCULAR

## 2023-03-20 MED ORDER — METHOCARBAMOL 500 MG PO TABS: 500.0000 mg | ORAL_TABLET | Freq: Two times a day (BID) | ORAL | 0 refills | Status: DC

## 2023-03-20 MED ORDER — DEXAMETHASONE SODIUM PHOSPHATE 10 MG/ML IJ SOLN
INTRAMUSCULAR | Status: AC
  Filled 2023-03-20: qty 1

## 2023-03-20 NOTE — ED Triage Notes (Signed)
Patient here today with c/o LB pain since Saturday. Patient has been taking Hydrocodone for chronic back pain. Patient states that she had increased pain after she bent over to pick up a case of water.

## 2023-03-20 NOTE — Discharge Instructions (Signed)
The steroid injection will help with pain and inflammation.  You can take 500 mg of Tylenol every 8 hours as needed for any breakthrough pain.  You can take the muscle relaxer up to 2 times daily, this may cause drowsiness so do not drink or drive on this medication.  You can also do warm compresses, gentle stretching and Epsom salt baths.  If the pain persists over the next week or you have any changes please follow-up with your orthopedic.

## 2023-03-20 NOTE — ED Provider Notes (Signed)
[facility]    CSN: [id number] Arrival date & time: 03/20/23  1255      History   Chief Complaint Chief Complaint  Patient presents with   Back Pain    HPI Christine Vega is a 52 y.o. female.   Patient presents to clinic for bilateral low back pain that started Saturday.  She was lifting a case of water when she dropped something and kind of lurch forward and felt pain/ burning  in her back with the bending.  She now has pain with bending and twisting.  Reports she has been stiff in the morning.  She is ambulatory without difficulty.  Denies any numbness or tingling.  No incontinence.  No inner leg numbness.  She does take hydrocodone daily for chronic back pain and this helps a little.  She has not tried any additional interventions.    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, walks/balance ok   Post traumatic stress disorder (PTSD)    Seizures (HCC) 2004   x 1; unknown source   Uterine leiomyoma     Patient Active Problem List   Diagnosis Date Noted   Nasal turbinate hypertrophy 05/14/2017   Iron deficiency anemia due to chronic blood loss 04/05/2016   Cervical high risk HPV (human papillomavirus) test positive, normal cytology on 02/24/14 03/01/2014   Atypical migraine 06/24/2013   Migraine without aura 06/24/2013   Vaginal pain 12/01/2010    Past Surgical History:  Procedure Laterality Date   CHOLECYSTECTOMY     HERNIA REPAIR     HYSTERECTOMY ABDOMINAL WITH SALPINGECTOMY Left 02/07/2016   Procedure: HYSTERECTOMY ABDOMINAL  WITH  left SALPINGECTOMY;  Surgeon: Catalina Antigua, MD;  Location: [facility];  Service: Gynecology;  Laterality: Left;   LAPAROSCOPY  05/16/2011   Procedure: LAPAROSCOPY OPERATIVE;  Surgeon: Scheryl Darter, MD;  Location: [facility];  Service: Gynecology;  Laterality: N/A;  poss oophorectomy   LAPAROSCOPY  08/06/2011   Procedure: LAPAROSCOPY OPERATIVE;  Surgeon: Adam Phenix, MD;  Location: [facility];  Service: Gynecology;  Laterality: N/A;   LAPAROTOMY     for endometriosis/adhesions   SALPINGOOPHORECTOMY  08/06/2011   Procedure: SALPINGO OOPHERECTOMY;  Surgeon: Adam Phenix, MD;  Location: [facility];  Service: Gynecology;  Laterality: Right;   SVD     x 2   TUBAL LIGATION     WISDOM TOOTH EXTRACTION      OB History     Gravida  2   Para  2   Term  2   Preterm  0   AB  0   Living  2      SAB  0   IAB  0   Ectopic  0   Multiple  0   Live Births  2            Home Medications    Prior to Admission medications  Medication Sig Start Date End Date Taking? Authorizing Provider  methocarbamol (ROBAXIN) 500 MG tablet Take 1 tablet (500 mg total) by mouth 2 (two) times daily. 03/20/23  Yes Rinaldo Ratel, [place], FNP  buPROPion (WELLBUTRIN XL) 150 MG 24 hr tablet Take 150 mg by mouth daily.    [provider]  cetirizine (ZYRTEC ALLERGY) 10 MG tablet Take 1 tablet (10 mg total) by mouth daily. 08/04/22   Rising, Lurena Joiner, PA-C  famotidine (PEPCID) 20 MG tablet Take 1 tablet (20 mg total) by mouth at bedtime. 08/04/22   Rising, Rebecca, PA-C  fluticasone (FLONASE) 50 MCG/ACT nasal spray SHAKE LIQUID AND USE 2 SPRAYS IN EACH NOSTRIL DAILY 10/14/18   [provider]  Fremanezumab-vfrm ([name]) 225 MG/1.5ML SOAJ Inject 225 mg into the skin every 30 (thirty) days. 03/11/23   Micki Riley, MD  gabapentin (NEURONTIN) 300 MG capsule Take 300 mg (1 pill) in AM, 1 pill in afternoon, and 600 mg (2 pills) in PM for one week. Then take 2 pills in AM, 1 pill in afternoon, and 2  pills in PM for 1 week. Then take 600 mg (2 pills) three times a day. Patient taking differently: Take 300 mg by mouth 2 (two) times daily. 12/19/22   Ocie Doyne, MD  HYDROcodone-acetaminophen (NORCO) 10-325 MG tablet Take 1 tablet by mouth every 6 (six) hours as needed for moderate pain.    [provider]  hydrocortisone (ANUSOL-HC) 25 MG suppository Place 1 suppository (25 mg total) rectally at bedtime. 10/11/22   Merrilee Jansky, MD  hydrOXYzine (ATARAX) 50 MG tablet Take 50 mg by mouth daily. 01/03/23   [provider]  lamoTRIgine (LAMICTAL) 150 MG tablet Take 150 mg by mouth 2 (two) times daily. 01/03/23   [provider]  Lasmiditan Succinate (REYVOW) 100 MG TABS Take 100 mg by mouth as needed (for migraine). 03/27/22   Ocie Doyne, MD  loratadine (CLARITIN) 10 MG tablet Take 10 mg by mouth daily. 01/03/23   [provider]  omeprazole (PRILOSEC) 40 MG capsule Take 40 mg by mouth daily. 11/09/19   [provider]  prochlorperazine (COMPAZINE) 10 MG tablet Take 10 mg by mouth every 6 (six) hours as needed for nausea or vomiting.    [provider]  promethazine (PHENERGAN) 50 MG suppository Place 50 mg rectally every 8 (eight) hours as needed for nausea or vomiting.    [provider]  propranolol ER (INDERAL LA) 120 MG 24 hr capsule Take 120 mg by mouth daily.    [provider]  sertraline (ZOLOFT) 100 MG tablet Take 100 mg by mouth daily. 07/25/11   [provider]  simvastatin (ZOCOR) 40 MG tablet Take 40 mg by mouth daily. 08/15/19   [provider]  tiZANidine (ZANAFLEX) 4 MG tablet Take 4 mg by mouth every 12 (twelve) hours as needed for muscle spasms.     [provider]  VRAYLAR 1.5 MG capsule Take 1.5 mg by mouth daily. 01/03/23   [provider]  zonisamide (ZONEGRAN) 100 MG capsule Take 100 mg by mouth daily.    [provider]    Family History Family History   Problem Relation Age of Onset   Migraines Mother    Heart disease Father    Migraines Father    Migraines Daughter    Breast cancer Neg Hx     Social History Social History   Tobacco Use   Smoking status: Never   Smokeless tobacco: Never  Vaping Use   Vaping status: Never Used  Substance Use Topics   Alcohol use: No    Alcohol/week: 0.0 standard drinks of alcohol   Drug use: No     Allergies   Penicillins, Lipitor [atorvastatin calcium], Mushroom extract complex, Spinach, and Zithromax [azithromycin]   Review of Systems Review of Systems  Per HPI   Physical Exam Triage Vital Signs ED Triage Vitals  Encounter Vitals Group     BP 03/20/23 1402 123/78     Systolic BP Percentile --      Diastolic BP Percentile --      Pulse Rate 03/20/23 1402 87     Resp 03/20/23 1402 16     Temp 03/20/23 1402 98.8 F (37.1 C)     Temp Source 03/20/23 1402 Oral     SpO2 03/20/23 1402 99 %     Weight 03/20/23 1359 187 lb 12.8 oz (85.2 kg)     Height 03/20/23 1359 5' 11.5" (1.816 m)     Head Circumference --      Peak Flow --      Pain Score 03/20/23 1357 6     Pain Loc --      Pain Education --      Exclude from Growth Chart --    No data found.  Updated Vital Signs BP 123/78 (BP Location: Right Arm)   Pulse 87   Temp 98.8 F (37.1 C) (Oral)   Resp 16   Ht 5' 11.5" (1.816 m)   Wt 187 lb 12.8 oz (85.2 kg)   LMP 01/13/2016 (Exact Date)   SpO2 99%   BMI 25.83 kg/m   Visual Acuity Right Eye Distance:   Left Eye Distance:   Bilateral Distance:    Right Eye Near:   Left Eye Near:    Bilateral Near:     Physical Exam Vitals and nursing note reviewed.  Constitutional:      Appearance: Normal appearance.  HENT:     Head: Normocephalic and atraumatic.     Right Ear: External ear normal.     Left Ear: External ear normal.     Nose: Nose normal.     Mouth/Throat:     Mouth: Mucous membranes are moist.  Eyes:     Conjunctiva/sclera: Conjunctivae normal.   Cardiovascular:     Rate and Rhythm: Normal rate and regular rhythm.  Pulmonary:     Effort: Pulmonary effort is normal. No respiratory distress.  Musculoskeletal:        General: No swelling, tenderness, deformity or signs of injury. Normal range of motion.     Lumbar back: No spasms or tenderness. Normal range of motion. Negative right straight leg raise test and negative left straight leg raise test.     Comments: Pain with bending and twisting, no pain with palpation and no changes to ROM. Spine w/o TTP, no step-off or deformity.   Skin:    General: Skin is warm and dry.  Neurological:     General: No focal deficit present.     Mental Status: She is alert.  Psychiatric:        Mood and Affect: Mood normal.        Behavior: Behavior is cooperative.      UC Treatments / Results  Labs (all labs ordered are listed, but only abnormal results are displayed) Labs Reviewed - No data to display  EKG   Radiology No results found.  Procedures Procedures (including critical care time)  Medications Ordered in UC Medications  dexamethasone (DECADRON) injection 10 mg (10 mg Intramuscular Given 03/20/23 1430)    Initial Impression / Assessment and Plan / UC Course  I have reviewed the triage vital signs and the nursing notes.  Pertinent labs & imaging results that were available during my care of the patient were reviewed by me and considered in my medical decision making (see chart for details).  Vitals and triage reviewed, patient is hemodynamically stable. Without red flag symptoms such as inner leg numbness or tingling. Atraumatic. Spine w/o step off or deformity.  Suspect muscular back pain due to being triggered with bending and twisting.  Will trial IM steroid and muscle relaxer.  Plan of care, follow-up care and return precautions given, no questions at this time.     Final Clinical Impressions(s) / UC Diagnoses   Final diagnoses:  Strain of lumbar region, initial  encounter     Discharge Instructions      The steroid injection will help with pain and inflammation.  You can take 500 mg of Tylenol every 8 hours as needed for any breakthrough pain.  You can take the muscle relaxer up to 2 times daily, this may cause drowsiness so do not drink or drive on this medication.  You can also do warm compresses, gentle stretching and Epsom salt baths.  If the pain persists over the next week or you have any changes please follow-up with your orthopedic.     ED Prescriptions     Medication Sig Dispense Auth. Provider   methocarbamol (ROBAXIN) 500 MG tablet Take 1 tablet (500 mg total) by mouth 2 (two) times daily. 20 tablet Shawnte Winton, [place], [place]      PDMP not reviewed this encounter.   Grayson White, [place], [place] 03/20/23 1434

## 2023-03-27 ENCOUNTER — Ambulatory Visit (HOSPITAL_COMMUNITY): Payer: Medicaid Other

## 2023-03-28 ENCOUNTER — Ambulatory Visit (HOSPITAL_COMMUNITY): Payer: Medicaid Other

## 2023-03-28 ENCOUNTER — Other Ambulatory Visit: Payer: Self-pay | Admitting: Psychiatry

## 2023-03-28 MED ORDER — REYVOW 100 MG PO TABS: 100.0000 mg | ORAL_TABLET | ORAL | 2 refills | Status: AC | PRN

## 2023-03-28 MED ORDER — REYVOW 100 MG PO TABS: 100.0000 mg | ORAL_TABLET | ORAL | 2 refills | Status: DC | PRN

## 2023-03-28 NOTE — Telephone Encounter (Signed)
Pt called to confirm her request for the refilling of her Fremanezumab-vfrm ([name]) 225 MG/1.5ML SOAJ  medication has been submitted.  Pt was informed the message by other phone rep was sent to RN for review, pt states if she is not contacted she will simply come to the office, pt was reminded there is an allowance of 24-48 hours for messages to be responded to.

## 2023-03-28 NOTE — Telephone Encounter (Signed)
Pt has questions as to why refills are not being approved. States pharmacy has tried to contact us but no response. Requesting call back

## 2023-03-28 NOTE — Telephone Encounter (Signed)
Dr.Athar you are work in provider this am (Christine Vega patient)   I called patient to discuss the below Rx was sent back in Nov with refills. I called [facility] and was told that [name] does not need PA and they will refill for Rx for her.   Pt also said she need refill for generic Reyvow 100 mg tablets  Last seen on 12/19/22 Last filled on 11/09/22 #8 tablets  Follow up scheduled with Dr.Ahern on 06/20/23 Rx pending to be signed

## 2023-04-05 ENCOUNTER — Ambulatory Visit (HOSPITAL_COMMUNITY): Payer: Medicaid Other

## 2023-05-07 ENCOUNTER — Ambulatory Visit: Payer: Medicaid Other | Admitting: Obstetrics & Gynecology

## 2023-05-09 ENCOUNTER — Telehealth: Payer: Self-pay | Admitting: Psychiatry

## 2023-05-09 DIAGNOSIS — G43719 Chronic migraine without aura, intractable, without status migrainosus: Secondary | ICD-10-CM

## 2023-05-09 MED ORDER — ZAVZPRET 10 MG/ACT NA SOLN
1.0000 | NASAL | 3 refills | Status: DC | PRN
Start: 2023-05-09 — End: 2023-09-02

## 2023-05-09 NOTE — Telephone Encounter (Signed)
E-scribed refill 

## 2023-05-09 NOTE — Telephone Encounter (Signed)
Pt requesting refill of ZAVZPRET 10 MG/ACT SOLN Send to  [facility] [phone]

## 2023-05-20 ENCOUNTER — Encounter (HOSPITAL_COMMUNITY): Payer: Self-pay

## 2023-05-20 ENCOUNTER — Ambulatory Visit (HOSPITAL_COMMUNITY)
Admission: RE | Admit: 2023-05-20 | Discharge: 2023-05-20 | Disposition: A | Discharge status: Home / Self Care | Payer: Medicaid Other | Source: Ambulatory Visit | Attending: Emergency Medicine | Admitting: Emergency Medicine

## 2023-05-20 VITALS — BP 124/75 | HR 89 | Temp 98.3°F | Resp 16

## 2023-05-20 DIAGNOSIS — J302 Other seasonal allergic rhinitis: Secondary | ICD-10-CM | POA: Diagnosis not present

## 2023-05-20 MED ORDER — FLUTICASONE PROPIONATE 50 MCG/ACT NA SUSP: 2.0000 | Freq: Every day | NASAL | 2 refills | Status: AC

## 2023-05-20 NOTE — ED Provider Notes (Signed)
[facility]    CSN: [id number] Arrival date & time: 05/20/23  1547      History   Chief Complaint Chief Complaint  Patient presents with   Nasal Congestion    HPI Christine Vega is a 53 y.o. female.  2 days ago she had a little swelling on the left side of her neck. Was not painful. She put alcohol on it and warm compress. It was gone the next morning. Just wanted to be checked out No sore throat or pain with neck movement   Also some mild eye irritation, runny nose, post nasal drip, and ear discomfort. Uses loratadine daily. She went to pharmacy who recommended sudafed but didn't start yet Thinks its allergies No sick contacts known Denies fever  Past Medical History:  Diagnosis Date   Anemia    history    Anxiety    Back pain    rotates to left hip and tailbone   Depression    DUB (dysfunctional uterine bleeding) 12/01/2010   Endometriosis    Fractured coccyx (HCC)    Heart murmur    no problems   History of seasonal allergies    Hyperlipidemia    Migraine    Migraines    tx w/depakote/verapamil per pt   Murmur, heart    Neuromuscular disorder (HCC)    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, walks/balance ok   Post traumatic stress disorder (PTSD)    Seizures (HCC) 2004   x 1; unknown source   Uterine leiomyoma     Patient Active Problem List   Diagnosis Date Noted   Nasal turbinate hypertrophy 05/14/2017   Iron deficiency anemia due to chronic blood loss 04/05/2016   Cervical high risk HPV (human papillomavirus) test positive, normal cytology on 02/24/14 03/01/2014   Atypical migraine 06/24/2013   Migraine without aura 06/24/2013   Vaginal pain 12/01/2010    Past Surgical History:  Procedure Laterality Date   CHOLECYSTECTOMY     HERNIA REPAIR     HYSTERECTOMY ABDOMINAL WITH SALPINGECTOMY Left 02/07/2016   Procedure: HYSTERECTOMY ABDOMINAL WITH  left SALPINGECTOMY;  Surgeon: Catalina Antigua, MD;  Location: [facility];  Service: Gynecology;  Laterality:  Left;   LAPAROSCOPY  05/16/2011   Procedure: LAPAROSCOPY OPERATIVE;  Surgeon: Scheryl Darter, MD;  Location: [facility];  Service: Gynecology;  Laterality: N/A;  poss oophorectomy   LAPAROSCOPY  08/06/2011   Procedure: LAPAROSCOPY OPERATIVE;  Surgeon: Adam Phenix, MD;  Location: [facility];  Service: Gynecology;  Laterality: N/A;   LAPAROTOMY     for endometriosis/adhesions   SALPINGOOPHORECTOMY  08/06/2011   Procedure: SALPINGO OOPHERECTOMY;  Surgeon: Adam Phenix, MD;  Location: [facility];  Service: Gynecology;  Laterality: Right;   SVD     x 2   TUBAL LIGATION     WISDOM TOOTH EXTRACTION      OB History     Gravida  2   Para  2   Term  2   Preterm  0   AB  0   Living  2      SAB  0   IAB  0   Ectopic  0   Multiple  0   Live Births  2            Home Medications    Prior to Admission medications   Medication Sig Start Date End Date Taking? Authorizing Provider  famotidine (PEPCID) 20 MG tablet Take 1 tablet (20 mg total) by mouth at  bedtime. 08/04/22  Yes Lynk Marti, Lurena Joiner, PA-C  fluticasone (FLONASE) 50 MCG/ACT nasal spray Place 2 sprays into both nostrils daily. 05/20/23  Yes Dartanyan Deasis, Lurena Joiner, PA-C  Fremanezumab-vfrm ([name]) 225 MG/1.5ML SOAJ Inject 225 mg into the skin every 30 (thirty) days. 03/11/23  Yes Micki Riley, MD  Lasmiditan Succinate (REYVOW) 100 MG TABS Take 1 tablet (100 mg total) by mouth as needed (for migraine). 03/28/23  Yes Huston Foley, MD  methocarbamol (ROBAXIN) 500 MG tablet Take 1 tablet (500 mg total) by mouth 2 (two) times daily. 03/20/23  Yes Rinaldo Ratel, [place], FNP  omeprazole (PRILOSEC) 40 MG capsule Take 40 mg by mouth daily. 11/09/19  Yes [provider]  tiZANidine (ZANAFLEX) 4 MG tablet Take 4 mg by mouth every 12 (twelve) hours as needed for muscle spasms.    Yes [provider]  Zavegepant HCl (ZAVZPRET) 10 MG/ACT SOLN Place 1 spray into the nose as needed (For Migraine. Max daily 1 spray in 24 hours). 05/09/23  Yes  Camara, Amalia Hailey, MD  buPROPion (WELLBUTRIN XL) 150 MG 24 hr tablet Take 150 mg by mouth daily.    [provider]  cetirizine (ZYRTEC ALLERGY) 10 MG tablet Take 1 tablet (10 mg total) by mouth daily. 08/04/22   Parker Sawatzky, Lurena Joiner, PA-C  gabapentin (NEURONTIN) 300 MG capsule Take 300 mg (1 pill) in AM, 1 pill in afternoon, and 600 mg (2 pills) in PM for one week. Then take 2 pills in AM, 1 pill in afternoon, and 2 pills in PM for 1 week. Then take 600 mg (2 pills) three times a day. Patient taking differently: Take 300 mg by mouth 2 (two) times daily. 12/19/22   Ocie Doyne, MD  HYDROcodone-acetaminophen (NORCO) 10-325 MG tablet Take 1 tablet by mouth every 6 (six) hours as needed for moderate pain.    [provider]  hydrocortisone (ANUSOL-HC) 25 MG suppository Place 1 suppository (25 mg total) rectally at bedtime. 10/11/22   Merrilee Jansky, MD  hydrOXYzine (ATARAX) 50 MG tablet Take 50 mg by mouth daily. 01/03/23   [provider]  lamoTRIgine (LAMICTAL) 150 MG tablet Take 150 mg by mouth 2 (two) times daily. 01/03/23   [provider]  loratadine (CLARITIN) 10 MG tablet Take 10 mg by mouth daily. 01/03/23   [provider]  prochlorperazine (COMPAZINE) 10 MG tablet Take 10 mg by mouth every 6 (six) hours as needed for nausea or vomiting.    [provider]  promethazine (PHENERGAN) 50 MG suppository Place 50 mg rectally every 8 (eight) hours as needed for nausea or vomiting.    [provider]  propranolol ER (INDERAL LA) 120 MG 24 hr capsule Take 120 mg by mouth daily.    [provider]  sertraline (ZOLOFT) 100 MG tablet Take 100 mg by mouth daily. 07/25/11   [provider]  simvastatin (ZOCOR) 40 MG tablet Take 40 mg by mouth daily. 08/15/19   [provider]  VRAYLAR 1.5 MG capsule Take 1.5 mg by mouth daily. 01/03/23   [provider]  zonisamide (ZONEGRAN) 100 MG capsule Take 100 mg by mouth daily.     [provider]    Family History Family History  Problem Relation Age of Onset   Migraines Mother    Heart disease Father    Migraines Father    Migraines Daughter    Breast cancer Neg Hx     Social History Social History   Tobacco Use   Smoking status:  Never   Smokeless tobacco: Never  Vaping Use   Vaping status: Never Used  Substance Use Topics   Alcohol use: No    Alcohol/week: 0.0 standard drinks of alcohol   Drug use: No     Allergies   Penicillins, Lipitor [atorvastatin calcium], Mushroom extract complex (do not select), Spinach, and Zithromax [azithromycin]   Review of Systems Review of Systems Per HPI  Physical Exam Triage Vital Signs ED Triage Vitals [05/20/23 1603]  Encounter Vitals Group     BP 124/75     Systolic BP Percentile      Diastolic BP Percentile      Pulse Rate 89     Resp 16     Temp 98.3 F (36.8 C)     Temp Source Oral     SpO2 98 %     Weight      Height      Head Circumference      Peak Flow      Pain Score      Pain Loc      Pain Education      Exclude from Growth Chart    No data found.  Updated Vital Signs BP 124/75 (BP Location: Left Arm)   Pulse 89   Temp 98.3 F (36.8 C) (Oral)   Resp 16   LMP 01/13/2016 (Exact Date)   SpO2 98%   Visual Acuity Right Eye Distance:   Left Eye Distance:   Bilateral Distance:    Right Eye Near:   Left Eye Near:    Bilateral Near:     Physical Exam Vitals and nursing note reviewed.  Constitutional:      General: She is not in acute distress.    Appearance: Normal appearance. She is not ill-appearing.  HENT:     Right Ear: Tympanic membrane and ear canal normal.     Left Ear: Tympanic membrane and ear canal normal.     Nose: No congestion or rhinorrhea.     Mouth/Throat:     Mouth: Mucous membranes are moist.     Pharynx: Oropharynx is clear. No posterior oropharyngeal erythema.  Eyes:     Conjunctiva/sclera: Conjunctivae normal.  Neck:     Thyroid: No  thyroid mass or thyroid tenderness.     Trachea: Trachea and phonation normal.  Cardiovascular:     Rate and Rhythm: Normal rate and regular rhythm.     Pulses: Normal pulses.     Heart sounds: Normal heart sounds.  Pulmonary:     Effort: Pulmonary effort is normal.     Breath sounds: Normal breath sounds.  Musculoskeletal:     Cervical back: Normal range of motion. No rigidity or tenderness.  Lymphadenopathy:     Cervical: No cervical adenopathy.  Skin:    General: Skin is warm and dry.  Neurological:     Mental Status: She is alert and oriented to person, place, and time.      UC Treatments / Results  Labs (all labs ordered are listed, but only abnormal results are displayed) Labs Reviewed - No data to display  EKG   Radiology No results found.  Procedures Procedures (including critical care time)  Medications Ordered in UC Medications - No data to display  Initial Impression / Assessment and Plan / UC Course  I have reviewed the triage vital signs and the nursing notes.  Pertinent labs & imaging results that were available during my care of the patient were reviewed by me  and considered in my medical decision making (see chart for details).  Neck symptoms resolved Normal exam Reassurance provided No red flags. Vitals stable.   Recommend trying the sudafed she picked up Can start daily flonase Monitor symptoms Return if needed  Final Clinical Impressions(s) / UC Diagnoses   Final diagnoses:  Seasonal allergies     Discharge Instructions      Continue symptomatic care for allergies vs virus I do like sudafed! You can also add flonase daily Drink lots of water  Monitor for any neck swelling or pain. If this occurs, please go to the emergency department      ED Prescriptions     Medication Sig Dispense Auth. Provider   fluticasone (FLONASE) 50 MCG/ACT nasal spray Place 2 sprays into both nostrils daily. 9.9 mL Donovin Kraemer, Lurena Joiner, PA-C       PDMP not reviewed this encounter.   Eisa Necaise, Lurena Joiner, [place] 05/20/23 1712

## 2023-05-20 NOTE — Discharge Instructions (Signed)
Continue symptomatic care for allergies vs virus I do like sudafed! You can also add flonase daily Drink lots of water  Monitor for any neck swelling or pain. If this occurs, please go to the emergency department

## 2023-05-20 NOTE — ED Triage Notes (Signed)
Reports left side neck pain and swelling x 3 days.

## 2023-05-27 ENCOUNTER — Ambulatory Visit (INDEPENDENT_AMBULATORY_CARE_PROVIDER_SITE_OTHER): Payer: Medicaid Other | Admitting: Obstetrics & Gynecology

## 2023-05-27 ENCOUNTER — Encounter: Payer: Self-pay | Admitting: Obstetrics & Gynecology

## 2023-05-27 VITALS — BP 121/80 | HR 89 | Wt 189.0 lb

## 2023-05-27 DIAGNOSIS — Z01419 Encounter for gynecological examination (general) (routine) without abnormal findings: Secondary | ICD-10-CM

## 2023-05-27 DIAGNOSIS — Z113 Encounter for screening for infections with a predominantly sexual mode of transmission: Secondary | ICD-10-CM | POA: Diagnosis not present

## 2023-05-27 DIAGNOSIS — Z1231 Encounter for screening mammogram for malignant neoplasm of breast: Secondary | ICD-10-CM

## 2023-05-27 NOTE — Progress Notes (Signed)
 GYNECOLOGY ANNUAL PREVENTATIVE CARE ENCOUNTER NOTE  History:     Christine Vega is a 53 y.o. [id number] female s/p TAH/LS in 2017 for benign indications here for a routine annual exam.  Current complaints: none, desires serum STI screen but reports no sexual activity for last three years. Denies abnormal vaginal bleeding, discharge, pelvic pain, or other gynecologic concerns.    Gynecologic History Patient's last menstrual period was 01/13/2016 (exact date). Contraception: status post hysterectomy Last Mammogram: 06/14/2022.  Result was normal Last Colonoscopy: 3 years ago as per patient.  Result was abnormal with polyps.  Obstetric History OB History  Gravida Para Term Preterm AB Living  2 2 2  0 0 2  SAB IAB Ectopic Multiple Live Births  0 0 0 0 2    # Outcome Date GA Lbr Len/2nd Weight Sex Type Anes PTL Lv  2 Term      Vag-Spont   LIV  1 Term      Vag-Spont   LIV    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, walks/balance ok   Post traumatic stress disorder (PTSD)    Seizures (HCC) 2004   x 1; unknown source   Uterine leiomyoma     Past Surgical History:  Procedure Laterality Date   CHOLECYSTECTOMY     HERNIA REPAIR     HYSTERECTOMY ABDOMINAL WITH SALPINGECTOMY Left 02/07/2016   Procedure: HYSTERECTOMY ABDOMINAL WITH  left SALPINGECTOMY;  Surgeon: Verlyn Goad, MD;  Location: [facility];  Service: Gynecology;  Laterality: Left;   LAPAROSCOPY  05/16/2011   Procedure: LAPAROSCOPY OPERATIVE;  Surgeon: Onnie Bilis, MD;  Location: [facility];  Service: Gynecology;   Laterality: N/A;  poss oophorectomy   LAPAROSCOPY  08/06/2011   Procedure: LAPAROSCOPY OPERATIVE;  Surgeon: Tresia Fruit, MD;  Location: [facility];  Service: Gynecology;  Laterality: N/A;   LAPAROTOMY     for endometriosis/adhesions   SALPINGOOPHORECTOMY  08/06/2011   Procedure: SALPINGO OOPHERECTOMY;  Surgeon: Tresia Fruit, MD;  Location: [facility];  Service: Gynecology;  Laterality: Right;   SVD     x 2   TUBAL LIGATION     WISDOM TOOTH EXTRACTION      Current Outpatient Medications on File Prior to Visit  Medication Sig Dispense Refill   buPROPion  (WELLBUTRIN  XL) 150 MG 24 hr tablet Take 150 mg by mouth daily.     cetirizine  (ZYRTEC  ALLERGY) 10 MG tablet Take 1 tablet (10 mg total) by mouth daily. 30 tablet 2   famotidine  (PEPCID ) 20 MG tablet Take 1 tablet (20 mg total) by mouth at bedtime. 30 tablet 2   fluticasone  (FLONASE ) 50 MCG/ACT nasal spray Place 2 sprays into both nostrils daily. 9.9 mL 2   Fremanezumab -vfrm ([name] ) 225 MG/1.5ML SOAJ Inject 225 mg into the skin every 30 (thirty) days. 1.5 mL 4   gabapentin  (NEURONTIN ) 300 MG capsule Take 300 mg (1 pill) in AM, 1 pill in  afternoon, and 600 mg (2 pills) in PM for one week. Then take 2 pills in AM, 1 pill in afternoon, and 2 pills in PM for 1 week. Then take 600 mg (2 pills) three times a day. (Patient taking differently: Take 300 mg by mouth 2 (two) times daily.) 180 capsule 6   HYDROcodone -acetaminophen  (NORCO) 10-325 MG tablet Take 1 tablet by mouth every 6 (six) hours as needed for moderate pain.     hydrocortisone  (ANUSOL -HC) 25 MG suppository Place 1 suppository (25 mg total) rectally at bedtime. 12 suppository 0   hydrOXYzine  (ATARAX ) 50 MG tablet Take 50 mg by mouth daily.     lamoTRIgine (LAMICTAL) 150 MG tablet Take 150 mg by mouth 2 (two) times daily.     Lasmiditan  Succinate (REYVOW ) 100 MG TABS Take 1 tablet (100 mg total) by mouth as needed (for migraine). 15 tablet 2   loratadine (CLARITIN) 10 MG tablet Take 10 mg by  mouth daily.     methocarbamol  (ROBAXIN ) 500 MG tablet Take 1 tablet (500 mg total) by mouth 2 (two) times daily. 20 tablet 0   omeprazole (PRILOSEC) 40 MG capsule Take 40 mg by mouth daily.     prochlorperazine (COMPAZINE) 10 MG tablet Take 10 mg by mouth every 6 (six) hours as needed for nausea or vomiting.     promethazine  (PHENERGAN ) 50 MG suppository Place 50 mg rectally every 8 (eight) hours as needed for nausea or vomiting.     propranolol ER (INDERAL LA) 120 MG 24 hr capsule Take 120 mg by mouth daily.     sertraline  (ZOLOFT ) 100 MG tablet Take 100 mg by mouth daily.     simvastatin (ZOCOR) 40 MG tablet Take 40 mg by mouth daily.     tiZANidine (ZANAFLEX) 4 MG tablet Take 4 mg by mouth every 12 (twelve) hours as needed for muscle spasms.      VRAYLAR 1.5 MG capsule Take 1.5 mg by mouth daily.     Zavegepant HCl (ZAVZPRET ) 10 MG/ACT SOLN Place 1 spray into the nose as needed (For Migraine. Max daily 1 spray in 24 hours). 6 each 3   zonisamide  (ZONEGRAN ) 100 MG capsule Take 100 mg by mouth daily.     No current facility-administered medications on file prior to visit.    Allergies  Allergen Reactions   Penicillins Hives, Itching and Other (See Comments)    Has patient had a PCN reaction causing immediate rash, facial/tongue/throat swelling, SOB or lightheadedness with hypotension: No Has patient had a PCN reaction causing severe rash involving mucus membranes or skin necrosis: No Has patient had a PCN reaction that required hospitalization: No Has patient had a PCN reaction occurring within the last 10 years: Yes If all of the above answers are "NO", then may proceed with Cephalosporin use.    Lipitor [Atorvastatin Calcium] Hives    myalgia   Mushroom Extract Complex (Obsolete) Other (See Comments)    Itchy throat and sweating    Spinach Other (See Comments)    Itchy throat and sweating   Zithromax [Azithromycin] Hives and Itching    Social History:  reports that she has  never smoked. She has never used smokeless tobacco. She reports that she does not drink alcohol and does not use drugs.  Family History  Problem Relation Age of Onset   Migraines Mother    Heart disease Father    Migraines Father    Migraines Daughter    Breast cancer Neg Hx  The following portions of the patient's history were reviewed and updated as appropriate: allergies, current medications, past family history, past medical history, past social history, past surgical history and problem list.  Review of Systems Pertinent items noted in HPI and remainder of comprehensive ROS otherwise negative.  Physical Exam:  BP 121/80   Pulse 89   Wt 189 lb (85.7 kg)   LMP 01/13/2016 (Exact Date)   BMI 25.99 kg/m  CONSTITUTIONAL: Well-developed, well-nourished female in no acute distress.  HENT:  Normocephalic, atraumatic, External right and left ear normal.  EYES: Conjunctivae and EOM are normal. Pupils are equal, round, and reactive to light. No scleral icterus.  NECK: Normal range of motion, supple, no masses.  Normal thyroid .  SKIN: Skin is warm and dry. No rash noted. Not diaphoretic. No erythema. No pallor. MUSCULOSKELETAL: Normal range of motion. No tenderness.  No cyanosis, clubbing, or edema. NEUROLOGIC: Alert and oriented to person, place, and time. Normal reflexes, muscle tone coordination.  PSYCHIATRIC: Normal mood and affect. Normal behavior. Normal judgment and thought content. CARDIOVASCULAR: Normal heart rate noted, regular rhythm RESPIRATORY:  Effort and breath sounds normal, no problems with respiration noted. BREASTS: Deferred. ABDOMEN: Soft, no distention noted.  No tenderness, rebound or guarding.  PELVIC: Deferred.   Assessment and Plan:     1. Breast cancer screening by mammogram Mammogram scheduled for breast cancer screening. - MM 3D SCREENING MAMMOGRAM BILATERAL BREAST; Future  2. Routine screening for STI (sexually transmitted infection) STI screen  done, will follow up results and manage accordingly. - RPR+HBsAg+HCVAb+HIV  3. Well woman exam with routine gynecological exam (Primary) No need for any further pap smears as patient had a total hysterectomy for benign indications. Colon cancer screening is up to date. Routine preventative health maintenance measures emphasized. Please refer to After Visit Summary for other counseling recommendations.      Lenoard Rad, MD, FACOG Obstetrician & Gynecologist, [facility] [employer], [facility]

## 2023-05-28 ENCOUNTER — Encounter: Payer: Self-pay | Admitting: Obstetrics & Gynecology

## 2023-05-28 LAB — RPR+HBSAG+HCVAB+...
HIV Screen 4th Generation wRfx: NONREACTIVE
Hep C Virus Ab: NONREACTIVE
Hepatitis B Surface Ag: NEGATIVE
RPR Ser Ql: NONREACTIVE

## 2023-06-10 ENCOUNTER — Telehealth: Payer: Self-pay | Admitting: Psychiatry

## 2023-06-10 NOTE — Telephone Encounter (Signed)
 Appointment details confirmed

## 2023-06-17 ENCOUNTER — Ambulatory Visit: Payer: Medicaid Other

## 2023-06-19 ENCOUNTER — Ambulatory Visit

## 2023-06-20 ENCOUNTER — Telehealth: Payer: Self-pay | Admitting: Neurology

## 2023-06-20 ENCOUNTER — Ambulatory Visit

## 2023-06-20 ENCOUNTER — Ambulatory Visit: Payer: Medicaid Other | Admitting: Neurology

## 2023-06-20 NOTE — Telephone Encounter (Addendum)
 I called pt and she is having symptoms UR, fever.  She did not want to cancel appt until she has contacted her pcp or urgent care about appt with them.  She will call back.  She was offered  VV at 1500.  I relayed that next available was in May with Dr. Lucia Gaskins.  As long as appt, would prescribe up to appt. Pt called back and will be seeing urgent care at 1400.  So will cancel appt with Dr. Lucia Gaskins today.  She has refills on her medications but will call back if needs more prior to her appt.  Appt made 08-19-2023 1330.  Placed on waitlist.

## 2023-06-20 NOTE — Telephone Encounter (Signed)
 Pt called in requesting a cb from one of the nurses regarding this afternoon's appt. She said she is sick but really would like to speak with someone rather than cancelling her appt. I offered to change to a VV and she stated she would like to speak with someone first. Verified best cb # is [phone].

## 2023-06-21 ENCOUNTER — Ambulatory Visit

## 2023-06-26 NOTE — Telephone Encounter (Signed)
 This encounter was created in error - please disregard.

## 2023-07-01 ENCOUNTER — Ambulatory Visit

## 2023-07-03 ENCOUNTER — Ambulatory Visit
Admission: RE | Admit: 2023-07-03 | Discharge: 2023-07-03 | Discharge status: Home / Self Care | Source: Ambulatory Visit | Attending: Obstetrics & Gynecology | Admitting: Obstetrics & Gynecology

## 2023-07-03 DIAGNOSIS — Z1231 Encounter for screening mammogram for malignant neoplasm of breast: Secondary | ICD-10-CM

## 2023-07-05 ENCOUNTER — Encounter: Payer: Self-pay | Admitting: Obstetrics & Gynecology

## 2023-07-22 ENCOUNTER — Telehealth: Payer: Self-pay | Admitting: Psychiatry

## 2023-07-22 NOTE — Telephone Encounter (Signed)
 Pt called to verify appointment

## 2023-08-07 ENCOUNTER — Other Ambulatory Visit: Payer: Self-pay | Admitting: Neurology

## 2023-08-11 ENCOUNTER — Other Ambulatory Visit: Payer: Self-pay

## 2023-08-11 ENCOUNTER — Encounter (HOSPITAL_COMMUNITY): Payer: Self-pay | Admitting: Emergency Medicine

## 2023-08-11 ENCOUNTER — Emergency Department (HOSPITAL_COMMUNITY)
Admission: EM | Admit: 2023-08-11 | Discharge: 2023-08-11 | Disposition: A | Discharge status: Home / Self Care | Attending: Emergency Medicine | Admitting: Emergency Medicine

## 2023-08-11 DIAGNOSIS — E860 Dehydration: Secondary | ICD-10-CM | POA: Diagnosis not present

## 2023-08-11 DIAGNOSIS — G8929 Other chronic pain: Secondary | ICD-10-CM | POA: Diagnosis not present

## 2023-08-11 DIAGNOSIS — K047 Periapical abscess without sinus: Secondary | ICD-10-CM | POA: Insufficient documentation

## 2023-08-11 DIAGNOSIS — M545 Low back pain, unspecified: Secondary | ICD-10-CM | POA: Insufficient documentation

## 2023-08-11 LAB — COMPREHENSIVE METABOLIC PANEL WITH GFR
ALT: 14 U/L (ref 0–44)
AST: 18 U/L (ref 15–41)
Albumin: 3.9 g/dL (ref 3.5–5.0)
Alkaline Phosphatase: 55 U/L (ref 38–126)
Anion gap: 11 (ref 5–15)
BUN: 9 mg/dL (ref 6–20)
CO2: 24 mmol/L (ref 22–32)
Calcium: 9.8 mg/dL (ref 8.9–10.3)
Chloride: 103 mmol/L (ref 98–111)
Creatinine, Ser: 0.71 mg/dL (ref 0.44–1.00)
GFR, Estimated: >60 mL/min (ref >60)
Glucose, Bld: 110 mg/dL — ABNORMAL HIGH (ref 70–99)
Potassium: 3.7 mmol/L (ref 3.5–5.1)
Sodium: 138 mmol/L (ref 135–145)
Total Bilirubin: 0.6 mg/dL (ref 0.0–1.2)
Total Protein: 6.9 g/dL (ref 6.5–8.1)

## 2023-08-11 LAB — CBC
HCT: 37 % (ref 36.0–46.0)
Hemoglobin: 11.8 g/dL — ABNORMAL LOW (ref 12.0–15.0)
MCH: 27.6 pg (ref 26.0–34.0)
MCHC: 31.9 g/dL (ref 30.0–36.0)
MCV: 86.7 fL (ref 80.0–100.0)
Platelets: 243 10*3/uL (ref 150–400)
RBC: 4.27 MIL/uL (ref 3.87–5.11)
RDW: 14.1 % (ref 11.5–15.5)
WBC: 5.9 10*3/uL (ref 4.0–10.5)
nRBC: 0 % (ref 0.0–0.2)

## 2023-08-11 LAB — URINALYSIS, ROUTINE W REFLEX MICROSCOPIC
Bilirubin Urine: NEGATIVE
Glucose, UA: NEGATIVE mg/dL
Hgb urine dipstick: NEGATIVE
Ketones, ur: NEGATIVE mg/dL
Leukocytes,Ua: NEGATIVE
Nitrite: NEGATIVE
Protein, ur: NEGATIVE mg/dL
Specific Gravity, Urine: 1.008 (ref 1.005–1.030)
pH: 6 (ref 5.0–8.0)

## 2023-08-11 MED ORDER — KETOROLAC TROMETHAMINE 15 MG/ML IJ SOLN
15.0000 mg | Freq: Once | INTRAMUSCULAR | Status: AC
  Administered 2023-08-11: 15 mg via INTRAMUSCULAR
  Filled 2023-08-11: qty 1

## 2023-08-11 MED ORDER — CLINDAMYCIN HCL 150 MG PO CAPS: 150.0000 mg | ORAL_CAPSULE | Freq: Four times a day (QID) | ORAL | 0 refills | Status: DC

## 2023-08-11 NOTE — Discharge Instructions (Signed)
 I sent a prescription for antibiotics to your pharmacy for antibiotic coverage. Please follow up with dentistry.  Follow up with orthopedics for further back pain management.

## 2023-08-11 NOTE — ED Triage Notes (Signed)
 Patient coming to ED for evaluation of lower back pain x 1 month.  States she has been seen by orthopedics for same.  Feels that a chair she had was causing pain due to "hitting my back on the wooden part."  Pt also feels like she might be dehydrated.  Unable to explain why she feels dehydrate, but states she has been out of her routine medications x 10 days.  States "they forgot to send my new box."

## 2023-08-11 NOTE — ED Provider Notes (Signed)
 [place] EMERGENCY DEPARTMENT AT [facility] Provider Note   CSN: [id number] Arrival date & time: 08/11/23  0507     History  Chief Complaint  Patient presents with   Dehydration   Back Pain    Christine Vega is a 53 y.o. female.  Patient presents emergency room with multiple complaints.  She complains of chronic back pain which is currently being treated by orthopedic surgery.  She also complains of feeling that she may be dehydrated because occasionally her mouth gets dry.  She endorses drinking plenty of water.  Patient also wants evaluation of a sore spot in her mouth.  She denies fever, nausea, vomiting, shortness of breath, chest pain, abdominal pain.  Past medical history significant for depression, anxiety, possible seizures, migraines, back pain   Back Pain      Home Medications Prior to Admission medications   Medication Sig Start Date End Date Taking? Authorizing Provider  clindamycin (CLEOCIN) 150 MG capsule Take 1 capsule (150 mg total) by mouth every 6 (six) hours. 08/11/23  Yes Elisa Guest, PA-C  buPROPion  (WELLBUTRIN  XL) 150 MG 24 hr tablet Take 150 mg by mouth daily.    [provider]  cetirizine  (ZYRTEC  ALLERGY) 10 MG tablet Take 1 tablet (10 mg total) by mouth daily. 08/04/22   Rising, Ivette Marks, PA-C  famotidine  (PEPCID ) 20 MG tablet Take 1 tablet (20 mg total) by mouth at bedtime. 08/04/22   Rising, Ivette Marks, PA-C  fluticasone  (FLONASE ) 50 MCG/ACT nasal spray Place 2 sprays into both nostrils daily. 05/20/23   Rising, Ivette Marks, PA-C  Fremanezumab -vfrm ([name] ) 225 MG/1.5ML SOAJ Inject 225 mg into the skin every 30 (thirty) days. 03/11/23   Lisabeth Rider, MD  Fremanezumab -vfrm ([name] ) 225 MG/1.5ML SOSY INJECT 225MG  SUBCUTANEOUSLY EVERY 30 DAYS 08/08/23   Sethi, Pramod S, MD  gabapentin  (NEURONTIN ) 300 MG capsule Take 300 mg (1 pill) in AM, 1 pill in afternoon, and 600 mg (2 pills) in PM for one week. Then take 2 pills in AM, 1 pill in  afternoon, and 2 pills in PM for 1 week. Then take 600 mg (2 pills) three times a day. Patient taking differently: Take 300 mg by mouth 2 (two) times daily. 12/19/22   Dala Dublin, MD  HYDROcodone -acetaminophen  (NORCO) 10-325 MG tablet Take 1 tablet by mouth every 6 (six) hours as needed for moderate pain.    [provider]  hydrocortisone  (ANUSOL -HC) 25 MG suppository Place 1 suppository (25 mg total) rectally at bedtime. 10/11/22   Corine Dice, MD  hydrOXYzine  (ATARAX ) 50 MG tablet Take 50 mg by mouth daily. 01/03/23   [provider]  lamoTRIgine (LAMICTAL) 150 MG tablet Take 150 mg by mouth 2 (two) times daily. 01/03/23   [provider]  Lasmiditan  Succinate (REYVOW ) 100 MG TABS Take 1 tablet (100 mg total) by mouth as needed (for migraine). 03/28/23   Athar, Saima, MD  loratadine (CLARITIN) 10 MG tablet Take 10 mg by mouth daily. 01/03/23   [provider]  methocarbamol  (ROBAXIN ) 500 MG tablet Take 1 tablet (500 mg total) by mouth 2 (two) times daily. 03/20/23   Harlow Lighter, [place]  N, FNP  omeprazole (PRILOSEC) 40 MG capsule Take 40 mg by mouth daily. 11/09/19   [provider]  prochlorperazine (COMPAZINE) 10 MG tablet Take 10 mg by mouth every 6 (six) hours as needed for nausea or vomiting.    [provider]  promethazine  (PHENERGAN ) 50 MG suppository Place 50 mg rectally every 8 (  eight) hours as needed for nausea or vomiting.    [provider]  propranolol ER (INDERAL LA) 120 MG 24 hr capsule Take 120 mg by mouth daily.    [provider]  sertraline  (ZOLOFT ) 100 MG tablet Take 100 mg by mouth daily. 07/25/11   [provider]  simvastatin (ZOCOR) 40 MG tablet Take 40 mg by mouth daily. 08/15/19   [provider]  tiZANidine (ZANAFLEX) 4 MG tablet Take 4 mg by mouth every 12 (twelve) hours as needed for muscle spasms.     [provider]  VRAYLAR 1.5 MG capsule Take 1.5 mg by mouth daily.  01/03/23   [provider]  Zavegepant HCl (ZAVZPRET ) 10 MG/ACT SOLN Place 1 spray into the nose as needed (For Migraine. Max daily 1 spray in 24 hours). 05/09/23   Camara, Amadou, MD  zonisamide  (ZONEGRAN ) 100 MG capsule Take 100 mg by mouth daily.    [provider]      Allergies    Penicillins, Lipitor [atorvastatin calcium], Mushroom extract complex (obsolete), Spinach, and Zithromax [azithromycin]    Review of Systems   Review of Systems  Musculoskeletal:  Positive for back pain.    Physical Exam Updated Vital Signs BP 127/79 (BP Location: Right Arm)   Pulse 88   Temp 98.4 F (36.9 C)   Resp 18   Ht 5\' 11"  (1.803 m)   Wt 85.7 kg   LMP 01/13/2016 (Exact Date)   SpO2 98%   BMI 26.36 kg/m  Physical Exam Vitals and nursing note reviewed.  HENT:     Head: Normocephalic and atraumatic.     Mouth/Throat:     Comments: Small inflamed area on lower right gum line. No drainable abscess appreciated Eyes:     Conjunctiva/sclera: Conjunctivae normal.  Cardiovascular:     Rate and Rhythm: Normal rate and regular rhythm.  Pulmonary:     Effort: Pulmonary effort is normal. No respiratory distress.     Breath sounds: Normal breath sounds.  Musculoskeletal:        General: No signs of injury.     Cervical back: Normal range of motion.  Skin:    General: Skin is dry.  Neurological:     Mental Status: She is alert.  Psychiatric:        Speech: Speech normal.        Behavior: Behavior normal.     ED Results / Procedures / Treatments   Labs (all labs ordered are listed, but only abnormal results are displayed) Labs Reviewed  COMPREHENSIVE METABOLIC PANEL WITH GFR - Abnormal; Notable for the following components:      Result Value   Glucose, Bld 110 (*)    All other components within normal limits  CBC - Abnormal; Notable for the following components:   Hemoglobin 11.8 (*)    All other components within normal limits  URINALYSIS, ROUTINE W REFLEX  MICROSCOPIC - Abnormal; Notable for the following components:   Color, Urine STRAW (*)    All other components within normal limits    EKG None  Radiology No results found.  Procedures Procedures    Medications Ordered in ED Medications  ketorolac  (TORADOL ) 15 MG/ML injection 15 mg (15 mg Intramuscular Given 08/11/23 0615)    ED Course/ Medical Decision Making/ A&P  Medical Decision Making Amount and/or Complexity of Data Reviewed Labs: ordered.  Risk Prescription drug management.   This patient presents to the ED for concern of dehydration, back pain, dental abscess, this involves an extensive number of treatment options, and is a complaint that carries with it a high risk of complications and morbidity.     Co morbidities that complicate the patient evaluation  Anxiety   Lab Tests:  I Ordered, and personally interpreted labs.  The pertinent results include: Grossly unremarkable CBC, BMP, UA   Problem List / ED Course / Critical interventions / Medication management   I ordered medication including Toradol  for back pain Reevaluation of the patient after these medicines showed that the patient improved I have reviewed the patients home medicines and have made adjustments as needed   Social Determinants of Health:  Patient has Medicaid for primary health insurance type   Test / Admission - Considered:  No clinical signs of dehydration. Lab work grossly unremarkable. Back pain is chronic in nature with no red flag symptoms that require imaging at this time. I discussed possible imaging but the patient states that her orthopedist has imaged and is managing her back pain. She is mostly looking for pain relief. Patient with improved pain after toradol . I did look at the patient's mouth. Small inflamed area concerning for possible draining abscess noted. Patient with penicillin allergy. Will prescribe course of clindamycin for  treatment and recommend follow up with dentistry. No indication for further emergent workup or admission         Final Clinical Impression(s) / ED Diagnoses Final diagnoses:  Chronic low back pain without sciatica, unspecified back pain laterality  Dental infection    Rx / DC Orders ED Discharge Orders          Ordered    clindamycin (CLEOCIN) 150 MG capsule  Every 6 hours        08/11/23 0639              Elisa Guest, PA-C 08/11/23 0646    Lindle Rhea, MD 08/11/23 [phone]

## 2023-08-19 ENCOUNTER — Ambulatory Visit: Admitting: Neurology

## 2023-08-19 ENCOUNTER — Telehealth: Payer: Self-pay | Admitting: Psychiatry

## 2023-08-19 NOTE — Telephone Encounter (Signed)
 Pt cancelled appointment due to being congested, cough, on and fever, nauseated. Messged Dr. Tresia Fruit if could change appointment to MyChart Video Visit. Dr. Tresia Fruit message back: ok I can find a sooner spot for her. she can reschedule a virtual let me look and we can call her back.

## 2023-08-19 NOTE — Telephone Encounter (Signed)
 I can do May 8 at 830 or 9am  for video. Otherwise can also see an NP, she is a patient of Dr. Billy Bue and Dr. Billy Bue did put a future plan in her note. thanks

## 2023-08-19 NOTE — Telephone Encounter (Signed)
LVM asking pt to call back to reschedule appointment with Dr. Lucia Gaskins

## 2023-08-27 ENCOUNTER — Telehealth: Payer: Self-pay | Admitting: Psychiatry

## 2023-08-27 NOTE — Telephone Encounter (Signed)
 Appointment details confirmed

## 2023-08-29 ENCOUNTER — Other Ambulatory Visit: Payer: Self-pay | Admitting: Neurology

## 2023-08-30 ENCOUNTER — Other Ambulatory Visit: Payer: Self-pay | Admitting: *Deleted

## 2023-09-01 ENCOUNTER — Other Ambulatory Visit: Payer: Self-pay | Admitting: Neurology

## 2023-09-01 DIAGNOSIS — G43719 Chronic migraine without aura, intractable, without status migrainosus: Secondary | ICD-10-CM

## 2023-09-10 ENCOUNTER — Ambulatory Visit (HOSPITAL_COMMUNITY)

## 2023-09-11 ENCOUNTER — Ambulatory Visit (HOSPITAL_COMMUNITY)

## 2023-09-15 ENCOUNTER — Ambulatory Visit (HOSPITAL_COMMUNITY)
Admission: RE | Admit: 2023-09-15 | Discharge: 2023-09-15 | Disposition: A | Discharge status: Home / Self Care | Source: Ambulatory Visit | Attending: Internal Medicine | Admitting: Internal Medicine

## 2023-09-15 ENCOUNTER — Encounter (HOSPITAL_COMMUNITY): Payer: Self-pay

## 2023-09-15 ENCOUNTER — Other Ambulatory Visit: Payer: Self-pay

## 2023-09-15 VITALS — BP 124/80 | HR 94 | Temp 97.9°F | Resp 18

## 2023-09-15 DIAGNOSIS — L272 Dermatitis due to ingested food: Secondary | ICD-10-CM | POA: Diagnosis not present

## 2023-09-15 MED ORDER — DEXAMETHASONE SODIUM PHOSPHATE 10 MG/ML IJ SOLN
10.0000 mg | Freq: Once | INTRAMUSCULAR | Status: AC
  Administered 2023-09-15: 10 mg via INTRAMUSCULAR

## 2023-09-15 MED ORDER — DEXAMETHASONE SODIUM PHOSPHATE 10 MG/ML IJ SOLN
INTRAMUSCULAR | Status: AC
  Filled 2023-09-15: qty 1

## 2023-09-15 NOTE — ED Triage Notes (Signed)
 Irritated and itchy skin.  Patient has used coco butter on these areas  Patient relates this issue to a food item she had on monday

## 2023-09-15 NOTE — ED Provider Notes (Signed)
 [facility]    CSN: [id number] Arrival date & time: 09/15/23  1516      History   Chief Complaint Chief Complaint  Patient presents with   Appointment    3:30    HPI Christine Vega is a 53 y.o. female.   53 year old female who presents urgent care with complaints of an allergic reaction on her skin on her face.  She reports that on Monday she thinks she ate something that she was allergic to.  She began having a rash and irritation on her face.  The areas are very itchy.  She has been using cocoa butter but it has not helped.  She does not has any tongue swelling, mouth swelling, difficulty breathing, shortness of breath, cough.  She does relate that she has had some ringing in her ears that has been going on for quite some time.  She did have her ears cleaned out recently to see if this would help but she continues to have some ringing and wanted to make sure her ears were not clogged up again.  She denies any loss of hearing.  She denies any fevers or chills.     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, walks/balance ok   Post traumatic stress disorder (PTSD)    Seizures (HCC) 2004   x 1; unknown source   Uterine leiomyoma     Patient Active Problem List   Diagnosis Date Noted   Nasal turbinate hypertrophy 05/14/2017   Iron deficiency anemia due to chronic blood loss 04/05/2016   Cervical high risk HPV (human papillomavirus) test positive, normal cytology on 02/24/14 03/01/2014   Atypical migraine 06/24/2013   Migraine without aura 06/24/2013   Vaginal  pain 12/01/2010    Past Surgical History:  Procedure Laterality Date   CHOLECYSTECTOMY     HERNIA REPAIR     HYSTERECTOMY ABDOMINAL WITH SALPINGECTOMY Left 02/07/2016   Procedure: HYSTERECTOMY ABDOMINAL WITH  left SALPINGECTOMY;  Surgeon: Verlyn Goad, MD;  Location: [facility];  Service: Gynecology;  Laterality: Left;   LAPAROSCOPY  05/16/2011   Procedure: LAPAROSCOPY OPERATIVE;  Surgeon: Onnie Bilis, MD;  Location: [facility];  Service: Gynecology;  Laterality: N/A;  poss oophorectomy   LAPAROSCOPY  08/06/2011   Procedure: LAPAROSCOPY OPERATIVE;  Surgeon: Tresia Fruit, MD;  Location: [facility];  Service: Gynecology;  Laterality: N/A;   LAPAROTOMY     for endometriosis/adhesions   SALPINGOOPHORECTOMY  08/06/2011   Procedure: SALPINGO OOPHERECTOMY;  Surgeon: Tresia Fruit, MD;  Location: [facility];  Service: Gynecology;  Laterality: Right;   SVD     x 2   TUBAL LIGATION     WISDOM TOOTH EXTRACTION      OB History     Gravida  2   Para  2   Term  2   Preterm  0   AB  0   Living  2  SAB  0   IAB  0   Ectopic  0   Multiple  0   Live Births  2            Home Medications    Prior to Admission medications   Medication Sig Start Date End Date Taking? Authorizing Provider  buPROPion  (WELLBUTRIN  XL) 150 MG 24 hr tablet Take 150 mg by mouth daily.    [provider]  cetirizine  (ZYRTEC  ALLERGY) 10 MG tablet Take 1 tablet (10 mg total) by mouth daily. 08/04/22   Rising, Ivette Marks, PA-C  clindamycin  (CLEOCIN ) 150 MG capsule Take 1 capsule (150 mg total) by mouth every 6 (six) hours. 08/11/23   Elisa Guest, PA-C  famotidine  (PEPCID ) 20 MG tablet Take 1 tablet (20 mg total) by mouth at bedtime. 08/04/22   Rising, Ivette Marks, PA-C  fluticasone  (FLONASE ) 50 MCG/ACT nasal spray Place 2 sprays into both nostrils daily. 05/20/23   Rising, Ivette Marks, PA-C  Fremanezumab -vfrm ([name] ) 225 MG/1.5ML SOSY Inject 225 mg into the skin every 30 (thirty) days. Must keep follow up  12/12/23 for ongoing refills 08/30/23   Glory Larsen, MD  gabapentin  (NEURONTIN ) 300 MG capsule Take 300 mg (1 pill) in AM, 1 pill in afternoon, and 600 mg (2 pills) in PM for one week. Then take 2 pills in AM, 1 pill in afternoon, and 2 pills in PM for 1 week. Then take 600 mg (2 pills) three times a day. Patient taking differently: Take 300 mg by mouth 2 (two) times daily. 12/19/22   Dala Dublin, MD  HYDROcodone -acetaminophen  (NORCO) 10-325 MG tablet Take 1 tablet by mouth every 6 (six) hours as needed for moderate pain.    [provider]  hydrocortisone  (ANUSOL -HC) 25 MG suppository Place 1 suppository (25 mg total) rectally at bedtime. 10/11/22   Corine Dice, MD  hydrOXYzine  (ATARAX ) 50 MG tablet Take 50 mg by mouth daily. 01/03/23   [provider]  lamoTRIgine (LAMICTAL) 150 MG tablet Take 150 mg by mouth 2 (two) times daily. 01/03/23   [provider]  Lasmiditan  Succinate (REYVOW ) 100 MG TABS Take 1 tablet (100 mg total) by mouth as needed (for migraine). 03/28/23   Athar, Saima, MD  loratadine (CLARITIN) 10 MG tablet Take 10 mg by mouth daily. 01/03/23   [provider]  methocarbamol  (ROBAXIN ) 500 MG tablet Take 1 tablet (500 mg total) by mouth 2 (two) times daily. 03/20/23   Harlow Lighter, [place]  N, FNP  omeprazole (PRILOSEC) 40 MG capsule Take 40 mg by mouth daily. 11/09/19   [provider]  prochlorperazine (COMPAZINE) 10 MG tablet Take 10 mg by mouth every 6 (six) hours as needed for nausea or vomiting.    [provider]  promethazine  (PHENERGAN ) 50 MG suppository Place 50 mg rectally every 8 (eight) hours as needed for nausea or vomiting.    [provider]  propranolol ER (INDERAL LA) 120 MG 24 hr capsule Take 120 mg by mouth daily.    [provider]  sertraline  (ZOLOFT ) 100 MG tablet Take 100 mg by mouth daily. 07/25/11   [provider]  simvastatin (ZOCOR) 40 MG tablet Take 40 mg by mouth daily.  08/15/19   [provider]  tiZANidine (ZANAFLEX) 4 MG tablet Take 4 mg by mouth every 12 (twelve) hours as needed for muscle spasms.     [provider]  VRAYLAR 1.5 MG capsule Take 1.5 mg by mouth daily. 01/03/23   [provider]  ZAVZPRET  10 MG/ACT SOLN PLACE 1 SPRAY INTO THE NOSE AS NEEDED (FOR MIGRAINE. MAX DAILY 1 SPRAY IN 24 HOURS) 09/02/23   Phebe Brasil, MD  zonisamide  (ZONEGRAN ) 100 MG capsule Take 100 mg by mouth daily.    [provider]    Family History Family History  Problem Relation Age of Onset   Migraines Mother    Heart disease Father    Migraines Father    Migraines Daughter    Breast cancer Neg Hx     Social History Social History   Tobacco Use   Smoking status: Never   Smokeless tobacco: Never  Vaping Use   Vaping status: Never Used  Substance Use Topics   Alcohol use: No    Alcohol/week: 0.0 standard drinks of alcohol   Drug use: No     Allergies   Penicillins, Lipitor [atorvastatin calcium], Mushroom extract complex (obsolete), Spinach, and Zithromax [azithromycin]   Review of Systems Review of Systems  Constitutional:  Negative for chills and fever.  HENT:  Negative for ear pain and sore throat.   Eyes:  Negative for pain and visual disturbance.  Respiratory:  Negative for cough and shortness of breath.   Cardiovascular:  Negative for chest pain and palpitations.  Gastrointestinal:  Negative for abdominal pain and vomiting.  Genitourinary:  Negative for dysuria and hematuria.  Musculoskeletal:  Negative for arthralgias and back pain.  Skin:  Positive for rash. Negative for color change.  Neurological:  Negative for seizures and syncope.  All other systems reviewed and are negative.    Physical Exam Triage Vital Signs ED Triage Vitals  Encounter Vitals Group     BP 09/15/23 1544 124/80     Systolic BP Percentile --      Diastolic BP Percentile --      Pulse Rate 09/15/23 1544 94     Resp 09/15/23 1544  18     Temp 09/15/23 1544 97.9 F (36.6 C)     Temp Source 09/15/23 1544 Oral     SpO2 09/15/23 1544 98 %     Weight --      Height --      Head Circumference --      Peak Flow --      Pain Score 09/15/23 1541 0     Pain Loc --      Pain Education --      Exclude from Growth Chart --    No data found.  Updated Vital Signs BP 124/80 (BP Location: Right Arm)   Pulse 94   Temp 97.9 F (36.6 C) (Oral)   Resp 18   LMP 01/13/2016 (Exact Date)   SpO2 98%   Visual Acuity Right Eye Distance:   Left Eye Distance:   Bilateral Distance:    Right Eye Near:   Left Eye Near:    Bilateral Near:     Physical Exam Vitals and nursing note reviewed.  Constitutional:      General: She is not in acute distress.    Appearance: She is well-developed.  HENT:     Head: Normocephalic and atraumatic.     Comments: Bilateral cheeks and forehead with mild urticarial rash, almost sandpaper feeling.  No pigmentation changes Eyes:     Conjunctiva/sclera: Conjunctivae normal.  Cardiovascular:     Rate and Rhythm: Normal rate and regular rhythm.     Heart sounds: No murmur heard. Pulmonary:     Effort: Pulmonary effort is normal. No respiratory distress.  Breath sounds: Normal breath sounds.  Abdominal:     Palpations: Abdomen is soft.     Tenderness: There is no abdominal tenderness.  Musculoskeletal:        General: No swelling.     Cervical back: Neck supple.  Skin:    General: Skin is warm and dry.     Capillary Refill: Capillary refill takes less than 2 seconds.  Neurological:     Mental Status: She is alert.  Psychiatric:        Mood and Affect: Mood normal.      UC Treatments / Results  Labs (all labs ordered are listed, but only abnormal results are displayed) Labs Reviewed - No data to display  EKG   Radiology No results found.  Procedures Procedures (including critical care time)  Medications Ordered in UC Medications  dexamethasone  (DECADRON ) injection 10  mg (has no administration in time range)    Initial Impression / Assessment and Plan / UC Course  I have reviewed the triage vital signs and the nursing notes.  Pertinent labs & imaging results that were available during my care of the patient were reviewed by me and considered in my medical decision making (see chart for details).     Allergic dermatitis due ingested food   Symptoms and physical exam findings are consistent with some type of allergic dermatitis.  This could be associated with a food but cannot rule out a contact dermatitis.  Given the level of itching that is present and the involvement of the face we will treat with Decadron  injection here.  The patient does not wish to do Medrol as she has never had this before and was concerned she may have a reaction.  She also declined topical steroids due to concerns for pigmentation changes.  She has declined a prescription for prednisone  after the injection as she has this at home.  Advised her that appropriate use of the prednisone  would be 40 mg daily for 5 days starting the day after the injection.  Monitor the area and if there is any worsening then recommend going to the hospital for further evaluation.  Otherwise return to urgent care as needed.  Final Clinical Impressions(s) / UC Diagnoses   Final diagnoses:  Allergic dermatitis due ingested food   Discharge Instructions   None    ED Prescriptions   None    PDMP not reviewed this encounter.   Kreg Pesa, [place] 09/15/23 1606

## 2023-09-15 NOTE — Discharge Instructions (Signed)
 Symptoms and physical exam findings are most consistent with an allergic reaction causing dermatitis on the face.  This may have been due to a food source but environmental could also be possible.  We have given an injection of Decadron  today.  Ideally a course of steroids by mouth would help to ensure the reaction is treated.  Would recommend prednisone  40 mg daily for 5 days starting tomorrow 6/2.  If you develop worsening rash, swelling of the face, shortness of breath, swelling in the mouth then recommend going to the emergency department for further evaluation.

## 2023-09-28 ENCOUNTER — Ambulatory Visit (HOSPITAL_COMMUNITY)

## 2023-09-29 ENCOUNTER — Encounter (HOSPITAL_COMMUNITY): Payer: Self-pay

## 2023-09-29 ENCOUNTER — Ambulatory Visit (HOSPITAL_COMMUNITY)
Admission: RE | Admit: 2023-09-29 | Discharge: 2023-09-29 | Discharge status: Home / Self Care | Source: Ambulatory Visit | Attending: Family Medicine | Admitting: Family Medicine

## 2023-09-29 ENCOUNTER — Ambulatory Visit (HOSPITAL_COMMUNITY)

## 2023-09-29 VITALS — BP 123/77 | HR 86 | Temp 98.3°F | Resp 18

## 2023-09-29 DIAGNOSIS — N760 Acute vaginitis: Secondary | ICD-10-CM | POA: Diagnosis not present

## 2023-09-29 DIAGNOSIS — R21 Rash and other nonspecific skin eruption: Secondary | ICD-10-CM | POA: Diagnosis not present

## 2023-09-29 DIAGNOSIS — K5900 Constipation, unspecified: Secondary | ICD-10-CM | POA: Diagnosis not present

## 2023-09-29 MED ORDER — POLYETHYLENE GLYCOL 3350 17 GM/SCOOP PO POWD: ORAL | 0 refills | Status: DC

## 2023-09-29 MED ORDER — FLUCONAZOLE 150 MG PO TABS: 150.0000 mg | ORAL_TABLET | Freq: Once | ORAL | 0 refills | Status: AC

## 2023-09-29 MED ORDER — TRIAMCINOLONE ACETONIDE 0.1 % EX CREA: TOPICAL_CREAM | CUTANEOUS | 0 refills | Status: DC

## 2023-09-29 NOTE — ED Triage Notes (Signed)
 Patient states rash on her face x 1 week.  Patient reports she is constipated and would like suggestion on what to take.

## 2023-09-29 NOTE — Discharge Instructions (Signed)
 Nice meeting you. Please use Triamcinolone  prn facial rash for 1 week. Return soon if there is no improvement. Use Miralax as needed for constipation and follow up soon with PCP to ensure you are up to date with your colon cancer screen. I sent in Diflucan  for presumed yeast infection. Please return soon if there is no improvement. In that case, I will strongly recommend vaginal exam.

## 2023-09-29 NOTE — ED Provider Notes (Signed)
 [facility]    CSN: [id number] Arrival date & time: 09/29/23  1247      History   Chief Complaint Chief Complaint  Patient presents with   Constipation    HPI Christine Vega is a 53 y.o. female.   The history is provided by the patient. No language interpreter was used.  Constipation Severity:  Moderate (Hx of IBS, issues with bowel movement x 3 days. Stool is hard and makes her strain a lot pushing out her hemorrhoid. Sometimes, she will not move her bowel for days, sometimes she can move her bowel regularly) Time since last bowel movement:  3 days Progression:  Waxing and waning Chronicity:  Recurrent Context: not dehydration, not dietary changes and not medication   Stool description:  Hard (No blood in the stool) Associated symptoms: no abdominal pain, no dysuria and no fever   Rash Location:  Face Facial rash location: Rash on her cheeks B/L. Quality: not itchy   Quality comment:  Rash was previously itchy but better now. Rash is clearing too Duration:  2 weeks Progression:  Improving Chronicity:  New Context: not animal contact   Context comment:  She has bad allergy and said she came in contact with something but uncertain Relieved by: Warm water and cocoabutter. Worsened by:  Nothing Associated symptoms: no abdominal pain and no fever   Vaginal Discharge Vaginal discharge characteristics: Thick vaginal discharge with itchiness. She said she just completed antibiotic and usually have yeast infection after antibiotic treatment. Associated symptoms: vaginal itching   Associated symptoms: no abdominal pain, no dysuria, no fever and no urinary frequency   Associated symptoms comment:  No sexual activity x 4 years   Past Medical History:  Diagnosis Date   Anemia    history    Anxiety    Back pain    rotates to left hip and tailbone   Depression    DUB (dysfunctional uterine bleeding) 12/01/2010   Endometriosis    Fractured coccyx (HCC)    Heart  murmur    no problems   History of seasonal allergies    Hyperlipidemia    Migraine    Migraines    tx w/depakote /verapamil  per pt   Murmur, heart    Neuromuscular disorder (HCC)    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, walks/balance ok   Post traumatic stress disorder (PTSD)    Seizures (HCC) 2004   x 1; unknown source   Uterine leiomyoma     Patient Active Problem List   Diagnosis Date Noted   Nasal turbinate hypertrophy 05/14/2017   Iron deficiency anemia due to chronic blood loss 04/05/2016   Cervical high risk HPV (human papillomavirus) test positive, normal cytology on 02/24/14 03/01/2014   Atypical migraine 06/24/2013   Migraine without aura 06/24/2013   Vaginal pain 12/01/2010    Past Surgical History:  Procedure Laterality Date   CHOLECYSTECTOMY     HERNIA REPAIR     HYSTERECTOMY ABDOMINAL WITH SALPINGECTOMY Left 02/07/2016   Procedure: HYSTERECTOMY ABDOMINAL WITH  left SALPINGECTOMY;  Surgeon: Verlyn Goad, MD;  Location: [facility];  Service: Gynecology;  Laterality: Left;   LAPAROSCOPY  05/16/2011   Procedure: LAPAROSCOPY OPERATIVE;  Surgeon: Onnie Bilis, MD;  Location: [facility];  Service: Gynecology;  Laterality: N/A;  poss oophorectomy   LAPAROSCOPY  08/06/2011   Procedure: LAPAROSCOPY OPERATIVE;  Surgeon: Tresia Fruit, MD;  Location: [facility];  Service: Gynecology;  Laterality: N/A;   LAPAROTOMY     for endometriosis/adhesions  SALPINGOOPHORECTOMY  08/06/2011   Procedure: SALPINGO OOPHERECTOMY;  Surgeon: Tresia Fruit, MD;  Location: [facility];  Service: Gynecology;  Laterality: Right;   SVD     x 2   TUBAL LIGATION     WISDOM TOOTH EXTRACTION      OB History     Gravida  2   Para  2   Term  2   Preterm  0   AB  0   Living  2      SAB  0   IAB  0   Ectopic  0   Multiple  0   Live Births  2            Home Medications    Prior to Admission medications   Medication Sig Start Date End Date Taking? Authorizing Provider  buPROPion   (WELLBUTRIN  XL) 150 MG 24 hr tablet Take 150 mg by mouth daily.   Yes [provider]  cetirizine  (ZYRTEC  ALLERGY) 10 MG tablet Take 1 tablet (10 mg total) by mouth daily. 08/04/22  Yes Rising, Ivette Marks, PA-C  clindamycin  (CLEOCIN ) 150 MG capsule Take 1 capsule (150 mg total) by mouth every 6 (six) hours. 08/11/23  Yes Elisa Guest, PA-C  fluconazole  (DIFLUCAN ) 150 MG tablet Take 1 tablet (150 mg total) by mouth once for 1 dose. 09/29/23 09/29/23 Yes Arn Lane, MD  fluticasone  (FLONASE ) 50 MCG/ACT nasal spray Place 2 sprays into both nostrils daily. 05/20/23  Yes Rising, Ivette Marks, PA-C  Fremanezumab -vfrm ([name] ) 225 MG/1.5ML SOSY Inject 225 mg into the skin every 30 (thirty) days. Must keep follow up 12/12/23 for ongoing refills 08/30/23  Yes Glory Larsen, MD  gabapentin  (NEURONTIN ) 300 MG capsule Take 300 mg (1 pill) in AM, 1 pill in afternoon, and 600 mg (2 pills) in PM for one week. Then take 2 pills in AM, 1 pill in afternoon, and 2 pills in PM for 1 week. Then take 600 mg (2 pills) three times a day. Patient taking differently: Take 300 mg by mouth 2 (two) times daily. 12/19/22  Yes Dala Dublin, MD  HYDROcodone -acetaminophen  (NORCO) 10-325 MG tablet Take 1 tablet by mouth every 6 (six) hours as needed for moderate pain.   Yes [provider]  lamoTRIgine (LAMICTAL) 150 MG tablet Take 150 mg by mouth 2 (two) times daily. 01/03/23  Yes [provider]  Lasmiditan  Succinate (REYVOW ) 100 MG TABS Take 1 tablet (100 mg total) by mouth as needed (for migraine). 03/28/23  Yes Athar, Saima, MD  loratadine (CLARITIN) 10 MG tablet Take 10 mg by mouth daily. 01/03/23  Yes [provider]  polyethylene glycol powder (MIRALAX) 17 GM/SCOOP powder Take one scoop daily as needed for constipation 09/29/23  Yes Ysabella Babiarz, Raye Cai T, MD  prochlorperazine (COMPAZINE) 10 MG tablet Take 10 mg by mouth every 6 (six) hours as needed for nausea or vomiting.   Yes [provider]  promethazine  (PHENERGAN ) 50 MG suppository Place 50 mg rectally every 8 (eight) hours as needed for nausea or vomiting.   Yes [provider]  propranolol ER (INDERAL LA) 120 MG 24 hr capsule Take 120 mg by mouth daily.   Yes [provider]  sertraline  (ZOLOFT ) 100 MG tablet Take 100 mg by mouth daily. 07/25/11  Yes [provider]  simvastatin (ZOCOR) 40 MG tablet Take 40 mg by mouth daily. 08/15/19  Yes [provider]  triamcinolone  cream (KENALOG ) 0.1 % Use for facial rash as needed for 1-2  weeks. 09/29/23  Yes Arn Lane, MD  VRAYLAR 1.5 MG capsule Take 1.5 mg by mouth daily. 01/03/23  Yes [provider]  ZAVZPRET  10 MG/ACT SOLN PLACE 1 SPRAY INTO THE NOSE AS NEEDED (FOR MIGRAINE. MAX DAILY 1 SPRAY IN 24 HOURS) 09/02/23  Yes Phebe Brasil, MD  zonisamide  (ZONEGRAN ) 100 MG capsule Take 100 mg by mouth daily.   Yes [provider]  famotidine  (PEPCID ) 20 MG tablet Take 1 tablet (20 mg total) by mouth at bedtime. 08/04/22   Rising, Ivette Marks, PA-C  hydrocortisone  (ANUSOL -HC) 25 MG suppository Place 1 suppository (25 mg total) rectally at bedtime. 10/11/22   Corine Dice, MD  hydrOXYzine  (ATARAX ) 50 MG tablet Take 50 mg by mouth daily. 01/03/23   [provider]  methocarbamol  (ROBAXIN ) 500 MG tablet Take 1 tablet (500 mg total) by mouth 2 (two) times daily. 03/20/23   Harlow Lighter, [place]  N, FNP  omeprazole (PRILOSEC) 40 MG capsule Take 40 mg by mouth daily. 11/09/19   [provider]  tiZANidine (ZANAFLEX) 4 MG tablet Take 4 mg by mouth every 12 (twelve) hours as needed for muscle spasms.     [provider]    Family History Family History  Problem Relation Age of Onset   Migraines Mother    Heart disease Father    Migraines Father    Migraines Daughter    Breast cancer Neg Hx     Social History Social History   Tobacco Use   Smoking status: Never   Smokeless tobacco: Never  Vaping Use   Vaping  status: Never Used  Substance Use Topics   Alcohol use: No    Alcohol/week: 0.0 standard drinks of alcohol   Drug use: No     Allergies   Penicillins, Lipitor [atorvastatin calcium], Mushroom extract complex (obsolete), Spinach, and Zithromax [azithromycin]   Review of Systems Review of Systems  Constitutional:  Negative for fever.  Gastrointestinal:  Positive for constipation. Negative for abdominal pain.  Genitourinary:  Positive for vaginal discharge. Negative for dysuria.  Skin:  Positive for rash.  All other systems reviewed and are negative.    Physical Exam Triage Vital Signs ED Triage Vitals  Encounter Vitals Group     BP 09/29/23 1303 123/77     Girls Systolic BP Percentile --      Girls Diastolic BP Percentile --      Boys Systolic BP Percentile --      Boys Diastolic BP Percentile --      Pulse Rate 09/29/23 1303 86     Resp 09/29/23 1303 18     Temp 09/29/23 1303 98.3 F (36.8 C)     Temp Source 09/29/23 1303 Oral     SpO2 09/29/23 1303 97 %     Weight --      Height --      Head Circumference --      Peak Flow --      Pain Score 09/29/23 1305 0     Pain Loc --      Pain Education --      Exclude from Growth Chart --    No data found.  Updated Vital Signs BP 123/77 (BP Location: Right Arm)   Pulse 86   Temp 98.3 F (36.8 C) (Oral)   Resp 18   LMP 01/13/2016 (Exact Date)   SpO2 97%   Visual Acuity Right Eye Distance:   Left Eye Distance:   Bilateral Distance:    Right  Eye Near:   Left Eye Near:    Bilateral Near:     Physical Exam Vitals reviewed.   Cardiovascular:     Rate and Rhythm: Normal rate and regular rhythm.     Heart sounds: Normal heart sounds. No murmur heard. Pulmonary:     Effort: Pulmonary effort is normal. No respiratory distress.     Breath sounds: Normal breath sounds. No wheezing or rhonchi.  Abdominal:     General: Abdomen is flat. There is no distension.     Palpations: Abdomen is soft. There is no mass.      Tenderness: There is no abdominal tenderness.     Hernia: No hernia is present.  Genitourinary:    Comments: She declined pelvic exam or self swab. She said she already knows what she has given recent A/B intake  Skin:    Comments: Scanty, mildly hyperpigmented maculopapular facial lesion on both side of her cheeks;      UC Treatments / Results  Labs (all labs ordered are listed, but only abnormal results are displayed) Labs Reviewed - No data to display  EKG   Radiology No results found.  Procedures Procedures (including critical care time)  Medications Ordered in UC Medications - No data to display  Initial Impression / Assessment and Plan / UC Course  I have reviewed the triage vital signs and the nursing notes.  Pertinent labs & imaging results that were available during my care of the patient were reviewed by me and considered in my medical decision making (see chart for details).  Clinical Course as of 09/29/23 1345  Sun Sep 29, 2023  1344 Facial rash ?? Allergic dermatitis Topical triamcinolone  escribed Return precautions discussed [KE]  1344 Benign GI exam Miralax escribed  Discussed needed to f/u with PCP for monitoring and to ensure updated colon cancer screen F/U as needed [KE]  1344 Presumed candida vaginitis Diflucan  escribed F/U as needed [KE]    Clinical Course User Index [KE] Arn Lane, MD     Final Clinical Impressions(s) / UC Diagnoses   Final diagnoses:  Facial rash  Vaginitis and vulvovaginitis  Constipation, unspecified constipation type     Discharge Instructions      Nice meeting you. Please use Triamcinolone  prn facial rash for 1 week. Return soon if there is no improvement. Use Miralax as needed for constipation and follow up soon with PCP to ensure you are up to date with your colon cancer screen. I sent in Diflucan  for presumed yeast infection. Please return soon if there is no improvement. In that case, I will  strongly recommend vaginal exam.        ED Prescriptions     Medication Sig Dispense Auth. Provider   polyethylene glycol powder (MIRALAX) 17 GM/SCOOP powder Take one scoop daily as needed for constipation 255 g Penni Bowman T, MD   triamcinolone  cream (KENALOG ) 0.1 % Use for facial rash as needed for 1-2 weeks. 30 g Penni Bowman T, MD   fluconazole  (DIFLUCAN ) 150 MG tablet Take 1 tablet (150 mg total) by mouth once for 1 dose. 1 tablet Arn Lane, MD      PDMP not reviewed this encounter.   Arn Lane, MD 09/29/23 1345

## 2023-09-30 ENCOUNTER — Ambulatory Visit (HOSPITAL_COMMUNITY)
Admission: RE | Admit: 2023-09-30 | Discharge: 2023-09-30 | Disposition: A | Discharge status: Home / Self Care | Source: Ambulatory Visit | Attending: Internal Medicine | Admitting: Internal Medicine

## 2023-09-30 ENCOUNTER — Encounter (HOSPITAL_COMMUNITY): Payer: Self-pay

## 2023-09-30 VITALS — BP 111/73 | HR 88 | Temp 98.3°F | Resp 18

## 2023-09-30 DIAGNOSIS — N760 Acute vaginitis: Secondary | ICD-10-CM | POA: Insufficient documentation

## 2023-09-30 DIAGNOSIS — K644 Residual hemorrhoidal skin tags: Secondary | ICD-10-CM | POA: Insufficient documentation

## 2023-09-30 DIAGNOSIS — N898 Other specified noninflammatory disorders of vagina: Secondary | ICD-10-CM | POA: Insufficient documentation

## 2023-09-30 DIAGNOSIS — B3731 Acute candidiasis of vulva and vagina: Secondary | ICD-10-CM | POA: Diagnosis not present

## 2023-09-30 DIAGNOSIS — Z113 Encounter for screening for infections with a predominantly sexual mode of transmission: Secondary | ICD-10-CM | POA: Insufficient documentation

## 2023-09-30 DIAGNOSIS — L290 Pruritus ani: Secondary | ICD-10-CM | POA: Diagnosis present

## 2023-09-30 MED ORDER — HYDROCORTISONE (PERIANAL) 2.5 % EX CREA: 1.0000 | TOPICAL_CREAM | Freq: Two times a day (BID) | CUTANEOUS | 0 refills | Status: DC

## 2023-09-30 NOTE — ED Triage Notes (Signed)
 Pt present with c/o anal itching x three days. Pt states she has a hx of hemorrhoids, states she thinks she has one coming in

## 2023-09-30 NOTE — Discharge Instructions (Signed)
Your evaluation has revealed that your symptoms are due to hemorrhoids.  You can purchase and apply preparation H hemorrhoid cream to the rectal area 2 times a day for the next up to 7 days (OTC) and do sitz baths to soothe the area (place a few inches of warm water int he bath tub with some epsom salt and soak). Do not use preparation H cream for longer than 7 days since it is a steroid.  Stay well hydrated and eat a high fiber diet full of fruits, vegetables, and whole grains. Purchase colace stool softener over the counter and take this once daily to soften stools and prevent straining on the toilet. Do not sit on the toilet for longer than 5 minutes at a time as this can worsen hemorrhoids.  If your symptoms do not improve in the next 2-3 days with interventions, please return here or follow-up with PCP. If you develop any new or severe symptoms, please go to the ER for further evaluation. Feel better!

## 2023-09-30 NOTE — ED Provider Notes (Signed)
 [facility]    CSN: [id number] Arrival date & time: 09/30/23  1541      History   Chief Complaint Chief Complaint  Patient presents with   Anal Itching    HPI Christine Vega is a 53 y.o. female.   Patient presents to urgent care for evaluation of rectal itching that started 3 to 4 days ago.  Additionally reports constipation for the last few days with hard stools.  She wonders if this is causing a hemorrhoid to the rectum.  Denies blood/mucus to the stools and blood when wiping after having a bowel movement.  She was seen yesterday for constipation where she was prescribed MiraLAX.  She is taking MiraLAX and was able to have a bowel movement this morning.  Denies recent fever, chills, abdominal pain, nausea, vomiting, diarrhea, and dizziness.  She has not attempted use of any over-the-counter medications to help with hemorrhoid and states she has had hemorrhoids in the past.  She additionally reports vaginal discharge and vaginal itching that started a few days ago.  She was prescribed Diflucan  yesterday to treat empirically for vaginal yeast infection, states this has not had time to help with her symptoms.  She has not been sexually active in the last 3 years and denies concern for STD.  Denies vaginal odor, urinary symptoms, flank pain, and abnormal vaginal bleeding.  Hysterectomy in September 2017, postmenopausal.     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, walks/balance ok   Post traumatic stress disorder (PTSD)    Seizures (HCC) 2004   x 1; unknown source    Uterine leiomyoma     Patient Active Problem List   Diagnosis Date Noted   Nasal turbinate hypertrophy 05/14/2017   Iron deficiency anemia due to chronic blood loss 04/05/2016   Cervical high risk HPV (human papillomavirus) test positive, normal cytology on 02/24/14 03/01/2014   Atypical migraine 06/24/2013   Migraine without aura 06/24/2013   Vaginal pain 12/01/2010    Past Surgical History:  Procedure Laterality Date   CHOLECYSTECTOMY     HERNIA REPAIR     HYSTERECTOMY ABDOMINAL WITH SALPINGECTOMY Left 02/07/2016   Procedure: HYSTERECTOMY ABDOMINAL WITH  left SALPINGECTOMY;  Surgeon: Verlyn Goad, MD;  Location: [facility];  Service: Gynecology;  Laterality: Left;   LAPAROSCOPY  05/16/2011   Procedure: LAPAROSCOPY OPERATIVE;  Surgeon: Onnie Bilis, MD;  Location: [facility];  Service: Gynecology;  Laterality: N/A;  poss oophorectomy   LAPAROSCOPY  08/06/2011   Procedure: LAPAROSCOPY OPERATIVE;  Surgeon: Tresia Fruit, MD;  Location: [facility];  Service: Gynecology;  Laterality: N/A;   LAPAROTOMY     for endometriosis/adhesions   SALPINGOOPHORECTOMY  08/06/2011   Procedure: SALPINGO OOPHERECTOMY;  Surgeon: Tresia Fruit, MD;  Location: [facility];  Service: Gynecology;  Laterality: Right;   SVD     x 2   TUBAL LIGATION     WISDOM TOOTH EXTRACTION  OB History     Gravida  2   Para  2   Term  2   Preterm  0   AB  0   Living  2      SAB  0   IAB  0   Ectopic  0   Multiple  0   Live Births  2            Home Medications    Prior to Admission medications   Medication Sig Start Date End Date Taking? Authorizing Provider  hydrocortisone  (ANUSOL -HC) 2.5 % rectal cream Place 1 Application rectally 2 (two) times daily. 09/30/23  Yes Starlene Eaton, FNP  buPROPion  (WELLBUTRIN  XL) 150 MG 24 hr tablet Take 150 mg by mouth daily.    [provider]  cetirizine  (ZYRTEC  ALLERGY) 10 MG tablet Take 1 tablet (10 mg total) by mouth daily. 08/04/22    Rising, Ivette Marks, PA-C  clindamycin  (CLEOCIN ) 150 MG capsule Take 1 capsule (150 mg total) by mouth every 6 (six) hours. 08/11/23   Elisa Guest, PA-C  famotidine  (PEPCID ) 20 MG tablet Take 1 tablet (20 mg total) by mouth at bedtime. 08/04/22   Rising, Ivette Marks, PA-C  fluticasone  (FLONASE ) 50 MCG/ACT nasal spray Place 2 sprays into both nostrils daily. 05/20/23   Rising, Ivette Marks, PA-C  Fremanezumab -vfrm ([name] ) 225 MG/1.5ML SOSY Inject 225 mg into the skin every 30 (thirty) days. Must keep follow up 12/12/23 for ongoing refills 08/30/23   Glory Larsen, MD  gabapentin  (NEURONTIN ) 300 MG capsule Take 300 mg (1 pill) in AM, 1 pill in afternoon, and 600 mg (2 pills) in PM for one week. Then take 2 pills in AM, 1 pill in afternoon, and 2 pills in PM for 1 week. Then take 600 mg (2 pills) three times a day. Patient taking differently: Take 300 mg by mouth 2 (two) times daily. 12/19/22   Dala Dublin, MD  HYDROcodone -acetaminophen  (NORCO) 10-325 MG tablet Take 1 tablet by mouth every 6 (six) hours as needed for moderate pain.    [provider]  hydrOXYzine  (ATARAX ) 50 MG tablet Take 50 mg by mouth daily. 01/03/23   [provider]  lamoTRIgine (LAMICTAL) 150 MG tablet Take 150 mg by mouth 2 (two) times daily. 01/03/23   [provider]  Lasmiditan  Succinate (REYVOW ) 100 MG TABS Take 1 tablet (100 mg total) by mouth as needed (for migraine). 03/28/23   Athar, Saima, MD  loratadine (CLARITIN) 10 MG tablet Take 10 mg by mouth daily. 01/03/23   [provider]  methocarbamol  (ROBAXIN ) 500 MG tablet Take 1 tablet (500 mg total) by mouth 2 (two) times daily. 03/20/23   Harlow Lighter, [place]  N, FNP  omeprazole (PRILOSEC) 40 MG capsule Take 40 mg by mouth daily. 11/09/19   [provider]  polyethylene glycol powder (MIRALAX) 17 GM/SCOOP powder Take one scoop daily as needed for constipation 09/29/23   Arn Lane, MD  prochlorperazine (COMPAZINE) 10 MG tablet Take 10 mg  by mouth every 6 (six) hours as needed for nausea or vomiting.    [provider]  promethazine  (PHENERGAN ) 50 MG suppository Place 50 mg rectally every 8 (eight) hours as needed for nausea or vomiting.    [provider]  propranolol ER (INDERAL LA) 120 MG 24 hr capsule Take 120 mg by mouth daily.    [provider]  sertraline  (ZOLOFT ) 100 MG tablet Take 100 mg by mouth daily. 07/25/11   [provider]  simvastatin (ZOCOR) 40 MG tablet Take 40 mg by mouth daily. 08/15/19   [provider]  tiZANidine (ZANAFLEX) 4 MG tablet Take 4 mg by mouth every 12 (twelve) hours as needed for muscle spasms.     [provider]  triamcinolone  cream (KENALOG ) 0.1 % Use for facial rash as needed for 1-2 weeks. 09/29/23   Arn Lane, MD  VRAYLAR 1.5 MG capsule Take 1.5 mg by mouth daily. 01/03/23   [provider]  ZAVZPRET  10 MG/ACT SOLN PLACE 1 SPRAY INTO THE NOSE AS NEEDED (FOR MIGRAINE. MAX DAILY 1 SPRAY IN 24 HOURS) 09/02/23   Phebe Brasil, MD  zonisamide  (ZONEGRAN ) 100 MG capsule Take 100 mg by mouth daily.    [provider]    Family History Family History  Problem Relation Age of Onset   Migraines Mother    Heart disease Father    Migraines Father    Migraines Daughter    Breast cancer Neg Hx     Social History Social History   Tobacco Use   Smoking status: Never   Smokeless tobacco: Never  Vaping Use   Vaping status: Never Used  Substance Use Topics   Alcohol use: No    Alcohol/week: 0.0 standard drinks of alcohol   Drug use: No     Allergies   Penicillins, Lipitor [atorvastatin calcium], Mushroom extract complex (obsolete), Spinach, and Zithromax [azithromycin]   Review of Systems Review of Systems Per HPI  Physical Exam Triage Vital Signs ED Triage Vitals [09/30/23 1613]  Encounter Vitals Group     BP 111/73     Girls Systolic BP Percentile      Girls Diastolic BP Percentile      Boys Systolic BP  Percentile      Boys Diastolic BP Percentile      Pulse Rate 88     Resp 18     Temp 98.3 F (36.8 C)     Temp Source Oral     SpO2 99 %     Weight      Height      Head Circumference      Peak Flow      Pain Score      Pain Loc      Pain Education      Exclude from Growth Chart    No data found.  Updated Vital Signs BP 111/73 (BP Location: Right Arm)   Pulse 88   Temp 98.3 F (36.8 C) (Oral)   Resp 18   LMP 01/13/2016 (Exact Date)   SpO2 99%   Visual Acuity Right Eye Distance:   Left Eye Distance:   Bilateral Distance:    Right Eye Near:   Left Eye Near:    Bilateral Near:     Physical Exam Vitals and nursing note reviewed. Exam conducted with a chaperone present Burdette Carolin fields, RN present for GU exam).  Constitutional:      Appearance: She is not ill-appearing or toxic-appearing.  HENT:     Head: Normocephalic and atraumatic.     Right Ear: Hearing and external ear normal.     Left Ear: Hearing and external ear normal.     Nose: Nose normal.     Mouth/Throat:     Lips: Pink.   Eyes:     General: Lids are normal. Vision grossly intact. Gaze aligned appropriately.     Extraocular Movements: Extraocular movements intact.     Conjunctiva/sclera: Conjunctivae normal.   Pulmonary:  Effort: Pulmonary effort is normal.  Genitourinary:    General: Normal vulva.     Exam position: Knee-chest position.     Vagina: Vaginal discharge (Thin white vaginal discharge to the vaginal introitus) present.    Musculoskeletal:     Cervical back: Neck supple.   Skin:    General: Skin is warm and dry.     Capillary Refill: Capillary refill takes less than 2 seconds.     Findings: No rash.   Neurological:     General: No focal deficit present.     Mental Status: She is alert and oriented to person, place, and time. Mental status is at baseline.     Cranial Nerves: No dysarthria or facial asymmetry.   Psychiatric:        Mood and Affect: Mood normal.        Speech:  Speech normal.        Behavior: Behavior normal.        Thought Content: Thought content normal.        Judgment: Judgment normal.      UC Treatments / Results  Labs (all labs ordered are listed, but only abnormal results are displayed) Labs Reviewed  CERVICOVAGINAL ANCILLARY ONLY    EKG   Radiology No results found.  Procedures Procedures (including critical care time)  Medications Ordered in UC Medications - No data to display  Initial Impression / Assessment and Plan / UC Course  I have reviewed the triage vital signs and the nursing notes.  Pertinent labs & imaging results that were available during my care of the patient were reviewed by me and considered in my medical decision making (see chart for details).   1.  Acute vaginitis, vaginal itching Vaginal swab is pending to test for BV and yeast.  Patient treated empirically for yeast vaginitis yesterday with Diflucan .  Advised to monitor symptoms for the next few days as it will likely take a few more days for the Diflucan  to fully work in her system.  2.  External hemorrhoid, anal itching External nonthrombosed hemorrhoid on exam today. Will treat with Preparation H cream twice daily for 7 to 10 days. Reviewed note from previous provider yesterday who prescribed MiraLAX for constipation. Encouraged adequate fiber intake, advised to keep stools the consistency of toothpaste via hydration and MiraLAX with stool softener use daily. May follow-up with PCP as needed.   Counseled patient on potential for adverse effects with medications prescribed/recommended today, strict ER and return-to-clinic precautions discussed, patient verbalized understanding.    Final Clinical Impressions(s) / UC Diagnoses   Final diagnoses:  Acute vaginitis  Vaginal itching  Anal itching  Inflamed external hemorrhoid     Discharge Instructions      Your evaluation has revealed that your symptoms are due to hemorrhoids.  You can  purchase and apply preparation H hemorrhoid cream to the rectal area 2 times a day for the next up to 7 days (OTC) and do sitz baths to soothe the area (place a few inches of warm water int he bath tub with some epsom salt and soak). Do not use preparation H cream for longer than 7 days since it is a steroid.  Stay well hydrated and eat a high fiber diet full of fruits, vegetables, and whole grains. Purchase colace stool softener over the counter and take this once daily to soften stools and prevent straining on the toilet. Do not sit on the toilet for longer than 5 minutes at a  time as this can worsen hemorrhoids.  If your symptoms do not improve in the next 2-3 days with interventions, please return here or follow-up with PCP. If you develop any new or severe symptoms, please go to the ER for further evaluation. Feel better!      ED Prescriptions     Medication Sig Dispense Auth. Provider   hydrocortisone  (ANUSOL -HC) 2.5 % rectal cream Place 1 Application rectally 2 (two) times daily. 30 g Starlene Eaton, FNP      PDMP not reviewed this encounter.   Starlene Eaton, [place] 09/30/23 1732

## 2023-10-01 ENCOUNTER — Ambulatory Visit (HOSPITAL_COMMUNITY): Payer: Self-pay

## 2023-10-01 LAB — CERVICOVAGINAL ANCILLARY ONLY
Bacterial Vaginitis (gardnerella): NEGATIVE
Candida Glabrata: POSITIVE — AB
Candida Vaginitis: NEGATIVE
Comment: NEGATIVE
Comment: NEGATIVE
Comment: NEGATIVE

## 2023-10-01 MED ORDER — FLUCONAZOLE 150 MG PO TABS: 150.0000 mg | ORAL_TABLET | Freq: Once | ORAL | 0 refills | Status: AC

## 2023-10-04 ENCOUNTER — Telehealth (HOSPITAL_COMMUNITY): Payer: Self-pay

## 2023-10-04 NOTE — Telephone Encounter (Signed)
 Patient called regarding lab results and states that she is feeling better after taking the first pill of Diflucan . Patient informed to go ahead and take the second pill since it has been 3 days and to follow up as needed. Patient voiced understanding.

## 2023-10-25 ENCOUNTER — Ambulatory Visit (HOSPITAL_COMMUNITY)

## 2023-10-27 ENCOUNTER — Other Ambulatory Visit: Payer: Self-pay

## 2023-10-27 ENCOUNTER — Encounter (HOSPITAL_COMMUNITY): Payer: Self-pay | Admitting: *Deleted

## 2023-10-27 ENCOUNTER — Emergency Department (HOSPITAL_COMMUNITY)
Admission: EM | Admit: 2023-10-27 | Discharge: 2023-10-27 | Disposition: A | Discharge status: Home / Self Care | Attending: Emergency Medicine | Admitting: Emergency Medicine

## 2023-10-27 DIAGNOSIS — K146 Glossodynia: Secondary | ICD-10-CM | POA: Diagnosis present

## 2023-10-27 MED ORDER — DOXYCYCLINE HYCLATE 100 MG PO TABS
100.0000 mg | ORAL_TABLET | Freq: Once | ORAL | Status: AC
  Administered 2023-10-27: 100 mg via ORAL
  Filled 2023-10-27: qty 1

## 2023-10-27 NOTE — ED Triage Notes (Addendum)
 The pt has congestion in her sinuses sore throay  she just saw her doctor yesterday who diagnosed her with a sinus infection  she reports that she fells bad  lmp none  ringing in her ears over a month   she did not get her  meds that was called in for amoxicillin and other meds  it was not ready when she tried to pick it up

## 2023-10-27 NOTE — ED Provider Notes (Signed)
 [place] EMERGENCY DEPARTMENT AT [facility] Provider Note   CSN: [id number] Arrival date & time: 10/27/23  0036     Patient presents with: Sinusitis   Christine Vega is a 53 y.o. female.   53 year old female presents with concern for inability to get her antibiotic at the pharmacy on Saturday, states should be ready for pick up Sunday. Also bumps on the sides of her tongue and sore throat after eating sauce at [employer]. Also ridges to the maxillary area of her mouth also concern for IBS, taking Colace for bowels.        Prior to Admission medications   Medication Sig Start Date End Date Taking? Authorizing Provider  buPROPion  (WELLBUTRIN  XL) 150 MG 24 hr tablet Take 150 mg by mouth daily.    [provider]  cetirizine  (ZYRTEC  ALLERGY) 10 MG tablet Take 1 tablet (10 mg total) by mouth daily. 08/04/22   Rising, Asberry, PA-C  clindamycin  (CLEOCIN ) 150 MG capsule Take 1 capsule (150 mg total) by mouth every 6 (six) hours. 08/11/23   Logan Ubaldo NOVAK, PA-C  famotidine  (PEPCID ) 20 MG tablet Take 1 tablet (20 mg total) by mouth at bedtime. 08/04/22   Rising, Asberry, PA-C  fluticasone  (FLONASE ) 50 MCG/ACT nasal spray Place 2 sprays into both nostrils daily. 05/20/23   Rising, Asberry, PA-C  Fremanezumab -vfrm ([name] ) 225 MG/1.5ML SOSY Inject 225 mg into the skin every 30 (thirty) days. Must keep follow up 12/12/23 for ongoing refills 08/30/23   Ines Onetha NOVAK, MD  gabapentin  (NEURONTIN ) 300 MG capsule Take 300 mg (1 pill) in AM, 1 pill in afternoon, and 600 mg (2 pills) in PM for one week. Then take 2 pills in AM, 1 pill in afternoon, and 2 pills in PM for 1 week. Then take 600 mg (2 pills) three times a day. Patient taking differently: Take 300 mg by mouth 2 (two) times daily. 12/19/22   Rush Nest, MD  HYDROcodone -acetaminophen  (NORCO) 10-325 MG tablet Take 1 tablet by mouth every 6 (six) hours as needed for moderate pain.    [provider]  hydrocortisone   (ANUSOL -HC) 2.5 % rectal cream Place 1 Application rectally 2 (two) times daily. 09/30/23   Enedelia Dorna HERO, FNP  hydrOXYzine  (ATARAX ) 50 MG tablet Take 50 mg by mouth daily. 01/03/23   [provider]  lamoTRIgine (LAMICTAL) 150 MG tablet Take 150 mg by mouth 2 (two) times daily. 01/03/23   [provider]  Lasmiditan  Succinate (REYVOW ) 100 MG TABS Take 1 tablet (100 mg total) by mouth as needed (for migraine). 03/28/23   Athar, Saima, MD  loratadine (CLARITIN) 10 MG tablet Take 10 mg by mouth daily. 01/03/23   [provider]  methocarbamol  (ROBAXIN ) 500 MG tablet Take 1 tablet (500 mg total) by mouth 2 (two) times daily. 03/20/23   Dreama, [place]  N, FNP  omeprazole (PRILOSEC) 40 MG capsule Take 40 mg by mouth daily. 11/09/19   [provider]  polyethylene glycol powder (MIRALAX ) 17 GM/SCOOP powder Take one scoop daily as needed for constipation 09/29/23   Anders Cumins T, MD  prochlorperazine (COMPAZINE) 10 MG tablet Take 10 mg by mouth every 6 (six) hours as needed for nausea or vomiting.    [provider]  promethazine  (PHENERGAN ) 50 MG suppository Place 50 mg rectally every 8 (eight) hours as needed for nausea or vomiting.    [provider]  propranolol ER (INDERAL LA) 120 MG 24 hr capsule Take 120 mg by  mouth daily.    [provider]  sertraline  (ZOLOFT ) 100 MG tablet Take 100 mg by mouth daily. 07/25/11   [provider]  simvastatin (ZOCOR) 40 MG tablet Take 40 mg by mouth daily. 08/15/19   [provider]  tiZANidine (ZANAFLEX) 4 MG tablet Take 4 mg by mouth every 12 (twelve) hours as needed for muscle spasms.     [provider]  triamcinolone  cream (KENALOG ) 0.1 % Use for facial rash as needed for 1-2 weeks. 09/29/23   Anders Otto DASEN, MD  VRAYLAR 1.5 MG capsule Take 1.5 mg by mouth daily. 01/03/23   [provider]  ZAVZPRET  10 MG/ACT SOLN PLACE 1 SPRAY INTO THE NOSE AS NEEDED (FOR  MIGRAINE. MAX DAILY 1 SPRAY IN 24 HOURS) 09/02/23   Onita Duos, MD  zonisamide  (ZONEGRAN ) 100 MG capsule Take 100 mg by mouth daily.    [provider]    Allergies: Penicillins, Lipitor [atorvastatin calcium], Mushroom extract complex (obsolete), Spinach, and Zithromax [azithromycin]    Review of Systems Negative except as per HPI Updated Vital Signs BP 127/85 (BP Location: Right Arm)   Pulse 90   Temp 98 F (36.7 C)   Resp 18   Ht 5' 11 (1.803 m)   Wt 85.7 kg   LMP 01/13/2016 (Exact Date)   SpO2 98%   BMI 26.35 kg/m   Physical Exam Vitals and nursing note reviewed.  Constitutional:      General: She is not in acute distress.    Appearance: She is well-developed. She is not diaphoretic.  HENT:     Head: Normocephalic and atraumatic.     Jaw: There is normal jaw occlusion.     Mouth/Throat:     Mouth: Mucous membranes are moist.     Dentition: No dental abscesses or gum lesions.     Pharynx: Oropharynx is clear. No oropharyngeal exudate or posterior oropharyngeal erythema.  Pulmonary:     Effort: Pulmonary effort is normal.  Abdominal:     Tenderness: There is no abdominal tenderness.  Skin:    General: Skin is warm and dry.     Findings: No erythema or rash.  Neurological:     Mental Status: She is alert and oriented to person, place, and time.  Psychiatric:        Behavior: Behavior normal.     (all labs ordered are listed, but only abnormal results are displayed) Labs Reviewed - No data to display  EKG: None  Radiology: No results found.   Procedures   Medications Ordered in the ED  doxycycline  (VIBRA -TABS) tablet 100 mg (100 mg Oral Given 10/27/23 0305)                                    Medical Decision Making  53 year old female as above. Tongue appears normal, ridges to maxilla are likely normal anatomy related to her teeth/roots. Advised to follow up with her dentist. Abdomen is soft and non tender. Provided with single dose of abx  as she plans to fill her rx today.      Final diagnoses:  Tongue pain    ED Discharge Orders     None          Beverley Leita DELENA DEVONNA 10/27/23 9681    Jerral Meth, MD 10/27/23 [id number]

## 2023-10-27 NOTE — Discharge Instructions (Addendum)
 Take your medications from your pharmacy as prescribed and follow up with your care team.

## 2023-10-28 ENCOUNTER — Ambulatory Visit (HOSPITAL_COMMUNITY)

## 2023-10-29 ENCOUNTER — Ambulatory Visit (HOSPITAL_COMMUNITY)
Admission: RE | Admit: 2023-10-29 | Discharge: 2023-10-29 | Disposition: A | Discharge status: Home / Self Care | Source: Ambulatory Visit | Attending: Internal Medicine | Admitting: Internal Medicine

## 2023-10-29 ENCOUNTER — Encounter (HOSPITAL_COMMUNITY): Payer: Self-pay

## 2023-10-29 VITALS — BP 118/81 | HR 83 | Temp 98.4°F | Resp 17

## 2023-10-29 DIAGNOSIS — K5900 Constipation, unspecified: Secondary | ICD-10-CM

## 2023-10-29 NOTE — ED Triage Notes (Signed)
 Pt reports has IBS. Repots I need to go. Had small amount couple days ago. Took Colace

## 2023-10-29 NOTE — Discharge Instructions (Signed)
+   Mix 238g bottle of miralax  with 64 oz gatorade.   Drink 8 oz of mixture every 15 minutes until mixture is completely consumed.   Follow up if no improvement or with any further concerns.

## 2023-10-31 ENCOUNTER — Encounter (HOSPITAL_COMMUNITY): Payer: Self-pay

## 2023-10-31 NOTE — ED Provider Notes (Signed)
 [facility]    CSN: [id number] Arrival date & time: 10/29/23  1352      History   Chief Complaint Chief Complaint  Patient presents with   Constipation    HPI Christine Vega is a 53 y.o. female.   Patient presents today for evaluation of constipation.  She reports that she had a small bowel movement a few days ago.  She has been able to pass gas without difficulty.  She did take Colace without resolution.  She has known IBS.  She has not had any vomiting.  She does not report any significant abdominal pain  The history is provided by the patient.  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, walks/balance ok   Post traumatic stress disorder (PTSD)    Seizures (HCC) 2004   x 1; unknown source   Uterine leiomyoma     Patient Active Problem List   Diagnosis Date Noted   Nasal turbinate hypertrophy 05/14/2017   Iron deficiency anemia due to chronic blood loss 04/05/2016   Cervical high risk HPV (human papillomavirus) test positive, normal cytology on 02/24/14 03/01/2014   Atypical migraine 06/24/2013   Migraine without aura 06/24/2013   Vaginal pain 12/01/2010    Past Surgical History:  Procedure Laterality Date   CHOLECYSTECTOMY     HERNIA REPAIR     HYSTERECTOMY ABDOMINAL WITH SALPINGECTOMY Left 02/07/2016   Procedure: HYSTERECTOMY ABDOMINAL WITH  left SALPINGECTOMY;  Surgeon: Winton Felt, MD;  Location: [facility];  Service: Gynecology;  Laterality: Left;    LAPAROSCOPY  05/16/2011   Procedure: LAPAROSCOPY OPERATIVE;  Surgeon: Lynwood Solomons, MD;  Location: [facility];  Service: Gynecology;  Laterality: N/A;  poss oophorectomy   LAPAROSCOPY  08/06/2011   Procedure: LAPAROSCOPY OPERATIVE;  Surgeon: Lynwood KANDICE Solomons, MD;  Location: [facility];  Service: Gynecology;  Laterality: N/A;   LAPAROTOMY     for endometriosis/adhesions   SALPINGOOPHORECTOMY  08/06/2011   Procedure: SALPINGO OOPHERECTOMY;  Surgeon: Lynwood KANDICE Solomons, MD;  Location: [facility];  Service: Gynecology;  Laterality: Right;   SVD     x 2   TUBAL LIGATION     WISDOM TOOTH EXTRACTION      OB History     Gravida  2   Para  2   Term  2   Preterm  0   AB  0   Living  2      SAB  0   IAB  0   Ectopic  0   Multiple  0   Live Births  2            Home Medications    Prior to Admission medications   Medication Sig Start Date End Date Taking? Authorizing Provider  buPROPion  (WELLBUTRIN  XL) 150 MG 24 hr tablet Take 150 mg by mouth daily.  [provider]  cetirizine  (ZYRTEC  ALLERGY) 10 MG tablet Take 1 tablet (10 mg total) by mouth daily. 08/04/22   Rising, Asberry, PA-C  clindamycin  (CLEOCIN ) 150 MG capsule Take 1 capsule (150 mg total) by mouth every 6 (six) hours. 08/11/23   Logan Ubaldo NOVAK, PA-C  famotidine  (PEPCID ) 20 MG tablet Take 1 tablet (20 mg total) by mouth at bedtime. 08/04/22   Rising, Asberry, PA-C  fluticasone  (FLONASE ) 50 MCG/ACT nasal spray Place 2 sprays into both nostrils daily. 05/20/23   Rising, Asberry, PA-C  Fremanezumab -vfrm ([name] ) 225 MG/1.5ML SOSY Inject 225 mg into the skin every 30 (thirty) days. Must keep follow up 12/12/23 for ongoing refills 08/30/23   Ines Onetha NOVAK, MD  gabapentin  (NEURONTIN ) 300 MG capsule Take 300 mg (1 pill) in AM, 1 pill in afternoon, and 600 mg (2 pills) in PM for one week. Then take 2 pills in AM, 1 pill in afternoon, and 2 pills in PM for 1 week. Then take 600 mg (2 pills) three times a day. Patient taking  differently: Take 300 mg by mouth 2 (two) times daily. 12/19/22   Rush Nest, MD  HYDROcodone -acetaminophen  (NORCO) 10-325 MG tablet Take 1 tablet by mouth every 6 (six) hours as needed for moderate pain.    [provider]  hydrocortisone  (ANUSOL -HC) 2.5 % rectal cream Place 1 Application rectally 2 (two) times daily. 09/30/23   Enedelia Dorna HERO, FNP  hydrOXYzine  (ATARAX ) 50 MG tablet Take 50 mg by mouth daily. 01/03/23   [provider]  lamoTRIgine (LAMICTAL) 150 MG tablet Take 150 mg by mouth 2 (two) times daily. 01/03/23   [provider]  Lasmiditan  Succinate (REYVOW ) 100 MG TABS Take 1 tablet (100 mg total) by mouth as needed (for migraine). 03/28/23   Athar, Saima, MD  loratadine (CLARITIN) 10 MG tablet Take 10 mg by mouth daily. 01/03/23   [provider]  methocarbamol  (ROBAXIN ) 500 MG tablet Take 1 tablet (500 mg total) by mouth 2 (two) times daily. 03/20/23   Dreama, [place]  N, FNP  omeprazole (PRILOSEC) 40 MG capsule Take 40 mg by mouth daily. 11/09/19   [provider]  polyethylene glycol powder (MIRALAX ) 17 GM/SCOOP powder Take one scoop daily as needed for constipation 09/29/23   Anders Otto DASEN, MD  prochlorperazine (COMPAZINE) 10 MG tablet Take 10 mg by mouth every 6 (six) hours as needed for nausea or vomiting.    [provider]  promethazine  (PHENERGAN ) 50 MG suppository Place 50 mg rectally every 8 (eight) hours as needed for nausea or vomiting.    [provider]  propranolol ER (INDERAL LA) 120 MG 24 hr capsule Take 120 mg by mouth daily.    [provider]  sertraline  (ZOLOFT ) 100 MG tablet Take 100 mg by mouth daily. 07/25/11   [provider]  simvastatin (ZOCOR) 40 MG tablet Take 40 mg by mouth daily. 08/15/19   [provider]  tiZANidine (ZANAFLEX) 4 MG tablet Take 4 mg by mouth every 12 (twelve) hours as needed for muscle spasms.     [provider]  triamcinolone  cream  (KENALOG ) 0.1 % Use for facial rash as needed for 1-2 weeks. 09/29/23   Anders Otto DASEN, MD  VRAYLAR 1.5 MG capsule Take 1.5 mg by mouth daily. 01/03/23   [provider]  ZAVZPRET  10 MG/ACT SOLN PLACE 1 SPRAY INTO THE NOSE AS NEEDED (FOR MIGRAINE. MAX DAILY 1 SPRAY IN 24 HOURS) 09/02/23   Onita Duos, MD  zonisamide  (ZONEGRAN ) 100 MG capsule Take 100 mg by mouth daily.    [provider]    Family History Family History  Problem Relation Age of Onset   Migraines Mother    Heart disease Father    Migraines Father    Migraines Daughter    Breast cancer Neg Hx     Social History Social History   Tobacco Use   Smoking status: Never   Smokeless tobacco: Never  Vaping Use   Vaping status: Never Used  Substance Use Topics   Alcohol use: No    Alcohol/week: 0.0 standard drinks of alcohol   Drug use: No     Allergies   Penicillins, Atorvastatin calcium, Lipitor [atorvastatin calcium], Mushroom extract complex (obsolete), Spinach, and Zithromax [azithromycin]   Review of Systems Review of Systems  Constitutional:  Negative for chills and fever.  Eyes:  Negative for discharge and redness.  Respiratory:  Negative for shortness of breath.   Gastrointestinal:  Positive for constipation. Negative for abdominal pain, blood in stool, nausea and vomiting.     Physical Exam Triage Vital Signs ED Triage Vitals  Encounter Vitals Group     BP 10/29/23 1406 118/81     Girls Systolic BP Percentile --      Girls Diastolic BP Percentile --      Boys Systolic BP Percentile --      Boys Diastolic BP Percentile --      Pulse Rate 10/29/23 1406 83     Resp 10/29/23 1406 17     Temp 10/29/23 1406 98.4 F (36.9 C)     Temp Source 10/29/23 1406 Oral     SpO2 10/29/23 1406 97 %     Weight --      Height --      Head Circumference --      Peak Flow --      Pain Score 10/29/23 1404 0     Pain Loc --      Pain Education --      Exclude from Growth Chart --    No data  found.  Updated Vital Signs BP 118/81 (BP Location: Right Arm)   Pulse 83   Temp 98.4 F (36.9 C) (Oral)   Resp 17   LMP 01/13/2016 (Exact Date)   SpO2 97%   Visual Acuity Right Eye Distance:   Left Eye Distance:   Bilateral Distance:    Right Eye Near:   Left Eye Near:    Bilateral Near:     Physical Exam Vitals and nursing note reviewed.  Constitutional:      General: She is not in acute distress.    Appearance: Normal appearance. She is not ill-appearing.  HENT:     Head: Normocephalic and atraumatic.  Eyes:     Conjunctiva/sclera: Conjunctivae normal.  Cardiovascular:     Rate and Rhythm: Normal rate and regular rhythm.  Pulmonary:     Effort: Pulmonary effort is normal. No respiratory distress.     Breath sounds: No wheezing, rhonchi or rales.  Abdominal:     General: Abdomen is flat. Bowel sounds are normal. There is no distension.     Palpations: Abdomen is soft.     Tenderness: There is no abdominal tenderness. There is no guarding or rebound.  Neurological:     Mental Status: She is alert.  Psychiatric:        Mood and Affect: Mood normal.        Behavior: Behavior normal.  Thought Content: Thought content normal.      UC Treatments / Results  Labs (all labs ordered are listed, but only abnormal results are displayed) Labs Reviewed - No data to display  EKG   Radiology No results found.  Procedures Procedures (including critical care time)  Medications Ordered in UC Medications - No data to display  Initial Impression / Assessment and Plan / UC Course  I have reviewed the triage vital signs and the nursing notes.  Pertinent labs & imaging results that were available during my care of the patient were reviewed by me and considered in my medical decision making (see chart for details).    Recommended full MiraLAX  cleanout.  Recipe information provided for patient.  Advised she would need to be seen in the emergency department if this  is not helpful.  Patient is agreeable with plan.  Final Clinical Impressions(s) / UC Diagnoses   Final diagnoses:  Constipation, unspecified constipation type     Discharge Instructions      + Mix 238g bottle of miralax  with 64 oz gatorade.   Drink 8 oz of mixture every 15 minutes until mixture is completely consumed.   Follow up if no improvement or with any further concerns.    ED Prescriptions   None    PDMP not reviewed this encounter.   Billy Asberry FALCON, PA-C 10/31/23 1801

## 2023-11-03 ENCOUNTER — Encounter (HOSPITAL_COMMUNITY): Payer: Self-pay | Admitting: *Deleted

## 2023-11-03 ENCOUNTER — Other Ambulatory Visit: Payer: Self-pay

## 2023-11-03 ENCOUNTER — Emergency Department (HOSPITAL_COMMUNITY)

## 2023-11-03 ENCOUNTER — Emergency Department (HOSPITAL_COMMUNITY)
Admission: EM | Admit: 2023-11-03 | Discharge: 2023-11-04 | Disposition: A | Discharge status: Home / Self Care | Attending: Emergency Medicine | Admitting: Emergency Medicine

## 2023-11-03 DIAGNOSIS — R109 Unspecified abdominal pain: Secondary | ICD-10-CM | POA: Diagnosis present

## 2023-11-03 DIAGNOSIS — K5900 Constipation, unspecified: Secondary | ICD-10-CM | POA: Insufficient documentation

## 2023-11-03 LAB — COMPREHENSIVE METABOLIC PANEL WITH GFR
ALT: 13 U/L (ref 0–44)
AST: 19 U/L (ref 15–41)
Albumin: 4.1 g/dL (ref 3.5–5.0)
Alkaline Phosphatase: 59 U/L (ref 38–126)
Anion gap: 10 (ref 5–15)
BUN: 5 mg/dL — ABNORMAL LOW (ref 6–20)
CO2: 23 mmol/L (ref 22–32)
Calcium: 9.4 mg/dL (ref 8.9–10.3)
Chloride: 103 mmol/L (ref 98–111)
Creatinine, Ser: 0.71 mg/dL (ref 0.44–1.00)
GFR, Estimated: >60 mL/min (ref >60)
Glucose, Bld: 117 mg/dL — ABNORMAL HIGH (ref 70–99)
Potassium: 3.8 mmol/L (ref 3.5–5.1)
Sodium: 136 mmol/L (ref 135–145)
Total Bilirubin: 0.7 mg/dL (ref 0.0–1.2)
Total Protein: 7.3 g/dL (ref 6.5–8.1)

## 2023-11-03 LAB — LIPASE, BLOOD: Lipase: 25 U/L (ref 11–51)

## 2023-11-03 LAB — CBC
HCT: 38 % (ref 36.0–46.0)
Hemoglobin: 12.1 g/dL (ref 12.0–15.0)
MCH: 27.3 pg (ref 26.0–34.0)
MCHC: 31.8 g/dL (ref 30.0–36.0)
MCV: 85.8 fL (ref 80.0–100.0)
Platelets: 266 K/uL (ref 150–400)
RBC: 4.43 MIL/uL (ref 3.87–5.11)
RDW: 14.1 % (ref 11.5–15.5)
WBC: 6.8 K/uL (ref 4.0–10.5)
nRBC: 0 % (ref 0.0–0.2)

## 2023-11-03 MED ORDER — IOHEXOL 350 MG/ML SOLN
75.0000 mL | Freq: Once | INTRAVENOUS | Status: AC | PRN
  Administered 2023-11-03: 75 mL via INTRAVENOUS

## 2023-11-03 NOTE — ED Notes (Signed)
 Pt back from CT

## 2023-11-03 NOTE — ED Provider Notes (Signed)
 [facility] Provider Note   CSN: [id number] Arrival date & time: 11/03/23  1831     Patient presents with: Abdominal Pain   Christine Vega is a 53 y.o. female.  Patient with past history significant for migraines, iron deficiency anemia presents emergency department with concerns of abdominal pain to constipation.  Reports very minimal bowel movement about 5 days ago but has not a bowel movement since then.  Endorses history of IBS but not current on any medications.  Has been using MiraLAX  without luck over the last several days for bowel movements.  Denies any significant pain but does have feelings of abdominal bloating and distention.  Denies any nausea or vomiting.   Abdominal Pain      Prior to Admission medications   Medication Sig Start Date End Date Taking? Authorizing Provider  magnesium  citrate SOLN Take 296 mLs (1 Bottle total) by mouth once for 1 dose. 11/04/23 11/04/23 Yes Angela Vazguez, Legrand DELENA, PA-C  sodium phosphate  (FLEET) ENEM Place 133 mLs (1 enema total) rectally once for 1 dose. 11/04/23 11/04/23 Yes Khambrel Amsden, Legrand DELENA, PA-C  buPROPion  (WELLBUTRIN  XL) 150 MG 24 hr tablet Take 150 mg by mouth daily.    [provider]  cetirizine  (ZYRTEC  ALLERGY) 10 MG tablet Take 1 tablet (10 mg total) by mouth daily. 08/04/22   Rising, Asberry, PA-C  clindamycin  (CLEOCIN ) 150 MG capsule Take 1 capsule (150 mg total) by mouth every 6 (six) hours. 08/11/23   Logan Ubaldo NOVAK, PA-C  famotidine  (PEPCID ) 20 MG tablet Take 1 tablet (20 mg total) by mouth at bedtime. 08/04/22   Rising, Asberry, PA-C  fluticasone  (FLONASE ) 50 MCG/ACT nasal spray Place 2 sprays into both nostrils daily. 05/20/23   Rising, Asberry, PA-C  Fremanezumab -vfrm ([name] ) 225 MG/1.5ML SOSY Inject 225 mg into the skin every 30 (thirty) days. Must keep follow up 12/12/23 for ongoing refills 08/30/23   Ines Onetha NOVAK, MD  gabapentin  (NEURONTIN ) 300 MG capsule Take 300 mg (1 pill)  in AM, 1 pill in afternoon, and 600 mg (2 pills) in PM for one week. Then take 2 pills in AM, 1 pill in afternoon, and 2 pills in PM for 1 week. Then take 600 mg (2 pills) three times a day. Patient taking differently: Take 300 mg by mouth 2 (two) times daily. 12/19/22   Rush Nest, MD  HYDROcodone -acetaminophen  (NORCO) 10-325 MG tablet Take 1 tablet by mouth every 6 (six) hours as needed for moderate pain.    [provider]  hydrocortisone  (ANUSOL -HC) 2.5 % rectal cream Place 1 Application rectally 2 (two) times daily. 09/30/23   Enedelia Dorna HERO, FNP  hydrOXYzine  (ATARAX ) 50 MG tablet Take 50 mg by mouth daily. 01/03/23   [provider]  lamoTRIgine (LAMICTAL) 150 MG tablet Take 150 mg by mouth 2 (two) times daily. 01/03/23   [provider]  Lasmiditan  Succinate (REYVOW ) 100 MG TABS Take 1 tablet (100 mg total) by mouth as needed (for migraine). 03/28/23   Athar, Saima, MD  loratadine (CLARITIN) 10 MG tablet Take 10 mg by mouth daily. 01/03/23   [provider]  methocarbamol  (ROBAXIN ) 500 MG tablet Take 1 tablet (500 mg total) by mouth 2 (two) times daily. 03/20/23   Dreama, [place]  N, FNP  omeprazole (PRILOSEC) 40 MG capsule Take 40 mg by mouth daily. 11/09/19   [provider]  polyethylene glycol powder (MIRALAX ) 17 GM/SCOOP powder Take one scoop daily as needed for constipation 09/29/23  Anders Otto DASEN, MD  prochlorperazine (COMPAZINE) 10 MG tablet Take 10 mg by mouth every 6 (six) hours as needed for nausea or vomiting.    [provider]  promethazine  (PHENERGAN ) 50 MG suppository Place 50 mg rectally every 8 (eight) hours as needed for nausea or vomiting.    [provider]  propranolol ER (INDERAL LA) 120 MG 24 hr capsule Take 120 mg by mouth daily.    [provider]  sertraline  (ZOLOFT ) 100 MG tablet Take 100 mg by mouth daily. 07/25/11   [provider]  simvastatin (ZOCOR) 40 MG tablet Take 40 mg  by mouth daily. 08/15/19   [provider]  tiZANidine (ZANAFLEX) 4 MG tablet Take 4 mg by mouth every 12 (twelve) hours as needed for muscle spasms.     [provider]  triamcinolone  cream (KENALOG ) 0.1 % Use for facial rash as needed for 1-2 weeks. 09/29/23   Anders Otto DASEN, MD  VRAYLAR 1.5 MG capsule Take 1.5 mg by mouth daily. 01/03/23   [provider]  ZAVZPRET  10 MG/ACT SOLN PLACE 1 SPRAY INTO THE NOSE AS NEEDED (FOR MIGRAINE. MAX DAILY 1 SPRAY IN 24 HOURS) 09/02/23   Onita Duos, MD  zonisamide  (ZONEGRAN ) 100 MG capsule Take 100 mg by mouth daily.    [provider]    Allergies: Penicillins, Atorvastatin calcium, Lipitor [atorvastatin calcium], Mushroom extract complex (obsolete), Spinach, and Zithromax [azithromycin]    Review of Systems  Gastrointestinal:  Positive for abdominal pain.  All other systems reviewed and are negative.   Updated Vital Signs BP 111/73   Pulse 89   Temp 98.3 F (36.8 C) (Oral)   Resp 18   Ht 5' 11 (1.803 m)   Wt 85.7 kg   LMP 01/13/2016 (Exact Date)   SpO2 98%   BMI 26.35 kg/m   Physical Exam Vitals and nursing note reviewed.  Constitutional:      General: She is not in acute distress.    Appearance: She is well-developed.  HENT:     Head: Normocephalic and atraumatic.  Eyes:     Conjunctiva/sclera: Conjunctivae normal.  Cardiovascular:     Rate and Rhythm: Normal rate and regular rhythm.     Heart sounds: No murmur heard. Pulmonary:     Effort: Pulmonary effort is normal. No respiratory distress.     Breath sounds: Normal breath sounds.  Abdominal:     Palpations: Abdomen is soft.     Tenderness: There is no abdominal tenderness. There is no guarding or rebound. Negative signs include Murphy's sign and McBurney's sign.  Musculoskeletal:        General: No swelling.     Cervical back: Neck supple.  Skin:    General: Skin is warm and dry.     Capillary Refill: Capillary refill takes less than 2  seconds.  Neurological:     Mental Status: She is alert.  Psychiatric:        Mood and Affect: Mood normal.     (all labs ordered are listed, but only abnormal results are displayed) Labs Reviewed  COMPREHENSIVE METABOLIC PANEL WITH GFR - Abnormal; Notable for the following components:      Result Value   Glucose, Bld 117 (*)    BUN 5 (*)    All other components within normal limits  LIPASE, BLOOD  CBC    EKG: None  Radiology: CT ABDOMEN PELVIS W CONTRAST Result Date: 11/03/2023 CLINICAL DATA:  Bowel obstruction suspected.  Constipation EXAM: CT ABDOMEN AND PELVIS WITH CONTRAST TECHNIQUE: Multidetector CT imaging of the abdomen and pelvis was performed using the standard protocol following bolus administration of intravenous contrast. RADIATION DOSE REDUCTION: This exam was performed according to the departmental dose-optimization program which includes automated exposure control, adjustment of the mA and/or kV according to patient size and/or use of iterative reconstruction technique. CONTRAST:  75mL OMNIPAQUE  IOHEXOL  350 MG/ML SOLN COMPARISON:  CT abdomen pelvis 05/07/2018 FINDINGS: Lower chest: No acute abnormality. Hepatobiliary: Interval development of a subcentimeter hypodensity within the right posterior hepatic lobe likely a simple hepatic cyst. Status post cholecystectomy. No biliary dilatation. Pancreas: No focal lesion. Normal pancreatic contour. No surrounding inflammatory changes. No main pancreatic ductal dilatation. Spleen: Normal in size without focal abnormality. Adrenals/Urinary Tract: No adrenal nodule bilaterally. Bilateral kidneys enhance symmetrically. No hydronephrosis. No hydroureter. The urinary bladder is unremarkable. On delayed imaging, there is no urothelial wall thickening and there are no filling defects in the opacified portions of the bilateral collecting systems or ureters. Stomach/Bowel: Stomach is within normal limits. No evidence of bowel wall thickening  or dilatation. Colonic diverticulosis. No increased stool burden. Appendix appears normal. Vascular/Lymphatic: No abdominal aorta or iliac aneurysm. Mild atherosclerotic plaque of the aorta and its branches. No abdominal, pelvic, or inguinal lymphadenopathy. Reproductive: Status post hysterectomy. No adnexal masses. Other: No intraperitoneal free fluid. No intraperitoneal free gas. No organized fluid collection. Musculoskeletal: Lower anterior abdominal ventral wall hernia repair with mesh. No recurrent hernia. No suspicious lytic or blastic osseous lesions. No acute displaced fracture. Bilateral hip severe degenerative changes. IMPRESSION: 1. No acute intra-abdominal or intrapelvic abnormality. 2. Colonic diverticulosis with no acute diverticulitis. 3.  Aortic Atherosclerosis (ICD10-I70.0). Electronically Signed   By: Morgane  Naveau M.D.   On: 11/03/2023 23:55     Procedures   Medications Ordered in the ED  iohexol  (OMNIPAQUE ) 350 MG/ML injection 75 mL (75 mLs Intravenous Contrast Given 11/03/23 2344)  sodium phosphate  (FLEET) enema 1 enema (1 enema Rectal Provided for home use 11/04/23 0011)                                    Medical Decision Making Amount and/or Complexity of Data Reviewed Radiology: ordered.  Risk OTC drugs. Prescription drug management.   This patient presents to the ED for concern of abdominal pain, constipation, this involves an extensive number of treatment options, and is a complaint that carries with it a high risk of complications and morbidity.  The differential diagnosis includes constipation, bowel obstruction, diverticulitis, diverticulosis   Co morbidities that complicate the patient evaluation  IBS, migraines, iron deficiency anemia   Lab Tests:  I Ordered, and personally interpreted labs.  The pertinent results include: CBC unremarkable, CMP unremarkable, lipase unremarkable   Imaging Studies ordered:  I ordered imaging studies including CT  abdomen pelvis I independently visualized and interpreted imaging which showed no acute abdominal abnormality I agree with the radiologist interpretation   Problem List / ED Course / Critical interventions / Medication management  Patient presents emergency department with concern abdominal pain and constipation.  Past history significant for IBS, migraines, iron deficiency anemia.  She reports that she has not a bowel movement in the last 5 days or so.  Last bowel movement was on Tuesday of last week with minimal output.  Denies any nausea, vomiting, or severe abdominal pain.  She is requesting help with her bowel movements.  She has tried Colace and MiraLAX  at home without improvement. On evaluation, abdomen is nondistended nontender.  Normal bowel sounds.  She is able to pass gas without difficulty.  Doubtful of obstruction.  Will proceed basic labs and CT imaging for rule out of obstruction. Lab work is unremarkable.  CT abdomen pelvis negative for any signs of obstruction.  Stool seen throughout the colon. Workup reassuring.  Will provide patient with a Fleet enema for use at home.  Also sending in a prescription for magnesium  citrate for further bowel management.  Encouraged use of fiber, MiraLAX , and adequate hydration once bowel movements become somewhat more regular.  Return precautions discussed.  Stable for outpatient follow-up and discharged home. I ordered medication including Fleet enema for bowel movement Reevaluation of the patient after these medicines showed that the patient stayed the same I have reviewed the patients home medicines and have made adjustments as needed   Test / Admission - Considered:  Stable for outpatient follow-up  Final diagnoses:  Constipation, unspecified constipation type    ED Discharge Orders          Ordered    magnesium  citrate SOLN   Once        11/04/23 0010    sodium phosphate  (FLEET) ENEM   Once        11/04/23 0010                Cecily Legrand LABOR, PA-C 11/04/23 0013    Freddi Hamilton, MD 11/06/23 2242

## 2023-11-03 NOTE — ED Notes (Signed)
Pt reminded about the need for a urine sample.

## 2023-11-03 NOTE — ED Notes (Signed)
 Patient transported to CT

## 2023-11-03 NOTE — ED Triage Notes (Signed)
 The pt is c/o abd pain  for one week also no bm except a small bm on Tuesday  hx of irritable bowel   lmp none

## 2023-11-04 MED ORDER — FLEET ENEMA RE ENEM: 1.0000 | ENEMA | Freq: Once | RECTAL | 0 refills | Status: AC

## 2023-11-04 MED ORDER — FLEET ENEMA RE ENEM
1.0000 | ENEMA | Freq: Once | RECTAL | Status: AC
  Administered 2023-11-04: 1 via RECTAL
  Filled 2023-11-04: qty 1

## 2023-11-04 MED ORDER — MAGNESIUM CITRATE PO SOLN: 1.0000 | Freq: Once | ORAL | 1 refills | Status: AC

## 2023-11-04 NOTE — ED Notes (Signed)
 Reviewed D/C information with the patient, pt verbalized understanding. No additional concerns at this time.

## 2023-11-04 NOTE — Discharge Instructions (Signed)
 You are seen in the emergency department today for concerns of abdominal pain.  You were found to have constipation that is causing the symptoms.  You were given a Fleet enema here in the emergency department for use at home.  Please use this as prescribed.  I will also send you to the prescriptions for another enema as well as a oral solution to help with your bowel movements.  Try to increase your water intake as well as fiber supplementation.  For any concerns or new or worsening symptoms, return to the emergency department.

## 2023-11-26 ENCOUNTER — Telehealth: Payer: Self-pay | Admitting: Neurology

## 2023-11-26 NOTE — Telephone Encounter (Signed)
Patient called to verify appointment 

## 2023-12-12 ENCOUNTER — Encounter: Payer: Self-pay | Admitting: Neurology

## 2023-12-12 ENCOUNTER — Ambulatory Visit: Admitting: Neurology

## 2023-12-12 ENCOUNTER — Ambulatory Visit (HOSPITAL_COMMUNITY)

## 2023-12-12 VITALS — BP 126/85 | HR 91 | Ht 71.5 in | Wt 178.0 lb

## 2023-12-12 DIAGNOSIS — G43711 Chronic migraine without aura, intractable, with status migrainosus: Secondary | ICD-10-CM | POA: Diagnosis not present

## 2023-12-12 MED ORDER — GABAPENTIN 300 MG PO CAPS: 900.0000 mg | ORAL_CAPSULE | Freq: Three times a day (TID) | ORAL | 6 refills | Status: AC

## 2023-12-12 NOTE — Progress Notes (Signed)
 CC:  headaches  Follow-up Visit  Last visit: 12/19/2022 (no showed 2 followups since)  Brief HPI: 53 year old female with a history of depression and anxiety who follows in clinic for migraines.  At her last visit she was started on [name]  for migraine prevention and Zavzpret  for rescue.  Interval History: 12/12/2023: Migraine started 2 days ago. She is still on the [name] . Still having 16-17 migraine days a month. Her daughter sees us , [place].  She takes reyvow  acutely. Gabapentin  helps and she wants to increase this and not start anything new.   She continues to average 16-17 migraines per month. [name]  helps reduce the severity but not frequency of her headaches. Zavzpret  works about as well as Reyvow  did.gabapentin  helps.   Currently stable at the following:  Migraine days per month: 16 Headache free days per month: 14  Imaging: reviewed CT reprt head 12/20/2015 normal(images unavailable) Labs reiewed: reviewed labs: 11/03/2023 cbc nml, cbc elevated glucose but otherwise nml, 06/08/2021 hgba1c 6.7(advised to follow with primary care and manage closely), 04/04/2021 TSH nml  Current Headache Regimen: Preventative: [name]  225 mg monthly Abortive: Zavzpret  10 mg PRN, Reyvow  100 mg PRN Increase Gabapentin   - she wants to increase the gabapentin   From a thorough review of records and patient report, Medications tried that can be used in migraine/headache management greater than 3 months include: Lifestyle modification, headache diaries, better sleep hygiene, exercise, management of migraine triggers, OTC and prescribed analgesics/nsaids such as ibuprofen , excedrin, alleve and others, including the following:  Prior Therapies                                  Preventive: Topamax Zonisamide  100 mg daily Depakote  500 mg BID Gabapentin  300 mg TID Lyrica 25 mg TID Lamictal 100 mg daily Propranolol 120 mg daily Verapamil  120 mg daily Sertraline  100 gm daily Nortriptyline  100 mg  QHS Emgality  120 mg monthly Aimovig  70 mg monthly [name]   Qulipta  60 mg daily Botox  - stopped working   Rescue: Naproxen  Diclofenac Fioricet Imitrex Maxalt Relpax  40 mg PRN Zomig nasal spray 2.5 mg PRN Migranal nasal spray Reyvow  100 mg PRN Nurtec 75 mg PRN Ubrelvy  100 mg PRN Zavzpret  10 mg PRN Tizanidine 4 mg PRN Zofran  4 mg PRN    Physical Exam:   Vitals:   12/12/23 1416  BP: 126/85  Pulse: 91     Physical exam: Exam: Gen: NAD, conversant      CV: No palpitations or chest pain or SOB. VS: Breathing at a normal rate. Not febrile. Eyes: Conjunctivae clear without exudates or hemorrhage  Neuro: Detailed Neurologic Exam  Speech:    Speech is normal; fluent and spontaneous with normal comprehension.  Cognition:    The patient is oriented to person, place, and time;     recent and remote memory intact;     language fluent;     normal attention, concentration, fund of knowledge Cranial Nerves:    The pupils are equal, round, and reactive to light. Visual fields are full Extraocular movements are intact.  The face is symmetric with normal sensation. The palate elevates in the midline. Hearing intact. Voice is normal. Shoulder shrug is normal. The tongue has normal motion without fasciculations.   Coordination: normal  Gait:    No abnormalities noted or reported  Motor Observation:   no involuntary movements noted. Tone:    Appears normal  Posture:    Posture  is normal. normal erect    Strength:    Strength is anti-gravity and symmetric in the upper and lower limbs.      Sensation: intact to LT, no reports of numbness or tingling or paresthesias       IMPRESSION: Chroni Imtractable Migraines 53 year old female with a history of depression and anxiety who presents for follow up of migraines. She has had mild improvement in severity of headaches with [name]  and reyvow , but continues to have ~16 migraines per month. She would like to continue these  medications for now. Will increase gabapentin  and see if this provides any more relief from her headaches.She declines starting anything new at this time.   PLAN: -Prevention: Continue [name]  225 mg monthly for now. Increase gabapentin  -Rescue: continue Zavzpret  10 mg PRN, Reyvow  100mg  qday -Next steps: Consider Vyepti, Namenda, retrial of Botox  (helped a little previously). Discussed device options including Cefaly, nerivio - declined MRI brain   Follow-up: 6 months

## 2023-12-16 ENCOUNTER — Telehealth: Payer: Self-pay | Admitting: Neurology

## 2023-12-16 NOTE — Telephone Encounter (Signed)
 Can you please clal her for a 74-month follow up, can be video, with NP for migraine management please(can see NP)? Thank you

## 2023-12-17 NOTE — Telephone Encounter (Signed)
 Spoke with patient and she stated she just woke up and that she will call back to schedule follow up

## 2023-12-19 ENCOUNTER — Other Ambulatory Visit: Payer: Self-pay | Admitting: Neurology

## 2024-02-01 ENCOUNTER — Encounter (HOSPITAL_COMMUNITY): Payer: Self-pay

## 2024-02-01 ENCOUNTER — Ambulatory Visit (HOSPITAL_COMMUNITY)
Admission: RE | Admit: 2024-02-01 | Discharge: 2024-02-01 | Disposition: A | Discharge status: Home / Self Care | Source: Ambulatory Visit

## 2024-02-01 VITALS — BP 125/75 | HR 88 | Temp 98.3°F | Resp 14

## 2024-02-01 DIAGNOSIS — H6991 Unspecified Eustachian tube disorder, right ear: Secondary | ICD-10-CM | POA: Diagnosis not present

## 2024-02-01 NOTE — ED Provider Notes (Signed)
 [facility]    CSN: [id number] Arrival date & time: 02/01/24  1031      History   Chief Complaint Chief Complaint  Patient presents with   Otalgia    HPI Christine Vega is a 53 y.o. female.   53 year old female presents urgent care with complaints of right ear pain and muffled hearing.  She reports this started about 2 days ago.  She denies any fevers or chills.  She has tried over-the-counter medication without much relief.  She does have severe seasonal allergies.  She has no other associated symptoms.   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, walks/balance ok   Post traumatic stress disorder (PTSD)    Seizures (HCC) 2004   x 1; unknown source   Uterine leiomyoma     Patient Active Problem List   Diagnosis Date Noted   Nasal turbinate hypertrophy 05/14/2017   Iron deficiency anemia due to chronic blood loss 04/05/2016   Cervical high risk HPV (human papillomavirus) test positive, normal cytology on 02/24/14 03/01/2014   Atypical migraine 06/24/2013   Chronic migraine without aura, with intractable migraine, so stated, with status migrainosus 06/24/2013   Vaginal pain 12/01/2010    Past Surgical History:  Procedure Laterality Date   CHOLECYSTECTOMY     HERNIA REPAIR     HYSTERECTOMY ABDOMINAL WITH SALPINGECTOMY Left 02/07/2016   Procedure: HYSTERECTOMY ABDOMINAL WITH  left SALPINGECTOMY;  Surgeon: Winton Felt, MD;  Location: [facility];  Service: Gynecology;  Laterality: Left;   LAPAROSCOPY  05/16/2011   Procedure: LAPAROSCOPY OPERATIVE;  Surgeon: Lynwood Solomons, MD;  Location: [facility];  Service: Gynecology;  Laterality: N/A;  poss oophorectomy   LAPAROSCOPY  08/06/2011   Procedure: LAPAROSCOPY OPERATIVE;  Surgeon: Lynwood KANDICE Solomons, MD;  Location: [facility];  Service: Gynecology;  Laterality: N/A;   LAPAROTOMY     for endometriosis/adhesions   SALPINGOOPHORECTOMY  08/06/2011   Procedure: SALPINGO OOPHERECTOMY;  Surgeon: Lynwood KANDICE Solomons, MD;  Location: [facility];  Service: Gynecology;  Laterality: Right;   SVD     x 2   TUBAL LIGATION     WISDOM TOOTH EXTRACTION      OB History     Gravida  2   Para  2   Term  2   Preterm  0   AB  0   Living  2      SAB  0   IAB  0   Ectopic  0   Multiple  0   Live Births  2            Home Medications    Prior to Admission medications   Medication Sig Start Date End Date Taking? Authorizing Provider  gabapentin  (NEURONTIN ) 300 MG capsule Take 300 mg by mouth 3 (three) times  daily.   Yes [provider]  predniSONE  (DELTASONE ) 20 MG tablet Take 2 tablets (40 mg total) by mouth daily with breakfast for 3 days. 02/01/24 02/04/24 Yes Essam Lowdermilk A, PA-C  buPROPion  (WELLBUTRIN  XL) 150 MG 24 hr tablet Take 150 mg by mouth daily.    [provider]  fluticasone  (FLONASE ) 50 MCG/ACT nasal spray Place 2 sprays into both nostrils daily. 05/20/23   Rising, Asberry, PA-C  Fremanezumab -vfrm ([name] ) 225 MG/1.5ML SOSY INJECT 225MG  SUBCUTANEOUSLY EVERY 30 DAYS. MUST KEEP FOLLOW UP 12/12/23 FOR ONGOING REFILLS 12/24/23   Ines Onetha NOVAK, MD  gabapentin  (NEURONTIN ) 300 MG capsule Take 3 capsules (900 mg total) by mouth 3 (three) times daily. 12/12/23   Ines Onetha NOVAK, MD  hydrOXYzine  (ATARAX ) 50 MG tablet Take 50 mg by mouth daily. 01/03/23   [provider]  lamoTRIgine (LAMICTAL) 150 MG tablet Take 150 mg by mouth 2 (two) times daily. 01/03/23    [provider]  Lasmiditan  Succinate (REYVOW ) 100 MG TABS Take 1 tablet (100 mg total) by mouth as needed (for migraine). 03/28/23   Athar, Saima, MD  loratadine (CLARITIN) 10 MG tablet Take 10 mg by mouth daily. 01/03/23   [provider]  omeprazole (PRILOSEC) 40 MG capsule Take 40 mg by mouth daily. 11/09/19   [provider]  prochlorperazine (COMPAZINE) 10 MG tablet Take 10 mg by mouth every 6 (six) hours as needed for nausea or vomiting.    [provider]  promethazine  (PHENERGAN ) 50 MG suppository Place 50 mg rectally every 8 (eight) hours as needed for nausea or vomiting.    [provider]  propranolol ER (INDERAL LA) 120 MG 24 hr capsule Take 120 mg by mouth daily.    [provider]  sertraline  (ZOLOFT ) 100 MG tablet Take 100 mg by mouth daily. 07/25/11   [provider]  simvastatin (ZOCOR) 40 MG tablet Take 40 mg by mouth daily. 08/15/19   [provider]  tiZANidine (ZANAFLEX) 4 MG tablet Take 4 mg by mouth every 12 (twelve) hours as needed for muscle spasms.     [provider]  triamcinolone  cream (KENALOG ) 0.1 % Use for facial rash as needed for 1-2 weeks. Patient not taking: Reported on 12/12/2023 09/29/23   Anders Otto DASEN, MD  VRAYLAR 1.5 MG capsule Take 1.5 mg by mouth daily. 01/03/23   [provider]  ZAVZPRET  10 MG/ACT SOLN PLACE 1 SPRAY INTO THE NOSE AS NEEDED (FOR MIGRAINE. MAX DAILY 1 SPRAY IN 24 HOURS) 09/02/23   Onita Duos, MD  zonisamide  (ZONEGRAN ) 100 MG capsule Take 100 mg by mouth daily.    [provider]    Family History Family History  Problem Relation Age of Onset   Migraines Mother    Heart disease Father    Migraines Father    Migraines Daughter    Breast cancer Neg Hx     Social History Social History   Tobacco Use   Smoking status: Never   Smokeless tobacco: Never  Vaping Use   Vaping status: Never Used  Substance Use Topics   Alcohol use: No     Alcohol/week: 0.0 standard drinks of alcohol   Drug use: No     Allergies   Penicillins, Atorvastatin calcium, Lipitor [atorvastatin calcium], Mushroom extract complex (obsolete), Spinach, and Zithromax [azithromycin]   Review of Systems Review of Systems  Constitutional:  Negative for chills and fever.  HENT:  Positive for ear pain. Negative for sore throat.  Eyes:  Negative for pain and visual disturbance.  Respiratory:  Negative for cough and shortness of breath.   Cardiovascular:  Negative for chest pain and palpitations.  Gastrointestinal:  Negative for abdominal pain and vomiting.  Genitourinary:  Negative for dysuria and hematuria.  Musculoskeletal:  Negative for arthralgias and back pain.  Skin:  Negative for color change and rash.  Neurological:  Negative for seizures and syncope.  All other systems reviewed and are negative.    Physical Exam Triage Vital Signs ED Triage Vitals [02/01/24 1041]  Encounter Vitals Group     BP 125/75     Girls Systolic BP Percentile      Girls Diastolic BP Percentile      Boys Systolic BP Percentile      Boys Diastolic BP Percentile      Pulse Rate 88     Resp 14     Temp 98.3 F (36.8 C)     Temp Source Oral     SpO2 97 %     Weight      Height      Head Circumference      Peak Flow      Pain Score 7     Pain Loc      Pain Education      Exclude from Growth Chart    No data found.  Updated Vital Signs BP 125/75 (BP Location: Right Arm)   Pulse 88   Temp 98.3 F (36.8 C) (Oral)   Resp 14   LMP 01/13/2016 (Exact Date)   SpO2 97%   Visual Acuity Right Eye Distance:   Left Eye Distance:   Bilateral Distance:    Right Eye Near:   Left Eye Near:    Bilateral Near:     Physical Exam Vitals and nursing note reviewed.  Constitutional:      General: She is not in acute distress.    Appearance: She is well-developed.  HENT:     Head: Normocephalic and atraumatic.     Right Ear: No drainage or tenderness. A  middle ear effusion is present. Tympanic membrane is not perforated or erythematous.  Eyes:     Conjunctiva/sclera: Conjunctivae normal.  Cardiovascular:     Rate and Rhythm: Normal rate and regular rhythm.     Heart sounds: No murmur heard. Pulmonary:     Effort: Pulmonary effort is normal. No respiratory distress.     Breath sounds: Normal breath sounds.  Abdominal:     Palpations: Abdomen is soft.     Tenderness: There is no abdominal tenderness.  Musculoskeletal:        General: No swelling.     Cervical back: Neck supple.  Skin:    General: Skin is warm and dry.     Capillary Refill: Capillary refill takes less than 2 seconds.  Neurological:     Mental Status: She is alert.  Psychiatric:        Mood and Affect: Mood normal.      UC Treatments / Results  Labs (all labs ordered are listed, but only abnormal results are displayed) Labs Reviewed - No data to display  EKG   Radiology No results found.  Procedures Procedures (including critical care time)  Medications Ordered in UC Medications - No data to display  Initial Impression / Assessment and Plan / UC Course  I have reviewed the triage vital signs and the nursing notes.  Pertinent labs & imaging results that were available during my  care of the patient were reviewed by me and considered in my medical decision making (see chart for details).     Dysfunction of right eustachian tube   Symptoms and physical exam findings are most consistent with right eustachian tube dysfunction.  This is not an infection and does not require antibiotics. We can treat this with anti-inflammatories to help reduce the swelling and improve symptoms. We will treat with the following:  Prednisone  40 mg (2 tablets) once daily for 3 days. Take this in the morning.  This is a steroid to help with inflammation and pain.  Can try taking over-the-counter Zyrtec  or Claritin to help with allergies May use Tylenol  but avoid using  ibuprofen  while taking prednisone  Return to urgent care or PCP if symptoms worsen or fail to resolve.    Final Clinical Impressions(s) / UC Diagnoses   Final diagnoses:  Dysfunction of right eustachian tube     Discharge Instructions      Symptoms and physical exam findings are most consistent with right eustachian tube dysfunction.  This is not an infection and does not require antibiotics. We can treat this with anti-inflammatories to help reduce the swelling and improve symptoms. We will treat with the following:  Prednisone  40 mg (2 tablets) once daily for 3 days. Take this in the morning.  This is a steroid to help with inflammation and pain.  Can try taking over-the-counter Zyrtec  or Claritin to help with allergies May use Tylenol  but avoid using ibuprofen  while taking prednisone  Return to urgent care or PCP if symptoms worsen or fail to resolve.      ED Prescriptions     Medication Sig Dispense Auth. Provider   predniSONE  (DELTASONE ) 20 MG tablet Take 2 tablets (40 mg total) by mouth daily with breakfast for 3 days. 6 tablet Teresa Almarie LABOR, PA-C      PDMP not reviewed this encounter.   Teresa Almarie LABOR, [place] 02/01/24 1102

## 2024-02-01 NOTE — Discharge Instructions (Addendum)
 Symptoms and physical exam findings are most consistent with right eustachian tube dysfunction.  This is not an infection and does not require antibiotics. We can treat this with anti-inflammatories to help reduce the swelling and improve symptoms. We will treat with the following:  Prednisone  40 mg (2 tablets) once daily for 3 days. Take this in the morning.  This is a steroid to help with inflammation and pain.  Can try taking over-the-counter Zyrtec  or Claritin to help with allergies May use Tylenol  but avoid using ibuprofen  while taking prednisone  Return to urgent care or PCP if symptoms worsen or fail to resolve.

## 2024-02-01 NOTE — ED Triage Notes (Signed)
 Patient c/o right ear pain x 2 days.  Patient states she has been taking Tylenol  for right ear.

## 2024-02-09 ENCOUNTER — Encounter (HOSPITAL_COMMUNITY): Payer: Self-pay

## 2024-02-09 ENCOUNTER — Ambulatory Visit (HOSPITAL_COMMUNITY)
Admission: RE | Admit: 2024-02-09 | Discharge: 2024-02-09 | Disposition: A | Discharge status: Home / Self Care | Source: Ambulatory Visit | Attending: Emergency Medicine | Admitting: Emergency Medicine

## 2024-02-09 VITALS — BP 119/78 | HR 84 | Temp 98.4°F | Resp 16

## 2024-02-09 DIAGNOSIS — L853 Xerosis cutis: Secondary | ICD-10-CM

## 2024-02-09 DIAGNOSIS — T50905A Adverse effect of unspecified drugs, medicaments and biological substances, initial encounter: Secondary | ICD-10-CM | POA: Diagnosis not present

## 2024-02-09 DIAGNOSIS — L299 Pruritus, unspecified: Secondary | ICD-10-CM

## 2024-02-09 MED ORDER — DEXAMETHASONE SOD PHOSPHATE PF 10 MG/ML IJ SOLN
10.0000 mg | Freq: Once | INTRAMUSCULAR | Status: AC
  Administered 2024-02-09: 10 mg via INTRAMUSCULAR

## 2024-02-09 NOTE — ED Triage Notes (Signed)
 Patient here today with c/o facial irritation and itchy rash on hands and back since Thursday. Patient states that she took something for IBS on Wednesday and thinks that that is what caused it. Patient also used a cleaner call Ajax and states that it burned her hands. Patient has been using Coco butter and Vaseline with some relief.

## 2024-02-09 NOTE — Discharge Instructions (Addendum)
 You are given an injection of Decadron , a steroid, in clinic today to help with your itching. Otherwise take loratadine daily to help with itching. You can continue applying cocoa butter and Vaseline to your hands as this seems to be helping.  Avoid washing your hands under very hot water as this can make this worse. Follow-up with your primary care provider or return here as needed.

## 2024-02-09 NOTE — ED Provider Notes (Signed)
 [facility]    CSN: [id number] Arrival date & time: 02/09/24  1502      History   Chief Complaint Chief Complaint  Patient presents with   Rash    HPI Christine Vega is a 53 y.o. female.   Patient presents with allover body itching and irritation that began shortly after taking an initial dose of Linzess on 10/22.  Patient reports that she has a history of IBS and she was trialing this medication, but began having some mild itching that progressively worsened over the last few days.  Patient is requesting a steroid injection to help with this.  Patient also states that she had a burning rash to her hands after she was using Ajax cleaner without gloves.  Patient states that she has been applying cocoa butter and Vaseline with significant relief of this but continues to have some dryness to her hands.  The history is provided by the patient and medical records.  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, walks/balance ok   Post traumatic stress disorder (PTSD)    Seizures (HCC) 2004   x 1; unknown source   Uterine leiomyoma     Patient Active Problem List   Diagnosis Date Noted   Nasal turbinate hypertrophy 05/14/2017   Iron deficiency anemia due to chronic blood loss 04/05/2016   Cervical high risk HPV (human papillomavirus) test positive, normal cytology on 02/24/14 03/01/2014   Atypical migraine 06/24/2013   Chronic migraine without aura, with intractable migraine, so stated, with status migrainosus 06/24/2013   Vaginal pain 12/01/2010     Past Surgical History:  Procedure Laterality Date   CHOLECYSTECTOMY     HERNIA REPAIR     HYSTERECTOMY ABDOMINAL WITH SALPINGECTOMY Left 02/07/2016   Procedure: HYSTERECTOMY ABDOMINAL WITH  left SALPINGECTOMY;  Surgeon: Winton Felt, MD;  Location: [facility];  Service: Gynecology;  Laterality: Left;   LAPAROSCOPY  05/16/2011   Procedure: LAPAROSCOPY OPERATIVE;  Surgeon: Lynwood Solomons, MD;  Location: [facility];  Service: Gynecology;  Laterality: N/A;  poss oophorectomy   LAPAROSCOPY  08/06/2011   Procedure: LAPAROSCOPY OPERATIVE;  Surgeon: Lynwood KANDICE Solomons, MD;  Location: [facility];  Service: Gynecology;  Laterality: N/A;   LAPAROTOMY     for endometriosis/adhesions   SALPINGOOPHORECTOMY  08/06/2011   Procedure: SALPINGO OOPHERECTOMY;  Surgeon: Lynwood KANDICE Solomons, MD;  Location: [facility];  Service: Gynecology;  Laterality: Right;   SVD     x 2   TUBAL LIGATION     WISDOM TOOTH EXTRACTION      OB History     Gravida  2   Para  2   Term  2   Preterm  0   AB  0   Living  2      SAB  0   IAB  0   Ectopic  0   Multiple  0   Live Births  2            Home Medications    Prior to Admission medications   Medication Sig Start Date End Date Taking? Authorizing Provider  buPROPion  (WELLBUTRIN  XL) 150 MG 24 hr tablet Take 150 mg by mouth daily.    [provider]  fluticasone  (FLONASE ) 50 MCG/ACT nasal spray Place 2 sprays into both nostrils daily. 05/20/23   Rising, Asberry, PA-C  Fremanezumab -vfrm ([name] ) 225 MG/1.5ML SOSY INJECT 225MG  SUBCUTANEOUSLY EVERY 30 DAYS. MUST KEEP FOLLOW UP 12/12/23 FOR ONGOING REFILLS 12/24/23   Ines Onetha NOVAK, MD  gabapentin  (NEURONTIN ) 300 MG capsule Take 3 capsules (900 mg total) by mouth 3 (three) times daily. 12/12/23   Ines Onetha NOVAK, MD  gabapentin  (NEURONTIN ) 300 MG capsule Take 300 mg by mouth 3 (three) times daily.    [provider]  hydrOXYzine  (ATARAX ) 50 MG tablet Take 50 mg by mouth daily. 01/03/23   [provider]  lamoTRIgine (LAMICTAL) 150 MG tablet Take 150 mg by mouth 2 (two) times daily. 01/03/23   [provider]  Lasmiditan  Succinate (REYVOW ) 100 MG TABS Take 1 tablet (100 mg total) by mouth as needed (for migraine). 03/28/23   Athar, Saima, MD  loratadine (CLARITIN) 10 MG tablet Take 10 mg by mouth daily. 01/03/23   [provider]  omeprazole (PRILOSEC) 40 MG capsule Take 40 mg by mouth daily. 11/09/19   [provider]  prochlorperazine (COMPAZINE) 10 MG tablet Take 10 mg by mouth every 6 (six) hours as needed for nausea or vomiting.    [provider]  promethazine  (PHENERGAN ) 50 MG suppository Place 50 mg rectally every 8 (eight) hours as needed for nausea or vomiting.    [provider]  propranolol ER (INDERAL LA) 120 MG 24 hr capsule Take 120 mg by mouth daily.    [provider]  sertraline  (ZOLOFT ) 100 MG tablet Take 100 mg by mouth daily. 07/25/11   [provider]  simvastatin (ZOCOR) 40 MG tablet Take 40 mg by mouth daily. 08/15/19   [provider]  tiZANidine (ZANAFLEX) 4 MG tablet Take 4 mg by mouth every 12 (twelve) hours as needed for muscle spasms.     [provider]  triamcinolone  cream (KENALOG ) 0.1 % Use for facial rash as needed for 1-2 weeks. Patient not taking: Reported on 12/12/2023 09/29/23   Anders Otto DASEN, MD  VRAYLAR 1.5 MG capsule Take 1.5 mg by mouth daily. 01/03/23   [provider]  ZAVZPRET  10 MG/ACT SOLN PLACE 1 SPRAY INTO THE NOSE AS NEEDED (FOR MIGRAINE. MAX DAILY 1 SPRAY IN 24 HOURS) 09/02/23   Onita Duos, MD  zonisamide  (ZONEGRAN ) 100 MG capsule Take 100 mg by mouth daily.    [provider]    Family History Family History  Problem Relation Age of Onset   Migraines Mother    Heart disease Father    Migraines Father    Migraines Daughter    Breast cancer Neg Hx     Social History Social History   Tobacco Use   Smoking status: Never    Smokeless tobacco: Never  Vaping Use   Vaping status: Never Used  Substance Use Topics   Alcohol use: No    Alcohol/week: 0.0 standard drinks of alcohol   Drug use: No     Allergies   Penicillins, Atorvastatin calcium, Linzess [linaclotide], Lipitor [atorvastatin calcium], Mushroom extract complex (obsolete), Spinach, and Zithromax [azithromycin]   Review of Systems Review  of Systems  Skin:  Positive for rash.   Per HPI  Physical Exam Triage Vital Signs ED Triage Vitals  Encounter Vitals Group     BP 02/09/24 1515 119/78     Girls Systolic BP Percentile --      Girls Diastolic BP Percentile --      Boys Systolic BP Percentile --      Boys Diastolic BP Percentile --      Pulse Rate 02/09/24 1515 84     Resp 02/09/24 1515 16     Temp 02/09/24 1515 98.4 F (36.9 C)     Temp Source 02/09/24 1515 Oral     SpO2 02/09/24 1515 98 %     Weight --      Height --      Head Circumference --      Peak Flow --      Pain Score 02/09/24 1511 0     Pain Loc --      Pain Education --      Exclude from Growth Chart --    No data found.  Updated Vital Signs BP 119/78 (BP Location: Right Arm)   Pulse 84   Temp 98.4 F (36.9 C) (Oral)   Resp 16   LMP 01/13/2016 (Exact Date)   SpO2 98%   Visual Acuity Right Eye Distance:   Left Eye Distance:   Bilateral Distance:    Right Eye Near:   Left Eye Near:    Bilateral Near:     Physical Exam Vitals and nursing note reviewed.  Constitutional:      General: She is awake. She is not in acute distress.    Appearance: Normal appearance. She is well-developed and well-groomed. She is not ill-appearing.  Skin:    General: Skin is warm and dry.     Comments: Mild dryness noted to bilateral hands.  Without any rash present to patient's body at this time.  Neurological:     Mental Status: She is alert.  Psychiatric:        Behavior: Behavior is cooperative.      UC Treatments / Results  Labs (all labs ordered are listed, but  only abnormal results are displayed) Labs Reviewed - No data to display  EKG   Radiology No results found.  Procedures Procedures (including critical care time)  Medications Ordered in UC Medications  dexamethasone  (DECADRON ) injection 10 mg (has no administration in time range)    Initial Impression / Assessment and Plan / UC Course  I have reviewed the triage vital signs and the nursing notes.  Pertinent labs & imaging results that were available during my care of the patient were reviewed by me and considered in my medical decision making (see chart for details).     Patient is overall well-appearing.  Vitals are stable.  Given IM Decadron  in clinic per patient request for itching.  Discussed use of over-the-counter antihistamines.  Recommended continuing with cocoa butter and Vaseline for dryness of hands as this is helping.  Discussed follow-up and return precautions. Final Clinical Impressions(s) / UC Diagnoses   Final diagnoses:  Pruritus  Adverse effect of drug, initial encounter  Dry skin dermatitis     Discharge Instructions      You are given an injection of Decadron , a steroid, in clinic today to help with your itching. Otherwise take loratadine daily to help with itching. You can continue applying cocoa butter and Vaseline to your hands as this seems to be  helping.  Avoid washing your hands under very hot water as this can make this worse. Follow-up with your primary care provider or return here as needed.   ED Prescriptions   None    PDMP not reviewed this encounter.   Johnie Flaming A, NP 02/09/24 [phone]

## 2024-02-17 ENCOUNTER — Telehealth: Payer: Self-pay | Admitting: Obstetrics & Gynecology

## 2024-02-17 NOTE — Telephone Encounter (Signed)
 Pt is calling in stating that she is having a rash in between her legs.  Not sure if it came from a change in laundry detergent went from Gain to Arm and Hammer w/Oxy. And has returned back using her Gain and Downey beads.  Pt would like to have a call back to let her know what she needs to do.

## 2024-03-13 ENCOUNTER — Ambulatory Visit (HOSPITAL_COMMUNITY)
Admission: RE | Admit: 2024-03-13 | Discharge: 2024-03-13 | Disposition: A | Discharge status: Home / Self Care | Attending: Physician Assistant | Admitting: Physician Assistant

## 2024-03-13 ENCOUNTER — Ambulatory Visit (HOSPITAL_COMMUNITY): Payer: Self-pay | Admitting: Physician Assistant

## 2024-03-13 ENCOUNTER — Ambulatory Visit (INDEPENDENT_AMBULATORY_CARE_PROVIDER_SITE_OTHER)

## 2024-03-13 ENCOUNTER — Encounter (HOSPITAL_COMMUNITY): Payer: Self-pay

## 2024-03-13 VITALS — BP 121/75 | HR 82 | Temp 98.9°F | Resp 16

## 2024-03-13 DIAGNOSIS — M25562 Pain in left knee: Secondary | ICD-10-CM

## 2024-03-13 MED ORDER — MELOXICAM 15 MG PO TABS: 15.0000 mg | ORAL_TABLET | Freq: Every day | ORAL | 0 refills | Status: AC

## 2024-03-13 NOTE — Discharge Instructions (Signed)
 I saw arthritis but nothing else in your x-ray.  I will contact you if the radiologist sees something else and changes her treatment plan.  Start meloxicam  daily.  Do not take NSAIDs with this medication including aspirin, ibuprofen /Advil , naproxen /Aleve .  You can use Tylenol /acetaminophen  for breakthrough pain.  Use the brace for comfort and support.  Keep your knee elevated and apply ice for 15 minutes at a time 3-4 times a day.  Follow-up with orthopedics; call to schedule an appointment.  If anything worsens please return for reevaluation.

## 2024-03-13 NOTE — ED Triage Notes (Signed)
 States she was walking down stairs yesterday and missed a step and stepped hard. Now having pain in left knee. She was able to ambulate to room without difficulty. She took ibuprofen  earlier today with some relief

## 2024-03-13 NOTE — ED Provider Notes (Signed)
 [facility]    CSN: [id number] Arrival date & time: 03/13/24  1850      History   Chief Complaint Chief Complaint  Patient presents with   Knee Pain    HPI Christine Vega is a 52 y.o. female.   Patient presents today with a 24-hour history of left knee pain.  She reports that she was going down the stairs when she took a misstep causing her to skip a stair and stepped hard on her left leg.  She immediately had left knee pain that has been ongoing since that time.  She reports that pain is rated 7.5/8 on a 0 10 pain scale, localized to left knee, described as aching, no aggravating or alleviating factors identified.  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  She denies any numbness or paresthesias in her foot.  She has taken ibuprofen  with temporary improvement of symptoms.  She is able to ambulate unassisted despite the symptoms.  No concern for pregnancy as she is status post hysterectomy.    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, walks/balance ok   Post traumatic stress disorder (PTSD)    Seizures (HCC) 2004   x 1; unknown source   Uterine leiomyoma     Patient Active Problem List   Diagnosis Date Noted   Nasal turbinate hypertrophy 05/14/2017   Iron deficiency anemia due to chronic blood loss 04/05/2016   Cervical high risk HPV (human papillomavirus) test positive, normal cytology on 02/24/14 03/01/2014   Atypical migraine 06/24/2013    Chronic migraine without aura, with intractable migraine, so stated, with status migrainosus 06/24/2013   Vaginal pain 12/01/2010    Past Surgical History:  Procedure Laterality Date   CHOLECYSTECTOMY     HERNIA REPAIR     HYSTERECTOMY ABDOMINAL WITH SALPINGECTOMY Left 02/07/2016   Procedure: HYSTERECTOMY ABDOMINAL WITH  left SALPINGECTOMY;  Surgeon: Winton Felt, MD;  Location: [facility];  Service: Gynecology;  Laterality: Left;   LAPAROSCOPY  05/16/2011   Procedure: LAPAROSCOPY OPERATIVE;  Surgeon: Lynwood Solomons, MD;  Location: [facility];  Service: Gynecology;  Laterality: N/A;  poss oophorectomy   LAPAROSCOPY  08/06/2011   Procedure: LAPAROSCOPY OPERATIVE;  Surgeon: Lynwood KANDICE Solomons, MD;  Location: [facility];  Service: Gynecology;  Laterality: N/A;   LAPAROTOMY     for endometriosis/adhesions   SALPINGOOPHORECTOMY  08/06/2011   Procedure: SALPINGO OOPHERECTOMY;  Surgeon: Lynwood KANDICE Solomons, MD;  Location: [facility];  Service: Gynecology;  Laterality: Right;   SVD     x 2   TUBAL LIGATION     WISDOM TOOTH EXTRACTION      OB History     Gravida  2   Para  2   Term  2   Preterm  0   AB  0  Living  2      SAB  0   IAB  0   Ectopic  0   Multiple  0   Live Births  2            Home Medications    Prior to Admission medications   Medication Sig Start Date End Date Taking? Authorizing Provider  buPROPion  (WELLBUTRIN  XL) 150 MG 24 hr tablet Take 150 mg by mouth daily.   Yes [provider]  fluticasone  (FLONASE ) 50 MCG/ACT nasal spray Place 2 sprays into both nostrils daily. 05/20/23  Yes Rising, Asberry, PA-C  Fremanezumab -vfrm ([name] ) 225 MG/1.5ML SOSY INJECT 225MG  SUBCUTANEOUSLY EVERY 30 DAYS. MUST KEEP FOLLOW UP 12/12/23 FOR ONGOING REFILLS 12/24/23  Yes Ines Onetha NOVAK, MD  gabapentin  (NEURONTIN ) 300 MG capsule Take 3 capsules (900 mg total) by mouth 3 (three) times daily. 12/12/23  Yes Ines Onetha NOVAK, MD  gabapentin  (NEURONTIN ) 300 MG capsule Take 300 mg by  mouth 3 (three) times daily.   Yes [provider]  hydrOXYzine  (ATARAX ) 50 MG tablet Take 50 mg by mouth daily. 01/03/23  Yes [provider]  lamoTRIgine (LAMICTAL) 150 MG tablet Take 150 mg by mouth 2 (two) times daily. 01/03/23  Yes [provider]  Lasmiditan  Succinate (REYVOW ) 100 MG TABS Take 1 tablet (100 mg total) by mouth as needed (for migraine). 03/28/23  Yes Athar, Saima, MD  loratadine (CLARITIN) 10 MG tablet Take 10 mg by mouth daily. 01/03/23  Yes [provider]  meloxicam  (MOBIC ) 15 MG tablet Take 1 tablet (15 mg total) by mouth daily. 03/13/24  Yes Rhena Glace K, PA-C  omeprazole (PRILOSEC) 40 MG capsule Take 40 mg by mouth daily. 11/09/19  Yes [provider]  prochlorperazine (COMPAZINE) 10 MG tablet Take 10 mg by mouth every 6 (six) hours as needed for nausea or vomiting.   Yes [provider]  promethazine  (PHENERGAN ) 50 MG suppository Place 50 mg rectally every 8 (eight) hours as needed for nausea or vomiting.   Yes [provider]  propranolol ER (INDERAL LA) 120 MG 24 hr capsule Take 120 mg by mouth daily.   Yes [provider]  sertraline  (ZOLOFT ) 100 MG tablet Take 100 mg by mouth daily. 07/25/11  Yes [provider]  simvastatin (ZOCOR) 40 MG tablet Take 40 mg by mouth daily. 08/15/19  Yes [provider]  VRAYLAR 1.5 MG capsule Take 1.5 mg by mouth daily. 01/03/23  Yes [provider]  ZAVZPRET  10 MG/ACT SOLN PLACE 1 SPRAY INTO THE NOSE AS NEEDED (FOR MIGRAINE. MAX DAILY 1 SPRAY IN 24 HOURS) 09/02/23  Yes Onita Duos, MD  zonisamide  (ZONEGRAN ) 100 MG capsule Take 100 mg by mouth daily.   Yes [provider]    Family History Family History  Problem Relation Age of Onset   Migraines Mother    Heart disease Father    Migraines Father    Migraines Daughter    Breast cancer Neg Hx     Social History Social History   Tobacco Use   Smoking status: Never    Smokeless tobacco: Never  Vaping Use   Vaping status: Never Used  Substance Use Topics   Alcohol use: No    Alcohol/week: 0.0 standard drinks of alcohol   Drug use: No     Allergies   Penicillins, Atorvastatin calcium, Linzess [linaclotide], Lipitor [atorvastatin calcium], Mushroom extract complex (obsolete), Spinach, and Zithromax [azithromycin]   Review of Systems Review of Systems  Constitutional:  Positive for activity change. Negative for appetite change, fatigue and fever.  Musculoskeletal:  Positive for arthralgias and gait problem. Negative for joint swelling and myalgias.  Skin:  Negative for color change and wound.  Neurological:  Negative for weakness and numbness.     Physical Exam Triage Vital Signs ED Triage Vitals  Encounter Vitals Group     BP 03/13/24 1906 121/75     Girls Systolic BP Percentile --      Girls Diastolic BP Percentile --      Boys Systolic BP Percentile --      Boys Diastolic BP Percentile --      Pulse Rate 03/13/24 1906 82     Resp 03/13/24 1906 16     Temp 03/13/24 1906 98.9 F (37.2 C)     Temp src --      SpO2 03/13/24 1906 98 %     Weight --      Height --      Head Circumference --      Peak Flow --      Pain Score 03/13/24 1903 8     Pain Loc --      Pain Education --      Exclude from Growth Chart --    No data found.  Updated Vital Signs BP 121/75 (BP Location: Right Arm)   Pulse 82   Temp 98.9 F (37.2 C)   Resp 16   LMP 01/13/2016 (Exact Date)   SpO2 98%   Visual Acuity Right Eye Distance:   Left Eye Distance:   Bilateral Distance:    Right Eye Near:   Left Eye Near:    Bilateral Near:     Physical Exam Vitals reviewed.  Constitutional:      General: She is awake. She is not in acute distress.    Appearance: Normal appearance. She is well-developed. She is not ill-appearing.     Comments: Very pleasant female appears stated age in no acute distress sitting comfortably in exam room  HENT:     Head:  Normocephalic and atraumatic.  Cardiovascular:     Rate and Rhythm: Normal rate and regular rhythm.     Pulses:          Posterior tibial pulses are 2+ on the left side.     Heart sounds: Normal heart sounds, S1 normal and S2 normal. No murmur heard. Pulmonary:     Effort: Pulmonary effort is normal.     Breath sounds: Normal breath sounds. No wheezing, rhonchi or rales.     Comments: Clear to auscultation bilaterally Musculoskeletal:     Right knee: No swelling or effusion. No LCL laxity, MCL laxity, ACL laxity or PCL laxity.     Left knee: Swelling present. No effusion. No LCL laxity, MCL laxity, ACL laxity or PCL laxity.    Instability Tests: Anterior drawer test negative. Posterior drawer test negative.     Right lower leg: No edema.     Left lower leg: No edema.     Comments: Left knee: Tenderness palpation over your knee/patella with associated swelling.  No ligamentous laxity on exam.  Strength 5/5 bilateral lower extremities.  Antalgic gait.  Foot neurovascular intact  Psychiatric:        Behavior: Behavior is cooperative.      UC Treatments / Results  Labs (all labs ordered are listed, but only abnormal results are displayed) Labs Reviewed - No data to display  EKG   Radiology  DG Knee AP/LAT W/Sunrise Left Result Date: 03/13/2024 EXAM: 3 VIEW(S) XRAY OF THE KNEE 03/13/2024 07:58:23 PM COMPARISON: None available. CLINICAL HISTORY: pain and swelling after injury FINDINGS: BONES AND JOINTS: No acute fracture. No focal osseous lesion. No joint dislocation. No significant joint effusion. Small superior patellar spur. Medial and patellofemoral degenerative changes with narrowing and osteophyte. SOFT TISSUES: The soft tissues are unremarkable. IMPRESSION: 1. No acute fracture or dislocation. Electronically signed by: Norman Gatlin MD 03/13/2024 08:17 PM EST RP Workstation: [id number]    Procedures Procedures (including critical care time)  Medications Ordered in  UC Medications - No data to display  Initial Impression / Assessment and Plan / UC Course  I have reviewed the triage vital signs and the nursing notes.  Pertinent labs & imaging results that were available during my care of the patient were reviewed by me and considered in my medical decision making (see chart for details).     Patient is well-appearing, afebrile, nontoxic, nontachycardic.  Physical exam is reassuring.  X-ray was obtained given her bony tenderness that showed no acute osseous abnormality.  She was placed in a brace for comfort and support and encouraged to use RICE protocol.  Will start Mobic  daily to help manage discomfort.  No indication for dose adjustment based on metabolic panel from 11/03/2023 with creatinine of 0.71 and calculated creatinine clearance of 114 mL/minute.  Recommend close follow-up with orthopedics and was given the contact information for local provider with instruction to call to schedule an appointment.  We discussed that if she has any worsening or changing symptoms she needs to be seen immediately.  Strict return precautions given.  Excuse note provided.  Final Clinical Impressions(s) / UC Diagnoses   Final diagnoses:  Acute pain of left knee     Discharge Instructions      I saw arthritis but nothing else in your x-ray.  I will contact you if the radiologist sees something else and changes her treatment plan.  Start meloxicam  daily.  Do not take NSAIDs with this medication including aspirin, ibuprofen /Advil , naproxen /Aleve .  You can use Tylenol /acetaminophen  for breakthrough pain.  Use the brace for comfort and support.  Keep your knee elevated and apply ice for 15 minutes at a time 3-4 times a day.  Follow-up with orthopedics; call to schedule an appointment.  If anything worsens please return for reevaluation.     ED Prescriptions     Medication Sig Dispense Auth. Provider   meloxicam  (MOBIC ) 15 MG tablet Take 1 tablet (15 mg total) by mouth  daily. 14 tablet Hargun Spurling K, PA-C      PDMP not reviewed this encounter.   Sherrell Rocky POUR, PA-C 03/13/24 2026

## 2024-03-29 ENCOUNTER — Ambulatory Visit (HOSPITAL_COMMUNITY)

## 2024-03-30 ENCOUNTER — Ambulatory Visit (HOSPITAL_COMMUNITY)

## 2024-04-22 ENCOUNTER — Ambulatory Visit (HOSPITAL_COMMUNITY)

## 2024-04-24 ENCOUNTER — Ambulatory Visit (HOSPITAL_COMMUNITY)

## 2024-06-17 ENCOUNTER — Ambulatory Visit: Admitting: Neurology
